# Patient Record
Sex: Female | Born: 1960
Health system: Southern US, Community
[De-identification: ages and names within clinical notes are randomized; demographics above are authoritative.]

## PROBLEM LIST (undated history)

## (undated) DIAGNOSIS — J069 Acute upper respiratory infection, unspecified: Secondary | ICD-10-CM

## (undated) DIAGNOSIS — F419 Anxiety disorder, unspecified: Secondary | ICD-10-CM

## (undated) DIAGNOSIS — R002 Palpitations: Secondary | ICD-10-CM

## (undated) DIAGNOSIS — Z87442 Personal history of urinary calculi: Secondary | ICD-10-CM

## (undated) DIAGNOSIS — Z9109 Other allergy status, other than to drugs and biological substances: Secondary | ICD-10-CM

## (undated) DIAGNOSIS — I809 Phlebitis and thrombophlebitis of unspecified site: Secondary | ICD-10-CM

## (undated) DIAGNOSIS — O2230 Deep phlebothrombosis in pregnancy, unspecified trimester: Secondary | ICD-10-CM

## (undated) DIAGNOSIS — G40909 Epilepsy, unspecified, not intractable, without status epilepticus: Secondary | ICD-10-CM

## (undated) DIAGNOSIS — R51 Headache: Secondary | ICD-10-CM

## (undated) DIAGNOSIS — K219 Gastro-esophageal reflux disease without esophagitis: Secondary | ICD-10-CM

## (undated) DIAGNOSIS — F32A Depression, unspecified: Secondary | ICD-10-CM

## (undated) DIAGNOSIS — Z973 Presence of spectacles and contact lenses: Secondary | ICD-10-CM

## (undated) DIAGNOSIS — F329 Major depressive disorder, single episode, unspecified: Secondary | ICD-10-CM

## (undated) DIAGNOSIS — M1990 Unspecified osteoarthritis, unspecified site: Secondary | ICD-10-CM

## (undated) DIAGNOSIS — F4310 Post-traumatic stress disorder, unspecified: Secondary | ICD-10-CM

## (undated) DIAGNOSIS — G5603 Carpal tunnel syndrome, bilateral upper limbs: Secondary | ICD-10-CM

## (undated) DIAGNOSIS — M549 Dorsalgia, unspecified: Secondary | ICD-10-CM

## (undated) DIAGNOSIS — G8929 Other chronic pain: Secondary | ICD-10-CM

## (undated) DIAGNOSIS — R5383 Other fatigue: Secondary | ICD-10-CM

## (undated) DIAGNOSIS — I341 Nonrheumatic mitral (valve) prolapse: Secondary | ICD-10-CM

## (undated) DIAGNOSIS — I319 Disease of pericardium, unspecified: Secondary | ICD-10-CM

## (undated) DIAGNOSIS — J189 Pneumonia, unspecified organism: Secondary | ICD-10-CM

## (undated) DIAGNOSIS — M4807 Spinal stenosis, lumbosacral region: Secondary | ICD-10-CM

## (undated) DIAGNOSIS — R519 Headache, unspecified: Secondary | ICD-10-CM

## (undated) DIAGNOSIS — L509 Urticaria, unspecified: Secondary | ICD-10-CM

## (undated) HISTORY — PX: TUBAL LIGATION: SHX77

## (undated) HISTORY — PX: OVARY SURGERY: SHX727

## (undated) HISTORY — DX: Post-traumatic stress disorder, unspecified: F43.10

## (undated) HISTORY — DX: Anxiety disorder, unspecified: F41.9

## (undated) HISTORY — DX: Depression, unspecified: F32.A

## (undated) HISTORY — DX: Disease of pericardium, unspecified: I31.9

## (undated) HISTORY — PX: NOSE SURGERY: SHX723

## (undated) HISTORY — DX: Urticaria, unspecified: L50.9

## (undated) HISTORY — PX: UTERINE FIBROID SURGERY: SHX826

## (undated) HISTORY — PX: APPENDECTOMY: SHX54

## (undated) HISTORY — PX: RHINOPLASTY: SUR1284

## (undated) HISTORY — DX: Acute upper respiratory infection, unspecified: J06.9

## (undated) HISTORY — PX: BREAST LUMPECTOMY: SHX2

## (undated) HISTORY — DX: Other fatigue: R53.83

## (undated) HISTORY — PX: ABDOMINAL HYSTERECTOMY: SHX81

## (undated) HISTORY — PX: BIOPSY BREAST: PRO8

## (undated) HISTORY — DX: Major depressive disorder, single episode, unspecified: F32.9

---

## 2006-12-12 ENCOUNTER — Encounter: Payer: Self-pay | Admitting: Family Medicine

## 2011-02-04 ENCOUNTER — Encounter: Payer: Self-pay | Admitting: *Deleted

## 2011-02-04 ENCOUNTER — Emergency Department (HOSPITAL_COMMUNITY)
Admission: EM | Admit: 2011-02-04 | Discharge: 2011-02-05 | Disposition: A | Discharge status: Home / Self Care | Payer: Self-pay | Attending: Emergency Medicine | Admitting: Emergency Medicine

## 2011-02-04 DIAGNOSIS — K5900 Constipation, unspecified: Secondary | ICD-10-CM | POA: Insufficient documentation

## 2011-02-04 DIAGNOSIS — R0602 Shortness of breath: Secondary | ICD-10-CM | POA: Insufficient documentation

## 2011-02-04 DIAGNOSIS — R062 Wheezing: Secondary | ICD-10-CM | POA: Insufficient documentation

## 2011-02-04 DIAGNOSIS — F17200 Nicotine dependence, unspecified, uncomplicated: Secondary | ICD-10-CM | POA: Insufficient documentation

## 2011-02-04 DIAGNOSIS — R112 Nausea with vomiting, unspecified: Secondary | ICD-10-CM | POA: Insufficient documentation

## 2011-02-04 DIAGNOSIS — R05 Cough: Secondary | ICD-10-CM | POA: Insufficient documentation

## 2011-02-04 DIAGNOSIS — R51 Headache: Secondary | ICD-10-CM | POA: Insufficient documentation

## 2011-02-04 DIAGNOSIS — R059 Cough, unspecified: Secondary | ICD-10-CM | POA: Insufficient documentation

## 2011-02-04 DIAGNOSIS — J40 Bronchitis, not specified as acute or chronic: Secondary | ICD-10-CM | POA: Insufficient documentation

## 2011-02-04 DIAGNOSIS — R509 Fever, unspecified: Secondary | ICD-10-CM | POA: Insufficient documentation

## 2011-02-04 NOTE — ED Notes (Signed)
Headache since this am with vomiting and an elevated temp.she had advil 1930

## 2011-02-05 ENCOUNTER — Emergency Department (HOSPITAL_COMMUNITY): Payer: Self-pay

## 2011-02-05 LAB — DIFFERENTIAL
Basophils Relative: 0 % (ref 0–1)
Monocytes Relative: 12 % (ref 3–12)
Neutro Abs: 7.4 10*3/uL (ref 1.7–7.7)
Neutrophils Relative %: 73 % (ref 43–77)

## 2011-02-05 LAB — CBC
Hemoglobin: 12.2 g/dL (ref 12.0–15.0)
MCHC: 34.5 g/dL (ref 30.0–36.0)
RBC: 3.71 MIL/uL — ABNORMAL LOW (ref 3.87–5.11)
WBC: 10.1 10*3/uL (ref 4.0–10.5)

## 2011-02-05 MED ORDER — IPRATROPIUM BROMIDE 0.02 % IN SOLN
0.5000 mg | Freq: Once | RESPIRATORY_TRACT | Status: AC
  Administered 2011-02-05: 0.5 mg via RESPIRATORY_TRACT
  Filled 2011-02-05: qty 2.5

## 2011-02-05 MED ORDER — ALBUTEROL SULFATE HFA 108 (90 BASE) MCG/ACT IN AERS
2.0000 | INHALATION_SPRAY | RESPIRATORY_TRACT | Status: DC | PRN
  Administered 2011-02-05: 2 via RESPIRATORY_TRACT
  Filled 2011-02-05: qty 6.7

## 2011-02-05 MED ORDER — SODIUM CHLORIDE 0.9 % IV BOLUS (SEPSIS)
1000.0000 mL | Freq: Once | INTRAVENOUS | Status: AC
  Administered 2011-02-05: 1000 mL via INTRAVENOUS

## 2011-02-05 MED ORDER — AZITHROMYCIN 250 MG PO TABS: ORAL_TABLET | ORAL | Status: AC

## 2011-02-05 MED ORDER — ALBUTEROL SULFATE (5 MG/ML) 0.5% IN NEBU
5.0000 mg | INHALATION_SOLUTION | Freq: Once | RESPIRATORY_TRACT | Status: AC
  Administered 2011-02-05: 5 mg via RESPIRATORY_TRACT
  Filled 2011-02-05: qty 1

## 2011-02-05 MED ORDER — ONDANSETRON HCL 4 MG/2ML IJ SOLN
4.0000 mg | Freq: Once | INTRAMUSCULAR | Status: AC
  Administered 2011-02-05: 4 mg via INTRAVENOUS
  Filled 2011-02-05: qty 2

## 2011-02-05 NOTE — ED Provider Notes (Signed)
History     CSN: [id number]  Arrival date & time 02/04/11  2233   First MD Initiated Contact with Patient 02/05/11 0134      Chief Complaint  Patient presents with  . Emesis     HPI  History provided by the patient. Patient is a 50 year old female with history of mitral valve prolapse who presents with complaints of gradually increasing cough, shortness of breath, headache that began earlier yesterday morning. Symptoms were associated with some episodes of nausea and vomiting. Fever of 101 it was improved with Advil. She states she has been unable to keep down any food or fluids. She has not taken any medications for her symptoms besides 1 Advil. She does not know of any sick contacts. She has no recent travel or long trips. She has no hemoptysis. She has no other significant past medical history.   Past Medical History  Diagnosis Date  . Mild mitral valve prolapse     History reviewed. No pertinent past surgical history.  History reviewed. No pertinent family history.  History  Substance Use Topics  . Smoking status: Current Everyday Smoker  . Smokeless tobacco: Not on file  . Alcohol Use: Yes    OB History    Grav Para Term Preterm Abortions TAB SAB Ect Mult Living                  Review of Systems  Constitutional: Positive for fever and chills.  HENT: Negative for congestion and rhinorrhea.   Respiratory: Positive for cough and shortness of breath.   Cardiovascular: Negative for chest pain.  Gastrointestinal: Positive for nausea, vomiting and constipation. Negative for abdominal pain and diarrhea.  Neurological: Positive for headaches.  All other systems reviewed and are negative.    Allergies  Aspirin; Dilantin; Food; Ivp dye; and Sulfa drugs cross reactors  Home Medications   Current Outpatient Rx  Name Route Sig Dispense Refill  . ALPRAZOLAM 1 MG PO TABS Oral Take 1 mg by mouth 3 (three) times daily. To slow down heart rate and calm nerves     .  HYDROCODONE-ACETAMINOPHEN 10-325 MG PO TABS Oral Take 1 tablet by mouth every 6 (six) hours as needed. For pain     . METOPROLOL SUCCINATE ER 50 MG PO TB24 Oral Take 50 mg by mouth daily. To treat heart failure condition and rapid heart beat       BP 82/51  Pulse 80  Temp(Src) 97.6 F (36.4 C) (Oral)  Resp 18  SpO2 98%  Physical Exam  Nursing note and vitals reviewed. Constitutional: She is oriented to person, place, and time. She appears well-developed and well-nourished. No distress.  HENT:  Head: Normocephalic and atraumatic.  Mouth/Throat: Oropharynx is clear and moist.  Eyes: Conjunctivae and EOM are normal. Pupils are equal, round, and reactive to light.  Neck: Normal range of motion. Neck supple.       No meningeal signs  Cardiovascular: Normal rate and regular rhythm.   No murmur heard. Pulmonary/Chest: Effort normal. No respiratory distress. She has wheezes. She has no rales. She exhibits no tenderness.  Abdominal: Soft. She exhibits no distension. There is no tenderness. There is no rebound and no guarding.  Musculoskeletal: She exhibits no edema and no tenderness.  Lymphadenopathy:    She has no cervical adenopathy.  Neurological: She is alert and oriented to person, place, and time.  Skin: Skin is warm and dry. No rash noted.  Psychiatric: She has a normal mood and  affect. Her behavior is normal.    ED Course  Procedures (including critical care time)   Labs Reviewed  CBC  DIFFERENTIAL  I-STAT, CHEM 8   Results for orders placed during the hospital encounter of 02/04/11  CBC      Component Value Range   WBC 10.1  4.0 - 10.5 (K/uL)   RBC 3.71 (*) 3.87 - 5.11 (MIL/uL)   Hemoglobin 12.2  12.0 - 15.0 (g/dL)   HCT 41.3 (*) 24.4 - 46.0 (%)   MCV 95.4  78.0 - 100.0 (fL)   MCH 32.9  26.0 - 34.0 (pg)   MCHC 34.5  30.0 - 36.0 (g/dL)   RDW 01.0  27.2 - 53.6 (%)   Platelets 157  150 - 400 (K/uL)  DIFFERENTIAL      Component Value Range   Neutrophils Relative 73   43 - 77 (%)   Neutro Abs 7.4  1.7 - 7.7 (K/uL)   Lymphocytes Relative 14  12 - 46 (%)   Lymphs Abs 1.5  0.7 - 4.0 (K/uL)   Monocytes Relative 12  3 - 12 (%)   Monocytes Absolute 1.2 (*) 0.1 - 1.0 (K/uL)   Eosinophils Relative 0  0 - 5 (%)   Eosinophils Absolute 0.0  0.0 - 0.7 (K/uL)   Basophils Relative 0  0 - 1 (%)   Basophils Absolute 0.0  0.0 - 0.1 (K/uL)    I-STAT results did not crossover in the computer. Results are as follows:  Sodium 139, potassium 3.6, chloride 106, calcium 1.10, CO2 25, glucose 113, BUN 13, creatinine 0.8, hematocrit 37%, hemoglobin 12.6    Dg Chest 2 View  02/05/2011  *RADIOLOGY REPORT*  Clinical Data: Cough.  Fever.  Rule out pneumonia  CHEST - 2 VIEW  Comparison: None.  Findings: Heart size is normal.  No pleural effusion or pulmonary edema.  No airspace consolidation identified.  Mildly coarsened interstitial markings noted.  Review of the visualized osseous structures is negative.  IMPRESSION:  1.  No acute cardiopulmonary abnormalities.  Original Report Authenticated By: Rosealee Albee, M.D.     1. Bronchitis       MDM  1:30 AM patient seen and evaluated. Patient in no acute distress.   2:30 AM patient reports feeling much better after breathing treatment. She still has slight wheezing but noticeable improvement.   Patient tolerating by mouth fluids without nausea or vomiting. Patient reports that she has a baseline low blood pressures with systolic pressure is usually around 98. She denies any lightheaded or having near syncope.   Phill Mutter Friend, [place] 02/05/11 [phone]

## 2011-02-05 NOTE — ED Provider Notes (Signed)
Medical screening examination/treatment/procedure(s) were performed by non-physician practitioner and as supervising physician I was immediately available for consultation/collaboration.   Lyanne Co, MD 02/05/11 [phone]

## 2011-02-05 NOTE — ED Notes (Addendum)
Pt stated that she has been having Chest tightness, n/v since this morning. No signs of respiratory or cardiac distress at this time. Complaining of nausea. Lung sounds clear. Heart sounds heard and WNL.

## 2011-02-05 NOTE — ED Notes (Signed)
MD made aware of SBP being in 80s. Bolus given per orders. Pt given  Water for fluid challenge given. No nausea/vomiting at this time. Will continue to monitor.

## 2011-02-05 NOTE — ED Notes (Signed)
Report given to [facility] in [place]. No questions or concerns by RN at this time.

## 2011-02-08 LAB — POCT I-STAT, CHEM 8
Chloride: 106 mEq/L (ref 96–112)
Glucose, Bld: 113 mg/dL — ABNORMAL HIGH (ref 70–99)
HCT: 37 % (ref 36.0–46.0)
Potassium: 3.6 mEq/L (ref 3.5–5.1)

## 2011-03-02 ENCOUNTER — Emergency Department (INDEPENDENT_AMBULATORY_CARE_PROVIDER_SITE_OTHER)
Admission: EM | Admit: 2011-03-02 | Discharge: 2011-03-02 | Disposition: A | Discharge status: Home / Self Care | Payer: Self-pay | Source: Home / Self Care | Attending: Family Medicine | Admitting: Family Medicine

## 2011-03-02 ENCOUNTER — Encounter (HOSPITAL_COMMUNITY): Payer: Self-pay | Admitting: Emergency Medicine

## 2011-03-02 DIAGNOSIS — M549 Dorsalgia, unspecified: Secondary | ICD-10-CM

## 2011-03-02 DIAGNOSIS — J45901 Unspecified asthma with (acute) exacerbation: Secondary | ICD-10-CM

## 2011-03-02 DIAGNOSIS — I341 Nonrheumatic mitral (valve) prolapse: Secondary | ICD-10-CM

## 2011-03-02 DIAGNOSIS — F17210 Nicotine dependence, cigarettes, uncomplicated: Secondary | ICD-10-CM

## 2011-03-02 DIAGNOSIS — I059 Rheumatic mitral valve disease, unspecified: Secondary | ICD-10-CM

## 2011-03-02 DIAGNOSIS — G8929 Other chronic pain: Secondary | ICD-10-CM

## 2011-03-02 DIAGNOSIS — F17200 Nicotine dependence, unspecified, uncomplicated: Secondary | ICD-10-CM

## 2011-03-02 HISTORY — DX: Phlebitis and thrombophlebitis of unspecified site: I80.9

## 2011-03-02 HISTORY — DX: Dorsalgia, unspecified: M54.9

## 2011-03-02 HISTORY — DX: Other chronic pain: G89.29

## 2011-03-02 MED ORDER — HYDROCODONE-ACETAMINOPHEN 10-325 MG PO TABS: 1.0000 | ORAL_TABLET | Freq: Two times a day (BID) | ORAL | Status: DC

## 2011-03-02 MED ORDER — ALPRAZOLAM 1 MG PO TABS: 1.0000 mg | ORAL_TABLET | Freq: Three times a day (TID) | ORAL | Status: DC | PRN

## 2011-03-02 MED ORDER — METOPROLOL SUCCINATE ER 25 MG PO TB24: 50.0000 mg | ORAL_TABLET | Freq: Every day | ORAL | Status: DC

## 2011-03-02 MED ORDER — ALBUTEROL SULFATE HFA 108 (90 BASE) MCG/ACT IN AERS: 1.0000 | INHALATION_SPRAY | Freq: Four times a day (QID) | RESPIRATORY_TRACT | Status: DC | PRN

## 2011-03-02 NOTE — ED Notes (Signed)
Arrived in November from [place].  Reports has contacted her physician in [place], but unable to call in medicines.  Patient has been to irc center .  Patient does not have pcp locally

## 2011-03-02 NOTE — ED Provider Notes (Signed)
History     CSN: [id number]  Arrival date & time 03/02/11  1104   First MD Initiated Contact with Patient 03/02/11 1138      Chief Complaint  Patient presents with  . Medication Refill    (Consider location/radiation/quality/duration/timing/severity/associated sxs/prior treatment) Patient is a 51 y.o. female presenting with back pain. The history is provided by the patient.  Back Pain  This is a chronic problem. The problem occurs constantly. The pain is associated with no known injury. The quality of the pain is described as shooting. The pain does not radiate. The pain is moderate.    Past Medical History  Diagnosis Date  . Mild mitral valve prolapse   . Asthma   . Back pain, chronic   . Thrombophlebitis     Past Surgical History  Procedure Date  . Back surgery   . Nose surgery     s/p trauma  . Appendectomy   . Cesarean section   . Ovary surgery   . Uterine fibroid surgery   . Breast lumpectomy     No family history on file.  History  Substance Use Topics  . Smoking status: Current Everyday Smoker  . Smokeless tobacco: Not on file  . Alcohol Use: No    OB History    Grav Para Term Preterm Abortions TAB SAB Ect Mult Living                  Review of Systems  Cardiovascular: Positive for palpitations.  Musculoskeletal: Positive for back pain.  Psychiatric/Behavioral: The patient is nervous/anxious.     Allergies  Aspirin; Dilantin; Food; Ivp dye; and Sulfa drugs cross reactors  Home Medications   Current Outpatient Rx  Name Route Sig Dispense Refill  . VENTOLIN HFA IN Inhalation Inhale into the lungs.    Marland Kitchen METOPROLOL SUCCINATE ER 50 MG PO TB24 Oral Take 50 mg by mouth daily. To treat heart failure condition and rapid heart beat     . ALBUTEROL SULFATE HFA 108 (90 BASE) MCG/ACT IN AERS Inhalation Inhale 1-2 puffs into the lungs every 6 (six) hours as needed for wheezing. 1 Inhaler 0  . ALPRAZOLAM 1 MG PO TABS Oral Take 1 mg by mouth 3 (three)  times daily. To slow down heart rate and calm nerves     . ALPRAZOLAM 1 MG PO TABS Oral Take 1 tablet (1 mg total) by mouth 3 (three) times daily as needed for sleep. 30 tablet 0  . HYDROCODONE-ACETAMINOPHEN 10-325 MG PO TABS Oral Take 1 tablet by mouth 2 times daily at 12 noon and 4 pm. For pain    . HYDROCODONE-ACETAMINOPHEN 10-325 MG PO TABS Oral Take 1 tablet by mouth 2 times daily at 12 noon and 4 pm. 20 tablet 0  . METOPROLOL SUCCINATE ER 25 MG PO TB24 Oral Take 2 tablets (50 mg total) by mouth daily. 60 tablet 0    BP 101/68  Pulse 78  Temp(Src) 97.8 F (36.6 C) (Oral)  Resp 18  SpO2 100%  Physical Exam  Nursing note and vitals reviewed. Constitutional: She is oriented to person, place, and time. She appears well-developed and well-nourished.  Cardiovascular: Normal rate, regular rhythm and intact distal pulses.   Neurological: She is alert and oriented to person, place, and time.  Skin: Skin is warm and dry.  Psychiatric: She has a normal mood and affect.    ED Course  Procedures (including critical care time)  Labs Reviewed - No data to display  No results found.   1. Chronic back pain   2. Mitral valve prolapse syndrome   3. Cigarette smoker   4. Asthma exacerbation, mild       MDM          Barkley Bruns, MD 03/02/11 1301

## 2011-03-11 ENCOUNTER — Emergency Department (INDEPENDENT_AMBULATORY_CARE_PROVIDER_SITE_OTHER)
Admission: EM | Admit: 2011-03-11 | Discharge: 2011-03-11 | Disposition: A | Discharge status: Home / Self Care | Payer: Self-pay | Source: Home / Self Care

## 2011-03-11 ENCOUNTER — Encounter (HOSPITAL_COMMUNITY): Payer: Self-pay | Admitting: Emergency Medicine

## 2011-03-11 DIAGNOSIS — I059 Rheumatic mitral valve disease, unspecified: Secondary | ICD-10-CM

## 2011-03-11 DIAGNOSIS — M545 Low back pain: Secondary | ICD-10-CM

## 2011-03-11 DIAGNOSIS — I341 Nonrheumatic mitral (valve) prolapse: Secondary | ICD-10-CM

## 2011-03-11 DIAGNOSIS — G8929 Other chronic pain: Secondary | ICD-10-CM

## 2011-03-11 MED ORDER — ALPRAZOLAM 1 MG PO TABS
1.0000 mg | ORAL_TABLET | Freq: Three times a day (TID) | ORAL | Status: DC | PRN
Start: 2011-03-11 — End: 2011-03-17

## 2011-03-11 MED ORDER — HYDROCODONE-ACETAMINOPHEN 10-325 MG PO TABS: 1.0000 | ORAL_TABLET | Freq: Two times a day (BID) | ORAL | Status: DC

## 2011-03-11 NOTE — ED Notes (Signed)
Patient needing refills of medication.  Patient will not get orange card until April 07, 2011.
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
## 2011-03-12 NOTE — ED Provider Notes (Signed)
Medical screening examination/treatment/procedure(s) were performed by non-physician practitioner and as supervising physician I was immediately available for consultation/collaboration.  Luiz Blare MD   Luiz Blare, MD 03/12/11 [phone]

## 2011-03-17 ENCOUNTER — Ambulatory Visit (INDEPENDENT_AMBULATORY_CARE_PROVIDER_SITE_OTHER): Payer: Self-pay | Admitting: Family Medicine

## 2011-03-17 ENCOUNTER — Ambulatory Visit: Payer: Self-pay

## 2011-03-17 ENCOUNTER — Emergency Department (HOSPITAL_COMMUNITY): Admission: EM | Admit: 2011-03-17 | Discharge: 2011-03-17 | Discharge status: Against Medical Advice | Payer: Self-pay

## 2011-03-17 VITALS — BP 102/70 | HR 74 | Temp 98.2°F | Resp 16 | Ht 63.0 in | Wt 141.0 lb

## 2011-03-17 DIAGNOSIS — N6049 Mammary duct ectasia of unspecified breast: Secondary | ICD-10-CM

## 2011-03-17 DIAGNOSIS — M545 Low back pain: Secondary | ICD-10-CM

## 2011-03-17 DIAGNOSIS — N23 Unspecified renal colic: Secondary | ICD-10-CM

## 2011-03-17 DIAGNOSIS — G8929 Other chronic pain: Secondary | ICD-10-CM

## 2011-03-17 DIAGNOSIS — R000 Tachycardia, unspecified: Secondary | ICD-10-CM

## 2011-03-17 MED ORDER — CEPHALEXIN 500 MG PO CAPS: 500.0000 mg | ORAL_CAPSULE | Freq: Three times a day (TID) | ORAL | Status: AC

## 2011-03-17 MED ORDER — HYDROCODONE-ACETAMINOPHEN 10-325 MG PO TABS: 1.0000 | ORAL_TABLET | Freq: Two times a day (BID) | ORAL | Status: AC

## 2011-03-17 MED ORDER — METOPROLOL SUCCINATE ER 50 MG PO TB24: 50.0000 mg | ORAL_TABLET | Freq: Every day | ORAL | Status: DC

## 2011-03-17 MED ORDER — ALPRAZOLAM 1 MG PO TABS: 1.0000 mg | ORAL_TABLET | Freq: Three times a day (TID) | ORAL | Status: DC | PRN
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
## 2011-03-17 NOTE — Progress Notes (Signed)
  Subjective:    Patient ID: Sylvia Hall, female    DOB: [birth year], 51 y.o.   MRN: [id number]  HPI    Review of Systems     Objective:   Physical Exam        Assessment & Plan:  refils - see rxs  URI - likely viral  Periductal mastitis - keflex 500 TID for 10 days.  If does not resolve, must return for mammo/ultrasound.

## 2011-04-14 ENCOUNTER — Ambulatory Visit (INDEPENDENT_AMBULATORY_CARE_PROVIDER_SITE_OTHER): Payer: Self-pay | Admitting: Internal Medicine

## 2011-04-14 ENCOUNTER — Ambulatory Visit: Payer: Self-pay

## 2011-04-14 VITALS — BP 100/66 | HR 85 | Temp 98.1°F | Resp 16 | Ht 63.0 in | Wt 144.8 lb

## 2011-04-14 DIAGNOSIS — J019 Acute sinusitis, unspecified: Secondary | ICD-10-CM

## 2011-04-14 DIAGNOSIS — R05 Cough: Secondary | ICD-10-CM

## 2011-04-14 DIAGNOSIS — F419 Anxiety disorder, unspecified: Secondary | ICD-10-CM

## 2011-04-14 DIAGNOSIS — J42 Unspecified chronic bronchitis: Secondary | ICD-10-CM

## 2011-04-14 DIAGNOSIS — M549 Dorsalgia, unspecified: Secondary | ICD-10-CM

## 2011-04-14 DIAGNOSIS — I341 Nonrheumatic mitral (valve) prolapse: Secondary | ICD-10-CM

## 2011-04-14 LAB — POCT CBC
Granulocyte percent: 57.3 %G (ref 37–80)
HCT, POC: 40.2 % (ref 37.7–47.9)
MID (cbc): 0.4 (ref 0–0.9)
MPV: 8.3 fL (ref 0–99.8)
POC LYMPH PERCENT: 35.6 %L (ref 10–50)
WBC: 5.7 10*3/uL (ref 4.6–10.2)

## 2011-04-14 MED ORDER — ALPRAZOLAM 1 MG PO TABS: 1.0000 mg | ORAL_TABLET | Freq: Three times a day (TID) | ORAL | Status: DC | PRN

## 2011-04-14 MED ORDER — METOPROLOL SUCCINATE ER 50 MG PO TB24: 50.0000 mg | ORAL_TABLET | Freq: Every day | ORAL | Status: DC

## 2011-04-14 MED ORDER — AZITHROMYCIN 500 MG PO TABS: 500.0000 mg | ORAL_TABLET | Freq: Every day | ORAL | Status: DC

## 2011-04-14 MED ORDER — HYDROCODONE-ACETAMINOPHEN 10-325 MG PO TABS: 1.0000 | ORAL_TABLET | Freq: Three times a day (TID) | ORAL | Status: DC | PRN

## 2011-04-14 MED ORDER — AZITHROMYCIN 500 MG PO TABS: 500.0000 mg | ORAL_TABLET | Freq: Every day | ORAL | Status: AC

## 2011-04-14 NOTE — Progress Notes (Signed)
  Subjective:    Patient ID: Sylvia Hall, female    DOB: Mar 17, 1960, 51 y.o.   MRN: [id number]  HPI cough sinus drainage chills for one week. [facility] for [facility]. Still coughing, yellow and green sputum. Smoker. Also need renewals of meds.cough moderate in severity.    Review of Systems  Constitutional: Positive for fatigue.  HENT: Positive for rhinorrhea, sneezing and postnasal drip.   Eyes: Negative.   Respiratory: Positive for cough.   Cardiovascular: Negative.   Gastrointestinal: Negative.   Genitourinary: Negative.   Skin: Negative.   Neurological: Negative.   Hematological: Negative.   Psychiatric/Behavioral: Negative.   All other systems reviewed and are negative.       Objective:   Physical Exam  Nursing note and vitals reviewed. Constitutional: She is oriented to person, place, and time. She appears well-developed and well-nourished.  HENT:  Head: Normocephalic and atraumatic.  Right Ear: External ear normal.  Left Ear: External ear normal.       Tender percussion of frontal and maxillary sinuses  Eyes: Conjunctivae and EOM are normal. Pupils are equal, round, and reactive to light.  Neck: Normal range of motion. Neck supple.  Pulmonary/Chest: Effort normal.       Cough bs no wheezes  Abdominal: Soft. Bowel sounds are normal.  Musculoskeletal: Normal range of motion.  Neurological: She is alert and oriented to person, place, and time.  Skin: Skin is warm and dry.  Psychiatric: She has a normal mood and affect. Her behavior is normal. Thought content normal.      Results for orders placed in visit on 04/14/11  POCT CBC      Component Value Range   WBC 5.7  4.6 - 10.2 (K/uL)   Lymph, poc 2.0  0.6 - 3.4    POC LYMPH PERCENT 35.6  10 - 50 (%L)   MID (cbc) 0.4  0 - 0.9    POC MID % 7.1  0 - 12 (%M)   POC Granulocyte 3.3  2 - 6.9    Granulocyte percent 57.3  37 - 80 (%G)   RBC 4.21  4.04 - 5.48 (M/uL)   Hemoglobin 13.1  12.2 -  16.2 (g/dL)   HCT, POC 91.4  78.2 - 47.9 (%)   MCV 95.6  80 - 97 (fL)   MCH, POC 31.1  27 - 31.2 (pg)   MCHC 32.6  31.8 - 35.4 (g/dL)   RDW, POC 95.6     Platelet Count, POC 215  142 - 424 (K/uL)   MPV 8.3  0 - 99.8 (fL)     [facility] reading (PRIMARY) by  Dr. Mindi Junker no infiltrate.  Assessment & Plan:  bronchitus sinusitus. Had pneumonia will recheck for resolution. Plan antibiotics.meds renewal refer to primary care at [facility]

## 2011-04-14 NOTE — Progress Notes (Signed)
Addended by: Glennie Isle on: 04/14/2011 08:00 PM   Modules accepted: Orders

## 2011-04-15 ENCOUNTER — Telehealth: Payer: Self-pay | Admitting: Radiology

## 2011-04-15 DIAGNOSIS — M549 Dorsalgia, unspecified: Secondary | ICD-10-CM

## 2011-04-15 NOTE — Telephone Encounter (Signed)
PT CALLED--SHE SAYS HER HYDROCODONE AMOUNT NEEDS TO BE 60 PILLS, NOT THIRTY. SHE TAKES 2 PER DAY. CAN WE CHANGE THIS?

## 2011-04-16 MED ORDER — HYDROCODONE-ACETAMINOPHEN 10-325 MG PO TABS: 1.0000 | ORAL_TABLET | Freq: Two times a day (BID) | ORAL | Status: DC

## 2011-04-16 NOTE — Telephone Encounter (Signed)
Spoke to [employer] because pt picked up RX we cannot change amount but pt can take bid - will need an ov before she runs out to determine if we can continue to refill her pain meds monthly or whether she should be referred to pain clinic.

## 2011-04-17 ENCOUNTER — Emergency Department (HOSPITAL_COMMUNITY): Payer: Self-pay

## 2011-04-17 ENCOUNTER — Encounter (HOSPITAL_COMMUNITY): Payer: Self-pay | Admitting: *Deleted

## 2011-04-17 ENCOUNTER — Emergency Department (HOSPITAL_COMMUNITY)
Admission: EM | Admit: 2011-04-17 | Discharge: 2011-04-17 | Disposition: A | Discharge status: Home / Self Care | Payer: Self-pay | Attending: Emergency Medicine | Admitting: Emergency Medicine

## 2011-04-17 DIAGNOSIS — J45909 Unspecified asthma, uncomplicated: Secondary | ICD-10-CM | POA: Insufficient documentation

## 2011-04-17 DIAGNOSIS — M25529 Pain in unspecified elbow: Secondary | ICD-10-CM | POA: Insufficient documentation

## 2011-04-17 DIAGNOSIS — S5000XA Contusion of unspecified elbow, initial encounter: Secondary | ICD-10-CM | POA: Insufficient documentation

## 2011-04-17 DIAGNOSIS — W08XXXA Fall from other furniture, initial encounter: Secondary | ICD-10-CM | POA: Insufficient documentation

## 2011-04-17 DIAGNOSIS — T148XXA Other injury of unspecified body region, initial encounter: Secondary | ICD-10-CM

## 2011-04-17 DIAGNOSIS — W19XXXA Unspecified fall, initial encounter: Secondary | ICD-10-CM

## 2011-04-17 DIAGNOSIS — Y93E9 Activity, other interior property and clothing maintenance: Secondary | ICD-10-CM | POA: Insufficient documentation

## 2011-04-17 DIAGNOSIS — I059 Rheumatic mitral valve disease, unspecified: Secondary | ICD-10-CM | POA: Insufficient documentation

## 2011-04-17 HISTORY — DX: Epilepsy, unspecified, not intractable, without status epilepticus: G40.909

## 2011-04-17 HISTORY — DX: Nonrheumatic mitral (valve) prolapse: I34.1

## 2011-04-17 MED ORDER — OXYCODONE-ACETAMINOPHEN 5-325 MG PO TABS
1.0000 | ORAL_TABLET | Freq: Once | ORAL | Status: AC
  Administered 2011-04-17: 1 via ORAL
  Filled 2011-04-17: qty 1

## 2011-04-17 MED ORDER — OXYCODONE-ACETAMINOPHEN 5-325 MG PO TABS: 1.0000 | ORAL_TABLET | ORAL | Status: AC | PRN

## 2011-04-17 MED ORDER — IBUPROFEN 800 MG PO TABS
800.0000 mg | ORAL_TABLET | Freq: Once | ORAL | Status: AC
  Administered 2011-04-17: 800 mg via ORAL
  Filled 2011-04-17: qty 1

## 2011-04-17 MED ORDER — ACETAMINOPHEN 325 MG PO TABS: 650.0000 mg | ORAL_TABLET | Freq: Once | ORAL | Status: DC

## 2011-04-17 NOTE — ED Notes (Signed)
Pt fell from standing on a small table, landed on R elbow. Presents w/ swelling, bruising, pain.

## 2011-04-17 NOTE — Telephone Encounter (Signed)
Called pt advised to RTC but pt has appt at [facility] on April 6 and will run out of her meds. Can we just refill this? Give her another 84?

## 2011-04-17 NOTE — ED Provider Notes (Signed)
History     CSN: [id number]  Arrival date & time 04/17/11  4098   First MD Initiated Contact with Patient 04/17/11 2251      Chief Complaint  Patient presents with  . Fall    R elbow pain, bruising, swelling    (Consider location/radiation/quality/duration/timing/severity/associated sxs/prior treatment) Patient is a 51 y.o. female presenting with fall. The history is provided by the patient. No language interpreter was used.  Fall The accident occurred 6 to 12 hours ago. Incident: fell off dresser while cleaning. She fell from a height of 3 to 5 ft. She landed on a hard floor. Point of impact: R elbow. The pain is present in the right elbow. The pain is at a severity of 8/10. The pain is moderate. She was ambulatory at the scene. There was no entrapment after the fall. There was no drug use involved in the accident. There was no alcohol use involved in the accident. Pertinent negatives include no fever, no numbness, no abdominal pain, no bowel incontinence, no nausea, no vomiting, no hematuria, no headaches, no hearing loss, no loss of consciousness and no tingling. The symptoms are aggravated by activity and flexion.  Reports that she was cleaning standing on her dresser and fell from the dresser.  R elbow pain and bruising. pmh of asthma, back pain, mvp and epilepsy.  Past Medical History  Diagnosis Date  . Mild mitral valve prolapse   . Asthma   . Back pain, chronic   . Thrombophlebitis   . Mitral valve prolapse   . Epilepsy     Past Surgical History  Procedure Date  . Back surgery   . Nose surgery     s/p trauma  . Appendectomy   . Cesarean section   . Ovary surgery   . Uterine fibroid surgery   . Breast lumpectomy     History reviewed. No pertinent family history.  History  Substance Use Topics  . Smoking status: Current Everyday Smoker -- 1.0 packs/day for 12 years    Types: Cigarettes  . Smokeless tobacco: Not on file  . Alcohol Use: No    OB History    Grav Para Term Preterm Abortions TAB SAB Ect Mult Living                  Review of Systems  Constitutional: Negative for fever.  Gastrointestinal: Negative for nausea, vomiting, abdominal pain and bowel incontinence.  Genitourinary: Negative for hematuria.  Musculoskeletal:       R elbow pain  Neurological: Negative for dizziness, tingling, loss of consciousness, weakness, light-headedness, numbness and headaches.    Allergies  Dilantin; Aspirin; Caffeine; Food; Ivp dye; Levaquin; and Sulfa drugs cross reactors  Home Medications   Current Outpatient Rx  Name Route Sig Dispense Refill  . ALBUTEROL SULFATE HFA 108 (90 BASE) MCG/ACT IN AERS Inhalation Inhale 2 puffs into the lungs every 6 (six) hours as needed. SHORTNESS OF BREATH    . ALPRAZOLAM 1 MG PO TABS Oral Take 1 mg by mouth 3 (three) times daily.    . AZITHROMYCIN 500 MG PO TABS Oral Take 1 tablet (500 mg total) by mouth daily. 5 tablet 0  . HYDROCODONE-ACETAMINOPHEN 10-325 MG PO TABS Oral Take 1 tablet by mouth 2 (two) times daily. 60 tablet 0  . METOPROLOL SUCCINATE ER 50 MG PO TB24 Oral Take 1 tablet (50 mg total) by mouth daily. 30 tablet 0  . RANITIDINE HCL 75 MG PO TABS Oral Take 75 mg  by mouth daily as needed. ACID REFLUX      BP 98/64  Pulse 84  Temp(Src) 98 F (36.7 C) (Oral)  Resp 16  SpO2 98%  Physical Exam  Nursing note and vitals reviewed. Constitutional: She is oriented to person, place, and time. She appears well-developed and well-nourished.  HENT:  Head: Normocephalic and atraumatic.  Eyes: Conjunctivae and EOM are normal. Pupils are equal, round, and reactive to light.  Neck: Normal range of motion. Neck supple.  Cardiovascular: Normal rate.   Pulmonary/Chest: Effort normal and breath sounds normal. No respiratory distress.  Abdominal: Soft.  Musculoskeletal: Normal range of motion. She exhibits tenderness. She exhibits no edema.       R elbow bruising and erythema  Neurological: She is alert  and oriented to person, place, and time. She has normal reflexes.  Skin: Skin is warm and dry.  Psychiatric: She has a normal mood and affect.    ED Course  Procedures (including critical care time)  Labs Reviewed - No data to display Dg Elbow Complete Right  04/17/2011  *RADIOLOGY REPORT*  Clinical Data: Right elbow pain status post fall.  RIGHT ELBOW - COMPLETE 3+ VIEW  Comparison: None.  Findings: No acute fracture or dislocation identified.  No joint effusion.  No aggressive osseous lesion.  IMPRESSION: No acute osseous abnormality identified. If clinical concern for a fracture persists, recommend a repeat radiograph in 5-10 days to evaluate for interval change or callus formation.  Original Report Authenticated By: Waneta Martins, M.D.     No diagnosis found.    MDM  R elbow pain after fall off of her dresser while cleaning.  X-ray shows no fracture but recommends a repeat in 5-10 days.  Sling provided, bruising noted.  Will follow up with ortho ice percocet and ibuprofen for pain.  No SOB, dizziness or presyncope.        Jethro Bastos, NP 04/20/11 1728  Medical screening examination/treatment/procedure(s) were performed by non-physician practitioner and as supervising physician I was immediately available for consultation/collaboration.  Sunnie Nielsen, MD 04/21/11 [phone]

## 2011-04-17 NOTE — Discharge Instructions (Signed)
Follow up with Dr. Madelon Lips as needed.  We recommend a follow up x-ray in 7-10 days if pain persists.  Do not drive with the percocet.  Take ibuprofen as well for pain. Wear the sling for comfort.    Contusion A contusion is a deep bruise. Contusions happen when an injury causes bleeding under the skin. Signs of bruising include pain, puffiness (swelling), and discolored skin. The contusion may turn blue, purple, or yellow. HOME CARE   Put ice on the injured area.   Put ice in a plastic bag.   Place a towel between your skin and the bag.   Leave the ice on for 15 to 20 minutes, 3 to 4 times a day.   Only take medicine as told by your doctor.   Rest the injured area.   If possible, raise (elevate) the injured area to lessen puffiness.  GET HELP RIGHT AWAY IF:   You have more bruising or puffiness.   You have pain that is getting worse.   Your puffiness or pain is not helped by medicine.  MAKE SURE YOU:   Understand these instructions.   Will watch your condition.   Will get help right away if you are not doing well or get worse.  Document Released: 07/14/2007 Document Revised: 01/14/2011 Document Reviewed: 11/30/2010 [facility] Patient Information 2012 [place], [place].Cryotherapy Cryotherapy means treatment with cold. Ice or gel packs can be used to reduce both pain and swelling. Ice is the most helpful within the first 24 to 48 hours after an injury or flareup from overusing a muscle or joint. Sprains, strains, spasms, burning pain, shooting pain, and aches can all be eased with ice. Ice can also be used when recovering from surgery. Ice is effective, has very few side effects, and is safe for most people to use. PRECAUTIONS  Ice is not a safe treatment option for people with:  Raynaud's phenomenon. This is a condition affecting small blood vessels in the extremities. Exposure to cold may cause your problems to return.   Cold hypersensitivity. There are many forms of cold  hypersensitivity, including:   Cold urticaria. Red, itchy hives appear on the skin when the tissues begin to warm after being iced.   Cold erythema. This is a red, itchy rash caused by exposure to cold.   Cold hemoglobinuria. Red blood cells break down when the tissues begin to warm after being iced. The hemoglobin that carry oxygen are passed into the urine because they cannot combine with blood proteins fast enough.   Numbness or altered sensitivity in the area being iced.  If you have any of the following conditions, do not use ice until you have discussed cryotherapy with your caregiver:  Heart conditions, such as arrhythmia, angina, or chronic heart disease.   High blood pressure.   Healing wounds or open skin in the area being iced.   Current infections.   Rheumatoid arthritis.   Poor circulation.   Diabetes.  Ice slows the blood flow in the region it is applied. This is beneficial when trying to stop inflamed tissues from spreading irritating chemicals to surrounding tissues. However, if you expose your skin to cold temperatures for too long or without the proper protection, you can damage your skin or nerves. Watch for signs of skin damage due to cold. HOME CARE INSTRUCTIONS Follow these tips to use ice and cold packs safely.  Place a dry or damp towel between the ice and skin. A damp towel will cool the  skin more quickly, so you may need to shorten the time that the ice is used.   For a more rapid response, add gentle compression to the ice.   Ice for no more than 10 to 20 minutes at a time. The bonier the area you are icing, the less time it will take to get the benefits of ice.   Check your skin after 5 minutes to make sure there are no signs of a poor response to cold or skin damage.   Rest 20 minutes or more in between uses.   Once your skin is numb, you can end your treatment. You can test numbness by very lightly touching your skin. The touch should be so light  that you do not see the skin dimple from the pressure of your fingertip. When using ice, most people will feel these normal sensations in this order: cold, burning, aching, and numbness.   Do not use ice on someone who cannot communicate their responses to pain, such as small children or people with dementia.  HOW TO MAKE AN ICE PACK Ice packs are the most common way to use ice therapy. Other methods include ice massage, ice baths, and cryo-sprays. Muscle creams that cause a cold, tingly feeling do not offer the same benefits that ice offers and should not be used as a substitute unless recommended by your caregiver. To make an ice pack, do one of the following:  Place crushed ice or a bag of frozen vegetables in a sealable plastic bag. Squeeze out the excess air. Place this bag inside another plastic bag. Slide the bag into a pillowcase or place a damp towel between your skin and the bag.   Mix 3 parts water with 1 part rubbing alcohol. Freeze the mixture in a sealable plastic bag. When you remove the mixture from the freezer, it will be slushy. Squeeze out the excess air. Place this bag inside another plastic bag. Slide the bag into a pillowcase or place a damp towel between your skin and the bag.  SEEK MEDICAL CARE IF:  You develop white spots on your skin. This may give the skin a blotchy (mottled) appearance.   Your skin turns blue or pale.   Your skin becomes waxy or hard.   Your swelling gets worse.  MAKE SURE YOU:   Understand these instructions.   Will watch your condition.   Will get help right away if you are not doing well or get worse.  Document Released: 09/21/2010 Document Revised: 01/14/2011 Document Reviewed: 09/21/2010 [facility] Patient Information 2012 [place], [place].Bone Bruise  A bone bruise is a small hidden fracture of the bone. It typically occurs with bones located close to the surface of the skin.  SYMPTOMS  The pain lasts longer than a normal bruise.   The  bruised area is difficult to use.   There may be discoloration or swelling of the bruised area.   When a bone bruise is found with injury to the anterior cruciate ligament (in the knee) there is often an increased:   Amount of fluid in the knee   Time the fluid in the knee lasts.   Number of days until you are walking normally and regaining the motion you had before the injury.   Number of days with pain from the injury.  DIAGNOSIS  It can only be seen on X-rays known as MRIs. This stands for magnetic resonance imaging. A regular X-ray taken of a bone bruise would appear to  be normal. A bone bruise is a common injury in the knee and the heel bone (calcaneus). The problems are similar to those produced by stress fractures, which are bone injuries caused by overuse. A bone bruise may also be a sign of other injuries. For example, bone bruises are commonly found where an anterior cruciate ligament (ACL) in the knee has been pulled away from the bone (ruptured). A ligament is a tough fibrous material that connects bones together to make our joints stable. Bruises of the bone last a lot longer than bruises of the muscle or tissues beneath the skin. Bone bruises can last from days to months and are often more severe and painful than other bruises. TREATMENT Because bone bruises are sudden injuries you cannot often prevent them, other than by being extremely careful. Some things you can do to improve the condition are:  Apply ice to the sore area for 15 to 20 minutes, 3 to 4 times per day while awake for the first 2 days. Put the ice in a plastic bag, and place a towel between the bag of ice and your skin.   Keep your bruised area raised (elevated) when possible to lessen swelling.   For activity:   Use crutches when necessary; do not put weight on the injured leg until you are no longer tender.   You may walk on your affected part as the pain allows, or as instructed.   Start weight bearing  gradually on the bruised part.   Continue to use crutches or a cane until you can stand without causing pain, or as instructed.   If a plaster splint was applied, wear the splint until you are seen for a follow-up examination. Rest it on nothing harder than a pillow the first 24 hours. Do not put weight on it. Do not get it wet. You may take it off to take a shower or bath.   If an air splint was applied, more air may be blown into or out of the splint as needed for comfort. You may take it off at night and to take a shower or bath.   Wiggle your toes in the splint several times per day if you are able.   You may have been given an elastic bandage to use with the plaster splint or alone. The splint is too tight if you have numbness, tingling or if your foot becomes cold and blue. Adjust the bandage to make it comfortable.   Only take over-the-counter or prescription medicines for pain, discomfort, or fever as directed by your caregiver.   Follow all instructions for follow up with your caregiver. This includes any orthopedic referrals, physical therapy, and rehabilitation. Any delay in obtaining necessary care could result in a delay or failure of the bones to heal.  SEEK MEDICAL CARE IF:   You have an increase in bruising, swelling, or pain.   You notice coldness of your toes.   You do not get pain relief with medications.  SEEK IMMEDIATE MEDICAL CARE IF:   Your toes are numb or blue.   You have severe pain not controlled with medications.   If any of the problems that caused you to seek care are becoming worse.  Document Released: 04/17/2003 Document Revised: 01/14/2011 Document Reviewed: 08/30/2007 [facility] Patient Information 2012 [place], [place].

## 2011-04-17 NOTE — Telephone Encounter (Signed)
Tell pt to call back after two weeks if she needs a refill  before her appt, cannot refill it now since she just got #30.

## 2011-04-18 NOTE — Telephone Encounter (Signed)
Spoke with pt advised msg from Sylvia Hall. Pt understood

## 2011-04-26 ENCOUNTER — Telehealth: Payer: Self-pay

## 2011-04-26 DIAGNOSIS — M549 Dorsalgia, unspecified: Secondary | ICD-10-CM

## 2011-04-26 NOTE — Telephone Encounter (Addendum)
Patient request refill on Norco- states she got 30 from message on 3/9 and will need refill before appt. Please call [phone] when ready. Patient states that we may only speak with her or [name].

## 2011-04-27 MED ORDER — HYDROCODONE-ACETAMINOPHEN 10-325 MG PO TABS: 1.0000 | ORAL_TABLET | Freq: Two times a day (BID) | ORAL | Status: DC

## 2011-04-27 NOTE — Telephone Encounter (Signed)
Rx ready for pick up.  This Rx should last 30 days.

## 2011-04-27 NOTE — Telephone Encounter (Signed)
Addended by: Fernande Bras on: 04/27/2011 06:17 PM   Modules accepted: Orders

## 2011-04-27 NOTE — Telephone Encounter (Signed)
Patient's representative [name]) picked up Rx

## 2011-05-12 ENCOUNTER — Ambulatory Visit: Payer: Self-pay | Admitting: Family Medicine

## 2011-05-12 ENCOUNTER — Ambulatory Visit (INDEPENDENT_AMBULATORY_CARE_PROVIDER_SITE_OTHER): Payer: Self-pay | Admitting: Family Medicine

## 2011-05-12 ENCOUNTER — Ambulatory Visit: Payer: Self-pay

## 2011-05-12 VITALS — BP 101/68 | HR 78 | Temp 98.0°F | Resp 16 | Ht 62.5 in | Wt 147.0 lb

## 2011-05-12 DIAGNOSIS — M542 Cervicalgia: Secondary | ICD-10-CM

## 2011-05-12 DIAGNOSIS — R000 Tachycardia, unspecified: Secondary | ICD-10-CM

## 2011-05-12 DIAGNOSIS — N61 Mastitis without abscess: Secondary | ICD-10-CM

## 2011-05-12 DIAGNOSIS — I341 Nonrheumatic mitral (valve) prolapse: Secondary | ICD-10-CM

## 2011-05-12 MED ORDER — ALPRAZOLAM 1 MG PO TABS: 1.0000 mg | ORAL_TABLET | Freq: Three times a day (TID) | ORAL | Status: DC

## 2011-05-12 MED ORDER — HYDROCODONE-ACETAMINOPHEN 5-325 MG PO TABS: 1.0000 | ORAL_TABLET | Freq: Four times a day (QID) | ORAL | Status: AC | PRN

## 2011-05-12 MED ORDER — CEPHALEXIN 500 MG PO CAPS: 1000.0000 mg | ORAL_CAPSULE | Freq: Two times a day (BID) | ORAL | Status: AC

## 2011-05-12 MED ORDER — PREDNISONE 20 MG PO TABS: ORAL_TABLET | ORAL | Status: AC

## 2011-05-12 MED ORDER — METOPROLOL SUCCINATE ER 50 MG PO TB24: 50.0000 mg | ORAL_TABLET | Freq: Every day | ORAL | Status: DC

## 2011-05-12 NOTE — Progress Notes (Signed)
Patient Name: Sylvia Hall Date of Birth: 1960-05-13 Medical Record Number: [id number] Gender: female Date of Encounter: 05/12/2011  History of Present Illness:  Sylvia Hall is a 51 y.o. very pleasant female patient who presents with the following:  About a month ago she had an infection in her breast- treated with antibiotics.  Keflex for 10 days.  No culture was taken due to lack of drainage at that time.  Her breast got better, but then symptoms returned a few days ago. "Drainage" is actually discharge from the nipple.  The breast feels sore.  Per her report her last mammogram was a couple of years ago.  She has had a couple of lumpectomies for the breast, but they were benign.   Also felt a "pop" in her left neck about 2 weeks ago.  This seemed to exacerbate her history of neck problems.  She then noted a "shock" down her left arm and feels a numbness in the 3rd and 4th fingers of her left hand.  This also has gone on for about 2 weeks.  She notes history of "problems with my back for a long time."  She has never had this numbness before.   She was advised to have surgery to remove cervical "bone spurs" in [place] but she was never able to do this.  She was seeing an orthopedist and PT while in [place].  She states she has had a "couple of epidural" injections but these did not help.    States it has been seveal years since her last neck xrays and MRI.  She takes hydrocodone regularly for her back and neck pain, and uses xanax TID for "rapid heart rate."   There is no problem list on file for this patient.  Past Medical History  Diagnosis Date  . Mild mitral valve prolapse   . Asthma   . Back pain, chronic   . Thrombophlebitis   . Mitral valve prolapse   . Epilepsy    Past Surgical History  Procedure Date  . Back surgery   . Nose surgery     s/p trauma  . Appendectomy   . Cesarean section   . Ovary surgery   . Uterine fibroid surgery   . Breast lumpectomy    History    Substance Use Topics  . Smoking status: Current Everyday Smoker -- 1.0 packs/day for 12 years    Types: Cigarettes  . Smokeless tobacco: Not on file  . Alcohol Use: No   No family history on file. Allergies  Allergen Reactions  . Dilantin Other (See Comments)    seizures  . Aspirin Hives  . Caffeine Other (See Comments)    Heart races  . Food     Sodium nitrates  . Ivp Dye (Iodinated Diagnostic Agents) Hives  . Levaquin Hives  . Sulfa Drugs Cross Reactors Hives   Medication list has been reviewed and updated.  Review of Systems: As per HPI- otherwise negative.  Physical Examination: Filed Vitals:   05/12/11 1024  BP: 101/68  Pulse: 78  Temp: 98 F (36.7 C)  TempSrc: Oral  Resp: 16  Height: 5' 2.5" (1.588 m)  Weight: 147 lb (66.679 kg)    Body mass index is 26.46 kg/(m^2).  GEN: WDWN, NAD, Non-toxic, A & O x 3 HEENT: Atraumatic, Normocephalic. Neck supple. No masses, No LAD.  Tm, oropharynx wnl Ears and Nose: No external deformity. CV: RRR, No M/G/R. No JVD. No thrill. No extra heart sounds. PULM: CTA  B, no wheezes, crackles, rhonchi. No retractions. No resp. distress. No accessory muscle use. Breast: right breast with evidence of clear discharge from the nipple- there is some crusting but no express-able fluid.  The breast is tender at 6:00 position, but no fluctuance or heat/ redness noted EXTR: No c/c/e NEURO Normal gait.  Normal UE and LE strength except slight weakness left hand grip.  Normal DTR PSYCH: Normally interactive. Conversant. Not depressed or anxious appearing.  Calm demeanor.  Cervical spine: good ROM except a little bit reduced to the right.    [facility] reading (PRIMARY) by  Dr. Patsy Lager.  C spine: moderate degenerative changes  CERVICAL SPINE - COMPLETE 4+ VIEW  Comparison: None.  Findings: Mild degenerative facet disease bilaterally. This causes mild right neural foraminal narrowing at C4-5. No significant left neural foraminal narrowing.  Normal alignment. Prevertebral soft tissues are normal. Disc spaces are maintained.  IMPRESSION: Mild spondylosis as above. No acute findings.  and Plan: 1. Breast infection  cephALEXin (KEFLEX) 500 MG capsule  2. Neck pain  DG Cervical Spine Complete, predniSONE (DELTASONE) 20 MG tablet, HYDROcodone-acetaminophen (NORCO) 5-325 MG per tablet  treat possible mastitis with keflex, but also encouraged her to schedule a mammogram asap.  She plans to do this.  Suspect nerve impingement causing pain/ numbness down arm.  Treat with a short course of prednisone as above.,  Refilled hydrocondone- changed to 5mg  from 10mg  as this is cheaper.   See patient instruction     Although Epic raises concerns for prednisone, keflex and vicodin due to her dilantin allergy, artice states she has taken all of these medications in the past without any problems

## 2011-05-12 NOTE — Patient Instructions (Signed)
Please schedule a mammogram Please schedule a mammogram.  Some local resources are 1. [facility] [address] [phone] 2. [facility]  [address] [phone] 3. [facility] [place]  [phone] [address]   We will contact you with an orthopedist appointment.

## 2011-05-14 ENCOUNTER — Ambulatory Visit: Payer: Self-pay | Admitting: Family Medicine

## 2011-05-26 ENCOUNTER — Ambulatory Visit (INDEPENDENT_AMBULATORY_CARE_PROVIDER_SITE_OTHER): Payer: Self-pay | Admitting: Family Medicine

## 2011-05-26 VITALS — BP 104/58 | HR 71 | Temp 98.0°F | Resp 16 | Ht 62.5 in | Wt 148.2 lb

## 2011-05-26 DIAGNOSIS — G8929 Other chronic pain: Secondary | ICD-10-CM

## 2011-05-26 DIAGNOSIS — M549 Dorsalgia, unspecified: Secondary | ICD-10-CM

## 2011-05-26 DIAGNOSIS — N6019 Diffuse cystic mastopathy of unspecified breast: Secondary | ICD-10-CM

## 2011-05-26 MED ORDER — HYDROCODONE-ACETAMINOPHEN 5-325 MG PO TABS: 1.0000 | ORAL_TABLET | Freq: Three times a day (TID) | ORAL | Status: AC | PRN

## 2011-05-26 NOTE — Patient Instructions (Addendum)
Chronic Pain Chronic pain can be defined as pain that is lasting, off and on, and lasts for 3 to 6 months or longer. Many things cause chronic pain, which can make it difficult to make a discrete diagnosis. There are many treatment options available for chronic pain. However, finding a treatment that works well for you may require trying various approaches until a suitable one is found. CAUSES  In some types of chronic medical conditions, the pain is caused by a normal pain response within the body. A normal pain response helps the body identify illness or injury and prevent further damage from being done. In these cases, the cause of the pain may be identified and treated, even if it may not be cured completely. Examples of chronic conditions which can cause chronic pain include:  Inflammation of the joints (arthritis).   Back pain or neck pain (including bulging or herniated disks).   Migraine headaches.   Cancer.  In some other types of chronic pain syndromes, the pain is caused by an abnormal pain response within the body. An abnormal pain response is present when there is no ongoing cause (or stimulus) for the pain, or when the cause of the pain is arising from the nerves or nervous system itself. Examples of conditions which can cause chronic pain due to an abnormal pain response include:  Fibromyalgia.   Reflex sympathetic dystrophy (RSD).   Neuropathy (when the nerves themselves are damaged, and may cause pain).  DIAGNOSIS  Your caregiver will help diagnose your condition over time. In many cases, the initial focus will be on excluding conditions that could be causing the pain. Depending on your symptoms, your caregiver may order some tests to diagnose your condition. Some of these tests include:  Blood tests.   Computerized X-ray scans (CT scan).   Computerized magnetic scans (MRI).   X-rays.   Ultrasounds.   Nerve conduction studies.   Consultation with other  physicians or specialists.  TREATMENT  There are many treatment options for people suffering from chronic pain. Finding a treatment that works well may take time.   You may be referred to a pain management specialist.   You may be put on medication to help with the pain. Unfortunately, some medications (such as opiate medications) may not be very effective in cases where chronic pain is due to abnormal pain responses. Finding the right medications can take some time.   Adjunctive therapies may be used to provide additional relief and improve a patient's quality of life. These therapies include:   Mindfulness meditation.   Acupuncture.   Biofeedback.   Cognitive-behavioral therapy.   In certain cases, surgical interventions may be attempted.  HOME CARE INSTRUCTIONS   Make sure you understand these instructions prior to discharge.   Ask any questions and share any further concerns you have with your caregiver prior to discharge.   Take all medications as directed by your caregiver.   Keep all follow-up appointments.  SEEK MEDICAL CARE IF:   Your pain gets worse.   You develop a new pain that was not present before.   You cannot tolerate any medications prescribed by your caregiver.   You develop new symptoms since your last visit with your caregiver.  SEEK IMMEDIATE MEDICAL CARE IF:   You develop muscular weakness.   You have decreased sensation or numbness.   You lose control of bowel or bladder function.   Your pain suddenly gets much worse.   You have  an oral temperature above 102 F (38.9 C), not controlled by medication.   You develop shaking chills, confusion, chest pain, or shortness of breath.  Document Released: 10/17/2001 Document Revised: 01/14/2011 Document Reviewed: 01/24/2008 [facility] Patient Information 2012 [place], [place].   Get OTC FLAX SEED OIL CAPSULES 1000-12000 mg  And take 1 daily to help reduce inflammation and pain.   Get OTC ALLEGRA or  CETIRIZINE for allergies and take it daily.

## 2011-05-29 ENCOUNTER — Encounter: Payer: Self-pay | Admitting: Family Medicine

## 2011-05-29 DIAGNOSIS — N6019 Diffuse cystic mastopathy of unspecified breast: Secondary | ICD-10-CM | POA: Insufficient documentation

## 2011-05-29 DIAGNOSIS — Z720 Tobacco use: Secondary | ICD-10-CM | POA: Insufficient documentation

## 2011-05-29 DIAGNOSIS — I341 Nonrheumatic mitral (valve) prolapse: Secondary | ICD-10-CM | POA: Insufficient documentation

## 2011-05-29 DIAGNOSIS — J309 Allergic rhinitis, unspecified: Secondary | ICD-10-CM | POA: Insufficient documentation

## 2011-05-29 DIAGNOSIS — M549 Dorsalgia, unspecified: Secondary | ICD-10-CM | POA: Insufficient documentation

## 2011-05-29 DIAGNOSIS — G8929 Other chronic pain: Secondary | ICD-10-CM | POA: Insufficient documentation

## 2011-05-29 DIAGNOSIS — J45909 Unspecified asthma, uncomplicated: Secondary | ICD-10-CM | POA: Insufficient documentation
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
## 2011-06-12 ENCOUNTER — Encounter (HOSPITAL_COMMUNITY): Payer: Self-pay | Admitting: Family Medicine

## 2011-06-12 ENCOUNTER — Emergency Department (HOSPITAL_COMMUNITY)
Admission: EM | Admit: 2011-06-12 | Discharge: 2011-06-13 | Disposition: A | Discharge status: Home / Self Care | Payer: Self-pay | Attending: Emergency Medicine | Admitting: Emergency Medicine

## 2011-06-12 DIAGNOSIS — R209 Unspecified disturbances of skin sensation: Secondary | ICD-10-CM | POA: Insufficient documentation

## 2011-06-12 DIAGNOSIS — F17200 Nicotine dependence, unspecified, uncomplicated: Secondary | ICD-10-CM | POA: Insufficient documentation

## 2011-06-12 DIAGNOSIS — I059 Rheumatic mitral valve disease, unspecified: Secondary | ICD-10-CM | POA: Insufficient documentation

## 2011-06-12 DIAGNOSIS — M549 Dorsalgia, unspecified: Secondary | ICD-10-CM | POA: Insufficient documentation

## 2011-06-12 DIAGNOSIS — M542 Cervicalgia: Secondary | ICD-10-CM

## 2011-06-12 DIAGNOSIS — G8929 Other chronic pain: Secondary | ICD-10-CM

## 2011-06-12 DIAGNOSIS — F1120 Opioid dependence, uncomplicated: Secondary | ICD-10-CM

## 2011-06-12 NOTE — ED Notes (Signed)
Pt presented to the ER with multiple;e complaints, states that pain is chronic, however, not able to control at this time, pt reports pain to neck and back and numbness to leg and arm, left side.

## 2011-06-12 NOTE — ED Notes (Signed)
Patient states she has had neck and back pain with numbness to left hand, left leg and left cheek. States numbness started weeks ago but it is spreading. Was seen at [place] 2 weeks ago, was referred to a sports doctor but will not see patient because she is uninsured.

## 2011-06-13 MED ORDER — HYDROCODONE-ACETAMINOPHEN 5-325 MG PO TABS: 1.0000 | ORAL_TABLET | Freq: Three times a day (TID) | ORAL | Status: AC | PRN

## 2011-06-13 MED ORDER — PREDNISONE (PAK) 10 MG PO TABS: ORAL_TABLET | ORAL | Status: AC

## 2011-06-13 MED ORDER — METHOCARBAMOL 500 MG PO TABS: 500.0000 mg | ORAL_TABLET | Freq: Two times a day (BID) | ORAL | Status: AC

## 2011-06-13 NOTE — ED Notes (Signed)
Ambulates without distress.  

## 2011-06-13 NOTE — ED Provider Notes (Signed)
History     CSN: [id number]  Arrival date & time 06/12/11  2146   First MD Initiated Contact with Patient 06/13/11 0038      1:29 AM HPI Pt reports she has chronic neck and back pain. Reports pain has been worsening. States that she has been out of norco. Reports now has a numbness sensation on left hand and left foot. Denies saddle anesthesias, perineal numbness, difficulty with ambulation, abdominal pain, or urinary symptoms.   Patient is a 51 y.o. female presenting with neck pain and back pain. The history is provided by the patient.  Neck Pain  This is a chronic problem. The problem occurs constantly. The problem has been gradually worsening. There has been no fever. The quality of the pain is described as shooting. The pain radiates to the left arm. The pain is severe. The symptoms are aggravated by bending and twisting. Associated symptoms include numbness and tingling. Pertinent negatives include no photophobia, no visual change, no chest pain, no headaches, no bowel incontinence, no bladder incontinence, no leg pain, no paresis and no weakness. She has tried analgesics for the symptoms. The treatment provided no relief.  Back Pain  This is a chronic problem. The problem occurs constantly. The problem has been gradually worsening. The pain is present in the lumbar spine. The quality of the pain is described as shooting. The pain radiates to the left thigh. The pain is severe. The symptoms are aggravated by bending, twisting and certain positions. Associated symptoms include numbness and tingling. Pertinent negatives include no chest pain, no fever, no headaches, no abdominal pain, no bowel incontinence, no perianal numbness, no bladder incontinence, no dysuria, no pelvic pain, no leg pain, no paresthesias, no paresis and no weakness. She has tried analgesics for the symptoms. The treatment provided moderate relief.    Past Medical History  Diagnosis Date  . Mild mitral valve prolapse   .  Asthma   . Back pain, chronic   . Thrombophlebitis   . Mitral valve prolapse   . Epilepsy     Past Surgical History  Procedure Date  . Back surgery   . Nose surgery     s/p trauma  . Appendectomy   . Cesarean section   . Ovary surgery   . Uterine fibroid surgery   . Breast lumpectomy   . Abdominal hysterectomy   . Breast lumpectomy     Right breast  . Biopsy breast     Right breast x 10  . Rhinoplasty     No family history on file.  History  Substance Use Topics  . Smoking status: Current Everyday Smoker -- 1.0 packs/day for 12 years    Types: Cigarettes  . Smokeless tobacco: Not on file  . Alcohol Use: No    OB History    Grav Para Term Preterm Abortions TAB SAB Ect Mult Living                  Review of Systems  Constitutional: Negative for fever and chills.  HENT: Positive for neck pain. Negative for congestion, sore throat, rhinorrhea and neck stiffness.   Eyes: Negative for photophobia.  Respiratory: Negative for cough and stridor.   Cardiovascular: Negative for chest pain.  Gastrointestinal: Negative for abdominal pain and bowel incontinence.  Genitourinary: Negative for bladder incontinence, dysuria, urgency, frequency, hematuria, flank pain and pelvic pain.  Musculoskeletal: Positive for back pain. Negative for myalgias.       Denies saddle anesthesias, or perineal  numbness, urinary or bowel incontinence  Skin: Negative for rash.  Neurological: Positive for tingling and numbness. Negative for weakness, headaches and paresthesias.  All other systems reviewed and are negative.    Allergies  Phenytoin sodium extended; Aspirin; Caffeine; Food; Ivp dye; Levofloxacin; and Sulfa drugs cross reactors  Home Medications   Current Outpatient Rx  Name Route Sig Dispense Refill  . ALBUTEROL SULFATE HFA 108 (90 BASE) MCG/ACT IN AERS Inhalation Inhale 2 puffs into the lungs every 6 (six) hours as needed. For shortness of breath    . ALPRAZOLAM 1 MG PO TABS  Oral Take 1 mg by mouth 3 (three) times daily as needed.    Marland Kitchen DIPHENHYDRAMINE HCL (SLEEP) 25 MG PO TABS Oral Take 25 mg by mouth at bedtime as needed.    Marland Kitchen DOCUSATE SODIUM 100 MG PO CAPS Oral Take 100 mg by mouth 2 (two) times daily.    Marland Kitchen HYDROCODONE-ACETAMINOPHEN 5-325 MG PO TABS Oral Take 1 tablet by mouth every 6 (six) hours as needed.    . IBUPROFEN 200 MG PO TABS Oral Take 800 mg by mouth every 6 (six) hours as needed. For pain    . METOPROLOL SUCCINATE ER 50 MG PO TB24 Oral Take 1 tablet (50 mg total) by mouth daily. 90 tablet 3  . PSEUDOEPHEDRINE-APAP-DM 30-325-15 MG PO TABS Oral Take 2 tablets by mouth every 6 (six) hours as needed. For cold    . RANITIDINE HCL 75 MG PO TABS Oral Take 75 mg by mouth daily as needed. ACID REFLUX      BP 96/57  Pulse 68  Temp(Src) 98 F (36.7 C) (Oral)  Resp 16  SpO2 99%  Physical Exam  Vitals reviewed. Constitutional: She is oriented to person, place, and time. Vital signs are normal. She appears well-developed and well-nourished.  HENT:  Head: Normocephalic and atraumatic.  Eyes: Conjunctivae are normal. Pupils are equal, round, and reactive to light.  Neck: Normal range of motion. Neck supple.  Cardiovascular: Normal rate, regular rhythm and normal heart sounds.  Exam reveals no friction rub.   No murmur heard. Pulmonary/Chest: Effort normal and breath sounds normal. She has no wheezes. She has no rhonchi. She has no rales. She exhibits no tenderness.  Musculoskeletal: Normal range of motion.       Cervical back: She exhibits tenderness, pain and spasm. She exhibits normal range of motion and normal pulse.       Lumbar back: She exhibits tenderness.       Back:       Patient has full range of motion of neck. Tender to palpation at approximately C7. Normal distal pulses, strength, capillary refill. Patient reports decreased sensation in left hand compared to right. Patient has a negative straight leg raise. Normal distal lower extremity  pulses.  Neurological: She is alert and oriented to person, place, and time. Coordination normal.  Skin: Skin is warm and dry. No rash noted. No erythema. No pallor.    ED Course  Procedures   MDM  Pt has chronic pain. Looked up on drug database. And should still have norco at home. Will Rx 10 norco but advised that it is inappropriate to come to ED for narcotic refills and I have therefore provided her with our resource guide. No new injury or signs of spinal compression. Pt has had cspine xrays in April.        Thomasene Lot, PA-C 06/13/11 0147

## 2011-06-13 NOTE — ED Notes (Signed)
Pt states increase in numbness to left side related to "pinched nerve" per pt. Pt states already seen by MD for same. Today increase in symptoms. Pt MAE randomly.

## 2011-06-14 NOTE — ED Provider Notes (Signed)
Medical screening examination/treatment/procedure(s) were performed by non-physician practitioner and as supervising physician I was immediately available for consultation/collaboration.  Sunnie Nielsen, MD 06/14/11 [phone]

## 2011-06-15 ENCOUNTER — Ambulatory Visit (INDEPENDENT_AMBULATORY_CARE_PROVIDER_SITE_OTHER): Payer: Self-pay | Admitting: Family Medicine

## 2011-06-15 VITALS — BP 98/65 | HR 71 | Temp 97.8°F | Resp 16 | Ht 63.0 in | Wt 147.0 lb

## 2011-06-15 DIAGNOSIS — G8929 Other chronic pain: Secondary | ICD-10-CM

## 2011-06-15 DIAGNOSIS — M542 Cervicalgia: Secondary | ICD-10-CM

## 2011-06-15 MED ORDER — HYDROCODONE-ACETAMINOPHEN 5-325 MG PO TABS: 1.0000 | ORAL_TABLET | Freq: Three times a day (TID) | ORAL | Status: DC | PRN

## 2011-06-15 NOTE — Patient Instructions (Signed)
CERVICAL SPINE - COMPLETE 4+ VIEW  Comparison: None.  Findings: Mild degenerative facet disease bilaterally. This causes  mild right neural foraminal narrowing at C4-5. No significant left  neural foraminal narrowing. Normal alignment. Prevertebral soft  tissues are normal. Disc spaces are maintained.  IMPRESSION:  Mild spondylosis as above. No acute findings.  Original Report Authenticated By: Cyndie Chime, M.D.   History   CSN: [id number]  Arrival date & time 06/12/11 2146  First MD Initiated Contact with Patient 06/13/11 0038  1:29 AM  HPI  Pt reports she has chronic neck and back pain. Reports pain has been worsening. States that she has been out of norco. Reports now has a numbness sensation on left hand and left foot. Denies saddle anesthesias, perineal numbness, difficulty with ambulation, abdominal pain, or urinary symptoms.  Patient is a 51 y.o. female presenting with neck pain and back pain. The history is provided by the patient.  Neck Pain  This is a chronic problem. The problem occurs constantly. The problem has been gradually worsening. There has been no fever. The quality of the pain is described as shooting. The pain radiates to the left arm. The pain is severe. The symptoms are aggravated by bending and twisting. Associated symptoms include numbness and tingling. Pertinent negatives include no photophobia, no visual change, no chest pain, no headaches, no bowel incontinence, no bladder incontinence, no leg pain, no paresis and no weakness. She has tried analgesics for the symptoms. The treatment provided no relief.  Back Pain  This is a chronic problem. The problem occurs constantly. The problem has been gradually worsening. The pain is present in the lumbar spine. The quality of the pain is described as shooting. The pain radiates to the left thigh. The pain is severe. The symptoms are aggravated by bending, twisting and certain positions. Associated symptoms include numbness  and tingling. Pertinent negatives include no chest pain, no fever, no headaches, no abdominal pain, no bowel incontinence, no perianal numbness, no bladder incontinence, no dysuria, no pelvic pain, no leg pain, no paresthesias, no paresis and no weakness. She has tried analgesics for the symptoms. The treatment provided moderate relief.  Past Medical History   Diagnosis  Date   .  Mild mitral valve prolapse    .  Asthma    .  Back pain, chronic    .  Thrombophlebitis    .  Mitral valve prolapse    .  Epilepsy     Past Surgical History   Procedure  Date   .  Back surgery    .  Nose surgery      s/p trauma   .  Appendectomy    .  Cesarean section    .  Ovary surgery    .  Uterine fibroid surgery    .  Breast lumpectomy    .  Abdominal hysterectomy    .  Breast lumpectomy      Right breast   .  Biopsy breast      Right breast x 10   .  Rhinoplasty    No family history on file.  History   Substance Use Topics   .  Smoking status:  Current Everyday Smoker -- 1.0 packs/day for 12 years     Types:  Cigarettes   .  Smokeless tobacco:  Not on file   .  Alcohol Use:  No    OB History    Grav  Para  Term  Preterm  Abortions  TAB  SAB  Ect  Mult  Living                 Review of Systems  Constitutional: Negative for fever and chills.  HENT: Positive for neck pain. Negative for congestion, sore throat, rhinorrhea and neck stiffness.  Eyes: Negative for photophobia.  Respiratory: Negative for cough and stridor.  Cardiovascular: Negative for chest pain.  Gastrointestinal: Negative for abdominal pain and bowel incontinence.  Genitourinary: Negative for bladder incontinence, dysuria, urgency, frequency, hematuria, flank pain and pelvic pain.  Musculoskeletal: Positive for back pain. Negative for myalgias.  Denies saddle anesthesias, or perineal numbness, urinary or bowel incontinence  Skin: Negative for rash.  Neurological: Positive for tingling and numbness. Negative for weakness,  headaches and paresthesias.  All other systems reviewed and are negative.  Allergies   Phenytoin sodium extended; Aspirin; Caffeine; Food; Ivp dye; Levofloxacin; and Sulfa drugs cross reactors  Home Medications    Current Outpatient Rx   Name  Route  Sig  Dispense  Refill   .  ALBUTEROL SULFATE HFA 108 (90 BASE) MCG/ACT IN AERS  Inhalation  Inhale 2 puffs into the lungs every 6 (six) hours as needed. For shortness of breath     .  ALPRAZOLAM 1 MG PO TABS  Oral  Take 1 mg by mouth 3 (three) times daily as needed.     Marland Kitchen  DIPHENHYDRAMINE HCL (SLEEP) 25 MG PO TABS  Oral  Take 25 mg by mouth at bedtime as needed.     Marland Kitchen  DOCUSATE SODIUM 100 MG PO CAPS  Oral  Take 100 mg by mouth 2 (two) times daily.     Marland Kitchen  HYDROCODONE-ACETAMINOPHEN 5-325 MG PO TABS  Oral  Take 1 tablet by mouth every 6 (six) hours as needed.     .  IBUPROFEN 200 MG PO TABS  Oral  Take 800 mg by mouth every 6 (six) hours as needed. For pain     .  METOPROLOL SUCCINATE ER 50 MG PO TB24  Oral  Take 1 tablet (50 mg total) by mouth daily.  90 tablet  3   .  PSEUDOEPHEDRINE-APAP-DM 30-325-15 MG PO TABS  Oral  Take 2 tablets by mouth every 6 (six) hours as needed. For cold     .  RANITIDINE HCL 75 MG PO TABS  Oral  Take 75 mg by mouth daily as needed. ACID REFLUX     BP 96/57  Pulse 68  Temp(Src) 98 F (36.7 C) (Oral)  Resp 16  SpO2 99%  Physical Exam  Vitals reviewed.  Constitutional: She is oriented to person, place, and time. Vital signs are normal. She appears well-developed and well-nourished.  HENT:  Head: Normocephalic and atraumatic.  Eyes: Conjunctivae are normal. Pupils are equal, round, and reactive to light.  Neck: Normal range of motion. Neck supple.  Cardiovascular: Normal rate, regular rhythm and normal heart sounds. Exam reveals no friction rub.  No murmur heard.  Pulmonary/Chest: Effort normal and breath sounds normal. She has no wheezes. She has no rhonchi. She has no rales. She exhibits no tenderness.    Musculoskeletal: Normal range of motion.  Cervical back: She exhibits tenderness, pain and spasm. She exhibits normal range of motion and normal pulse.  Lumbar back: She exhibits tenderness.  Back:  Patient has full range of motion of neck. Tender to palpation at approximately C7. Normal distal pulses, strength, capillary refill. Patient reports decreased sensation in left hand compared to right.  Patient has a negative straight leg raise. Normal distal lower extremity pulses.  Neurological: She is alert and oriented to person, place, and time. Coordination normal.  Skin: Skin is warm and dry. No rash noted. No erythema. No pallor.  ED Course   Procedures  MDM   Pt has chronic pain. Looked up on drug database. And should still have norco at home. Will Rx 10 norco but advised that it is inappropriate to come to ED for narcotic refills and I have therefore provided her with our resource guide. No new injury or signs of spinal compression. Pt has had cspine xrays in April.  Thomasene Lot, PA-C  06/13/11 0147

## 2011-06-15 NOTE — Progress Notes (Signed)
51 yo woman with recent exacerbation of neck pain, numbness on left face, left finger tips, and left leg.  She was seen Saturday in ED and treated with Norco, prednisone and muscle relaxer.  She was referred here from the ED.  Feeling has returned to leg, toes and fingers.  She still has some parethesias on face or left shoulder.  Has seen Benny Lennert, and two other women doctors here.  They have been refilling meds.  Patient seen with husband.  They are now moved from [place].    Patient has been on alprazolam and metoprolol for 25 years.  Patient has had cortiosone injections to neck bilaterally, spinal block epidural, TENS units, physical therapy, whirlpool  O:  NAD.  Patient is here with female friend  CERVICAL SPINE - COMPLETE 4+ VIEW  Comparison: None.  Findings: Mild degenerative facet disease bilaterally. This causes  mild right neural foraminal narrowing at C4-5. No significant left  neural foraminal narrowing. Normal alignment. Prevertebral soft  tissues are normal. Disc spaces are maintained.  IMPRESSION:  Mild spondylosis as above. No acute findings.  Original Report Authenticated By: Cyndie Chime, M.D.   History   CSN: [id number]  Arrival date & time 06/12/11 2146  First MD Initiated Contact with Patient 06/13/11 0038  1:29 AM  HPI  Pt reports she has chronic neck and back pain. Reports pain has been worsening. States that she has been out of norco. Reports now has a numbness sensation on left hand and left foot. Denies saddle anesthesias, perineal numbness, difficulty with ambulation, abdominal pain, or urinary symptoms.  Patient is a 51 y.o. female presenting with neck pain and back pain. The history is provided by the patient.  Neck Pain  This is a chronic problem. The problem occurs constantly. The problem has been gradually worsening. There has been no fever. The quality of the pain is described as shooting. The pain radiates to the left arm. The pain is severe. The  symptoms are aggravated by bending and twisting. Associated symptoms include numbness and tingling. Pertinent negatives include no photophobia, no visual change, no chest pain, no headaches, no bowel incontinence, no bladder incontinence, no leg pain, no paresis and no weakness. She has tried analgesics for the symptoms. The treatment provided no relief.  Back Pain  This is a chronic problem. The problem occurs constantly. The problem has been gradually worsening. The pain is present in the lumbar spine. The quality of the pain is described as shooting. The pain radiates to the left thigh. The pain is severe. The symptoms are aggravated by bending, twisting and certain positions. Associated symptoms include numbness and tingling. Pertinent negatives include no chest pain, no fever, no headaches, no abdominal pain, no bowel incontinence, no perianal numbness, no bladder incontinence, no dysuria, no pelvic pain, no leg pain, no paresthesias, no paresis and no weakness. She has tried analgesics for the symptoms. The treatment provided moderate relief.  Past Medical History   Diagnosis  Date   .  Mild mitral valve prolapse    .  Asthma    .  Back pain, chronic    .  Thrombophlebitis    .  Mitral valve prolapse    .  Epilepsy     Past Surgical History   Procedure  Date   .  Back surgery    .  Nose surgery      s/p trauma   .  Appendectomy    .  Cesarean section    .  Ovary surgery    .  Uterine fibroid surgery    .  Breast lumpectomy    .  Abdominal hysterectomy    .  Breast lumpectomy      Right breast   .  Biopsy breast      Right breast x 10   .  Rhinoplasty    No family history on file.  History   Substance Use Topics   .  Smoking status:  Current Everyday Smoker -- 1.0 packs/day for 12 years     Types:  Cigarettes   .  Smokeless tobacco:  Not on file   .  Alcohol Use:  No    OB History    Grav  Para  Term  Preterm  Abortions  TAB  SAB  Ect  Mult  Living                 Review  of Systems  Constitutional: Negative for fever and chills.  HENT: Positive for neck pain. Negative for congestion, sore throat, rhinorrhea and neck stiffness.  Eyes: Negative for photophobia.  Respiratory: Negative for cough and stridor.  Cardiovascular: Negative for chest pain.  Gastrointestinal: Negative for abdominal pain and bowel incontinence.  Genitourinary: Negative for bladder incontinence, dysuria, urgency, frequency, hematuria, flank pain and pelvic pain.  Musculoskeletal: Positive for back pain. Negative for myalgias.  Denies saddle anesthesias, or perineal numbness, urinary or bowel incontinence  Skin: Negative for rash.  Neurological: Positive for tingling and numbness. Negative for weakness, headaches and paresthesias.  All other systems reviewed and are negative.  Allergies   Phenytoin sodium extended; Aspirin; Caffeine; Food; Ivp dye; Levofloxacin; and Sulfa drugs cross reactors  Home Medications    Current Outpatient Rx   Name  Route  Sig  Dispense  Refill   .  ALBUTEROL SULFATE HFA 108 (90 BASE) MCG/ACT IN AERS  Inhalation  Inhale 2 puffs into the lungs every 6 (six) hours as needed. For shortness of breath     .  ALPRAZOLAM 1 MG PO TABS  Oral  Take 1 mg by mouth 3 (three) times daily as needed.     Marland Kitchen  DIPHENHYDRAMINE HCL (SLEEP) 25 MG PO TABS  Oral  Take 25 mg by mouth at bedtime as needed.     Marland Kitchen  DOCUSATE SODIUM 100 MG PO CAPS  Oral  Take 100 mg by mouth 2 (two) times daily.     Marland Kitchen  HYDROCODONE-ACETAMINOPHEN 5-325 MG PO TABS  Oral  Take 1 tablet by mouth every 6 (six) hours as needed.     .  IBUPROFEN 200 MG PO TABS  Oral  Take 800 mg by mouth every 6 (six) hours as needed. For pain     .  METOPROLOL SUCCINATE ER 50 MG PO TB24  Oral  Take 1 tablet (50 mg total) by mouth daily.  90 tablet  3   .  PSEUDOEPHEDRINE-APAP-DM 30-325-15 MG PO TABS  Oral  Take 2 tablets by mouth every 6 (six) hours as needed. For cold     .  RANITIDINE HCL 75 MG PO TABS  Oral  Take 75 mg by mouth  daily as needed. ACID REFLUX     BP 96/57  Pulse 68  Temp(Src) 98 F (36.7 C) (Oral)  Resp 16  SpO2 99%  Physical Exam  Vitals reviewed.  Constitutional: She is oriented to person, place, and time. Vital signs are normal. She appears well-developed and well-nourished.  HENT:  Head: Normocephalic  and atraumatic.  Eyes: Conjunctivae are normal. Pupils are equal, round, and reactive to light.  Neck: Normal range of motion. Neck supple.  Cardiovascular: Normal rate, regular rhythm and normal heart sounds. Exam reveals no friction rub.  No murmur heard.  Pulmonary/Chest: Effort normal and breath sounds normal. She has no wheezes. She has no rhonchi. She has no rales. She exhibits no tenderness.  Musculoskeletal: Normal range of motion.  Cervical back: She exhibits tenderness, pain and spasm. She exhibits normal range of motion and normal pulse.  Lumbar back: She exhibits tenderness.  Back:  Patient has full range of motion of neck. Tender to palpation at approximately C7. Normal distal pulses, strength, capillary refill. Patient reports decreased sensation in left hand compared to right. Patient has a negative straight leg raise. Normal distal lower extremity pulses.  Neurological: She is alert and oriented to person, place, and time. Coordination normal.  Skin: Skin is warm and dry. No rash noted. No erythema. No pallor.  ED Course   Procedures  MDM   Pt has chronic pain. Looked up on drug database. And should still have norco at home. Will Rx 10 norco but advised that it is inappropriate to come to ED for narcotic refills and I have therefore provided her with our resource guide. No new injury or signs of spinal compression. Pt has had cspine xrays in April.  Thomasene Lot, PA-C  06/13/11 0147  NAD Chest clear Heart reg, no murmur or gallop Skin: recent tattoo left leg Ext:  Full ROM 4 extrem; no edema Reflexes:  Normal AJ, BJ, KJ, TJ Neck ROM almost complete (lacks 20 degrees on  left rotation)  A:  Chronic narcotics, recent paresthesias.  P:  Recheck 1 mth. Norco refill. Neurology referral Mammogram

## 2011-06-24 ENCOUNTER — Encounter: Payer: Self-pay | Admitting: Family Medicine

## 2011-06-24 ENCOUNTER — Ambulatory Visit (INDEPENDENT_AMBULATORY_CARE_PROVIDER_SITE_OTHER): Payer: Self-pay | Admitting: Family Medicine

## 2011-06-24 VITALS — BP 109/72 | HR 88 | Temp 98.3°F | Resp 16 | Ht 62.5 in | Wt 147.0 lb

## 2011-06-24 DIAGNOSIS — J019 Acute sinusitis, unspecified: Secondary | ICD-10-CM

## 2011-06-24 DIAGNOSIS — R51 Headache: Secondary | ICD-10-CM

## 2011-06-24 MED ORDER — KETOROLAC TROMETHAMINE 60 MG/2ML IM SOLN
60.0000 mg | Freq: Once | INTRAMUSCULAR | Status: AC
  Administered 2011-06-24: 60 mg via INTRAMUSCULAR

## 2011-06-24 MED ORDER — AMOXICILLIN 875 MG PO TABS: 875.0000 mg | ORAL_TABLET | Freq: Two times a day (BID) | ORAL | Status: AC

## 2011-06-24 MED ORDER — PREDNISONE 20 MG PO TABS: ORAL_TABLET | ORAL | Status: AC

## 2011-06-24 NOTE — Progress Notes (Signed)
  Subjective:    Patient ID: Sylvia Hall, female    DOB: 21-Oct-1960, 51 y.o.   MRN: [id number]  HPI 51 yo female with sore throat and ear pain. Right ear and throat pain with right sided sinus pain.  Going on for 2 days.  Tried OTC ear drops.  Slight chest congestion.  No fever.    Also, bug bite on left elbow 4 days ago.  Still stings.  Seems a little bigger.  Didn't see what bit her.  It was while she was mowing.     Review of Systems Negative except as per HPI     Objective:   Physical Exam  Constitutional: She appears well-developed. No distress.  HENT:  Right Ear: Tympanic membrane, external ear and ear canal normal. Tympanic membrane is not injected, not scarred, not perforated, not erythematous, not retracted and not bulging.  Left Ear: Tympanic membrane, external ear and ear canal normal. Tympanic membrane is not injected, not scarred, not perforated, not erythematous, not retracted and not bulging.  Nose: Mucosal edema present. No rhinorrhea. Right sinus exhibits maxillary sinus tenderness and frontal sinus tenderness. Left sinus exhibits no maxillary sinus tenderness and no frontal sinus tenderness.  Mouth/Throat: Uvula is midline, oropharynx is clear and moist and mucous membranes are normal. No oropharyngeal exudate or tonsillar abscesses.  Cardiovascular: Normal rate, regular rhythm, normal heart sounds and intact distal pulses.   No murmur heard. Pulmonary/Chest: Effort normal and breath sounds normal. No respiratory distress. She has no wheezes. She has no rales.  Lymphadenopathy:       Head (right side): Posterior auricular adenopathy present. No submandibular and no preauricular adenopathy present.       Head (left side): No submandibular and no preauricular adenopathy present.    She has cervical adenopathy.       Right cervical: Superficial cervical adenopathy present. No posterior cervical adenopathy present.      Left cervical: No superficial cervical and no  posterior cervical adenopathy present.       Right: No supraclavicular adenopathy present.       Left: No supraclavicular adenopathy present.  Skin: Skin is warm and dry.          Assessment & Plan:  Sinusitis, headache - amoxicillin, prednisone toradol here.

## 2011-07-07 ENCOUNTER — Ambulatory Visit (INDEPENDENT_AMBULATORY_CARE_PROVIDER_SITE_OTHER): Payer: Self-pay | Admitting: Family Medicine

## 2011-07-07 VITALS — BP 93/64 | HR 82 | Temp 98.1°F | Resp 16 | Ht 62.75 in | Wt 150.6 lb

## 2011-07-07 DIAGNOSIS — G8929 Other chronic pain: Secondary | ICD-10-CM

## 2011-07-07 DIAGNOSIS — G894 Chronic pain syndrome: Secondary | ICD-10-CM

## 2011-07-07 DIAGNOSIS — I959 Hypotension, unspecified: Secondary | ICD-10-CM

## 2011-07-07 DIAGNOSIS — M79606 Pain in leg, unspecified: Secondary | ICD-10-CM

## 2011-07-07 DIAGNOSIS — M542 Cervicalgia: Secondary | ICD-10-CM

## 2011-07-07 DIAGNOSIS — J320 Chronic maxillary sinusitis: Secondary | ICD-10-CM

## 2011-07-07 DIAGNOSIS — M79609 Pain in unspecified limb: Secondary | ICD-10-CM

## 2011-07-07 LAB — COMPREHENSIVE METABOLIC PANEL
ALT: 14 U/L (ref 0–35)
AST: 17 U/L (ref 0–37)
Albumin: 4.3 g/dL (ref 3.5–5.2)
CO2: 25 mEq/L (ref 19–32)
Calcium: 8.8 mg/dL (ref 8.4–10.5)
Chloride: 108 mEq/L (ref 96–112)
Creat: 0.85 mg/dL (ref 0.50–1.10)
Potassium: 4.4 mEq/L (ref 3.5–5.3)
Sodium: 139 mEq/L (ref 135–145)
Total Protein: 6.9 g/dL (ref 6.0–8.3)

## 2011-07-07 MED ORDER — HYDROCODONE-ACETAMINOPHEN 5-325 MG PO TABS: 1.0000 | ORAL_TABLET | Freq: Three times a day (TID) | ORAL | Status: DC | PRN

## 2011-07-07 MED ORDER — CEFUROXIME AXETIL 250 MG PO TABS: 250.0000 mg | ORAL_TABLET | Freq: Two times a day (BID) | ORAL | Status: AC

## 2011-07-07 NOTE — Patient Instructions (Signed)
I have agreed to prescribe your chronic pain medication (Norco 5-325 mg  1 tablet every 8 hours as needed for pain) with the understanding that you will not request this medication from any other health care provider (including Emergency Providers). Early refills will not be provided nor will refill for "lost" medication. I have discussed with you the plan for referral to Pain Management in the future once your situation is more stable.    Vitamin D Deficiency  Not having enough vitamin D is called a deficiency. Your body needs this vitamin to keep your bones strong and healthy. Having too little of it can make your bones soft or can cause other health problems.  HOME CARE  Take all vitamins, herbs, or nutrition drinks (supplements) as told by your doctor.   Have your blood tested 2 months after taking vitamins, herbs, or nutrition drinks.   Eat foods that have vitamin D. This includes:   Dairy products, cereals, or juices with added vitamin D. Check the label.   Fatty fish like salmon or trout.   Eggs.   Oysters.   Go outside for 10 to 15 minutes when the sun is shining. Do this 3 times a week. Do not do this if you have skin cancer.   Do not use tanning beds.   Stay at a healthy weight. Lose weight if needed.   Keep all doctor visits as told.  GET HELP IF:  You have questions.   You continue to have problems.   You feel sick to your stomach (nauseous) or throw up (vomit).   You cannot go poop (constipated).   You feel confused.   You have severe belly (abdominal) or back pain.  MAKE SURE YOU:  Understand these instructions.   Will watch your condition.   Will get help right away if you are not doing well or get worse.  Document Released: 01/14/2011 Document Reviewed: 01/12/2011 [facility] Patient Information 2012 [place], [place].

## 2011-07-08 ENCOUNTER — Other Ambulatory Visit: Payer: Self-pay | Admitting: Family Medicine

## 2011-07-08 LAB — VITAMIN D 25 HYDROXY (VIT D DEFICIENCY, FRACTURES): Vit D, 25-Hydroxy: 18 ng/mL — ABNORMAL LOW (ref 30–89)

## 2011-07-08 MED ORDER — ERGOCALCIFEROL 1.25 MG (50000 UT) PO CAPS: 50000.0000 [IU] | ORAL_CAPSULE | ORAL | Status: DC

## 2011-07-08 NOTE — Progress Notes (Signed)
Quick Note:  Please call pt and advise that the following labs are abnormal...   Vit D is low; prescription for a supplement - 50.000 IU take 1 capsule once a week- is at pt's pharmacy. There are RFs so pt should take this through the summer.  All other labs are normal.  Copy to pt. ______

## 2011-07-09 ENCOUNTER — Ambulatory Visit: Payer: Self-pay | Admitting: Family Medicine

## 2011-07-12 ENCOUNTER — Encounter: Payer: Self-pay | Admitting: Family Medicine

## 2011-07-12 NOTE — Progress Notes (Signed)
  Subjective:    Patient ID: Sylvia Hall, female    DOB: March 08, 1960, 51 y.o.   MRN: [id number]  HPI   This 51 y.o. Cauc female returns for recheck; she c/o "feeling awful" with chronic sinus congestion  and pressure headache, sore throat and nonproductive cough. Also having ongoing musculoskeletal pain  with moderate relief on current pain medications. She points out that she is taking a lower dose of  Hydrocodone/ APAP and this is adequate. Pain interferes with activities and with sleep.    Review of Systems  Constitutional: Positive for activity change, appetite change and fatigue. Negative for fever and chills.  HENT: Positive for ear pain, congestion, sore throat, rhinorrhea, postnasal drip and sinus pressure. Negative for nosebleeds and trouble swallowing.   Respiratory: Positive for cough. Negative for chest tightness, shortness of breath and wheezing.   Cardiovascular: Negative for chest pain.  Musculoskeletal: Positive for back pain and arthralgias.  Neurological: Positive for light-headedness and headaches. Negative for syncope, speech difficulty and numbness.  Psychiatric/Behavioral: Positive for sleep disturbance. The patient is nervous/anxious.        Objective:   Physical Exam  Vitals reviewed. Constitutional: She is oriented to person, place, and time. She appears well-developed and well-nourished. No distress.  HENT:  Head: Normocephalic and atraumatic.  Right Ear: External ear normal.  Left Ear: External ear normal.  Mouth/Throat: Oropharynx is clear and moist.       Sinuses tender with percussion in maxillary area  Eyes: Conjunctivae and EOM are normal. No scleral icterus.  Neck: No thyromegaly present.       Decreased ROM (rotation) and flexion/extension is limited and uncomfortable  Cardiovascular: Normal rate, regular rhythm and normal heart sounds.   No murmur heard. Pulmonary/Chest: Effort normal and breath sounds normal. No respiratory distress. She has no  wheezes.  Lymphadenopathy:    She has no cervical adenopathy.  Neurological: She is alert and oriented to person, place, and time. No cranial nerve deficit.  Skin: Skin is warm and dry. No rash noted.       Complexion is sallow  Psychiatric:       Flat affect with depressed mood          Assessment & Plan:   1. Chronic neck pain  HYDROcodone-acetaminophen (NORCO) 5-325 MG per tablet Advised moist heat and topical analgesic  2. Sinusitis, maxillary, chronic  RX: Cefuroxime 250 mg  1 tablet twice daily Advised pt to consider tobacco cessation as this is contributing to recurrent and chronic sinus problems  3. Musculoskeletal leg pain  Comprehensive metabolic panel, Vitamin D, 25-hydroxy  4. Chronic pain syndrome  Discussed possible referral to Pain Management Clinic once financial situation is clarified (she has applied for Disablity)  5. Hypotension, unspecified -contributes to "not feeling well" Suspect dehydration- encouraged adequate fluid intake; will monitor BP and heart rate and consider reducing Metoprolol dose by half

## 2011-08-06 ENCOUNTER — Ambulatory Visit (INDEPENDENT_AMBULATORY_CARE_PROVIDER_SITE_OTHER): Payer: Self-pay | Admitting: Family Medicine

## 2011-08-06 VITALS — BP 110/79 | HR 87 | Temp 97.0°F | Resp 18 | Ht 63.5 in | Wt 154.8 lb

## 2011-08-06 DIAGNOSIS — E559 Vitamin D deficiency, unspecified: Secondary | ICD-10-CM

## 2011-08-06 DIAGNOSIS — Z789 Other specified health status: Secondary | ICD-10-CM

## 2011-08-06 DIAGNOSIS — E663 Overweight: Secondary | ICD-10-CM

## 2011-08-06 DIAGNOSIS — M542 Cervicalgia: Secondary | ICD-10-CM

## 2011-08-06 DIAGNOSIS — Z888 Allergy status to other drugs, medicaments and biological substances status: Secondary | ICD-10-CM

## 2011-08-06 DIAGNOSIS — G8929 Other chronic pain: Secondary | ICD-10-CM

## 2011-08-06 MED ORDER — ALBUTEROL SULFATE HFA 108 (90 BASE) MCG/ACT IN AERS: 2.0000 | INHALATION_SPRAY | Freq: Four times a day (QID) | RESPIRATORY_TRACT | Status: DC | PRN

## 2011-08-06 MED ORDER — HYDROCODONE-ACETAMINOPHEN 5-325 MG PO TABS: 1.0000 | ORAL_TABLET | Freq: Three times a day (TID) | ORAL | Status: DC | PRN

## 2011-08-06 MED ORDER — ALPRAZOLAM 1 MG PO TABS: 1.0000 mg | ORAL_TABLET | Freq: Three times a day (TID) | ORAL | Status: DC | PRN

## 2011-08-06 NOTE — Patient Instructions (Addendum)
1200 Calorie Diabetic Diet The 1200 calorie diabetic diet limits calories to 1200 each day. Following this diet and making healthy meal choices can help improve overall health. It controls blood glucose (sugar) levels and can also help lower blood pressure and cholesterol.  SERVING SIZES Measuring foods and serving sizes helps to make sure you are getting the right amount of food. The list below tells how big or small some common serving sizes are.   1 oz.........4 stacked dice.   3 oz........Marland KitchenDeck of cards.   1 tsp.......Marland KitchenTip of little finger.   1 tbs......Marland KitchenMarland KitchenThumb.   2 tbs.......Marland KitchenGolf ball.    cup......Marland KitchenHalf of a fist.   1 cup.......Marland KitchenA fist.  GUIDELINES FOR CHOOSING FOODS The goal of this diet is to eat a variety of foods and limit calories to 1200 each day. This can be done by choosing foods that are low in calories and fat. The diet also suggests eating small amounts of food frequently. Doing this helps control your blood glucose levels, so they do not get too high or too low. Each meal or snack may include a protein food source to help you feel more satisfied. Try to eat about the same amount of food around the same time each day. This includes weekend days, travel days, and days off work. Space your meals about 4 to 5 hours apart, and add a snack between them, if you wish.  For example, a daily food plan could include breakfast, a morning snack, lunch, dinner, and an evening snack. Healthy meals and snacks have different types of foods, including whole grains, vegetables, fruits, lean meats, poultry, fish, and dairy products. As you plan your meals, select a variety of foods. Choose from the bread and starch, vegetable, fruit, dairy, and meat/protein groups. Examples of foods from each group are listed below, with their suggested serving sizes. Use measuring cups and spoons to become familiar with what a healthy portion looks like. Bread and Starch Each serving equals 15 grams of  carbohydrate.  1 slice bread.    bagel.    cup cold cereal (unsweetened).    cup hot cereal or mashed potatoes.   1 small potato (size of a computer mouse).   ? cup cooked pasta or rice.    English muffin.   1 cup broth-based soup.   3 cups of popcorn.   4 to 6 whole-wheat crackers.    cup cooked beans, peas, or corn.  Vegetables Each serving equals 5 grams of carbohydrate.   cup cooked vegetables.   1 cup raw vegetables.    cup tomato or vegetable juice.  Fruit Each serving equals 15 grams of carbohydrate.  1 small apple or orange.   1  cup watermelon or strawberries.    cup applesauce (no sugar added).   2 tbs raisins.    banana.    cup canned fruit, packed in water or in its own juice.    cup unsweetened fruit juice.  Dairy Each serving equals 12 to 15 grams of carbohydrate.  1 cup fat-free milk.   6 oz artificially sweetened yogurt or plain yogurt.   1 cup low-fat buttermilk.   1 cup soy milk.   1 cup almond milk.  Meat/Protein  1 large egg.   2 to 3 oz meat, poultry, or fish.    cup low-fat cottage cheese.   1 tbs peanut butter.   1 oz low-fat cheese.    cup tuna, packed in water.    cup tofu.  Fat  1 tsp oil.   1 tsp trans-fat-free margarine.   1 tsp butter.   1 tsp mayonnaise.   2 tbs avocado.   1 tbs salad dressing.   1 tbs cream cheese.   2 tbs sour cream.  SAMPLE 1200 CALORIE DIET PLAN Breakfast  1 cup fat-free milk (1 carb serving).   1 small orange (1 carb serving).   1 scrambled egg.   1 slice whole-wheat toast (1 carb serving).  Lunch  Sandwich   2 slices whole-wheat bread (2 carb servings).   2 oz lean meat.   2 tsp reduced fat mayonnaise.   1 lettuce leaf.   2 slices tomato.   1 cup carrot sticks.  Afternoon Snack  1 small apple (1 carb serving).   1 string cheese.  Dinner  2 oz meat.   1 small baked potato (1 carb serving).   1 tsp trans-fat-free margarine.     1 cup steamed broccoli.   1 cup fat-free milk (1 carb serving).  Evening Snack   small banana (1 carb serving).   6 vanilla wafers (1 carb serving).  MEAL PLAN You can use this worksheet to help you make a daily meal plan based on the 1200 calorie diabetic diet suggestions. If you are using this plan to help you control your blood glucose, you may interchange carbohydrate containing foods (dairy, starches, and fruits). Select a variety of fresh foods of varying colors and flavors. The total amount of carbohydrate in your meals or snacks is more important than making sure you include all of the food groups every time you eat. You can choose from approximately this many of the following foods to build your day's meals:  6 Starches.   3 Vegetables.   2 Fruits.   2 Dairy.   4 to 6 oz Meat/Protein.   Up to 3 Fats.  Your dietician can use this worksheet to help you decide how many servings and which types of foods are right for you. BREAKFAST Food Group and Servings / Food Choice Starch _________________________________________________________ Dairy __________________________________________________________ Fruit ___________________________________________________________ Meat/Protein____________________________________________________ Fat ____________________________________________________________ LUNCH Food Group and Servings / Food Choice  Starch _________________________________________________________ Meat/Protein ___________________________________________________ Vegetables _____________________________________________________ Fruit __________________________________________________________ Dairy __________________________________________________________ Fat ____________________________________________________________ Aura Fey Food Group and Servings / Food Choice Dairy __________________________________________________________ Fruit  ___________________________________________________________ Starch __________________________________________________________ Meat/Protein____________________________________________________ Laural Golden Food Group and Servings / Food Choice Starch _________________________________________________________ Meat/Protein ___________________________________________________ Dairy __________________________________________________________ Vegetables _____________________________________________________ Fruit __________________________________________________________ Fat ____________________________________________________________ Lollie Sails Food Group and Servings / Food Choice Fruit ___________________________________________________________ Meat/Protein ____________________________________________________ Dairy __________________________________________________________ Starch __________________________________________________________ DAILY TOTALS Starches _________________________ Vegetables _______________________ Fruits ____________________________ Dairy ____________________________ Meat/Protein______________________ Fats _____________________________ Document Released: 08/17/2004 Document Revised: 01/14/2011 Document Reviewed: 12/12/2008 [employer] Patient Information 2012 [employer].    VITAMIN D Deficiency: get OTC Vitamin D3 2000 IU and take 1 capsule twice a day every day to replenish this vitamin to normal levels.

## 2011-08-06 NOTE — Progress Notes (Signed)
  Subjective:    Patient ID: Sylvia Hall, female    DOB: 05-20-1960, 51 y.o.   MRN: [id number]  HPI   Pt is here for medication review. She cannot tolerate Vit D 50,000 IU- made joints hurt, had GI upset and   was very lethargic-like the "flu". She continues to have chronic pain involving neck and spine. She has concerns  about inability loss weight; her appetite has improved in last several months but she is not very active and  does not sleep well (less than 6 hours sleep/night).    Review of Systems  Constitutional: Positive for appetite change and fatigue. Negative for fever, activity change and unexpected weight change.  HENT: Positive for neck pain and neck stiffness.   Eyes: Negative for visual disturbance.  Respiratory: Negative for chest tightness and shortness of breath.   Cardiovascular: Negative for chest pain, palpitations and leg swelling.  Musculoskeletal: Positive for back pain and arthralgias. Negative for joint swelling.  Skin: Negative.   Neurological: Positive for weakness. Negative for dizziness, numbness and headaches.  Hematological: Negative.   Psychiatric/Behavioral: Positive for disturbed wake/sleep cycle. The patient is nervous/anxious.        Objective:   Physical Exam  Nursing note and vitals reviewed. Constitutional: She is oriented to person, place, and time. She appears well-developed and well-nourished. No distress.  HENT:  Head: Normocephalic and atraumatic.  Right Ear: External ear normal.  Left Ear: External ear normal.  Eyes: Conjunctivae and EOM are normal. No scleral icterus.  Neck: No thyromegaly present.  Cardiovascular: Normal rate.   Pulmonary/Chest: Effort normal. No respiratory distress.  Musculoskeletal: Normal range of motion. She exhibits tenderness. She exhibits no edema.       Cervical spine with stiffness and decreased ROM; paravertebral spine muscles tender with spasms  Lymphadenopathy:    She has no cervical adenopathy.    Neurological: She is alert and oriented to person, place, and time. No cranial nerve deficit. She exhibits normal muscle tone. Coordination normal.  Skin: Skin is warm and dry. No rash noted. No erythema.  Psychiatric: She has a normal mood and affect. Her behavior is normal. Thought content normal.          Assessment & Plan:   1. Unspecified vitamin D deficiency  Get OTC Vit D3 2000 IU and take daily Incorporate more Vit D containing foods into diet  2. Medication intolerance  Discontinue Vit D 50,000 IU  3. Chronic neck pain  HYDROcodone-acetaminophen (NORCO) 5-325 MG per tablet  4. Chronic pain  This may decrease once Vit D is replenished  5. Overweight (BMI 25.0-29.9)  Discussed importance of good sleep hygiene as well as proper caloric intake to "feed" metabolism

## 2011-08-08 ENCOUNTER — Encounter: Payer: Self-pay | Admitting: Family Medicine

## 2011-08-08 DIAGNOSIS — E559 Vitamin D deficiency, unspecified: Secondary | ICD-10-CM | POA: Insufficient documentation

## 2011-09-02 ENCOUNTER — Telehealth: Payer: Self-pay

## 2011-09-02 NOTE — Telephone Encounter (Signed)
Per Dr. Audria Nine, patient is to call here and request script for Narco 90 tablets call into [facility] on [facility].  Call back number is [phone]

## 2011-09-03 ENCOUNTER — Telehealth: Payer: Self-pay

## 2011-09-03 DIAGNOSIS — G8929 Other chronic pain: Secondary | ICD-10-CM

## 2011-09-03 NOTE — Telephone Encounter (Signed)
Pt called back stating that she really needs to be able to pick up her script for hydrocodone before 3:00 or she will not be able to obtain transportation until next week.  She can be reached on her cell phone.

## 2011-09-03 NOTE — Telephone Encounter (Signed)
PT CALLED REGARDING HER MEDICINE. STATES SHE HAD CALLED YESTERDAY AND WE DIDN'T CALL HER BACK NEITHER DID WE CALL THE PHARMACY WOULD LIKE A CALL BACK ASAP TO [phone]   [facility] [address] IT WAS HER HYDROCODONE

## 2011-09-04 MED ORDER — HYDROCODONE-ACETAMINOPHEN 5-325 MG PO TABS: 1.0000 | ORAL_TABLET | Freq: Three times a day (TID) | ORAL | Status: DC | PRN

## 2011-09-04 NOTE — Telephone Encounter (Signed)
Rx done and picked up by patient.

## 2011-09-05 NOTE — Telephone Encounter (Signed)
I reviewed medication list and pt has RF on Hydrocodone-APAP that can be refilled 09/05/11 (today).

## 2011-09-26 ENCOUNTER — Ambulatory Visit (INDEPENDENT_AMBULATORY_CARE_PROVIDER_SITE_OTHER): Payer: Self-pay | Admitting: Emergency Medicine

## 2011-09-26 VITALS — BP 102/70 | HR 73 | Temp 98.0°F | Resp 18 | Ht 62.5 in | Wt 159.6 lb

## 2011-09-26 DIAGNOSIS — J018 Other acute sinusitis: Secondary | ICD-10-CM

## 2011-09-26 DIAGNOSIS — J209 Acute bronchitis, unspecified: Secondary | ICD-10-CM

## 2011-09-26 MED ORDER — PSEUDOEPHEDRINE-GUAIFENESIN ER 60-600 MG PO TB12: 1.0000 | ORAL_TABLET | Freq: Two times a day (BID) | ORAL | Status: DC

## 2011-09-26 MED ORDER — AMOXICILLIN-POT CLAVULANATE 875-125 MG PO TABS: 1.0000 | ORAL_TABLET | Freq: Two times a day (BID) | ORAL | Status: AC

## 2011-09-26 MED ORDER — HYDROCOD POLST-CHLORPHEN POLST 10-8 MG/5ML PO LQCR: 5.0000 mL | Freq: Two times a day (BID) | ORAL | Status: DC | PRN

## 2011-09-26 NOTE — Progress Notes (Signed)
Date:  09/26/2011   Name:  Claudene Gatliff   DOB:  [birth year]   MRN:  [id number] Gender: female  Age: 51 y.o.  PCP:  Dow Adolph, MD    Chief Complaint: Fever, Otalgia, Cough, Wheezing and Headache   History of Present Illness:  Carolynn Tuley is a 51 y.o. pleasant patient who presents with the following:  Ill 3 days with fever, myalgias, arthralgias, green nasal drainage and post nasal drip, sore throat, intense right ear pain and cough productive purulent sputum.  Up to 103 temp this morning.  Post tussive vomiting.  No nausea.  One episode of diarrhea.  Poor appetite.  No relief with otc meds but fever relief.  Not reliably using her MDI despite wheezing and coughing  Patient Active Problem List  Diagnosis  . Chronic back pain  . MVP (mitral valve prolapse)  . Asthma  . Tobacco user  . Chronic mastitis  . Allergic rhinitis  . Vitamin d deficiency    Past Medical History  Diagnosis Date  . Mild mitral valve prolapse   . Asthma   . Back pain, chronic   . Thrombophlebitis   . Mitral valve prolapse   . Epilepsy     Past Surgical History  Procedure Date  . Back surgery   . Nose surgery     s/p trauma  . Appendectomy   . Cesarean section   . Ovary surgery   . Uterine fibroid surgery   . Breast lumpectomy   . Abdominal hysterectomy   . Breast lumpectomy     Right breast  . Biopsy breast     Right breast x 10  . Rhinoplasty     History  Substance Use Topics  . Smoking status: Current Everyday Smoker -- 1.0 packs/day for 12 years    Types: Cigarettes  . Smokeless tobacco: Not on file  . Alcohol Use: No    No family history on file.  Allergies  Allergen Reactions  . Phenytoin Sodium Extended Other (See Comments)    seizures  . Aspirin Hives  . Caffeine Other (See Comments)    Heart races  . Dilantin (Phenytoin Sodium Extended)   . Food     Sodium nitrates  . Ivp Dye (Iodinated Diagnostic Agents) Hives  . Levofloxacin Hives  . Sodium Nitrate     . Sulfa Drugs Cross Reactors Hives    Medication list has been reviewed and updated.  Current Outpatient Prescriptions on File Prior to Visit  Medication Sig Dispense Refill  . albuterol (PROVENTIL HFA;VENTOLIN HFA) 108 (90 BASE) MCG/ACT inhaler Inhale 2 puffs into the lungs every 6 (six) hours as needed. For shortness of breath  1 Inhaler  3  . ALPRAZolam (XANAX) 1 MG tablet Take 1 tablet (1 mg total) by mouth 3 (three) times daily as needed.  90 tablet  1  . diphenhydrAMINE (BENADRYL) 25 MG tablet Take 25 mg by mouth every 6 (six) hours as needed.      . docusate sodium (COLACE) 100 MG capsule Take 100 mg by mouth 2 (two) times daily.      Marland Kitchen HYDROcodone-acetaminophen (NORCO/VICODIN) 5-325 MG per tablet Take 1 tablet by mouth every 8 (eight) hours as needed. May fill 09/05/11  90 tablet  0  . ibuprofen (ADVIL,MOTRIN) 200 MG tablet Take 800 mg by mouth every 6 (six) hours as needed. For pain      . metoprolol succinate (TOPROL-XL) 50 MG 24 hr tablet Take 1 tablet (50 mg total) by  mouth daily.  90 tablet  3  . ranitidine (ZANTAC) 75 MG tablet Take 75 mg by mouth daily as needed. ACID REFLUX      . Pseudoephedrine-APAP-DM (TYLENOL COLD NO DROWSINESS) 30-325-15 MG TABS Take 2 tablets by mouth every 6 (six) hours as needed. For cold        Review of Systems:  As per HPI, otherwise negative.    Physical Examination: Filed Vitals:   09/26/11 1723  BP: 102/70  Pulse: 73  Temp: 98 F (36.7 C)  Resp: 18   Filed Vitals:   09/26/11 1723  Height: 5' 2.5" (1.588 m)  Weight: 159 lb 9.6 oz (72.394 kg)   Body mass index is 28.73 kg/(m^2). Ideal Body Weight: Weight in (lb) to have BMI = 25: 138.6    GEN: WDWN, NAD, Non-toxic, Alert & Oriented x 3 HEENT: Atraumatic, Normocephalic.  Ears and Nose: No external deformity.  TM's negative EXTR: No clubbing/cyanosis/edema NEURO: Normal gait.  PSYCH: Normally interactive. Conversant. Not depressed or anxious appearing.  Calm demeanor.  Chest:   Bilateral diffuse wheezing.   BS= COR  RRR  Assessment and Plan: Sinusitis and bronchitis with bronchospasm. Use inhaler Augmentin tussionex mucinex d Follow up as needed  Carmelina Dane, MD

## 2011-10-06 ENCOUNTER — Other Ambulatory Visit: Payer: Self-pay | Admitting: Family Medicine

## 2011-10-06 ENCOUNTER — Telehealth: Payer: Self-pay

## 2011-10-06 DIAGNOSIS — G8929 Other chronic pain: Secondary | ICD-10-CM

## 2011-10-06 MED ORDER — HYDROCODONE-ACETAMINOPHEN 5-325 MG PO TABS: 1.0000 | ORAL_TABLET | Freq: Three times a day (TID) | ORAL | Status: DC | PRN

## 2011-10-06 NOTE — Telephone Encounter (Signed)
The patient's husband called to help facilitate his wife getting her rx for Norco.  The patient's husband states she is in a great deal of pain and is laying in bed.  The patient's husband has to be at work in one hour and needs to be able to get to the pharmacy and get the medication to his wife and go to work.  The patient's husband is very upset that his wife has to go through this every month to get this "medically necessary" medication.  Please call the patient or patient's husband at [phone].

## 2011-10-06 NOTE — Telephone Encounter (Signed)
Called to advise Rx at front desk for pick up

## 2011-10-06 NOTE — Telephone Encounter (Signed)
Pt called stating that she tried to get this written rx refilled yesterday but she could not get thru on the phones and kept being switched from one place to another and did not get to speak with someone about this Dr. Audria Nine has told her to call once ready to be refilled for norco that she has to come in and pick up -she states husband had to go to work soon and no way of picking this up and she is out and needs this medicine today   Best number [phone]

## 2011-10-06 NOTE — Telephone Encounter (Signed)
Rx done and ready for pickup 

## 2011-11-03 ENCOUNTER — Ambulatory Visit (INDEPENDENT_AMBULATORY_CARE_PROVIDER_SITE_OTHER): Payer: Self-pay | Admitting: Physician Assistant

## 2011-11-03 VITALS — BP 108/72 | HR 62 | Temp 98.2°F | Resp 16 | Ht 63.25 in | Wt 164.0 lb

## 2011-11-03 DIAGNOSIS — F411 Generalized anxiety disorder: Secondary | ICD-10-CM

## 2011-11-03 DIAGNOSIS — M62838 Other muscle spasm: Secondary | ICD-10-CM

## 2011-11-03 DIAGNOSIS — M79609 Pain in unspecified limb: Secondary | ICD-10-CM

## 2011-11-03 DIAGNOSIS — F419 Anxiety disorder, unspecified: Secondary | ICD-10-CM | POA: Insufficient documentation

## 2011-11-03 DIAGNOSIS — G2581 Restless legs syndrome: Secondary | ICD-10-CM

## 2011-11-03 DIAGNOSIS — M542 Cervicalgia: Secondary | ICD-10-CM

## 2011-11-03 DIAGNOSIS — M79604 Pain in right leg: Secondary | ICD-10-CM

## 2011-11-03 DIAGNOSIS — G8929 Other chronic pain: Secondary | ICD-10-CM

## 2011-11-03 LAB — POCT CBC
HCT, POC: 45.3 % (ref 37.7–47.9)
Lymph, poc: 1.9 (ref 0.6–3.4)
MCHC: 31.3 g/dL — AB (ref 31.8–35.4)
MCV: 103.5 fL — AB (ref 80–97)
MID (cbc): 0.5 (ref 0–0.9)
POC Granulocyte: 3.5 (ref 2–6.9)
POC LYMPH PERCENT: 33 %L (ref 10–50)
RDW, POC: 12.6 %

## 2011-11-03 LAB — COMPREHENSIVE METABOLIC PANEL
ALT: 11 U/L (ref 0–35)
AST: 17 U/L (ref 0–37)
Alkaline Phosphatase: 75 U/L (ref 39–117)
BUN: 13 mg/dL (ref 6–23)
Creat: 0.76 mg/dL (ref 0.50–1.10)
Total Bilirubin: 0.5 mg/dL (ref 0.3–1.2)

## 2011-11-03 LAB — CK: Total CK: 55 U/L (ref 7–177)

## 2011-11-03 LAB — FERRITIN: Ferritin: 108 ng/mL (ref 10–291)

## 2011-11-03 LAB — VITAMIN B12: Vitamin B-12: 384 pg/mL (ref 211–911)

## 2011-11-03 MED ORDER — ALPRAZOLAM 1 MG PO TABS: 1.0000 mg | ORAL_TABLET | Freq: Three times a day (TID) | ORAL | Status: DC

## 2011-11-03 MED ORDER — ROPINIROLE HCL 0.25 MG PO TABS: 0.2500 mg | ORAL_TABLET | Freq: Every day | ORAL | Status: DC

## 2011-11-03 MED ORDER — HYDROCODONE-ACETAMINOPHEN 5-325 MG PO TABS: 1.0000 | ORAL_TABLET | Freq: Three times a day (TID) | ORAL | Status: DC

## 2011-11-03 MED ORDER — CYCLOBENZAPRINE HCL 10 MG PO TABS: 10.0000 mg | ORAL_TABLET | Freq: Every evening | ORAL | Status: DC | PRN

## 2011-11-03 NOTE — Progress Notes (Signed)
Subjective:    Patient ID: Sylvia Hall, female    DOB: 12-22-1960, 51 y.o.   MRN: [id number]  HPI  Pt presents to clinic for chronic medication refill.  She uses vicodin and xanax daily and has been for 25 years.  She also for the last 4 days has had leg pains mainly in her R calf that wakes her up in the middle of the night with cramping that leaves her leg sore the next day.  She has had 2 blood clots and this feels nothing like those, these feel like Charley horses.  Hurts so bad she cannot walk.  She has changed nothing in her diet but has been a little less active recently.  No injury to leg.  She also complains of over the last several months of sensations in both of her legs at night that feel like she constantly has to move them or they are uncomfortable.  These sensations are affecting her sleep.   Review of Systems  Musculoskeletal: Positive for myalgias and back pain. Negative for gait problem.       Objective:   Physical Exam  Vitals reviewed. Constitutional: She is oriented to person, place, and time. She appears well-developed and well-nourished.  HENT:  Head: Normocephalic and atraumatic.  Right Ear: External ear normal.  Left Ear: External ear normal.  Eyes: Conjunctivae normal are normal.  Cardiovascular: Normal rate, regular rhythm and normal heart sounds.   No murmur heard. Pulmonary/Chest: Effort normal and breath sounds normal.  Musculoskeletal: Normal range of motion.       Right lower leg: She exhibits tenderness. She exhibits no swelling and no edema.       Legs: Neurological: She is alert and oriented to person, place, and time.  Skin: Skin is warm and dry.  Psychiatric: She has a normal mood and affect. Her behavior is normal. Thought content normal.    Results for orders placed in visit on 11/03/11  POCT CBC      Component Value Range   WBC 5.9  4.6 - 10.2 K/uL   Lymph, poc 1.9  0.6 - 3.4   POC LYMPH PERCENT 33.0  10 - 50 %L   MID (cbc) 0.5  0 - 0.9    POC MID % 8.1  0 - 12 %M   POC Granulocyte 3.5  2 - 6.9   Granulocyte percent 58.9  37 - 80 %G   RBC 4.38  4.04 - 5.48 M/uL   Hemoglobin 14.2  12.2 - 16.2 g/dL   HCT, POC 78.2  95.6 - 47.9 %   MCV 103.5 (*) 80 - 97 fL   MCH, POC 32.4 (*) 27 - 31.2 pg   MCHC 31.3 (*) 31.8 - 35.4 g/dL   RDW, POC 21.3     Platelet Count, POC 220  142 - 424 K/uL   MPV 8.2  0 - 99.8 fL         Assessment & Plan:   1. Chronic neck pain  HYDROcodone-acetaminophen (NORCO/VICODIN) 5-325 MG per tablet  2. Anxiety  ALPRAZolam (XANAX) 1 MG tablet  3. Muscle spasm of right leg  Comprehensive metabolic panel, CK, cyclobenzaprine (FLEXERIL) 10 MG tablet, Vitamin B12  4. Leg pain, right  Comprehensive metabolic panel, CK, cyclobenzaprine (FLEXERIL) 10 MG tablet  5. Restless legs  POCT CBC, Iron and TIBC, Ferritin, rOPINIRole (REQUIP) 0.25 MG tablet, Vitamin B12   1- refilled pain meds - ok to refill 2 times  2- refilled meds -  ok to refill 2 times 3- Pt to try stretches, flexeril.  If any swelling, erythema or constant pain - rtc immediately 4- related to muscle spasm of gastrocnemius 5- start medication titration - pt to call in 1 month with her status for either med refill or continued titration  Pt to f/u with Dr. Audria Nine in 3 months.  D/w pt that it is best that she have her chronic pain care with only 1 provider, discussed ways that patient can do this.

## 2011-11-03 NOTE — Patient Instructions (Addendum)
Muscle Spasm - stretch legs, increase fluids, potassium in diet with food or gatoraid. If any leg swelling, erythema or change in pain pt to RTC immediately.   Pt to call for Rx refills in 1 month. Pt to call with Requip dose to help her with the titration for her probably restless leg.

## 2011-11-29 ENCOUNTER — Telehealth: Payer: Self-pay

## 2011-11-29 DIAGNOSIS — M79604 Pain in right leg: Secondary | ICD-10-CM

## 2011-11-29 DIAGNOSIS — M542 Cervicalgia: Secondary | ICD-10-CM

## 2011-11-29 DIAGNOSIS — F419 Anxiety disorder, unspecified: Secondary | ICD-10-CM

## 2011-11-29 DIAGNOSIS — M62838 Other muscle spasm: Secondary | ICD-10-CM

## 2011-11-29 MED ORDER — CYCLOBENZAPRINE HCL 10 MG PO TABS: 10.0000 mg | ORAL_TABLET | Freq: Every evening | ORAL | Status: DC | PRN

## 2011-11-29 MED ORDER — HYDROCODONE-ACETAMINOPHEN 5-325 MG PO TABS: 1.0000 | ORAL_TABLET | Freq: Three times a day (TID) | ORAL | Status: DC

## 2011-11-29 MED ORDER — ALPRAZOLAM 1 MG PO TABS: 1.0000 mg | ORAL_TABLET | Freq: Three times a day (TID) | ORAL | Status: DC

## 2011-11-29 NOTE — Telephone Encounter (Signed)
At tl desk 

## 2011-11-29 NOTE — Telephone Encounter (Signed)
Left message for patient to return call.

## 2011-11-29 NOTE — Telephone Encounter (Signed)
PATIENT CALLED REQUESTING RX REFILLS ON: cyclobenzaprine (FLEXERIL) 10 MG tablet, ALPRAZolam (XANAX) 1 MG tablet, HYDROcodone-acetaminophen (NORCO/VICODIN) 5-325 MG per tablet,   AND THAT SHE SAW WEBER LAST OFFICE VISIT.  SHE ALSO WANTED TO INFORM THAT THE REQUIP DID NOT "WORK OUT AND SHE STOPPED TAKING IT". PLEASE LET PATIENT KNOW IF SHE CAN HAVE THESE REFILLS AND THAT IF IT CAN BE AVAILABLE ON THESE DATES FOR HER TO PICK UP: THE 23RD- 24TH OF October. THANK YOU! PLEASE CALL PATIENT FOR FURTHER QUESTIONS.

## 2011-11-30 NOTE — Telephone Encounter (Signed)
LMOM that prescriptions are ready to be picked up.  Return call if she has any questions.

## 2011-12-29 ENCOUNTER — Telehealth: Payer: Self-pay

## 2011-12-29 DIAGNOSIS — M542 Cervicalgia: Secondary | ICD-10-CM

## 2011-12-29 DIAGNOSIS — F419 Anxiety disorder, unspecified: Secondary | ICD-10-CM

## 2011-12-29 NOTE — Telephone Encounter (Signed)
[name] TOLD HER TO CALL FOR HER REFILLS A FEW DAYS BEFORE THEY RAN OUT.  SHE NEEDS HER ALPRAZOLAM, XANAX AND HYDROCODONE REFILLED.  [phone]

## 2011-12-30 ENCOUNTER — Telehealth: Payer: Self-pay

## 2011-12-30 ENCOUNTER — Other Ambulatory Visit: Payer: Self-pay | Admitting: Family Medicine

## 2011-12-30 DIAGNOSIS — G8929 Other chronic pain: Secondary | ICD-10-CM

## 2011-12-30 DIAGNOSIS — F419 Anxiety disorder, unspecified: Secondary | ICD-10-CM

## 2011-12-30 MED ORDER — HYDROCODONE-ACETAMINOPHEN 5-325 MG PO TABS: 1.0000 | ORAL_TABLET | Freq: Three times a day (TID) | ORAL | Status: DC

## 2011-12-30 MED ORDER — ALPRAZOLAM 1 MG PO TABS: 1.0000 mg | ORAL_TABLET | Freq: Three times a day (TID) | ORAL | Status: DC

## 2011-12-30 NOTE — Telephone Encounter (Signed)
PT STATES SHE WILL BE RUNNING OUT OF HER MEDS ON THE 23RD, AND THOUGHT SARAH WOULD HAVE WRITTEN THEM FOR P/U PLEASE CALL [phone]

## 2011-12-30 NOTE — Telephone Encounter (Signed)
Routed to Dr Audria Nine already. Left message for patient, to advise these have been routed.

## 2011-12-31 ENCOUNTER — Other Ambulatory Visit: Payer: Self-pay | Admitting: Family Medicine

## 2011-12-31 DIAGNOSIS — M62838 Other muscle spasm: Secondary | ICD-10-CM

## 2011-12-31 DIAGNOSIS — M79604 Pain in right leg: Secondary | ICD-10-CM

## 2011-12-31 MED ORDER — CYCLOBENZAPRINE HCL 10 MG PO TABS: 10.0000 mg | ORAL_TABLET | Freq: Every evening | ORAL | Status: DC | PRN

## 2012-01-27 ENCOUNTER — Telehealth: Payer: Self-pay | Admitting: Radiology

## 2012-01-27 DIAGNOSIS — I341 Nonrheumatic mitral (valve) prolapse: Secondary | ICD-10-CM

## 2012-01-27 MED ORDER — METOPROLOL SUCCINATE ER 50 MG PO TB24: 50.0000 mg | ORAL_TABLET | Freq: Every day | ORAL | Status: DC

## 2012-01-27 NOTE — Telephone Encounter (Signed)
Patient states she needs Hydrocodone, Xanax, and Flexeril states she has made appt to see you on Jan 2nd.

## 2012-01-28 ENCOUNTER — Telehealth: Payer: Self-pay

## 2012-01-28 ENCOUNTER — Other Ambulatory Visit: Payer: Self-pay | Admitting: Family Medicine

## 2012-01-28 DIAGNOSIS — M542 Cervicalgia: Secondary | ICD-10-CM

## 2012-01-28 DIAGNOSIS — M79604 Pain in right leg: Secondary | ICD-10-CM

## 2012-01-28 DIAGNOSIS — F419 Anxiety disorder, unspecified: Secondary | ICD-10-CM

## 2012-01-28 DIAGNOSIS — M62838 Other muscle spasm: Secondary | ICD-10-CM

## 2012-01-28 MED ORDER — CYCLOBENZAPRINE HCL 10 MG PO TABS: 10.0000 mg | ORAL_TABLET | Freq: Every evening | ORAL | Status: DC | PRN

## 2012-01-28 MED ORDER — ALPRAZOLAM 1 MG PO TABS: 1.0000 mg | ORAL_TABLET | Freq: Three times a day (TID) | ORAL | Status: DC

## 2012-01-28 MED ORDER — HYDROCODONE-ACETAMINOPHEN 5-325 MG PO TABS: 1.0000 | ORAL_TABLET | Freq: Three times a day (TID) | ORAL | Status: DC

## 2012-01-28 MED ORDER — ALBUTEROL SULFATE HFA 108 (90 BASE) MCG/ACT IN AERS: 2.0000 | INHALATION_SPRAY | Freq: Four times a day (QID) | RESPIRATORY_TRACT | Status: DC | PRN

## 2012-01-28 NOTE — Telephone Encounter (Signed)
Patient has called x4 now in past 2 days for this medication.

## 2012-01-28 NOTE — Telephone Encounter (Signed)
Please note, patient called back and states gave the wrong call back number, please call pt at [phone] which is her home number

## 2012-01-28 NOTE — Telephone Encounter (Signed)
I called pt about medications/ refills requested. I phoned in 1 refill for Alprazolam 1 mg  #90 and HC-APAP # 90. Other meds were e-prescribed. Pt has appt in Jan 2014.

## 2012-01-28 NOTE — Telephone Encounter (Signed)
This patient states she called yesterday to get a refill on 4 medications, until her recheck with dr Audria Nine. Only one was called in . Pt states as of today, she will be out of flexeril, xanax and hydrocodone and needs today. Please call pt to advise

## 2012-02-04 ENCOUNTER — Ambulatory Visit: Payer: Self-pay | Admitting: Family Medicine

## 2012-02-09 DIAGNOSIS — Z0271 Encounter for disability determination: Secondary | ICD-10-CM

## 2012-02-10 ENCOUNTER — Ambulatory Visit: Payer: Self-pay

## 2012-02-10 ENCOUNTER — Other Ambulatory Visit (HOSPITAL_COMMUNITY): Payer: Self-pay | Admitting: Physician Assistant

## 2012-02-10 ENCOUNTER — Ambulatory Visit (HOSPITAL_COMMUNITY)
Admission: RE | Admit: 2012-02-10 | Discharge: 2012-02-10 | Disposition: A | Discharge status: Home / Self Care | Payer: Self-pay | Source: Ambulatory Visit | Attending: Family Medicine | Admitting: Family Medicine

## 2012-02-10 ENCOUNTER — Ambulatory Visit (INDEPENDENT_AMBULATORY_CARE_PROVIDER_SITE_OTHER): Payer: Self-pay | Admitting: Family Medicine

## 2012-02-10 ENCOUNTER — Encounter: Payer: Self-pay | Admitting: Physician Assistant

## 2012-02-10 ENCOUNTER — Telehealth: Payer: Self-pay | Admitting: Radiology

## 2012-02-10 VITALS — BP 132/70 | HR 101 | Temp 97.7°F | Resp 16 | Ht 63.0 in | Wt 181.0 lb

## 2012-02-10 DIAGNOSIS — R609 Edema, unspecified: Secondary | ICD-10-CM | POA: Insufficient documentation

## 2012-02-10 DIAGNOSIS — M79609 Pain in unspecified limb: Secondary | ICD-10-CM

## 2012-02-10 DIAGNOSIS — M79606 Pain in leg, unspecified: Secondary | ICD-10-CM

## 2012-02-10 DIAGNOSIS — M549 Dorsalgia, unspecified: Secondary | ICD-10-CM

## 2012-02-10 DIAGNOSIS — M7989 Other specified soft tissue disorders: Secondary | ICD-10-CM

## 2012-02-10 DIAGNOSIS — R600 Localized edema: Secondary | ICD-10-CM

## 2012-02-10 DIAGNOSIS — M79604 Pain in right leg: Secondary | ICD-10-CM

## 2012-02-10 DIAGNOSIS — M79605 Pain in left leg: Secondary | ICD-10-CM

## 2012-02-10 LAB — POCT CBC
Hemoglobin: 13.3 g/dL (ref 12.2–16.2)
Lymph, poc: 2 (ref 0.6–3.4)
MCHC: 31.7 g/dL — AB (ref 31.8–35.4)
MCV: 100 fL — AB (ref 80–97)
POC Granulocyte: 3.1 (ref 2–6.9)
POC MID %: 8.2 %M (ref 0–12)
Platelet Count, POC: 194 10*3/uL (ref 142–424)
RBC: 4.2 M/uL (ref 4.04–5.48)
RDW, POC: 12.5 %
WBC: 5.5 10*3/uL (ref 4.6–10.2)

## 2012-02-10 LAB — COMPREHENSIVE METABOLIC PANEL
AST: 27 U/L (ref 0–37)
Albumin: 3.9 g/dL (ref 3.5–5.2)
Alkaline Phosphatase: 80 U/L (ref 39–117)
BUN: 13 mg/dL (ref 6–23)
Creat: 0.83 mg/dL (ref 0.50–1.10)
Potassium: 4 mEq/L (ref 3.5–5.3)
Total Bilirubin: 0.4 mg/dL (ref 0.3–1.2)

## 2012-02-10 MED ORDER — CARISOPRODOL 350 MG PO TABS: 350.0000 mg | ORAL_TABLET | Freq: Three times a day (TID) | ORAL | Status: DC | PRN

## 2012-02-10 NOTE — Telephone Encounter (Signed)
Patient advised, states she is expecting call from Sylvia Hall for plan.

## 2012-02-10 NOTE — Progress Notes (Signed)
VASCULAR LAB PRELIMINARY  PRELIMINARY  PRELIMINARY  PRELIMINARY  Bilateral lower extremity venous duplex completed.    Preliminary report: Negative for deep and superficial vein thrombosis bilaterally.  Report called to [facility] at Urgent Care.  Hortencia Martire, RVT 02/10/2012, 5:33 PM

## 2012-02-10 NOTE — Telephone Encounter (Signed)
Message copied by Caffie Damme on Thu Feb 10, 2012  5:41 PM ------      Message from: Neva Seat, JEFFREY R      Created: Thu Feb 10, 2012  5:32 PM       Please advise patient that doppler did not show any DVT.  Also the heart failure test and electrolytes were normal -- follow up as planned with Maralyn Sago.

## 2012-02-10 NOTE — Patient Instructions (Signed)
Doppler for both legs today at [facility] - in admitting (1st floor) We checked for heart related causes of swelling with labs.  We will decided treatment based on results of labs/xray report and ultrasound of legs.

## 2012-02-10 NOTE — Progress Notes (Signed)
[address] [place] [address]   Phone [phone]  Subjective:    Patient ID: Sylvia Hall, female    DOB: Dec 29, 1960, 52 y.o.   MRN: [id number]  HPI Pt presents to clinic with 2 wk h/o progressive bilateral leg swelling.  She had nothing change when the swelling started but it is getting painful, burning in the skin due to tightness.  Yesterday was the worse - she elevated her legs some last night and her feet swelling did improve some but her legs did not.  She has been sitting more than normal at the computer but she has had no travel or injury to her legs.  She has h/o unilateral DVT during pregnancy but this feels different, she dd not have testing for blood disorders at that time.  Her skin on her legs currently have burning type pain and are slightly red. She has h/o MVP.  She has no SOB or CP.  She has never had problems with swelling before.  She states that she needs to change meds for her chronic neck and back pain from disc disease and stenosis.  She has been on Norco 5/325 but before she moved here she was on 10/325 and would like to go back to that dose.  See also does not feel like the Flexeril is working and would like to go back on [employer].  Review of Systems  Constitutional: Negative for fever and chills.  Respiratory: Negative for cough and shortness of breath.   Cardiovascular: Positive for leg swelling. Negative for chest pain.  Musculoskeletal: Negative for joint swelling and gait problem.       Objective:   Physical Exam  Vitals reviewed. Constitutional: She appears well-developed and well-nourished.  HENT:  Head: Normocephalic and atraumatic.  Right Ear: External ear normal.  Left Ear: External ear normal.  Eyes: Conjunctivae normal are normal.  Cardiovascular: Normal rate, regular rhythm, normal heart sounds, intact distal pulses and normal pulses.   Pulmonary/Chest: Effort normal. She has wheezes.  Musculoskeletal:       Right lower leg: She exhibits  tenderness, swelling and edema. She exhibits no deformity.       Left lower leg: She exhibits tenderness, swelling and edema (1+ pitting edema up to proxmial 1/3 shin with some darker discoloration distal 1/2 of lower leg,  no lesions). She exhibits no bony tenderness, no deformity and no laceration.       1+ pitting edema up distal 2/3 lower leg bilateral with some darker discoloration distal 1/2 of lower leg,  no lesions seen.  Good pedal pulses.  TTP over calves and into popliteal space.  No obvious cord palpable.  Some TTP with testing for pitting edema but most of pain in calf area.  Skin: Skin is warm and dry. Rash noted. There is erythema.  Psychiatric: She has a normal mood and affect. Her behavior is normal. Judgment and thought content normal.   [facility] reading (PRIMARY) by  Dr. Neva Seat. Slight increased interstitial markings but no change compared with 3/13.  EKG - no acute findings - see scanned media      Assessment & Plan:   1. Bilateral leg edema  EKG 12-Lead, DG Chest 2 View, POCT CBC, Comprehensive metabolic panel, Brain natriuretic peptide, TSH, Lower Extremity Venous Duplex Bilateral  2. Bilateral leg pain  Lower Extremity Venous Duplex Bilateral  3. Back pain  carisoprodol (SOMA) 350 MG tablet    1- Pt has had a 17# weight gain since her  last visit 9/13, I am unsure if this is related to swelling or holiday weight gain though I do not expect all of it to be explained my mild swelling that patient has on exam. Will r/u DVT due to calf pain and tachycardia but think this is a fluid problem possibly from venous insufficiency especially with the discoloration and patients lack of physical activity resently.  Will wait for CXR over read and labs due to mild concern for CHF. Will hold on treatment until we get results - but if as we expect and it is venous insufficiency will treat with mild diuretic and compression hose.  She may need a vein specialist in the future.  Pt will go to  doppler appt and then elevate her legs and try gentle massage distal to proximal (if neg doppler) until tomorrow when we can decided on her treatment based on her labs and xray results.  3- Is regards to her back pain - I told patient that we should only make 1 medication change at a time and we will change to [facility] and stop the flexeril at this visit. Ultimately she will either need a back specialist or maybe even pain management if there is no surgical option for her back problems, though her last specialist recommended surgery and the patient declined at that time.

## 2012-02-10 NOTE — Progress Notes (Signed)
Xray read and patient discussed with Ms. Weber. Agree with assessment and plan of care per her note. CXR report: IMPRESSION:  No acute cardiopulmonary disease. Results for orders placed in visit on 02/10/12  POCT CBC      Component Value Range   WBC 5.5  4.6 - 10.2 K/uL   Lymph, poc 2.0  0.6 - 3.4   POC LYMPH PERCENT 35.8  10 - 50 %L   MID (cbc) 0.5  0 - 0.9   POC MID % 8.2  0 - 12 %M   POC Granulocyte 3.1  2 - 6.9   Granulocyte percent 56.0  37 - 80 %G   RBC 4.20  4.04 - 5.48 M/uL   Hemoglobin 13.3  12.2 - 16.2 g/dL   HCT, POC 40.9  81.1 - 47.9 %   MCV 100.0 (*) 80 - 97 fL   MCH, POC 31.7 (*) 27 - 31.2 pg   MCHC 31.7 (*) 31.8 - 35.4 g/dL   RDW, POC 91.4     Platelet Count, POC 194  142 - 424 K/uL   MPV 8.0  0 - 99.8 fL  COMPREHENSIVE METABOLIC PANEL      Component Value Range   Sodium 141  135 - 145 mEq/L   Potassium 4.0  3.5 - 5.3 mEq/L   Chloride 106  96 - 112 mEq/L   CO2 25  19 - 32 mEq/L   Glucose, Bld 83  70 - 99 mg/dL   BUN 13  6 - 23 mg/dL   Creat 7.82  9.56 - 2.13 mg/dL   Total Bilirubin 0.4  0.3 - 1.2 mg/dL   Alkaline Phosphatase 80  39 - 117 U/L   AST 27  0 - 37 U/L   ALT 23  0 - 35 U/L   Total Protein 6.4  6.0 - 8.3 g/dL   Albumin 3.9  3.5 - 5.2 g/dL   Calcium 8.9  8.4 - 08.6 mg/dL  BRAIN NATRIURETIC PEPTIDE      Component Value Range   Brain Natriuretic Peptide 24.7  0.0 - 100.0 pg/mL

## 2012-02-11 MED ORDER — FUROSEMIDE 20 MG PO TABS: 10.0000 mg | ORAL_TABLET | Freq: Every day | ORAL | Status: DC

## 2012-02-11 NOTE — Progress Notes (Signed)
Pt's labs and doppler were normal.  Expect the swelling to be do due to increased inactivity and some mild venous insufficiency.  I left a message on the patient cell number that we will use Lasix 10mg  qam for the next 3 days and then will recheck.  If the patients swelling has resolved no treatment is needed at this time.  Pt to call with any questions.

## 2012-02-11 NOTE — Addendum Note (Signed)
Addended by: Morrell Riddle on: 02/11/2012 05:03 PM   Modules accepted: Orders

## 2012-02-14 ENCOUNTER — Telehealth: Payer: Self-pay

## 2012-02-14 NOTE — Telephone Encounter (Signed)
Patient advised since her legs are still swelling to try the Lasix and call me and let me know if this is helpful.

## 2012-02-14 NOTE — Telephone Encounter (Signed)
FYI

## 2012-02-14 NOTE — Telephone Encounter (Signed)
PT STATES SARAH WEBER HAD GIVEN HER SOME MEDICINE TO TRY AND GET BACK WITH HER TO SEE HOW IT WORKS, SHE HAVEN'T STARTING TAKING IT YET BUT WOULD LIKE A CALL BACK FIRST. PLEASE CALL [phone]

## 2012-02-26 ENCOUNTER — Telehealth: Payer: Self-pay

## 2012-02-26 DIAGNOSIS — I341 Nonrheumatic mitral (valve) prolapse: Secondary | ICD-10-CM

## 2012-02-26 DIAGNOSIS — M542 Cervicalgia: Secondary | ICD-10-CM

## 2012-02-26 DIAGNOSIS — F419 Anxiety disorder, unspecified: Secondary | ICD-10-CM

## 2012-02-26 NOTE — Telephone Encounter (Signed)
Needs refill on alprazolam,metoprolol,flexeril,hydrocodeine. Says that she called pharmacy.  [facility] [address]

## 2012-02-27 ENCOUNTER — Telehealth: Payer: Self-pay

## 2012-02-27 DIAGNOSIS — I341 Nonrheumatic mitral (valve) prolapse: Secondary | ICD-10-CM

## 2012-02-27 MED ORDER — ALPRAZOLAM 1 MG PO TABS: 1.0000 mg | ORAL_TABLET | Freq: Three times a day (TID) | ORAL | Status: DC

## 2012-02-27 MED ORDER — METOPROLOL SUCCINATE ER 50 MG PO TB24: 50.0000 mg | ORAL_TABLET | Freq: Every day | ORAL | Status: DC

## 2012-02-27 MED ORDER — HYDROCODONE-ACETAMINOPHEN 5-325 MG PO TABS: 1.0000 | ORAL_TABLET | Freq: Three times a day (TID) | ORAL | Status: DC

## 2012-02-27 NOTE — Telephone Encounter (Signed)
Pt called stated she called yesterday in regards to med refills on all prescriptions. Please call pt back to let know when done. [facility] [address] Pt phone [phone]

## 2012-02-27 NOTE — Telephone Encounter (Signed)
Patients husband called back, very angry. His wife called in yesterday for renewals on Hydrocodone, Xanax , and Flexeril. I have advised him these were sent to Dr Audria Nine to advise, when patient requested these last month she was advised of our policy for refills. He cursed and raised his voice at me for over 10 minutes. He states he is afraid his wife will have seizures if she runs out of these medications, she has been taking these for 20 years. . I advised him 3x we are available until 6 pm, if he feels as if his wife is having an emergency. He is also advised again, the requests have gone to Dr Audria Nine, he also wants me to sed this to Benny Lennert, however Dr Audria Nine is the primary provider, and refills should come from her, per protocol. I will talk to Benny Lennert about this also.   Dr Audria Nine, patients husband was very disrespectful on the phone, and I asked him twice to not curse at me and he continued. He was very difficult to deal with, and I did disconnect the call, because he kept cursing and raising his voice. I did advise him I had 2 other calls on hold, and he could not continue to tie up the phone with his tirade.

## 2012-02-27 NOTE — Telephone Encounter (Signed)
I called medications to [facility] as requested. I did not refill Flexeril; pt just picked up Soma on 02/14/12. Her last appt w/ me was in June 2013. She must sch an appt w/ me; otherwise I will not authorize any more refills.

## 2012-02-27 NOTE — Telephone Encounter (Signed)
Husband called in for pt states wife needs meds today. He does not want her going to hospital. Husband  Stated pharmacy closes at 6:00. Call back asap [phone]

## 2012-02-27 NOTE — Telephone Encounter (Signed)
I called in pt's medication request for Alprazolam and HC-APAP as well as Metoprolol. Pt must sch appt w/ me; she was last seen by me in June 2013. I will address husband's behavior on phone when (and if) she comes in. I will not authorized any more controlled substances until she sees me in at [facility]. I think this type of behavior is cause for consideration of dismissal. I also need to discuss referral to Pain Management w/ pt.

## 2012-02-27 NOTE — Telephone Encounter (Signed)
Forwarding to primary 

## 2012-02-28 NOTE — Telephone Encounter (Signed)
mssg has been sent to Dr Audria Nine and also to PA pool.

## 2012-02-28 NOTE — Telephone Encounter (Signed)
I asked about how to handle unacceptable behavior from pts and/or persons calling on their behalf at Provider meeting on 02/27/2012; this issue needs to be discussed with Dr. Katrinka Blazing and Jill Side. As per my note, I feel like this situation is grounds for dismissal from the practice. I will be setting up a meeting time with Dr. Katrinka Blazing and I will need involved person(s) to be present to witness what occurred during this incident.

## 2012-02-28 NOTE — Telephone Encounter (Signed)
I can be present at meeting if necessary, Dr Cleta Alberts heard my part of the conversation as well. Sylvia Hall

## 2012-02-28 NOTE — Telephone Encounter (Signed)
Thank you, I have called patient to advise. She was transferred to appt scheduling. She was thankful, and agreeable to appt.

## 2012-02-29 ENCOUNTER — Telehealth: Payer: Self-pay

## 2012-02-29 NOTE — Telephone Encounter (Signed)
Patient had two missed calls   [phone]

## 2012-03-02 ENCOUNTER — Ambulatory Visit: Payer: Self-pay | Admitting: Family Medicine

## 2012-03-08 ENCOUNTER — Ambulatory Visit: Payer: Self-pay | Admitting: Family Medicine

## 2012-03-10 ENCOUNTER — Other Ambulatory Visit: Payer: Self-pay | Admitting: *Deleted

## 2012-03-10 ENCOUNTER — Ambulatory Visit (INDEPENDENT_AMBULATORY_CARE_PROVIDER_SITE_OTHER): Payer: Self-pay | Admitting: Family Medicine

## 2012-03-10 ENCOUNTER — Encounter: Payer: Self-pay | Admitting: Family Medicine

## 2012-03-10 VITALS — BP 116/86 | HR 88 | Temp 97.9°F | Resp 16 | Ht 63.0 in | Wt 177.0 lb

## 2012-03-10 DIAGNOSIS — F419 Anxiety disorder, unspecified: Secondary | ICD-10-CM

## 2012-03-10 DIAGNOSIS — I341 Nonrheumatic mitral (valve) prolapse: Secondary | ICD-10-CM

## 2012-03-10 DIAGNOSIS — E559 Vitamin D deficiency, unspecified: Secondary | ICD-10-CM

## 2012-03-10 DIAGNOSIS — R600 Localized edema: Secondary | ICD-10-CM

## 2012-03-10 DIAGNOSIS — M542 Cervicalgia: Secondary | ICD-10-CM

## 2012-03-10 DIAGNOSIS — G8929 Other chronic pain: Secondary | ICD-10-CM

## 2012-03-10 DIAGNOSIS — I059 Rheumatic mitral valve disease, unspecified: Secondary | ICD-10-CM

## 2012-03-10 DIAGNOSIS — R202 Paresthesia of skin: Secondary | ICD-10-CM

## 2012-03-10 DIAGNOSIS — F411 Generalized anxiety disorder: Secondary | ICD-10-CM

## 2012-03-10 DIAGNOSIS — R209 Unspecified disturbances of skin sensation: Secondary | ICD-10-CM

## 2012-03-10 DIAGNOSIS — R609 Edema, unspecified: Secondary | ICD-10-CM

## 2012-03-10 DIAGNOSIS — M549 Dorsalgia, unspecified: Secondary | ICD-10-CM

## 2012-03-10 MED ORDER — ALPRAZOLAM 1 MG PO TABS: 1.0000 mg | ORAL_TABLET | Freq: Three times a day (TID) | ORAL | Status: DC

## 2012-03-10 MED ORDER — HYDROCODONE-ACETAMINOPHEN 5-325 MG PO TABS: 1.0000 | ORAL_TABLET | Freq: Three times a day (TID) | ORAL | Status: DC

## 2012-03-10 MED ORDER — CARISOPRODOL 350 MG PO TABS: 350.0000 mg | ORAL_TABLET | Freq: Three times a day (TID) | ORAL | Status: DC | PRN

## 2012-03-10 MED ORDER — FUROSEMIDE 20 MG PO TABS: ORAL_TABLET | ORAL | Status: DC

## 2012-03-10 NOTE — Progress Notes (Signed)
Subjective:    Patient ID: Sylvia Hall, female    DOB: 08/19/1960, 52 y.o.   MRN: [id number]  HPI  This 52 y.o. Cauc female has chronic erythematous discoloration with numbness and tingling  sensation in lower legs; swelling has subsided Furosemide (prescribed for 3 days) -see note  from 02/10/12 visit at [facility]. She is sedentary, sitting at computer for several hours daily.  Weight is down since last visit; pt's appetite is decreased also.    Pt c/o increasing palpitations attributed to MVP; last evaluation of this problem > 4 years ago in  [place]. Currently taking B-blocker (Metoprolol) but pt wants to resume Inderal. Increased  symptoms may be related to increased anxiety- "There's a lot of things going on".  She has applied for disability, according to the female friend who is with her today.   Pt has a documented Vit D def but could not tolerate 50,000- unit supplement prescribed in  June 2013; by her estimate, her current Vit D intake = <1800 units daily.   Pt continues to smoke < 1ppd.   Review of Systems  Constitutional: Positive for activity change, appetite change and fatigue. Negative for fever.  HENT: Negative for trouble swallowing.   Respiratory: Positive for chest tightness. Negative for cough and shortness of breath.   Cardiovascular: Positive for palpitations and leg swelling. Negative for chest pain.  Gastrointestinal: Negative.        Objective:   Physical Exam  Nursing note and vitals reviewed. Constitutional: She is oriented to person, place, and time. She appears well-developed and well-nourished. She does not have a sickly appearance. No distress.  HENT:  Head: Normocephalic and atraumatic.  Right Ear: External ear normal.  Left Ear: External ear normal.  Nose: Nose normal.  Mouth/Throat: Oropharynx is clear and moist.  Eyes: Conjunctivae normal and EOM are normal. Pupils are equal, round, and reactive to light. No scleral icterus.  Neck: Normal range  of motion. Neck supple.  Cardiovascular: Normal rate, regular rhythm and normal heart sounds.  Exam reveals no gallop and no friction rub.   No murmur heard. Pulmonary/Chest: Effort normal and breath sounds normal. No respiratory distress. She has no wheezes.  Musculoskeletal: She exhibits edema and tenderness.       Lower ext- distal 1/2 of both legs erythematous and edematous.  Neurological: She is alert and oriented to person, place, and time. No cranial nerve deficit. Coordination normal.  Skin: Skin is warm and dry. There is erythema. There is pallor.  Psychiatric: Her speech is normal. Judgment and thought content normal. Her mood appears not anxious. Her affect is not angry and not labile. She is slowed. She is not agitated. Cognition and memory are normal. She exhibits a depressed mood. She is attentive.          Assessment & Plan:   1. Vitamin D deficiency disease  Vitamin D, 25-hydroxy  2. Bilateral leg edema - given pt's smoking hx, I suspect she has some degree of PAD Lower Extremity Arterial Duplex Bilateral  furosemide (LASIX) 20 MG tablet  Refill- 1/2 tab every AM as needed for swelling.  3. MVP (mitral valve prolapse) - recent exacerbation related to increase anxiety Continue current B-blocker  4. Paresthesia of bilateral legs  Vitamin B12, Lower Extremity Arterial Duplex Bilateral  5. Chronic neck pain  HYDROcodone-acetaminophen (NORCO/VICODIN) 5-325 MG per tablet  6. Back pain  carisoprodol (SOMA) 350 MG tablet  7. Anxiety  ALPRAZolam (XANAX) 1 MG tablet  Due to inappropriate and disrespectful behavior towards a staff member during a telephone call by the pt's female friend (who accompanied her today), the pt is advised that she is discharged from the practice.  I had a lengthy discussion with the pt and her friend aboutthis decision; the incident has been discussed w/ the Well[employer] geologist.  The pt is directed to seek care at the new [facility] for uninsured  individuals; this facility should be open to patientts within the next month.  The pt is provided with adequate medication refills until she is established with another medical facility. She and her friend voiced understanding; she has requested a copy of her medical records for disability purposes.

## 2012-03-10 NOTE — Patient Instructions (Addendum)
Peripheral Vascular Disease  Peripheral vascular disease (PVD) is caused by cholesterol buildup in the arteries. The arteries become narrow or clogged. This makes it hard for blood to flow. It happens most in the legs, but it can occur in other areas of your body. HOME CARE   Quit smoking, if you smoke.  Exercise as told by your doctor.  Follow a low-fat, low-cholesterol diet as told by your doctor.  Control your diabetes, if you have diabetes.  Care for your feet to prevent infection.  Only take medicine as told by your doctor. GET HELP RIGHT AWAY IF:   You have pain or lose feeling (numbness) in your arms or legs.  Your arms or legs turn cold or blue.  You have redness, warmth, and puffiness (swelling) in your arms or legs. MAKE SURE YOU:   Understand these instructions.  Will watch your condition.  Will get help right away if you are not doing well or get worse. Document Released: 04/21/2009 Document Revised: 04/19/2011 Document Reviewed: 04/21/2009 [employer] Patient Information 2013 [place], [place].   Continue your medications for now. Try to stay well hydrated. Use the Lasix (furosemide) as needed for swelling in your legs. It is important that you quit smoking.  As per our discussion today, this is your last visit at our clinic. We will mail you a copy of your records.  I suggest you seek medical care at the new [facility] that is opening for uninsured patients. Call the hospital to get more information about this clinic.

## 2012-03-12 NOTE — Progress Notes (Signed)
Quick Note:  Please call pt and advise that the following labs are abnormal... Vitamin D level is still low; pt was advised to continue using OTC Vitamin D3 since she could not tolerate the Vit D 50000 supplement. She needs take OTC Vitamin D3 2000 IU 1 capsule twice a day; it will take months to get her level up to normal. Vitamin B12 is in the normal range.   Copy to pt. ______

## 2012-03-17 ENCOUNTER — Telehealth: Payer: Self-pay

## 2012-03-17 NOTE — Telephone Encounter (Signed)
Message copied by Celine Mans on Fri Mar 17, 2012  3:25 PM ------      Message from: Coletta Memos      Created: Wed Mar 15, 2012  8:35 AM      Regarding: patient records        Patient was dismissed from practice 03/10/2012.  She requests complete notes per her request for disability.  Please call patient when prepared and notify patient of any fees for medical records.  Thank you.

## 2012-03-17 NOTE — Telephone Encounter (Signed)
Records printed for pickup. Spoke with Newton Pigg (who is on [place]) and notified him that they were ready for pick up since he stated patient was sleeping. Asked if records can be mailed instead and I verified home address. Records mailed.

## 2012-04-12 ENCOUNTER — Encounter (HOSPITAL_COMMUNITY): Payer: Self-pay

## 2012-04-12 ENCOUNTER — Emergency Department (INDEPENDENT_AMBULATORY_CARE_PROVIDER_SITE_OTHER)
Admission: EM | Admit: 2012-04-12 | Discharge: 2012-04-12 | Disposition: A | Discharge status: Home / Self Care | Payer: Self-pay | Source: Home / Self Care

## 2012-04-12 DIAGNOSIS — M792 Neuralgia and neuritis, unspecified: Secondary | ICD-10-CM | POA: Diagnosis present

## 2012-04-12 DIAGNOSIS — J3089 Other allergic rhinitis: Secondary | ICD-10-CM | POA: Diagnosis present

## 2012-04-12 DIAGNOSIS — E559 Vitamin D deficiency, unspecified: Secondary | ICD-10-CM | POA: Diagnosis present

## 2012-04-12 DIAGNOSIS — G8929 Other chronic pain: Secondary | ICD-10-CM

## 2012-04-12 DIAGNOSIS — F411 Generalized anxiety disorder: Secondary | ICD-10-CM

## 2012-04-12 DIAGNOSIS — J309 Allergic rhinitis, unspecified: Secondary | ICD-10-CM | POA: Diagnosis present

## 2012-04-12 DIAGNOSIS — Z720 Tobacco use: Secondary | ICD-10-CM | POA: Diagnosis present

## 2012-04-12 DIAGNOSIS — J45909 Unspecified asthma, uncomplicated: Secondary | ICD-10-CM | POA: Diagnosis present

## 2012-04-12 DIAGNOSIS — I341 Nonrheumatic mitral (valve) prolapse: Secondary | ICD-10-CM

## 2012-04-12 DIAGNOSIS — M62838 Other muscle spasm: Secondary | ICD-10-CM

## 2012-04-12 DIAGNOSIS — J4489 Other specified chronic obstructive pulmonary disease: Secondary | ICD-10-CM | POA: Diagnosis present

## 2012-04-12 DIAGNOSIS — M549 Dorsalgia, unspecified: Secondary | ICD-10-CM

## 2012-04-12 DIAGNOSIS — R600 Localized edema: Secondary | ICD-10-CM

## 2012-04-12 DIAGNOSIS — M79604 Pain in right leg: Secondary | ICD-10-CM

## 2012-04-12 DIAGNOSIS — F419 Anxiety disorder, unspecified: Secondary | ICD-10-CM

## 2012-04-12 LAB — BASIC METABOLIC PANEL
BUN: 15 mg/dL (ref 6–23)
GFR calc non Af Amer: >90 mL/min (ref >90)
Glucose, Bld: 93 mg/dL (ref 70–99)
Potassium: 3.5 mEq/L (ref 3.5–5.1)

## 2012-04-12 MED ORDER — ALPRAZOLAM 1 MG PO TABS: 1.0000 mg | ORAL_TABLET | Freq: Three times a day (TID) | ORAL | Status: DC

## 2012-04-12 MED ORDER — CARISOPRODOL 350 MG PO TABS: 350.0000 mg | ORAL_TABLET | Freq: Three times a day (TID) | ORAL | Status: DC | PRN

## 2012-04-12 MED ORDER — ALBUTEROL SULFATE HFA 108 (90 BASE) MCG/ACT IN AERS: 2.0000 | INHALATION_SPRAY | Freq: Four times a day (QID) | RESPIRATORY_TRACT | Status: DC | PRN

## 2012-04-12 MED ORDER — RANITIDINE HCL 75 MG PO TABS: 75.0000 mg | ORAL_TABLET | Freq: Every day | ORAL | Status: DC | PRN

## 2012-04-12 MED ORDER — FUROSEMIDE 20 MG PO TABS: ORAL_TABLET | ORAL | Status: DC

## 2012-04-12 MED ORDER — DOCUSATE SODIUM 100 MG PO CAPS: 100.0000 mg | ORAL_CAPSULE | Freq: Two times a day (BID) | ORAL | Status: DC

## 2012-04-12 MED ORDER — CYCLOBENZAPRINE HCL 10 MG PO TABS: 10.0000 mg | ORAL_TABLET | Freq: Every evening | ORAL | Status: DC | PRN

## 2012-04-12 MED ORDER — METOPROLOL SUCCINATE ER 50 MG PO TB24: 50.0000 mg | ORAL_TABLET | Freq: Every day | ORAL | Status: DC

## 2012-04-12 MED ORDER — HYDROCODONE-ACETAMINOPHEN 5-325 MG PO TABS: 1.0000 | ORAL_TABLET | Freq: Three times a day (TID) | ORAL | Status: DC

## 2012-04-12 NOTE — ED Notes (Signed)
Patient has a history of asthma Edema in legs Chronic neck and back problems

## 2012-04-12 NOTE — ED Provider Notes (Signed)
Patient Demographics  Sylvia Hall, is a 52 y.o. female  ZOX:[id number]  WJX:[id number]  DOB - December 14, 1960  Chief Complaint  Patient presents with  . Asthma  . Leg Swelling  . PAD        Subjective:   [name] today has, No headache, No chest pain, No abdominal pain - No Nausea, No new weakness tingling or numbness, No Cough - SOB. Chronic back pain, burning bilateral lower extremity pain.  Objective:    Filed Vitals:   04/12/12 1624  BP: 126/77  Pulse: 93  Temp: 97.9 F (36.6 C)  TempSrc: Oral  Resp: 18  SpO2: 100%     Exam  Awake Alert, Oriented X 3, No new F.N deficits, Normal affect [place].AT,PERRAL Supple Neck,No JVD, No cervical lymphadenopathy appriciated.  Symmetrical Chest wall movement, Good air movement bilaterally, CTAB RRR,No Gallops,Rubs or new Murmurs, No Parasternal Heave +ve B.Sounds, Abd Soft, Non tender, No organomegaly appriciated, No rebound - guarding or rigidity. No Cyanosis, Clubbing or edema, No new Rash or bruise , good pedal pulses bilaterally    Data Review   CBC No results found for this basename: WBC, HGB, HCT, PLT, MCV, MCH, MCHC, RDW, NEUTRABS, LYMPHSABS, MONOABS, EOSABS, BASOSABS, BANDABS, BANDSABD,  in the last 168 hours  Chemistries   No results found for this basename: NA, K, CL, CO2, GLUCOSE, BUN, CREATININE, GFRCGP, CALCIUM, MG, AST, ALT, ALKPHOS, BILITOT,  in the last 168 hours ------------------------------------------------------------------------------------------------------------------ No results found for this basename: HGBA1C,  in the last 72 hours ------------------------------------------------------------------------------------------------------------------ No results found for this basename: CHOL, HDL, LDLCALC, TRIG, CHOLHDL, LDLDIRECT,  in the last 72 hours ------------------------------------------------------------------------------------------------------------------ No results found for this basename:  TSH, T4TOTAL, FREET3, T3FREE, THYROIDAB,  in the last 72 hours ------------------------------------------------------------------------------------------------------------------ No results found for this basename: VITAMINB12, FOLATE, FERRITIN, TIBC, IRON, RETICCTPCT,  in the last 72 hours  Coagulation profile  No results found for this basename: INR, PROTIME,  in the last 168 hours     Prior to Admission medications   Medication Sig Start Date End Date Taking? Authorizing Provider  albuterol (PROVENTIL HFA;VENTOLIN HFA) 108 (90 BASE) MCG/ACT inhaler Inhale 2 puffs into the lungs every 6 (six) hours as needed. For shortness of breath 04/12/12 04/12/13  Leroy Sea, MD  ALPRAZolam [name] Feeler) 1 MG tablet Take 1 tablet (1 mg total) by mouth 3 (three) times daily. 04/12/12   Leroy Sea, MD  carisoprodol (SOMA) 350 MG tablet Take 1 tablet (350 mg total) by mouth 3 (three) times daily as needed for muscle spasms. 04/12/12   Leroy Sea, MD  cyclobenzaprine (FLEXERIL) 10 MG tablet Take 1 tablet (10 mg total) by mouth at bedtime as needed for muscle spasms. 04/12/12   Leroy Sea, MD  docusate sodium (COLACE) 100 MG capsule Take 1 capsule (100 mg total) by mouth 2 (two) times daily. 04/12/12   Leroy Sea, MD  furosemide (LASIX) 20 MG tablet Take 1/2 tablet daily prn swelling in legs. 04/12/12   Leroy Sea, MD  HYDROcodone-acetaminophen (NORCO/VICODIN) 5-325 MG per tablet Take 1 tablet by mouth 3 (three) times daily. 04/12/12   Leroy Sea, MD  metoprolol succinate (TOPROL-XL) 50 MG 24 hr tablet Take 1 tablet (50 mg total) by mouth daily. 04/12/12 04/12/13  Leroy Sea, MD  ranitidine (ZANTAC) 75 MG tablet Take 1 tablet (75 mg total) by mouth daily as needed. ACID REFLUX 04/12/12   Leroy Sea, MD     Assessment & Plan  Patient with history of restless leg syndrome, neuropathic lower activity pain, mitral valve prolapse, chronic back pain, anxiety, recurrent mastitis in the  past. Who is here on her first visit to get prescription refills as she has been dismissed from her previous physician's office for unruly behavior.   Patient besides her chronic back pain and neuropathic bilateral lower external the pain has no subjective complaints at this time. She has moved from [place] and requests that she be referred to a OB/GYN for recurrent mastitis, cardiologist for mitral valve prolapse, along with GI for followup colonoscopy enhancement maintenance.   Patient was given one-month refill of her chronic medications, she has been told that per clinic policy chronic pain  medications would not be refilled. Rectus manager developed to talk to the patient about this. She will be requested to check herself in a local pain clinic for chronic pain medications.  For her lowest with the neuropathic pain she refused taking Neurontin saying that she is allergic to it, she was recently checked with venous lower extremity duplex asked her previous PCP office and it was negative, she has good dorsal pedis pulses. I will check a baseline BMP along with A1c, ABIs, patient to come back in 2 weeks for a 1 hour and 1 H&P to look into her chronic issues and followup on these test results.   Follow-up Information   Follow up with Primary Care Provider. Schedule an appointment as soon as possible for a visit in 2 weeks. (come for 1 hr for annual H&P)       Follow up with Willa Rough, MD. Schedule an appointment as soon as possible for a visit in 1 week.   Contact information:   [address] [place] [address] [phone]       Follow up with HORVATH,MICHELLE A, MD. Schedule an appointment as soon as possible for a visit in 1 week.   Contact information:   [address] [place] [address] [phone]       Follow up with Stan Head, MD. Schedule an appointment as soon as possible for a visit in 1 week.   Contact information:   [address] [place] [address] [phone]        Leroy Sea M.D on 04/12/2012 at 4:52 PM  Leroy Sea, MD 04/12/12 [phone]

## 2012-05-25 ENCOUNTER — Encounter (HOSPITAL_COMMUNITY): Payer: Self-pay | Admitting: *Deleted

## 2012-05-25 ENCOUNTER — Emergency Department (INDEPENDENT_AMBULATORY_CARE_PROVIDER_SITE_OTHER)
Admission: EM | Admit: 2012-05-25 | Discharge: 2012-05-25 | Disposition: A | Discharge status: Home / Self Care | Payer: PRIVATE HEALTH INSURANCE | Source: Home / Self Care | Attending: Family Medicine | Admitting: Family Medicine

## 2012-05-25 ENCOUNTER — Emergency Department (INDEPENDENT_AMBULATORY_CARE_PROVIDER_SITE_OTHER): Payer: PRIVATE HEALTH INSURANCE

## 2012-05-25 DIAGNOSIS — J302 Other seasonal allergic rhinitis: Secondary | ICD-10-CM

## 2012-05-25 DIAGNOSIS — J45909 Unspecified asthma, uncomplicated: Secondary | ICD-10-CM

## 2012-05-25 DIAGNOSIS — J309 Allergic rhinitis, unspecified: Secondary | ICD-10-CM

## 2012-05-25 HISTORY — DX: Spinal stenosis, lumbosacral region: M48.07

## 2012-05-25 MED ORDER — METHYLPREDNISOLONE ACETATE 40 MG/ML IJ SUSP
80.0000 mg | Freq: Once | INTRAMUSCULAR | Status: AC
  Administered 2012-05-25: 80 mg via INTRAMUSCULAR

## 2012-05-25 MED ORDER — FLUTICASONE PROPIONATE 50 MCG/ACT NA SUSP: 1.0000 | Freq: Two times a day (BID) | NASAL | Status: DC

## 2012-05-25 MED ORDER — FEXOFENADINE HCL 180 MG PO TABS: 180.0000 mg | ORAL_TABLET | Freq: Every day | ORAL | Status: DC

## 2012-05-25 MED ORDER — CEFDINIR 300 MG PO CAPS: 300.0000 mg | ORAL_CAPSULE | Freq: Two times a day (BID) | ORAL | Status: DC

## 2012-05-25 MED ORDER — METHYLPREDNISOLONE ACETATE 40 MG/ML IJ SUSP
INTRAMUSCULAR | Status: AC
  Filled 2012-05-25: qty 10

## 2012-05-25 NOTE — ED Notes (Signed)
C/o sore throat, earache, cough,  facial pain,  and chest pain onset 8 days ago. C/o chills and sweating.  Had fever 3 days ago.  Taking Ibuprofen.  C/o mid chest pain and pain between her shoulder blades.

## 2012-05-25 NOTE — ED Provider Notes (Signed)
History     CSN: [id number]  Arrival date & time 05/25/12  1437   First MD Initiated Contact with Patient 05/25/12 1618      Chief Complaint  Patient presents with  . Sore Throat  . Facial Pain  . Chest Pain    (Consider location/radiation/quality/duration/timing/severity/associated sxs/prior treatment) Patient is a 52 y.o. female presenting with pharyngitis and chest pain. The history is provided by the patient.  Sore Throat This is a new problem. The current episode started more than 1 week ago (8 days). The problem has been gradually worsening. Associated symptoms include chest pain. Pertinent negatives include no abdominal pain, no headaches and no shortness of breath. Associated symptoms comments: Smoker,.  Chest Pain Associated symptoms: no abdominal pain, no headache and no shortness of breath     Past Medical History  Diagnosis Date  . Mild mitral valve prolapse   . Asthma   . Back pain, chronic   . Thrombophlebitis   . Mitral valve prolapse   . Epilepsy   . Stenosis of lumbosacral spine     numbness L hand and L leg    Past Surgical History  Procedure Laterality Date  . Back surgery    . Nose surgery      s/p trauma  . Appendectomy    . Cesarean section    . Ovary surgery    . Uterine fibroid surgery    . Breast lumpectomy    . Abdominal hysterectomy    . Breast lumpectomy      Right breast  . Biopsy breast      Right breast x 10  . Rhinoplasty      Family History  Problem Relation Age of Onset  . Diabetes Mother   . Mitral valve prolapse Mother   . Asthma Mother   . Mitral valve prolapse Sister   . Asthma Sister   . Mitral valve prolapse Son   . Diabetes Maternal Uncle   . Diabetes Maternal Grandmother   . Mitral valve prolapse Sister     History  Substance Use Topics  . Smoking status: Current Every Day Smoker -- 1.00 packs/day for 12 years    Types: Cigarettes  . Smokeless tobacco: Not on file  . Alcohol Use: No    OB History   Grav Para Term Preterm Abortions TAB SAB Ect Mult Living                  Review of Systems  Constitutional: Negative.   HENT: Positive for congestion, rhinorrhea and postnasal drip.   Respiratory: Negative for shortness of breath.   Cardiovascular: Positive for chest pain.  Gastrointestinal: Negative.  Negative for abdominal pain.  Neurological: Negative for headaches.    Allergies  Phenytoin sodium extended; Aspirin; Caffeine; Dilantin; Food; Ivp dye; Levofloxacin; Sodium nitrate; and Sulfa drugs cross reactors  Home Medications   Current Outpatient Rx  Name  Route  Sig  Dispense  Refill  . albuterol (PROVENTIL HFA;VENTOLIN HFA) 108 (90 BASE) MCG/ACT inhaler   Inhalation   Inhale 2 puffs into the lungs every 6 (six) hours as needed. For shortness of breath   1 Inhaler   3   . ALPRAZolam (XANAX) 1 MG tablet   Oral   Take 1 tablet (1 mg total) by mouth 3 (three) times daily.   90 tablet   0     Do not refill before 03/28/12.   . carisoprodol (SOMA) 350 MG tablet   Oral  Take 1 tablet (350 mg total) by mouth 3 (three) times daily as needed for muscle spasms.   90 tablet   0   . cyclobenzaprine (FLEXERIL) 10 MG tablet   Oral   Take 1 tablet (10 mg total) by mouth at bedtime as needed for muscle spasms.   30 tablet   3   . docusate sodium (COLACE) 100 MG capsule   Oral   Take 1 capsule (100 mg total) by mouth 2 (two) times daily.   10 capsule   0   . furosemide (LASIX) 20 MG tablet      Take 1/2 tablet daily prn swelling in legs.   10 tablet   0   . HYDROcodone-acetaminophen (NORCO/VICODIN) 5-325 MG per tablet   Oral   Take 1 tablet by mouth 3 (three) times daily.   90 tablet   0     Do not fill before 03/28/12.   . metoprolol succinate (TOPROL-XL) 50 MG 24 hr tablet   Oral   Take 1 tablet (50 mg total) by mouth daily.   30 tablet   2   . ranitidine (ZANTAC) 75 MG tablet   Oral   Take 1 tablet (75 mg total) by mouth daily as needed. ACID REFLUX    60 tablet   1   . cefdinir (OMNICEF) 300 MG capsule   Oral   Take 1 capsule (300 mg total) by mouth 2 (two) times daily.   20 capsule   0   . fexofenadine (ALLEGRA) 180 MG tablet   Oral   Take 1 tablet (180 mg total) by mouth daily.   30 tablet   1   . fluticasone (FLONASE) 50 MCG/ACT nasal spray   Nasal   Place 1 spray into the nose 2 (two) times daily.   1 g   2     BP 102/77  Pulse 78  Temp(Src) 97.7 F (36.5 C) (Oral)  Resp 20  SpO2 99%  Physical Exam  Nursing note and vitals reviewed. Constitutional: She is oriented to person, place, and time. She appears well-developed and well-nourished.  HENT:  Head: Normocephalic.  Right Ear: External ear normal.  Left Ear: External ear normal.  Mouth/Throat: Oropharynx is clear and moist.  Eyes: Pupils are equal, round, and reactive to light.  Neck: Normal range of motion. Neck supple.  Cardiovascular: Normal rate and intact distal pulses.   Pulmonary/Chest: Effort normal. She has no wheezes. She has rhonchi. She has no rales.  Neurological: She is alert and oriented to person, place, and time.  Skin: Skin is warm and dry.    ED Course  Procedures (including critical care time)  Labs Reviewed - No data to display Dg Chest 2 View  05/25/2012  *RADIOLOGY REPORT*  Clinical Data: Cough and shortness of breath.  CHEST - 2 VIEW  Comparison: PA and lateral chest 02/10/2012 and 04/14/2011.  Findings: Lungs are clear.  Heart size is normal.  No pneumothorax or pleural fluid.  No focal bony abnormality.  IMPRESSION: Negative chest.   Original Report Authenticated By: Holley Dexter, M.D.      1. Seasonal allergic reaction   2. Bronchitis, allergic       MDM  X-rays reviewed and report per radiologist.         Linna Hoff, MD 05/25/12 [phone]

## 2012-05-26 ENCOUNTER — Telehealth (HOSPITAL_COMMUNITY): Payer: Self-pay | Admitting: *Deleted

## 2012-05-26 ENCOUNTER — Emergency Department (INDEPENDENT_AMBULATORY_CARE_PROVIDER_SITE_OTHER)
Admission: EM | Admit: 2012-05-26 | Discharge: 2012-05-26 | Disposition: A | Discharge status: Home Health | Payer: PRIVATE HEALTH INSURANCE | Source: Home / Self Care

## 2012-05-26 ENCOUNTER — Encounter (HOSPITAL_COMMUNITY): Payer: Self-pay

## 2012-05-26 DIAGNOSIS — J45909 Unspecified asthma, uncomplicated: Secondary | ICD-10-CM

## 2012-05-26 DIAGNOSIS — I341 Nonrheumatic mitral (valve) prolapse: Secondary | ICD-10-CM

## 2012-05-26 DIAGNOSIS — M542 Cervicalgia: Secondary | ICD-10-CM

## 2012-05-26 DIAGNOSIS — R600 Localized edema: Secondary | ICD-10-CM

## 2012-05-26 DIAGNOSIS — I059 Rheumatic mitral valve disease, unspecified: Secondary | ICD-10-CM

## 2012-05-26 DIAGNOSIS — G8929 Other chronic pain: Secondary | ICD-10-CM

## 2012-05-26 DIAGNOSIS — M549 Dorsalgia, unspecified: Secondary | ICD-10-CM

## 2012-05-26 DIAGNOSIS — F419 Anxiety disorder, unspecified: Secondary | ICD-10-CM

## 2012-05-26 DIAGNOSIS — F411 Generalized anxiety disorder: Secondary | ICD-10-CM

## 2012-05-26 MED ORDER — METOPROLOL SUCCINATE ER 50 MG PO TB24: 50.0000 mg | ORAL_TABLET | Freq: Every day | ORAL | Status: DC

## 2012-05-26 MED ORDER — RANITIDINE HCL 75 MG PO TABS: 75.0000 mg | ORAL_TABLET | Freq: Every day | ORAL | Status: DC | PRN

## 2012-05-26 MED ORDER — FUROSEMIDE 20 MG PO TABS: ORAL_TABLET | ORAL | Status: DC

## 2012-05-26 MED ORDER — HYDROCODONE-ACETAMINOPHEN 5-325 MG PO TABS: 1.0000 | ORAL_TABLET | Freq: Three times a day (TID) | ORAL | Status: DC

## 2012-05-26 MED ORDER — ALPRAZOLAM 1 MG PO TABS: 1.0000 mg | ORAL_TABLET | Freq: Three times a day (TID) | ORAL | Status: DC

## 2012-05-26 NOTE — Progress Notes (Signed)
Patient Demographics  Sylvia Hall, is a 52 y.o. female  ZOX:[id number]  [id number]:[id number]  DOB - 03/26/1960  Chief Complaint  Patient presents with  . Medication Refill        Subjective:   Sylvia Hall today is here for a follow up vist. Patient has No headache, No chest pain, No abdominal pain - No Nausea, No new weakness tingling or numbness, No Cough - SOB.  Patient has chronic back pain from spinal stenosis. She was seen yesterday in the urgent care clinic for acute bronchitis/acute allergic reaction. Today she is doing much better, lungs are clear. She was seen by Dr. Thedore Hall during her last visit when she was explained that she would not get further refills of her pain medications here and he had apparently referred her to a pain clinic. Patient claims today that she has had no further appointments scheduled and is wanting refills on pain medications. She also wants to see a cardiologist for a mitral valve prolapse.  Objective:    Filed Vitals:   05/26/12 1506  BP: 111/73  Pulse: 76  Temp: 98.1 F (36.7 C)  TempSrc: Oral  Resp: 18  SpO2: 98%     ALLERGIES:   Allergies  Allergen Reactions  . Phenytoin Sodium Extended Other (See Comments)    seizures  . Aspirin Hives  . Caffeine Other (See Comments)    Heart races  . Dilantin (Phenytoin Sodium Extended)   . Food     Sodium nitrates  . Ivp Dye (Iodinated Diagnostic Agents) Hives  . Levofloxacin Hives  . Sodium Nitrate   . Sulfa Drugs Cross Reactors Hives    PAST MEDICAL HISTORY: Past Medical History  Diagnosis Date  . Mild mitral valve prolapse   . Asthma   . Back pain, chronic   . Thrombophlebitis   . Mitral valve prolapse   . Epilepsy   . Stenosis of lumbosacral spine     numbness L hand and L leg    MEDICATIONS AT HOME: Prior to Admission medications   Medication Sig Start Date End Date Taking? Authorizing Provider  albuterol (PROVENTIL HFA;VENTOLIN HFA) 108 (90 BASE) MCG/ACT inhaler Inhale 2  puffs into the lungs every 6 (six) hours as needed. For shortness of breath 04/12/12 04/12/13  Sylvia Sea, MD  ALPRAZolam [name] Feeler) 1 MG tablet Take 1 tablet (1 mg total) by mouth 3 (three) times daily. 05/26/12   Sylvia Hall Sylvia Dredge, MD  carisoprodol (SOMA) 350 MG tablet Take 1 tablet (350 mg total) by mouth 3 (three) times daily as needed for muscle spasms. 04/12/12   Sylvia Sea, MD  cefdinir (OMNICEF) 300 MG capsule Take 1 capsule (300 mg total) by mouth 2 (two) times daily. 05/25/12   Sylvia Hoff, MD  docusate sodium (COLACE) 100 MG capsule Take 1 capsule (100 mg total) by mouth 2 (two) times daily. 04/12/12   Sylvia Sea, MD  fexofenadine (ALLEGRA) 180 MG tablet Take 1 tablet (180 mg total) by mouth daily. 05/25/12   Sylvia Hoff, MD  fluticasone (FLONASE) 50 MCG/ACT nasal spray Place 1 spray into the nose 2 (two) times daily. 05/25/12   Sylvia Hoff, MD  furosemide (LASIX) 20 MG tablet Take 1/2 tablet daily prn swelling in legs. 05/26/12   Sylvia Hall Sylvia Dredge, MD  HYDROcodone-acetaminophen (NORCO/VICODIN) 5-325 MG per tablet Take 1 tablet by mouth 3 (three) times daily. 05/26/12   Sylvia Hall Sylvia Dredge, MD  metoprolol succinate (TOPROL-XL) 50 MG 24 hr tablet Take  1 tablet (50 mg total) by mouth daily. 05/26/12 05/26/13  Sylvia Hall Sylvia Dredge, MD  ranitidine (ZANTAC) 75 MG tablet Take 1 tablet (75 mg total) by mouth daily as needed. ACID REFLUX 05/26/12   Sylvia Bees, MD     Exam  General appearance :Awake, alert, not in any distress. Speech Clear. Not toxic Looking HEENT: Atraumatic and Normocephalic, pupils equally reactive to light and accomodation Neck: supple, no JVD. No cervical lymphadenopathy.  Chest:Good air entry bilaterally, no added sounds  CVS: S1 S2 regular, no murmurs.  Abdomen: Bowel sounds present, Non tender and not distended with no gaurding, rigidity or rebound. Extremities: B/L Lower Ext shows no edema, both legs are warm to touch Neurology: Awake alert, and oriented X 3,  CN II-XII intact, Non focal Skin:No Rash Wounds:N/A    Data Review   CBC No results found for this basename: WBC, HGB, HCT, PLT, MCV, MCH, MCHC, RDW, NEUTRABS, LYMPHSABS, MONOABS, EOSABS, BASOSABS, BANDABS, BANDSABD,  in the last 168 hours  Chemistries   No results found for this basename: NA, K, CL, CO2, GLUCOSE, BUN, CREATININE, GFRCGP, CALCIUM, MG, AST, ALT, ALKPHOS, BILITOT,  in the last 168 hours ------------------------------------------------------------------------------------------------------------------ No results found for this basename: HGBA1C,  in the last 72 hours ------------------------------------------------------------------------------------------------------------------ No results found for this basename: CHOL, HDL, LDLCALC, TRIG, CHOLHDL, LDLDIRECT,  in the last 72 hours ------------------------------------------------------------------------------------------------------------------ No results found for this basename: TSH, T4TOTAL, FREET3, T3FREE, THYROIDAB,  in the last 72 hours ------------------------------------------------------------------------------------------------------------------ No results found for this basename: VITAMINB12, FOLATE, FERRITIN, TIBC, IRON, RETICCTPCT,  in the last 72 hours  Coagulation profile  No results found for this basename: INR, PROTIME,  in the last 168 hours    Assessment & Plan   Chronic pain syndrome from chronic back pain - Refill Percocet- 1 month supply. - I have reemphasized that she needs to go and get a self establish with a pain management clinic. - I've asked the Sylvia Hall to see if we can refer her. - Since she has not had a referral yet-I will fill her Percocet-one month only. I have further re- emphasized that we would not be further filling her chronic narcotics in this clinic.  Anxiety - Stable -c/w Xanaz-one month supply re-filled  Seasonal allergies - Continue current therapy-seen in the urgent care  clinic yesterday-better today  Mitral valve prolapse - Will refer to cardiology-Sylvia Hall will do the appropriate paperwork  Hypertension - Controlled   Follow-up Information   Follow up with Sylvia Hall. Schedule an appointment as soon as possible for a visit in 1 month.

## 2012-05-26 NOTE — ED Notes (Signed)
Referral faxed to [facility]- for colonoscopy Waiting for an appt

## 2012-05-26 NOTE — ED Notes (Signed)
Patient here for medication refill.

## 2012-05-26 NOTE — ED Notes (Signed)
[facility]ed to [facility] for chronic [facility] pain

## 2012-05-26 NOTE — ED Notes (Signed)
[facility] [address] pharmacist Christy called on VM and said her Rx.'s say not to refill before 06/25/12 and was not sure if they were to fill them now. She said to call back [phone].  I called back but pharmacy had closed. I left a message that pt. was seen at [facility] on 4/17 and our doctor would not refill her Rx.  I saw her come back today to the Adult Care clinic at our facility, as instructed to do. She came to get the refill on her medications. So I'm sure they are to be filled now but not to be refilled before 06/25/12.  If any further questions call back to the Adult Care clinic on Monday @ [phone]. Vassie Moselle 05/26/2012

## 2012-05-26 NOTE — ED Notes (Signed)
Patient has an appt 06/26/12 with dr Fayrene Fearing [facility] Pt is aware of her appt.

## 2012-06-07 NOTE — ED Notes (Signed)
Patient unable to be seen at [facility] at this time

## 2012-06-26 ENCOUNTER — Ambulatory Visit (INDEPENDENT_AMBULATORY_CARE_PROVIDER_SITE_OTHER): Payer: PRIVATE HEALTH INSURANCE | Admitting: Cardiology

## 2012-06-26 ENCOUNTER — Encounter (HOSPITAL_COMMUNITY): Payer: Self-pay | Admitting: Emergency Medicine

## 2012-06-26 ENCOUNTER — Emergency Department (INDEPENDENT_AMBULATORY_CARE_PROVIDER_SITE_OTHER)
Admission: EM | Admit: 2012-06-26 | Discharge: 2012-06-26 | Disposition: A | Discharge status: Home / Self Care | Payer: PRIVATE HEALTH INSURANCE | Source: Home / Self Care | Attending: Emergency Medicine | Admitting: Emergency Medicine

## 2012-06-26 ENCOUNTER — Encounter: Payer: Self-pay | Admitting: Cardiology

## 2012-06-26 VITALS — BP 120/83 | HR 83 | Ht 63.0 in | Wt 178.1 lb

## 2012-06-26 DIAGNOSIS — F419 Anxiety disorder, unspecified: Secondary | ICD-10-CM

## 2012-06-26 DIAGNOSIS — G8929 Other chronic pain: Secondary | ICD-10-CM

## 2012-06-26 DIAGNOSIS — I341 Nonrheumatic mitral (valve) prolapse: Secondary | ICD-10-CM

## 2012-06-26 DIAGNOSIS — M549 Dorsalgia, unspecified: Secondary | ICD-10-CM

## 2012-06-26 DIAGNOSIS — Z7689 Persons encountering health services in other specified circumstances: Secondary | ICD-10-CM

## 2012-06-26 DIAGNOSIS — F1320 Sedative, hypnotic or anxiolytic dependence, uncomplicated: Secondary | ICD-10-CM

## 2012-06-26 DIAGNOSIS — I059 Rheumatic mitral valve disease, unspecified: Secondary | ICD-10-CM

## 2012-06-26 DIAGNOSIS — Z7189 Other specified counseling: Secondary | ICD-10-CM

## 2012-06-26 DIAGNOSIS — F411 Generalized anxiety disorder: Secondary | ICD-10-CM

## 2012-06-26 MED ORDER — ALPRAZOLAM 1 MG PO TABS: 1.0000 mg | ORAL_TABLET | Freq: Every day | ORAL | Status: DC

## 2012-06-26 MED ORDER — METOPROLOL SUCCINATE ER 50 MG PO TB24: 50.0000 mg | ORAL_TABLET | Freq: Every day | ORAL | Status: DC

## 2012-06-26 MED ORDER — ALPRAZOLAM 1 MG PO TABS: 1.0000 mg | ORAL_TABLET | Freq: Every evening | ORAL | Status: DC | PRN

## 2012-06-26 NOTE — ED Notes (Signed)
Pt c/o needing refill on medications. Will f/u with Adult [facility] Clinic but is out of Anxiety meds. Feels like she is going through withdrawal. Mouth is dry ad she is very shaky. Patient is alert and oriented.

## 2012-06-26 NOTE — ED Provider Notes (Signed)
AND 0I and is History     CSN: [id number]  Arrival date & time 06/26/12  1732   First MD Initiated Contact with Patient 06/26/12 1802      Chief Complaint  Patient presents with  . Medication Refill    (Consider location/radiation/quality/duration/timing/severity/associated sxs/prior treatment) HPI Comments: Patient presents this evening to urgent care describing that she was sent here from her cardiologist office to get refills on her pain medicines and anxiety medicines. Patient has been seen at the adult Center, where she had had refills on benzodiazepines and narcotics. She has ran out of her a ProSol M. as described and she's feeling she is starting to withdraw and she's been taking this medicines for many years when she resided in [place]. Patient had been seen in the past at [facility] [place] urgent care, multiple incidences in the emergency department with chronic back pain and anxiety and requesting narcotic medicines.     The history is provided by the patient.    Past Medical History  Diagnosis Date  . Asthma   . Back pain, chronic   . Thrombophlebitis   . Mitral valve prolapse   . Epilepsy   . Stenosis of lumbosacral spine     numbness L hand and L leg  . Pericarditis     Past Surgical History  Procedure Laterality Date  . Nose surgery      s/p trauma  . Appendectomy    . Cesarean section      x 2  . Ovary surgery    . Uterine fibroid surgery    . Breast lumpectomy    . Abdominal hysterectomy    . Breast lumpectomy      Right breast  . Biopsy breast      Right breast x 10  . Rhinoplasty      Family History  Problem Relation Age of Onset  . Diabetes Mother   . Mitral valve prolapse Mother   . Asthma Mother   . Mitral valve prolapse Sister   . Asthma Sister   . Mitral valve prolapse Son   . Diabetes Maternal Uncle   . Diabetes Maternal Grandmother   . Mitral valve prolapse Sister   . CAD Mother 51  . CAD Son 23    No details available     History  Substance Use Topics  . Smoking status: Current Every Day Smoker -- 1.00 packs/day for 12 years    Types: Cigarettes  . Smokeless tobacco: Not on file  . Alcohol Use: No    OB History   Grav Para Term Preterm Abortions TAB SAB Ect Mult Living                  Review of Systems  Musculoskeletal: Positive for back pain.    Allergies  Phenytoin sodium extended; Aspirin; Caffeine; Dilantin; Food; Ivp dye; Levofloxacin; Sodium nitrate; and Sulfa drugs cross reactors  Home Medications   Current Outpatient Rx  Name  Route  Sig  Dispense  Refill  . HYDROcodone-acetaminophen (NORCO/VICODIN) 5-325 MG per tablet   Oral   Take 1 tablet by mouth 3 (three) times daily.   90 tablet   0     Do not fill before 06/25/12.   Marland Kitchen albuterol (PROVENTIL HFA;VENTOLIN HFA) 108 (90 BASE) MCG/ACT inhaler   Inhalation   Inhale 2 puffs into the lungs every 6 (six) hours as needed. For shortness of breath   1 Inhaler   3   .  ALPRAZolam (XANAX) 1 MG tablet   Oral   Take 1 tablet (1 mg total) by mouth daily.   5 tablet   0     Do not refill before 06/25/12.   Marland Kitchen ALPRAZolam (XANAX) 1 MG tablet   Oral   Take 1 tablet (1 mg total) by mouth at bedtime as needed for sleep.   5 tablet   0   . carisoprodol (SOMA) 350 MG tablet   Oral   Take 1 tablet (350 mg total) by mouth 3 (three) times daily as needed for muscle spasms.   90 tablet   0   . cefdinir (OMNICEF) 300 MG capsule   Oral   Take 1 capsule (300 mg total) by mouth 2 (two) times daily.   20 capsule   0   . docusate sodium (COLACE) 100 MG capsule   Oral   Take 1 capsule (100 mg total) by mouth 2 (two) times daily.   10 capsule   0   . fexofenadine (ALLEGRA) 180 MG tablet   Oral   Take 1 tablet (180 mg total) by mouth daily.   30 tablet   1   . fluticasone (FLONASE) 50 MCG/ACT nasal spray   Nasal   Place 1 spray into the nose 2 (two) times daily.   1 g   2   . furosemide (LASIX) 20 MG tablet      Take 1/2  tablet daily prn swelling in legs.   30 tablet   0   . metoprolol succinate (TOPROL-XL) 50 MG 24 hr tablet   Oral   Take 1 tablet (50 mg total) by mouth daily.   30 tablet   11   . ranitidine (ZANTAC) 75 MG tablet   Oral   Take 1 tablet (75 mg total) by mouth daily as needed. ACID REFLUX   60 tablet   1     BP 117/80  Pulse 93  Temp(Src) 97.2 F (36.2 C) (Oral)  Resp 16  SpO2 97%  Physical Exam  Nursing note and vitals reviewed. Constitutional: She is oriented to person, place, and time. No distress.  Neurological: She is alert and oriented to person, place, and time.  Psychiatric: Her mood appears anxious.    ED Course  Procedures (including critical care time)  Labs Reviewed - No data to display No results found.   1. Chronic back pain   2. Benzodiazepine dependence   3. Anxiety       MDM  Patient presents to urgent care resting pain medicine and anxiolytics. Patient had been seen at the adult Center in processes the orange card has been instructed to followup with [facility] tomorrow.  Rx 5 tablets of Xanax 1MG        Jimmie Molly, MD 06/26/12 1910

## 2012-06-26 NOTE — Progress Notes (Signed)
HPI The patient presents for evaluation of palpitations and mitral valve prolapse.  She has had this history for years though she reports not having had an echocardiogram in many years. She used to live in [place] but has been down here for about 2 years. She wanted to followup with a cardiologist because of this. Also she says that she was told she could get her Xanax for this clinic. She says she needs this because she feels her heart racing at night she takes a [facility] at [facility]. She takes a beta blocker in the morning. She says it happens frequently that she has her palpitations sometimes a couple of times per day. He is a very chronic problem. She's not describing frank syncope. She's not having any new shortness of breath, PND or orthopnea. She has diffuse body aches and is limited by back pain. However, she's not describing chronic substernal chest pain consistent with angina.   Allergies  Allergen Reactions  . Phenytoin Sodium Extended Other (See Comments)    seizures  . Aspirin Hives  . Caffeine Other (See Comments)    Heart races  . Dilantin (Phenytoin Sodium Extended)   . Food     Sodium nitrates  . Ivp Dye (Iodinated Diagnostic Agents) Hives  . Levofloxacin Hives  . Sodium Nitrate   . Sulfa Drugs Cross Reactors Hives    Current Outpatient Prescriptions  Medication Sig Dispense Refill  . albuterol (PROVENTIL HFA;VENTOLIN HFA) 108 (90 BASE) MCG/ACT inhaler Inhale 2 puffs into the lungs every 6 (six) hours as needed. For shortness of breath  1 Inhaler  3  . ALPRAZolam (XANAX) 1 MG tablet Take 1 tablet (1 mg total) by mouth 3 (three) times daily.  90 tablet  0  . carisoprodol (SOMA) 350 MG tablet Take 1 tablet (350 mg total) by mouth 3 (three) times daily as needed for muscle spasms.  90 tablet  0  . cefdinir (OMNICEF) 300 MG capsule Take 1 capsule (300 mg total) by mouth 2 (two) times daily.  20 capsule  0  . docusate sodium (COLACE) 100 MG capsule Take 1 capsule (100 mg  total) by mouth 2 (two) times daily.  10 capsule  0  . fexofenadine (ALLEGRA) 180 MG tablet Take 1 tablet (180 mg total) by mouth daily.  30 tablet  1  . fluticasone (FLONASE) 50 MCG/ACT nasal spray Place 1 spray into the nose 2 (two) times daily.  1 g  2  . furosemide (LASIX) 20 MG tablet Take 1/2 tablet daily prn swelling in legs.  30 tablet  0  . HYDROcodone-acetaminophen (NORCO/VICODIN) 5-325 MG per tablet Take 1 tablet by mouth 3 (three) times daily.  90 tablet  0  . metoprolol succinate (TOPROL-XL) 50 MG 24 hr tablet Take 1 tablet (50 mg total) by mouth daily.  30 tablet  11  . ranitidine (ZANTAC) 75 MG tablet Take 1 tablet (75 mg total) by mouth daily as needed. ACID REFLUX  60 tablet  1   No current facility-administered medications for this visit.    Past Medical History  Diagnosis Date  . Asthma   . Back pain, chronic   . Thrombophlebitis   . Mitral valve prolapse   . Epilepsy   . Stenosis of lumbosacral spine     numbness L hand and L leg  . Pericarditis     Past Surgical History  Procedure Laterality Date  . Nose surgery      s/p trauma  .  Appendectomy    . Cesarean section      x 2  . Ovary surgery    . Uterine fibroid surgery    . Breast lumpectomy    . Abdominal hysterectomy    . Breast lumpectomy      Right breast  . Biopsy breast      Right breast x 10  . Rhinoplasty      Family History  Problem Relation Age of Onset  . Diabetes Mother   . Mitral valve prolapse Mother   . Asthma Mother   . Mitral valve prolapse Sister   . Asthma Sister   . Mitral valve prolapse Son   . Diabetes Maternal Uncle   . Diabetes Maternal Grandmother   . Mitral valve prolapse Sister   . CAD Mother 51  . CAD Son 79    No details available    History   Social History  . Marital Status: Divorced    Spouse Name: N/A    Number of Children: 2  . Years of Education: N/A   Occupational History  . Not on file.   Social History Main Topics  . Smoking status: Current  Every Day Smoker -- 1.00 packs/day for 12 years    Types: Cigarettes  . Smokeless tobacco: Not on file  . Alcohol Use: No  . Drug Use: No  . Sexually Active: Yes    Birth Control/ Protection: None   Other Topics Concern  . Not on file   Social History Narrative   Lives alone.      ROS:  Positive for dizziness, constipation, reflux, left-sided body numbness, back and leg pain, leg swelling, asthma. Otherwise as stated in the HPI and negative for all other systems.  PHYSICAL EXAM BP 120/83  Pulse 83  Ht 5\' 3"  (1.6 m)  Wt 178 lb 1.9 oz (80.795 kg)  BMI 31.56 kg/m2 GENERAL:  Well appearing HEENT:  Pupils equal round and reactive, fundi not visualized, oral mucosa unremarkable NECK:  No jugular venous distention, waveform within normal limits, carotid upstroke brisk and symmetric, no bruits, no thyromegaly LYMPHATICS:  No cervical, inguinal adenopathy LUNGS:  Clear to auscultation bilaterally BACK:  No CVA tenderness CHEST:  Unremarkable HEART:  PMI not displaced or sustained,S1 and S2 within normal limits, no S3, no S4, no clicks, no rubs, no murmurs ABD:  Flat, positive bowel sounds normal in frequency in pitch, no bruits, no rebound, no guarding, no midline pulsatile mass, no hepatomegaly, no splenomegaly EXT:  2 plus pulses throughout, no edema, no cyanosis no clubbing SKIN:  No rashes no nodules NEURO:  Cranial nerves II through XII grossly intact, motor grossly intact throughout PSYCH:  Cognitively intact, oriented to person place and time   EKG:  Sinus rhythm, rate 83, axis within normal limits, intervals within normal limits, no acute ST-T wave changes.  06/26/2012   ASSESSMENT AND PLAN  MVP: I do not appreciate a click or murmur. I will check a echocardiogram to further evaluate the possibility of this. I will renew her beta blocker until she can get a primary care provider. However, I told her this clinic could not prescribe her Xanax. We did give her telephone number  and I confirmed that previous physicians had given her the information for the [facility]. I did review multiple urgent care notes and notes from her previous primary providers.

## 2012-06-26 NOTE — Patient Instructions (Addendum)
The current medical regimen is effective;  continue present plan and medications.  Your physician has requested that you have an echocardiogram. Echocardiography is a painless test that uses sound waves to create images of your heart. It provides your doctor with information about the size and shape of your heart and how well your heart's chambers and valves are working. This procedure takes approximately one hour. There are no restrictions for this procedure.  You have been referred to the [facility] to establish primary care.  Follow up with Dr Antoine Poche as needed.

## 2012-06-27 ENCOUNTER — Ambulatory Visit: Payer: PRIVATE HEALTH INSURANCE | Attending: Internal Medicine | Admitting: Family Medicine

## 2012-06-27 ENCOUNTER — Encounter: Payer: Self-pay | Admitting: Family Medicine

## 2012-06-27 VITALS — BP 105/72 | HR 83 | Temp 98.7°F | Resp 20 | Ht 63.0 in | Wt 176.5 lb

## 2012-06-27 DIAGNOSIS — R002 Palpitations: Secondary | ICD-10-CM | POA: Insufficient documentation

## 2012-06-27 DIAGNOSIS — N63 Unspecified lump in unspecified breast: Secondary | ICD-10-CM

## 2012-06-27 DIAGNOSIS — R252 Cramp and spasm: Secondary | ICD-10-CM

## 2012-06-27 DIAGNOSIS — M549 Dorsalgia, unspecified: Secondary | ICD-10-CM

## 2012-06-27 DIAGNOSIS — F419 Anxiety disorder, unspecified: Secondary | ICD-10-CM

## 2012-06-27 DIAGNOSIS — G8929 Other chronic pain: Secondary | ICD-10-CM

## 2012-06-27 DIAGNOSIS — F411 Generalized anxiety disorder: Secondary | ICD-10-CM

## 2012-06-27 DIAGNOSIS — I341 Nonrheumatic mitral (valve) prolapse: Secondary | ICD-10-CM

## 2012-06-27 DIAGNOSIS — I059 Rheumatic mitral valve disease, unspecified: Secondary | ICD-10-CM

## 2012-06-27 MED ORDER — ALPRAZOLAM 1 MG PO TABS: 1.0000 mg | ORAL_TABLET | Freq: Two times a day (BID) | ORAL | Status: DC | PRN

## 2012-06-27 NOTE — Progress Notes (Signed)
Routine check up and medication refills

## 2012-06-27 NOTE — Progress Notes (Signed)
  Subjective:    Patient ID: Sylvia Hall, female    DOB: May 26, 1960, 52 y.o.   MRN: [id number]  HPI Chronic pain - pt has been on narcotics for a long time. She has been told by [place] that we are not able to provide chronic pain management and that we will not refill her narcotics. We have referred her to a pain management clinic, however she has been denied being seen there. Unfortunately, as she does not have insurance, we do not have other PM options for her unless she would like to be cash pay.   Palpitations/mvp - Pt saw cardiologist yesterday and is scheduled for testing next month. She says initially when she was 52 years old she was started on xanax for her palpitations as well as anxiety. She believes they help both problems.   Anxiety - Pt takes 2mg  xanax daily. Has never tried anything else for treatment of her anxiety. Is out of her medicine and was seen at [facility] yesterday. There she was given 5 tabs and told to f/u here today.   Leg cramps - Has in the past and took requip but does not want to take that anymore. Had a leg cramp last night and is concerned they may be returning.       Review of Systems  Respiratory: Negative for shortness of breath.   Cardiovascular: Negative for chest pain and palpitations.       Objective:   Physical Exam  Constitutional: She appears well-developed and well-nourished.  Cardiovascular: Normal rate, regular rhythm and normal heart sounds.   Pulmonary/Chest: Effort normal and breath sounds normal.          Assessment & Plan:   Chronic  Pain - NO NARCOTICS REFILL HERE, have given her information on the [facility] for detox  Palpitations/mvp - cont to f/u as directed with cardiology. I have reviewed cardiologists note and do not see where they feel benzos are indicated for this patient regarding her history of palpitations, however I will confirm with them   Anxiety - discussed with pt that there are many other options now for treating  anxiety. Will have her see psychiatry to evaluate her level of anxiety, true need for benzos, and see if they agree that we may be able to address her anxiety with another class of agents. Will not refill benzos until she has seen psych.   Leg cramps - hydrate, checking bmp  Pt says in the past she has had breast lumps and is due for mammo as well as pap. Since we are not yet set up to do womens health, I have referred her to gyn.   F/u 1 for physical exam and anuual wellness check

## 2012-06-27 NOTE — Patient Instructions (Addendum)
Leg Cramps Leg cramps that occur during exercise can be caused by poor circulation or dehydration. However, muscle cramps that occur at rest or during the night are usually not due to any serious medical problem. Heat cramps may cause muscle spasms during hot weather.  CAUSES There is no clear cause for muscle cramps. However, dehydration may be a factor for those who do not drink enough fluids and those who exercise in the heat. Imbalances in the level of sodium, potassium, calcium or magnesium in the muscle tissue may also be a factor. Some medications, such as water pills (diuretics), may cause loss of chemicals that the body needs (like sodium and potassium) and cause muscle cramps. TREATMENT   Make sure your diet has enough fluids and essential minerals for the muscle to work normally.  Avoid strenuous exercise for several days if you have been having frequent leg cramps.  Stretch and massage the cramped muscle for several minutes.  Some medicines may be helpful in some patients with night cramps. Only take over-the-counter or prescription medicines as directed by your caregiver. SEEK IMMEDIATE MEDICAL CARE IF:   Your leg cramps become worse.  Your foot becomes cold, numb, or blue. Document Released: 03/04/2004 Document Revised: 04/19/2011 Document Reviewed: 02/20/2008 Exit[employer] Patient Information 2013 [employer].  

## 2012-06-28 ENCOUNTER — Encounter: Payer: Self-pay | Admitting: Family Medicine

## 2012-06-28 LAB — BASIC METABOLIC PANEL
CO2: 20 mEq/L (ref 19–32)
Calcium: 9.2 mg/dL (ref 8.4–10.5)
Glucose, Bld: 88 mg/dL (ref 70–99)
Potassium: 4.3 mEq/L (ref 3.5–5.3)
Sodium: 142 mEq/L (ref 135–145)

## 2012-07-06 ENCOUNTER — Telehealth (HOSPITAL_COMMUNITY): Payer: Self-pay

## 2012-07-06 NOTE — Telephone Encounter (Signed)
07/04/12 1:20pm Recd NP referral from [facility] - left msg for pt to call and set-up appt/sh

## 2012-07-10 ENCOUNTER — Ambulatory Visit (HOSPITAL_COMMUNITY): Payer: PRIVATE HEALTH INSURANCE | Attending: Cardiology | Admitting: Radiology

## 2012-07-10 DIAGNOSIS — I059 Rheumatic mitral valve disease, unspecified: Secondary | ICD-10-CM

## 2012-07-10 DIAGNOSIS — I341 Nonrheumatic mitral (valve) prolapse: Secondary | ICD-10-CM

## 2012-07-10 NOTE — Progress Notes (Signed)
Echocardiogram performed.  

## 2012-07-23 ENCOUNTER — Other Ambulatory Visit: Payer: Self-pay | Admitting: Internal Medicine

## 2012-07-24 ENCOUNTER — Ambulatory Visit: Payer: PRIVATE HEALTH INSURANCE | Attending: Family Medicine | Admitting: Internal Medicine

## 2012-07-24 VITALS — BP 119/87 | HR 111 | Temp 99.3°F | Resp 18 | Ht 62.75 in | Wt 179.0 lb

## 2012-07-24 DIAGNOSIS — I341 Nonrheumatic mitral (valve) prolapse: Secondary | ICD-10-CM

## 2012-07-24 DIAGNOSIS — M549 Dorsalgia, unspecified: Secondary | ICD-10-CM

## 2012-07-24 DIAGNOSIS — F1320 Sedative, hypnotic or anxiolytic dependence, uncomplicated: Secondary | ICD-10-CM | POA: Insufficient documentation

## 2012-07-24 DIAGNOSIS — F411 Generalized anxiety disorder: Secondary | ICD-10-CM

## 2012-07-24 DIAGNOSIS — I059 Rheumatic mitral valve disease, unspecified: Secondary | ICD-10-CM

## 2012-07-24 DIAGNOSIS — F419 Anxiety disorder, unspecified: Secondary | ICD-10-CM

## 2012-07-24 DIAGNOSIS — R000 Tachycardia, unspecified: Secondary | ICD-10-CM | POA: Insufficient documentation

## 2012-07-24 MED ORDER — CARISOPRODOL 350 MG PO TABS: 350.0000 mg | ORAL_TABLET | Freq: Two times a day (BID) | ORAL | Status: DC | PRN

## 2012-07-24 MED ORDER — ALPRAZOLAM 1 MG PO TABS: 1.0000 mg | ORAL_TABLET | Freq: Two times a day (BID) | ORAL | Status: DC | PRN

## 2012-07-24 MED ORDER — PREGABALIN 100 MG PO CAPS: 100.0000 mg | ORAL_CAPSULE | Freq: Every day | ORAL | Status: DC

## 2012-07-24 NOTE — Progress Notes (Signed)
Patient ID: Arika Mainer, female   DOB: February 17, 1960, 52 y.o.   MRN: [id number] Patient Demographics  Elenora Hawbaker, is a 52 y.o. female  WUJ:[id number]  [id number]  DOB - 07-15-1960  Chief Complaint  Patient presents with  . Medication Refill        Subjective:   [name] with history of asthma, chronic joint pains and aches, chronic neuropathy, anxiety, history of chronic benzo and narcotic use, comes in for a followup visit, she had recently seen a cardiologist for supposedly mitral valve prolapse which was not seen on the echo gram, she is here as she has run out of her anxiety  medications, on her last visit she has been referred to psychiatry and OBG/GYN, she feels little anxious and has not taken her benzodiazepine and she has run out causing her heart to beat fast according to the patient, she also has not taken her Toprol today.  Objective:   Past Medical History  Diagnosis Date  . Asthma   . Back pain, chronic   . Thrombophlebitis   . Mitral valve prolapse   . Epilepsy   . Stenosis of lumbosacral spine     numbness L hand and L leg  . Pericarditis       Past Surgical History  Procedure Laterality Date  . Nose surgery      s/p trauma  . Appendectomy    . Cesarean section      x 2  . Ovary surgery    . Uterine fibroid surgery    . Breast lumpectomy    . Abdominal hysterectomy    . Breast lumpectomy      Right breast  . Biopsy breast      Right breast x 10  . Rhinoplasty       Filed Vitals:   07/24/12 1021  BP: 119/87  Pulse: 111  Temp: 99.3 F (37.4 C)  TempSrc: Oral  Resp: 18  Height: 5' 2.75" (1.594 m)  Weight: 179 lb (81.194 kg)  SpO2: 97%     Exam  Awake Alert, Oriented X 3, No new F.N deficits, Normal affect [place].AT,PERRAL Supple Neck,No JVD, No cervical lymphadenopathy appriciated.  Symmetrical Chest wall movement, Good air movement bilaterally, CTAB Rapid RRR,No Gallops,Rubs or new Murmurs, No Parasternal Heave +ve B.Sounds, Abd  Soft, Non tender, No organomegaly appriciated, No rebound - guarding or rigidity. No Cyanosis, Clubbing or edema, No new Rash or bruise      Data Review   CBC No results found for this basename: WBC, HGB, HCT, PLT, MCV, MCH, MCHC, RDW, NEUTRABS, LYMPHSABS, MONOABS, EOSABS, BASOSABS, BANDABS, BANDSABD,  in the last 168 hours  Chemistries   No results found for this basename: NA, K, CL, CO2, GLUCOSE, BUN, CREATININE, GFRCGP, CALCIUM, MG, AST, ALT, ALKPHOS, BILITOT,  in the last 168 hours ------------------------------------------------------------------------------------------------------------------ No results found for this basename: HGBA1C,  in the last 72 hours ------------------------------------------------------------------------------------------------------------------ No results found for this basename: CHOL, HDL, LDLCALC, TRIG, CHOLHDL, LDLDIRECT,  in the last 72 hours ------------------------------------------------------------------------------------------------------------------ No results found for this basename: TSH, T4TOTAL, FREET3, T3FREE, THYROIDAB,  in the last 72 hours ------------------------------------------------------------------------------------------------------------------ No results found for this basename: VITAMINB12, FOLATE, FERRITIN, TIBC, IRON, RETICCTPCT,  in the last 72 hours  Coagulation profile  No results found for this basename: INR, PROTIME,  in the last 168 hours     Prior to Admission medications   Medication Sig Start Date End Date Taking? Authorizing Provider  albuterol (PROVENTIL HFA;VENTOLIN HFA) 108 (90 BASE)  MCG/ACT inhaler Inhale 2 puffs into the lungs every 6 (six) hours as needed. For shortness of breath 04/12/12 04/12/13 Yes Leroy Sea, MD  carisoprodol (SOMA) 350 MG tablet Take 1 tablet (350 mg total) by mouth 2 (two) times daily as needed for muscle spasms. 07/24/12  Yes Leroy Sea, MD  fexofenadine (ALLEGRA) 180 MG tablet  Take 1 tablet (180 mg total) by mouth daily. 05/25/12  Yes Linna Hoff, MD  furosemide (LASIX) 20 MG tablet Take 1/2 tablet daily prn swelling in legs. 05/26/12  Yes Shanker Levora Dredge, MD  metoprolol succinate (TOPROL-XL) 50 MG 24 hr tablet Take 1 tablet (50 mg total) by mouth daily. 06/26/12 06/26/13 Yes Rollene Rotunda, MD  ALPRAZolam [name] Feeler) 1 MG tablet Take 1 tablet (1 mg total) by mouth 2 (two) times daily as needed for sleep. 07/24/12   Leroy Sea, MD  docusate sodium (COLACE) 100 MG capsule Take 1 capsule (100 mg total) by mouth 2 (two) times daily. 04/12/12   Leroy Sea, MD  fluticasone (FLONASE) 50 MCG/ACT nasal spray Place 1 spray into the nose 2 (two) times daily. 05/25/12   Linna Hoff, MD  ranitidine (ZANTAC) 75 MG tablet Take 1 tablet (75 mg total) by mouth daily as needed. ACID REFLUX 05/26/12   Maretta Bees, MD     Assessment & Plan   Chronic anxiety. Benzodiazepine dependence. She currently out of her benzodiazepine, mildly anxious with some tachycardia due to benzo withdrawal and not taking her baseline Toprol - her heart rate is regular, I have represcribed her taper dose benzodiazepine as before for one month, note she has a psych appointment on 08/24/2012, I have told her clearly that no further benzo refills will be provided in this clinic. After her psych followup anxiety management will be deferred to psychiatry.   Chronic narcotic use and seeking behavior.- Narcotics have been stopped and this clinic per her last visit, will not prescribe any further, lyrica as been prescribed for her neuropathic pain.   Chronic muscle pains and aches. He should requesting soma refilled, soma has been refitted at low dose goal will be to gradually taper her off her soma.   For her history of breast lumps. She has been referred to OB/GYN last visit. Requested her to continue following with them.    Leroy Sea M.D on 07/24/2012 at 10:33 AM

## 2012-07-24 NOTE — Progress Notes (Signed)
Patient states she is here for refills on her Xanax, Tresa Garter, and wants to know if she can get more hydrocodone. States that the [facility] told her that they will not help her because she does not take street drugs. States they do not detox off of Hydrocodone.

## 2012-07-27 ENCOUNTER — Telehealth: Payer: Self-pay | Admitting: Family Medicine

## 2012-07-31 ENCOUNTER — Other Ambulatory Visit: Payer: Self-pay | Admitting: Obstetrics & Gynecology

## 2012-07-31 ENCOUNTER — Ambulatory Visit (INDEPENDENT_AMBULATORY_CARE_PROVIDER_SITE_OTHER): Payer: PRIVATE HEALTH INSURANCE | Admitting: Obstetrics & Gynecology

## 2012-07-31 ENCOUNTER — Encounter: Payer: Self-pay | Admitting: Obstetrics & Gynecology

## 2012-07-31 VITALS — BP 116/78 | HR 87 | Ht 62.25 in | Wt 181.5 lb

## 2012-07-31 DIAGNOSIS — N63 Unspecified lump in unspecified breast: Secondary | ICD-10-CM

## 2012-07-31 DIAGNOSIS — N6459 Other signs and symptoms in breast: Secondary | ICD-10-CM

## 2012-07-31 DIAGNOSIS — N6452 Nipple discharge: Secondary | ICD-10-CM

## 2012-07-31 DIAGNOSIS — R635 Abnormal weight gain: Secondary | ICD-10-CM

## 2012-07-31 DIAGNOSIS — Z01419 Encounter for gynecological examination (general) (routine) without abnormal findings: Secondary | ICD-10-CM | POA: Insufficient documentation

## 2012-07-31 DIAGNOSIS — Z139 Encounter for screening, unspecified: Secondary | ICD-10-CM

## 2012-07-31 MED ORDER — PREGABALIN 25 MG PO CAPS: 25.0000 mg | ORAL_CAPSULE | Freq: Every day | ORAL | Status: DC

## 2012-07-31 MED ORDER — PREGABALIN 25 MG PO CAPS: 100.0000 mg | ORAL_CAPSULE | Freq: Every day | ORAL | Status: DC
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
## 2012-07-31 NOTE — Telephone Encounter (Signed)
Changed Lyrica to 25 mg QD

## 2012-07-31 NOTE — Patient Instructions (Addendum)
Mammography  Mammography is an X-ray of the breasts to look for changes that are not normal. The X-ray image is called a mammogram. This procedure can screen for breast cancer, can detect cancer early, and can diagnose cancer.   LET YOUR CAREGIVER KNOW ABOUT:  · Breast implants.  · Previous breast disease, biopsy, or surgery.  · If you are breastfeeding.  · Medicines taken, including vitamins, herbs, eyedrops, over-the-counter medicines, and creams.  · Use of steroids (by mouth or creams).  · Possibility of pregnancy, if this applies.  RISKS AND COMPLICATIONS  · Exposure to radiation, but at very low levels.  · The results may be misinterpreted.  · The results may not be accurate.  · Mammography may lead to further tests.  · Mammography may not catch certain cancers.  BEFORE THE PROCEDURE  · Schedule your test about 7 days after your menstrual period. This is when your breasts are the least tender and have signs of hormone changes.  · If you have had a mammography done at a different facility in the past, get the mammogram X-rays or have them sent to your current exam facility in order to compare them.  · Wash your breasts and under your arms the day of the test.  · Do not wear deodorants, perfumes, or powders anywhere on your body.  · Wear clothes that you can change in and out of easily.  PROCEDURE  Relax as much as possible during the test. Any discomfort during the test will be very brief. The test should take less than 30 minutes. The following will happen:  · You will undress from the waist up and put on a gown.  · You will stand in front of the X-ray machine.  · Each breast will be placed between 2 plastic or glass plates. The plates will compress your breast for a few seconds.  · X-rays will be taken from different angles of the breast.  AFTER THE PROCEDURE  · The mammogram will be examined.  · Depending on the quality of the images, you may need to repeat certain parts of the test.  · Ask when your test  results will be ready. Make sure you get your test results.  · You may resume normal activities.  Document Released: 01/23/2000 Document Revised: 04/19/2011 Document Reviewed: 11/15/2010  [employer]® Patient Information ©2014 [employer].

## 2012-08-01 ENCOUNTER — Telehealth: Payer: Self-pay | Admitting: *Deleted

## 2012-08-01 LAB — TSH: TSH: 1.543 u[IU]/mL (ref 0.350–4.500)

## 2012-08-01 NOTE — Telephone Encounter (Signed)
08/01/12  Patient stated that Lyrica is costly please sent to pharnacy .  [facility] on [address] [phone] P[facility] B[facility]

## 2012-08-02 ENCOUNTER — Telehealth (HOSPITAL_COMMUNITY): Payer: Self-pay | Admitting: *Deleted

## 2012-08-02 NOTE — Telephone Encounter (Signed)
Telephoned patient at home # and left message to return call to [facility] 

## 2012-08-03 NOTE — Telephone Encounter (Signed)
08/03/12  Spoke with Wynona Canes At [facility] who stated that  The patient already have a prescription for  Lyrica 25mg  . According to patient she is allergic to neurontin . P[facility]

## 2012-08-04 ENCOUNTER — Telehealth: Payer: Self-pay | Admitting: Family Medicine

## 2012-08-04 NOTE — Telephone Encounter (Signed)
Pts pharmacy [phone]) was wondering if script can be changed to something that is cheaper, or something that can pt obtain at the department of public health. Dr. Izola Price thought that something at the Department of health was possible.

## 2012-08-07 NOTE — Telephone Encounter (Signed)
Because pt is allergic to gabapentin (neurontin), there is no generic version of lyrica, she should speak with the MAP program at the health department to see if they can obtain medication from the pharmaceutical company.   Maryln Manuel

## 2012-08-16 ENCOUNTER — Ambulatory Visit: Payer: PRIVATE HEALTH INSURANCE | Attending: Family Medicine | Admitting: Family Medicine

## 2012-08-16 ENCOUNTER — Encounter: Payer: Self-pay | Admitting: Family Medicine

## 2012-08-16 VITALS — BP 107/76 | HR 93 | Temp 98.8°F | Resp 16 | Ht 63.0 in | Wt 185.2 lb

## 2012-08-16 DIAGNOSIS — G629 Polyneuropathy, unspecified: Secondary | ICD-10-CM

## 2012-08-16 DIAGNOSIS — F419 Anxiety disorder, unspecified: Secondary | ICD-10-CM

## 2012-08-16 DIAGNOSIS — G589 Mononeuropathy, unspecified: Secondary | ICD-10-CM

## 2012-08-16 DIAGNOSIS — R002 Palpitations: Secondary | ICD-10-CM

## 2012-08-16 DIAGNOSIS — G2581 Restless legs syndrome: Secondary | ICD-10-CM

## 2012-08-16 DIAGNOSIS — F411 Generalized anxiety disorder: Secondary | ICD-10-CM

## 2012-08-16 MED ORDER — ALPRAZOLAM 1 MG PO TABS: 1.0000 mg | ORAL_TABLET | Freq: Three times a day (TID) | ORAL | Status: DC | PRN

## 2012-08-16 MED ORDER — PREGABALIN 25 MG PO CAPS: 25.0000 mg | ORAL_CAPSULE | Freq: Every day | ORAL | Status: DC

## 2012-08-16 NOTE — Progress Notes (Signed)
[facility] met with patient in order to complete patient assistance program for Lyrica.  Patient appreciative of the support and contact.  [facility] will follow in 1 week.  Beverly Sessions MSW, LCSW [phone]

## 2012-08-16 NOTE — Addendum Note (Signed)
Addended by: Cleora Fleet on: 08/16/2012 12:29 PM   Modules accepted: Orders

## 2012-08-16 NOTE — Progress Notes (Signed)
Patient ID: Sylvia Hall, female   DOB: [birth year], 52 y.o.   MRN: [id number]  CC: follow up   HPI: This patient been here multiple times in the past several months and she seen a different provider each time.  She apparently has relocated from [place] to this area permanently.  When she arrived here she was on multiple medications for control substances including Xanax, soma, narcotic pain medications etc.  The patient wants for this clinic to continue prescribing these things to her but we have explained to her clearly that we were not comfortable doing that.  We've referred her outpatient management.  We will refer her to psychiatry and also to a cardiologist.  She reports that the cardiologists that she saw [place] had prescribed Xanax for her because she was having palpitations.  She's been on the Xanax for over 30 years.  We've been trying to wean her off the Xanax but she reports that she has not been able to do so.  She is scheduled to see a psychiatrist later this month. She also reports that she has not been able to get the prescription filled for the Lyrica.  The patient also reports that she was denied by the pain management clinic because there is nothing to offer her.  She's not willing to see a private pain management specialist because she's not willing to pay the money to see them.   Allergies  Allergen Reactions  . Phenytoin Sodium Extended Other (See Comments)    seizures  . Aspirin Hives  . Caffeine Other (See Comments)    Heart races  . Dilantin (Phenytoin Sodium Extended)   . Food     Sodium nitrates  . Ivp Dye (Iodinated Diagnostic Agents) Hives  . Levofloxacin Hives  . Sodium Nitrate   . Sulfa Drugs Cross Reactors Hives   Past Medical History  Diagnosis Date  . Asthma   . Back pain, chronic   . Thrombophlebitis   . Mitral valve prolapse   . Epilepsy   . Stenosis of lumbosacral spine     numbness L hand and L leg  . Pericarditis    Current Outpatient  Prescriptions on File Prior to Visit  Medication Sig Dispense Refill  . albuterol (PROVENTIL HFA;VENTOLIN HFA) 108 (90 BASE) MCG/ACT inhaler Inhale 2 puffs into the lungs every 6 (six) hours as needed. For shortness of breath  1 Inhaler  3  . carisoprodol (SOMA) 350 MG tablet Take 1 tablet (350 mg total) by mouth 2 (two) times daily as needed for muscle spasms.  60 tablet  0  . docusate sodium (COLACE) 100 MG capsule Take 1 capsule (100 mg total) by mouth 2 (two) times daily.  10 capsule  0  . fexofenadine (ALLEGRA) 180 MG tablet Take 1 tablet (180 mg total) by mouth daily.  30 tablet  1  . fluticasone (FLONASE) 50 MCG/ACT nasal spray Place 1 spray into the nose 2 (two) times daily.  1 g  2  . furosemide (LASIX) 20 MG tablet Take 1/2 tablet daily prn swelling in legs.  30 tablet  0  . metoprolol succinate (TOPROL-XL) 50 MG 24 hr tablet Take 1 tablet (50 mg total) by mouth daily.  30 tablet  11  . ranitidine (ZANTAC) 75 MG tablet Take 1 tablet (75 mg total) by mouth daily as needed. ACID REFLUX  60 tablet  1   No current facility-administered medications on file prior to visit.   Family History  Problem  Relation Age of Onset  . Diabetes Mother   . Mitral valve prolapse Mother   . Asthma Mother   . Mitral valve prolapse Sister   . Asthma Sister   . Mitral valve prolapse Son   . Diabetes Maternal Uncle   . Diabetes Maternal Grandmother   . Mitral valve prolapse Sister   . CAD Mother 89  . CAD Son 20    No details available   History   Social History  . Marital Status: Divorced    Spouse Name: N/A    Number of Children: 2  . Years of Education: N/A   Occupational History  . Not on file.   Social History Main Topics  . Smoking status: Current Every Day Smoker -- 1.00 packs/day for 12 years    Types: Cigarettes  . Smokeless tobacco: Not on file  . Alcohol Use: No  . Drug Use: No  . Sexually Active: Yes    Birth Control/ Protection: None   Other Topics Concern  . Not on file    Social History Narrative   Lives alone.      Review of Systems  Constitutional: Negative for fever, chills, diaphoresis, activity change, appetite change and fatigue.  HENT: Negative for ear pain, nosebleeds, congestion, facial swelling, rhinorrhea, neck pain, neck stiffness and ear discharge.   Eyes: Negative for pain, discharge, redness, itching and visual disturbance.  Respiratory: Negative for cough, choking, chest tightness, shortness of breath, wheezing and stridor.   Cardiovascular: Negative for chest pain, palpitations and leg swelling.  Gastrointestinal: Negative for abdominal distention.  Genitourinary: Negative for dysuria, urgency, frequency, hematuria, flank pain, decreased urine volume, difficulty urinating and dyspareunia.  Musculoskeletal: chronic back pain.  Neurological: Negative for dizziness, tremors, seizures, syncope, facial asymmetry, speech difficulty, weakness, light-headedness, numbness and headaches.  Hematological: Negative for adenopathy. Does not bruise/bleed easily.  Psychiatric/Behavioral: Negative for hallucinations, behavioral problems, confusion, dysphoric mood, decreased concentration and agitation.    Objective:   Filed Vitals:   08/16/12 1109  BP: 107/76  Pulse: 93  Temp: 98.8 F (37.1 C)  Resp: 16    Physical Exam  Constitutional: Appears well-developed and well-nourished. No distress.  HENT: Normocephalic. External right and left ear normal. Oropharynx is clear and moist.  Eyes: Conjunctivae and EOM are normal. PERRLA, no scleral icterus.  Neck: Normal ROM. Neck supple. No JVD. No tracheal deviation. No thyromegaly.  CVS: RRR, S1/S2 +, no murmurs, no gallops, no carotid bruit.  Pulmonary: Effort and breath sounds normal, no stridor, rhonchi, wheezes, rales.  Abdominal: Soft. BS +,  no distension, tenderness, rebound or guarding.  Musculoskeletal: pain and spasm of back muscles Lymphadenopathy: No lymphadenopathy noted, cervical,  inguinal. Neuro: Alert. Normal reflexes, muscle tone coordination. No cranial nerve deficit. Skin: Skin is warm and dry. No rash noted. Not diaphoretic. No erythema. No pallor.  Psychiatric: Normal mood and affect. Behavior, judgment, thought content normal.   Lab Results  Component Value Date   WBC 5.5 02/10/2012   HGB 13.3 02/10/2012   HCT 42.0 02/10/2012   MCV 100.0* 02/10/2012   PLT 157 02/05/2011   Lab Results  Component Value Date   CREATININE 0.79 06/27/2012   BUN 15 06/27/2012   NA 142 06/27/2012   K 4.3 06/27/2012   CL 109 06/27/2012   CO2 20 06/27/2012    Lab Results  Component Value Date   HGBA1C 5.2 04/12/2012   Lipid Panel  No results found for this basename: chol, trig, hdl, cholhdl, vldl,  ldlcalc     Assessment and plan:   Patient Active Problem List   Diagnosis Date Noted  . Palpitations 08/16/2012  . Neuropathy 08/16/2012  . Routine gynecological examination 07/31/2012  . Neurogenic pain 04/12/2012  . Anxiety 11/03/2011  . Vitamin D deficiency 08/08/2011  . Chronic back pain 05/29/2011  . MVP (mitral valve prolapse) 05/29/2011  . Asthma 05/29/2011  . Tobacco user 05/29/2011  . Chronic mastitis 05/29/2011  . Allergic rhinitis 05/29/2011   Another long conversation spent with patient and her friend.  They want for [place] to continue to prescribe all of the controlled substances that she has been taking in [place] for years.  We don't have medical records from [place].  Pt says that she has them at home.  I asked for her to please bring them in so that we can have them scanned in.  Also, I have asked that patient Be sure and followup with the psychiatrist next week as scheduled.  I gave her a refill of her alprazolam medication to continue 3 times daily until she can be evaluated by the psychiatrist and they can determine if she needs to continue this.  According to the local cardiologist that saw her didn't feel like she needed to be on alprazolam for palpitations.  The  patient is also requesting a prescription for soma.  I did not fill that prescription today.  I told her that I thought that it would be dangerous for her to take soma in addition to taking alprazolam 3 times daily.  I also asked that she provide me with the medical records so I can review the reason why a provider wouldn't put her on that and kept her on that long term.  She verbalized understanding.     Follow up in 4 months  The patient was given clear instructions to go to ER or return to medical center if symptoms don't improve, worsen or new problems develop.  The patient verbalized understanding.  The patient was told to call to get any lab results if not heard anything in the next week.    Rodney Langton, MD, CDE, FAAFP [facility] [place], [place]

## 2012-08-16 NOTE — Patient Instructions (Addendum)
Insomnia Insomnia is frequent trouble falling and/or staying asleep. Insomnia can be a long term problem or a short term problem. Both are common. Insomnia can be a short term problem when the wakefulness is related to a certain stress or worry. Long term insomnia is often related to ongoing stress during waking hours and/or poor sleeping habits. Overtime, sleep deprivation itself can make the problem worse. Every little thing feels more severe because you are overtired and your ability to cope is decreased. CAUSES   Stress, anxiety, and depression.  Poor sleeping habits.  Distractions such as TV in the bedroom.  Naps close to bedtime.  Engaging in emotionally charged conversations before bed.  Technical reading before sleep.  Alcohol and other sedatives. They may make the problem worse. They can hurt normal sleep patterns and normal dream activity.  Stimulants such as caffeine for several hours prior to bedtime.  Pain syndromes and shortness of breath can cause insomnia.  Exercise late at night.  Changing time zones may cause sleeping problems (jet lag). It is sometimes helpful to have someone observe your sleeping patterns. They should look for periods of not breathing during the night (sleep apnea). They should also look to see how long those periods last. If you live alone or observers are uncertain, you can also be observed at a sleep clinic where your sleep patterns will be professionally monitored. Sleep apnea requires a checkup and treatment. Give your caregivers your medical history. Give your caregivers observations your family has made about your sleep.  SYMPTOMS   Not feeling rested in the morning.  Anxiety and restlessness at bedtime.  Difficulty falling and staying asleep. TREATMENT   Your caregiver may prescribe treatment for an underlying medical disorders. Your caregiver can give advice or help if you are using alcohol or other drugs for self-medication. Treatment  of underlying problems will usually eliminate insomnia problems.  Medications can be prescribed for short time use. They are generally not recommended for lengthy use.  Over-the-counter sleep medicines are not recommended for lengthy use. They can be habit forming.  You can promote easier sleeping by making lifestyle changes such as:  Using relaxation techniques that help with breathing and reduce muscle tension.  Exercising earlier in the day.  Changing your diet and the time of your last meal. No night time snacks.  Establish a regular time to go to bed.  Counseling can help with stressful problems and worry.  Soothing music and white noise may be helpful if there are background noises you cannot remove.  Stop tedious detailed work at least one hour before bedtime. HOME CARE INSTRUCTIONS   Keep a diary. Inform your caregiver about your progress. This includes any medication side effects. See your caregiver regularly. Take note of:  Times when you are asleep.  Times when you are awake during the night.  The quality of your sleep.  How you feel the next day. This information will help your caregiver care for you.  Get out of bed if you are still awake after 15 minutes. Read or do some quiet activity. Keep the lights down. Wait until you feel sleepy and go back to bed.  Keep regular sleeping and waking hours. Avoid naps.  Exercise regularly.  Avoid distractions at bedtime. Distractions include watching television or engaging in any intense or detailed activity like attempting to balance the household checkbook.  Develop a bedtime ritual. Keep a familiar routine of bathing, brushing your teeth, climbing into bed at the same   time each night, listening to soothing music. Routines increase the success of falling to sleep faster.  Use relaxation techniques. This can be using breathing and muscle tension release routines. It can also include visualizing peaceful scenes. You can  also help control troubling or intruding thoughts by keeping your mind occupied with boring or repetitive thoughts like the old concept of counting sheep. You can make it more creative like imagining planting one beautiful flower after another in your backyard garden.  During your day, work to eliminate stress. When this is not possible use some of the previous suggestions to help reduce the anxiety that accompanies stressful situations. MAKE SURE YOU:   Understand these instructions.  Will watch your condition.  Will get help right away if you are not doing well or get worse. Document Released: 01/23/2000 Document Revised: 04/19/2011 Document Reviewed: 02/22/2007 [facility] Patient Information 2014 [place], [place]. Anxiety and Panic Attacks Your caregiver has informed you that you are having an anxiety or panic attack. There may be many forms of this. Most of the time these attacks come suddenly and without warning. They come at any time of day, including periods of sleep, and at any time of life. They may be strong and unexplained. Although panic attacks are very scary, they are physically harmless. Sometimes the cause of your anxiety is not known. Anxiety is a protective mechanism of the body in its fight or flight mechanism. Most of these perceived danger situations are actually nonphysical situations (such as anxiety over losing a job). CAUSES  The causes of an anxiety or panic attack are many. Panic attacks may occur in otherwise healthy people given a certain set of circumstances. There may be a genetic cause for panic attacks. Some medications may also have anxiety as a side effect. SYMPTOMS  Some of the most common feelings are:  Intense terror.  Dizziness, feeling faint.  Hot and cold flashes.  Fear of going crazy.  Feelings that nothing is real.  Sweating.  Shaking.  Chest pain or a fast heartbeat (palpitations).  Smothering, choking sensations.  Feelings of impending  doom and that death is near.  Tingling of extremities, this may be from over-breathing.  Altered reality (derealization).  Being detached from yourself (depersonalization). Several symptoms can be present to make up anxiety or panic attacks. DIAGNOSIS  The evaluation by your caregiver will depend on the type of symptoms you are experiencing. The diagnosis of anxiety or panic attack is made when no physical illness can be determined to be a cause of the symptoms. TREATMENT  Treatment to prevent anxiety and panic attacks may include:  Avoidance of circumstances that cause anxiety.  Reassurance and relaxation.  Regular exercise.  Relaxation therapies, such as yoga.  Psychotherapy with a psychiatrist or therapist.  Avoidance of caffeine, alcohol and illegal drugs.  Prescribed medication. SEEK IMMEDIATE MEDICAL CARE IF:   You experience panic attack symptoms that are different than your usual symptoms.  You have any worsening or concerning symptoms. Document Released: 01/25/2005 Document Revised: 04/19/2011 Document Reviewed: 05/29/2009 [facility] Patient Information 2014 [name], [place]. Palpitations  A palpitation is the feeling that your heartbeat is irregular or is faster than normal. It may feel like your heart is fluttering or skipping a beat. Palpitations are usually not a serious problem. However, in some cases, you may need further medical evaluation. CAUSES  Palpitations can be caused by:  Smoking.  Caffeine or other stimulants, such as diet pills or energy drinks.  Alcohol.  Stress and  anxiety.  Strenuous physical activity.  Fatigue.  Certain medicines.  Heart disease, especially if you have a history of arrhythmias. This includes atrial fibrillation, atrial flutter, or supraventricular tachycardia.  An improperly working pacemaker or defibrillator. DIAGNOSIS  To find the cause of your palpitations, your caregiver will take your history and perform a  physical exam. Tests may also be done, including:  Electrocardiography (ECG). This test records the heart's electrical activity.  Cardiac monitoring. This allows your caregiver to monitor your heart rate and rhythm in real time.  Holter monitor. This is a portable device that records your heartbeat and can help diagnose heart arrhythmias. It allows your caregiver to track your heart activity for several days, if needed.  Stress tests by exercise or by giving medicine that makes the heart beat faster. TREATMENT  Treatment of palpitations depends on the cause of your symptoms and can vary greatly. Most cases of palpitations do not require any treatment other than time, relaxation, and monitoring your symptoms. Other causes, such as atrial fibrillation, atrial flutter, or supraventricular tachycardia, usually require further treatment. HOME CARE INSTRUCTIONS   Avoid:  Caffeinated coffee, tea, soft drinks, diet pills, and energy drinks.  Chocolate.  Alcohol.  Stop smoking if you smoke.  Reduce your stress and anxiety. Things that can help you relax include:  A method that measures bodily functions so you can learn to control them (biofeedback).  Yoga.  Meditation.  Physical activity such as swimming, jogging, or walking.  Get plenty of rest and sleep. SEEK MEDICAL CARE IF:   You continue to have a fast or irregular heartbeat beyond 24 hours.  Your palpitations occur more often. SEEK IMMEDIATE MEDICAL CARE IF:  You develop chest pain or shortness of breath.  You have a severe headache.  You feel dizzy, or you faint. MAKE SURE YOU:  Understand these instructions.  Will watch your condition.  Will get help right away if you are not doing well or get worse. Document Released: 01/23/2000 Document Revised: 07/27/2011 Document Reviewed: 03/26/2011 [facility] Patient Information 2014 [place], [place].

## 2012-08-18 ENCOUNTER — Other Ambulatory Visit: Payer: Self-pay | Admitting: Internal Medicine

## 2012-08-18 NOTE — Telephone Encounter (Signed)
Refill request for flexeril

## 2012-08-21 ENCOUNTER — Other Ambulatory Visit: Payer: Self-pay

## 2012-08-24 ENCOUNTER — Ambulatory Visit (INDEPENDENT_AMBULATORY_CARE_PROVIDER_SITE_OTHER): Payer: PRIVATE HEALTH INSURANCE | Admitting: Psychiatry

## 2012-08-24 DIAGNOSIS — F4311 Post-traumatic stress disorder, acute: Secondary | ICD-10-CM | POA: Insufficient documentation

## 2012-08-24 DIAGNOSIS — F4310 Post-traumatic stress disorder, unspecified: Secondary | ICD-10-CM

## 2012-08-24 MED ORDER — CLONAZEPAM 1 MG PO TABS: ORAL_TABLET | ORAL | Status: DC

## 2012-08-24 MED ORDER — SERTRALINE HCL 100 MG PO TABS: 100.0000 mg | ORAL_TABLET | Freq: Every morning | ORAL | Status: DC
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
## 2012-08-31 ENCOUNTER — Encounter: Payer: Self-pay | Admitting: *Deleted

## 2012-08-31 ENCOUNTER — Other Ambulatory Visit: Payer: Self-pay

## 2012-09-01 ENCOUNTER — Encounter: Payer: Self-pay | Admitting: *Deleted

## 2012-09-04 ENCOUNTER — Telehealth: Payer: Self-pay | Admitting: *Deleted

## 2012-09-04 NOTE — Telephone Encounter (Signed)
Patient states that she has received her Lyrica.  However, this clinic has received a letter requesting additional information about patient's allergies.  CSW will share this survey with patient for them to respond.  CSW will continue to follow as needed. No further medication needs at this time.  Beverly Sessions MSW, LCSW [phone]

## 2012-10-19 ENCOUNTER — Ambulatory Visit (INDEPENDENT_AMBULATORY_CARE_PROVIDER_SITE_OTHER): Payer: PRIVATE HEALTH INSURANCE | Admitting: Psychiatry

## 2012-10-19 ENCOUNTER — Encounter (HOSPITAL_COMMUNITY): Payer: Self-pay

## 2012-10-19 DIAGNOSIS — F4322 Adjustment disorder with anxiety: Secondary | ICD-10-CM

## 2012-10-19 DIAGNOSIS — F603 Borderline personality disorder: Secondary | ICD-10-CM | POA: Insufficient documentation

## 2012-10-19 MED ORDER — CLONAZEPAM 1 MG PO TABS: ORAL_TABLET | ORAL | Status: DC

## 2012-10-19 MED ORDER — SERTRALINE HCL 100 MG PO TABS: 100.0000 mg | ORAL_TABLET | Freq: Every morning | ORAL | Status: DC
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
## 2012-12-06 ENCOUNTER — Other Ambulatory Visit: Payer: Self-pay | Admitting: Internal Medicine

## 2012-12-07 ENCOUNTER — Encounter (HOSPITAL_COMMUNITY): Payer: Self-pay | Admitting: Licensed Clinical Social Worker

## 2012-12-07 ENCOUNTER — Ambulatory Visit (INDEPENDENT_AMBULATORY_CARE_PROVIDER_SITE_OTHER): Payer: PRIVATE HEALTH INSURANCE | Admitting: Licensed Clinical Social Worker

## 2012-12-07 ENCOUNTER — Encounter (INDEPENDENT_AMBULATORY_CARE_PROVIDER_SITE_OTHER): Payer: Self-pay

## 2012-12-07 DIAGNOSIS — F4310 Post-traumatic stress disorder, unspecified: Secondary | ICD-10-CM

## 2012-12-07 DIAGNOSIS — F332 Major depressive disorder, recurrent severe without psychotic features: Secondary | ICD-10-CM

## 2012-12-07 DIAGNOSIS — F4311 Post-traumatic stress disorder, acute: Secondary | ICD-10-CM

## 2012-12-07 NOTE — Addendum Note (Signed)
Addended by: Remus Loffler on: 12/07/2012 02:01 PM   Modules accepted: Level of Service
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
## 2012-12-08 ENCOUNTER — Encounter: Payer: Self-pay | Admitting: Internal Medicine

## 2012-12-08 ENCOUNTER — Ambulatory Visit: Payer: PRIVATE HEALTH INSURANCE | Attending: Internal Medicine | Admitting: Internal Medicine

## 2012-12-08 VITALS — BP 108/76 | HR 77 | Temp 98.0°F | Resp 16 | Ht 63.0 in | Wt 198.0 lb

## 2012-12-08 DIAGNOSIS — J019 Acute sinusitis, unspecified: Secondary | ICD-10-CM

## 2012-12-08 DIAGNOSIS — M549 Dorsalgia, unspecified: Secondary | ICD-10-CM

## 2012-12-08 DIAGNOSIS — R600 Localized edema: Secondary | ICD-10-CM

## 2012-12-08 DIAGNOSIS — M62838 Other muscle spasm: Secondary | ICD-10-CM

## 2012-12-08 DIAGNOSIS — F411 Generalized anxiety disorder: Secondary | ICD-10-CM

## 2012-12-08 DIAGNOSIS — F419 Anxiety disorder, unspecified: Secondary | ICD-10-CM

## 2012-12-08 DIAGNOSIS — R002 Palpitations: Secondary | ICD-10-CM

## 2012-12-08 DIAGNOSIS — R609 Edema, unspecified: Secondary | ICD-10-CM

## 2012-12-08 DIAGNOSIS — G8929 Other chronic pain: Secondary | ICD-10-CM

## 2012-12-08 DIAGNOSIS — J069 Acute upper respiratory infection, unspecified: Secondary | ICD-10-CM

## 2012-12-08 MED ORDER — GUAIFENESIN-DM 100-10 MG/5ML PO SYRP: 5.0000 mL | ORAL_SOLUTION | Freq: Three times a day (TID) | ORAL | Status: DC | PRN

## 2012-12-08 MED ORDER — AZITHROMYCIN 250 MG PO TABS: ORAL_TABLET | ORAL | Status: DC

## 2012-12-08 MED ORDER — PREGABALIN 25 MG PO CAPS: 25.0000 mg | ORAL_CAPSULE | Freq: Every day | ORAL | Status: DC

## 2012-12-08 MED ORDER — CARISOPRODOL 350 MG PO TABS: 350.0000 mg | ORAL_TABLET | Freq: Two times a day (BID) | ORAL | Status: DC | PRN

## 2012-12-08 MED ORDER — FUROSEMIDE 20 MG PO TABS: ORAL_TABLET | ORAL | Status: DC

## 2012-12-08 NOTE — Progress Notes (Signed)
Patient ID: Sylvia Hall, female   DOB: 09-Mar-1960, 52 y.o.   MRN: [id number] Patient Demographics  Sylvia Hall, is a 52 y.o. female  XLK:[id number]  [id number]  DOB - 05-12-1960  Chief Complaint  Patient presents with  . Follow-up        Subjective:   Sylvia Hall is a 52 y.o. female here today for a follow up visit. Patient claims to be having stuffy nose, fever and cough the last 3-5 days, cough is not productive of greenish yellow sputum, changed color from white, she has chest pain and abdominal pain because of coughing. She denies respiratory distress. She continues to smoke cigarette. He denies dizziness. Her nose is stuffy she has to blow it so hard that sometimes it bleeds. Patient has No headache, No chest pain, No abdominal pain - No Nausea, No new weakness tingling or numbness, No Cough - SOB.  ALLERGIES: Allergies  Allergen Reactions  . Phenytoin Sodium Extended Other (See Comments)    seizures  . Aspirin Hives  . Caffeine Other (See Comments)    Heart races  . Dilantin [Phenytoin Sodium Extended]   . Food     Sodium nitrates  . Ivp Dye [Iodinated Diagnostic Agents] Hives  . Levofloxacin Hives  . Sodium Nitrate   . Sulfa Drugs Cross Reactors Hives    PAST MEDICAL HISTORY: Past Medical History  Diagnosis Date  . Asthma   . Back pain, chronic   . Thrombophlebitis   . Mitral valve prolapse   . Epilepsy   . Stenosis of lumbosacral spine     numbness L hand and L leg  . Pericarditis   . Depression   . PTSD (post-traumatic stress disorder)   . Anxiety   . Fatigue     MEDICATIONS AT HOME: Prior to Admission medications   Medication Sig Start Date End Date Taking? Authorizing Provider  albuterol (PROVENTIL HFA;VENTOLIN HFA) 108 (90 BASE) MCG/ACT inhaler Inhale 2 puffs into the lungs every 6 (six) hours as needed. For shortness of breath 04/12/12 04/12/13 Yes Leroy Sea, MD  carisoprodol (SOMA) 350 MG tablet Take 1 tablet (350 mg total) by mouth 2  (two) times daily as needed for muscle spasms. 12/08/12  Yes Jeanann Lewandowsky, MD  clonazePAM (KLONOPIN) 1 MG tablet 1 qam  2  qhs 10/19/12  Yes Archer Asa, MD  metoprolol succinate (TOPROL-XL) 50 MG 24 hr tablet Take 1 tablet (50 mg total) by mouth daily. 06/26/12 06/26/13 Yes Rollene Rotunda, MD  sertraline (ZOLOFT) 100 MG tablet Take 1 tablet (100 mg total) by mouth every morning. 10/19/12  Yes Archer Asa, MD  azithromycin (ZITHROMAX Z-PAK) 250 MG tablet Take 2 tablets today, then 250 mg daily for 5 days 12/08/12   Jeanann Lewandowsky, MD  cyclobenzaprine (FLEXERIL) 10 MG tablet TAKE 1 TABLET BY MOUTH AT BEDTIME AS NEEDED FOR MUSCLE SPASMS 12/06/12   Richarda Overlie, MD  docusate sodium (COLACE) 100 MG capsule Take 1 capsule (100 mg total) by mouth 2 (two) times daily. 04/12/12   Leroy Sea, MD  fexofenadine (ALLEGRA) 180 MG tablet Take 1 tablet (180 mg total) by mouth daily. 05/25/12   Linna Hoff, MD  fluticasone (FLONASE) 50 MCG/ACT nasal spray Place 1 spray into the nose 2 (two) times daily. 05/25/12   Linna Hoff, MD  furosemide (LASIX) 20 MG tablet Take 1/2 tablet daily prn swelling in legs. 12/08/12   Jeanann Lewandowsky, MD  guaiFENesin-dextromethorphan (ROBITUSSIN DM) 100-10 MG/5ML syrup Take 5 mLs by  mouth 3 (three) times daily as needed for cough. 12/08/12   Jeanann Lewandowsky, MD  pregabalin (LYRICA) 25 MG capsule Take 1 capsule (25 mg total) by mouth daily. 12/08/12   Jeanann Lewandowsky, MD  ranitidine (ZANTAC) 75 MG tablet Take 1 tablet (75 mg total) by mouth daily as needed. ACID REFLUX 05/26/12   Maretta Bees, MD     Objective:   Filed Vitals:   12/08/12 1255  BP: 108/76  Pulse: 77  Temp: 98 F (36.7 C)  TempSrc: Oral  Resp: 16  Height: 5\' 3"  (1.6 m)  Weight: 198 lb (89.812 kg)  SpO2: 97%    Exam General appearance : Awake, alert, not in any distress. Speech Clear. Not toxic looking HEENT: Atraumatic and Normocephalic, stuffy nose, pupils equally reactive to  light and accomodation Neck: supple, no JVD. No cervical lymphadenopathy.  Chest:Good air entry bilaterally, no added sounds  CVS: S1 S2 regular, no murmurs.  Abdomen: Bowel sounds present, Non tender and not distended with no gaurding, rigidity or rebound. Extremities: B/L Lower Ext shows no edema, both legs are warm to touch Neurology: Awake alert, and oriented X 3, CN II-XII intact, Non focal Skin:No Rash Wounds:N/A   Data Review   CBC No results found for this basename: WBC, HGB, HCT, PLT, MCV, MCH, MCHC, RDW, NEUTRABS, LYMPHSABS, MONOABS, EOSABS, BASOSABS, BANDABS, BANDSABD,  in the last 168 hours  Chemistries   No results found for this basename: NA, K, CL, CO2, GLUCOSE, BUN, CREATININE, GFRCGP, CALCIUM, MG, AST, ALT, ALKPHOS, BILITOT,  in the last 168 hours ------------------------------------------------------------------------------------------------------------------ No results found for this basename: HGBA1C,  in the last 72 hours ------------------------------------------------------------------------------------------------------------------ No results found for this basename: CHOL, HDL, LDLCALC, TRIG, CHOLHDL, LDLDIRECT,  in the last 72 hours ------------------------------------------------------------------------------------------------------------------ No results found for this basename: TSH, T4TOTAL, FREET3, T3FREE, THYROIDAB,  in the last 72 hours ------------------------------------------------------------------------------------------------------------------ No results found for this basename: VITAMINB12, FOLATE, FERRITIN, TIBC, IRON, RETICCTPCT,  in the last 72 hours  Coagulation profile  No results found for this basename: INR, PROTIME,  in the last 168 hours    Assessment & Plan   Patient Active Problem List   Diagnosis Date Noted  . Acute sinusitis, unspecified 12/08/2012  . Muscle spasm of right leg 12/08/2012  . Acute upper respiratory infections of  unspecified site 12/08/2012  . Major depressive disorder, recurrent episode, severe, without mention of psychotic behavior 12/07/2012  . Borderline personality disorder 10/19/2012  . Acute posttraumatic stress disorder 08/24/2012  . Palpitations 08/16/2012  . Neuropathy 08/16/2012  . Restless leg syndrome 08/16/2012  . Routine gynecological examination 07/31/2012  . Neurogenic pain 04/12/2012  . Anxiety 11/03/2011  . Vitamin D deficiency 08/08/2011  . Chronic back pain 05/29/2011  . MVP (mitral valve prolapse) 05/29/2011  . Asthma 05/29/2011  . Tobacco user 05/29/2011  . Chronic mastitis 05/29/2011  . Allergic rhinitis 05/29/2011     Plan: Refill medications  Upper respiratory tract infection Zithromax 250 mg capsule, take 2 today then 1 daily for 5 days Robitussin 5 meals by mouth 3 times a day She has been educated on symptomatic treatment including plenty of water intake  Follow up in 3 months or when necessary  The patient was given clear instructions to go to ER or return to medical center if symptoms don't improve, worsen or new problems develop. The patient verbalized understanding. The patient was told to call to get lab results if they haven't heard anything in the next week.  Jeanann Lewandowsky, MD, MHA, FACP, FAAP [facility] [place], [place] [phone]   12/08/2012, 4:37 PM

## 2012-12-08 NOTE — Progress Notes (Signed)
Pt is here today presenting a nasty cold that she has had for over 2 weeks. She has a runny nose, chest and rib pains from coughing. Pt said at one point her nose started bleeding.

## 2012-12-21 ENCOUNTER — Ambulatory Visit (HOSPITAL_COMMUNITY): Payer: Self-pay | Admitting: Psychiatry

## 2012-12-22 ENCOUNTER — Ambulatory Visit (HOSPITAL_COMMUNITY): Payer: Self-pay | Admitting: Psychiatry

## 2012-12-27 ENCOUNTER — Ambulatory Visit: Payer: Self-pay

## 2013-01-08 ENCOUNTER — Ambulatory Visit (HOSPITAL_COMMUNITY): Payer: Self-pay | Admitting: Licensed Clinical Social Worker

## 2013-01-15 ENCOUNTER — Encounter (HOSPITAL_COMMUNITY): Payer: Self-pay | Admitting: Licensed Clinical Social Worker

## 2013-01-15 ENCOUNTER — Ambulatory Visit (HOSPITAL_COMMUNITY): Payer: Self-pay | Admitting: Licensed Clinical Social Worker

## 2013-01-15 NOTE — Progress Notes (Signed)
Patient ID: Sylvia Hall, female   DOB: September 10, 1960, 52 y.o.   MRN: [id number] Patient was a no show no call.

## 2013-01-19 ENCOUNTER — Other Ambulatory Visit (HOSPITAL_COMMUNITY): Payer: Self-pay | Admitting: Psychiatry

## 2013-01-19 DIAGNOSIS — F603 Borderline personality disorder: Secondary | ICD-10-CM

## 2013-01-22 NOTE — Telephone Encounter (Signed)
Chart reviewed, refill appropriate. Notified patient that refill was called in to her pharmacy.

## 2013-02-19 ENCOUNTER — Other Ambulatory Visit (HOSPITAL_COMMUNITY): Payer: Self-pay | Admitting: *Deleted

## 2013-02-19 DIAGNOSIS — F603 Borderline personality disorder: Secondary | ICD-10-CM

## 2013-02-19 MED ORDER — CLONAZEPAM 1 MG PO TABS: ORAL_TABLET | ORAL | Status: DC

## 2013-02-28 ENCOUNTER — Encounter (INDEPENDENT_AMBULATORY_CARE_PROVIDER_SITE_OTHER): Payer: Self-pay

## 2013-02-28 ENCOUNTER — Ambulatory Visit (INDEPENDENT_AMBULATORY_CARE_PROVIDER_SITE_OTHER): Payer: PRIVATE HEALTH INSURANCE | Admitting: Psychiatry

## 2013-02-28 DIAGNOSIS — F603 Borderline personality disorder: Secondary | ICD-10-CM

## 2013-02-28 MED ORDER — TRAZODONE HCL 100 MG PO TABS: 100.0000 mg | ORAL_TABLET | Freq: Every day | ORAL | Status: DC

## 2013-02-28 MED ORDER — SERTRALINE HCL 100 MG PO TABS: 100.0000 mg | ORAL_TABLET | Freq: Every morning | ORAL | Status: DC

## 2013-02-28 MED ORDER — CLONAZEPAM 1 MG PO TABS: ORAL_TABLET | ORAL | Status: DC

## 2013-02-28 NOTE — Progress Notes (Signed)
[facility] MD Progress Note  02/28/2013 4:45 PM Sylvia Hall  MRN:  [id number] Subjective: Feel really nervous Today the patient is seen alone. She did bring her friend Sylvia Hall who she lives with. He'll be evaluated with the patient on her next visit. The patient says different than her last visit she feels more depressed and more anxious. She says that she's not sleeping at all. She's eating well and can still concentrate she gets on the computer all the time does computer games. The patient is off of all pain medications. She's been [place] on Lyrica. She has spinal stenosis. She tried Lyrica but actually has stopped it. She said it made her gain weight. The patient takes Klonopin one in the morning and 2 at night and says she doesn't feel it very much. The patient is off Xanax. She is taking 50 mg of Zoloft. The patient does describe some degree of irritability. She does not shows no recent history of any self destructive behavior. The patient denies feeling worthless. She denies being suicidal. The patient is in a sexual relationship with Sylvia Hall and seems to be very stable with this. The patient has chronic mild suspiciousness but shows no evidence of psychosis. Interview of hallucinations she describes which he thinks of his visual hallucinations which I believe are dismiss interpretation of dark shadows at night. I do not believe they're true visual hallucinations. She describes it seemed to be whispering auditory hallucinations since she was a child. They're not distressful to her I do not think they're typical of auditory hallucinations. I suggested to her that it may be competitive thoughts in her brain. At this time the patient is not suicidal or dangerous to herself or others. The patient did see Sylvia Hall brown one time but missed her next appointment. She plans to make another appointment. Diagnosis:   DSM5: Schizophrenia Disorders:   Obsessive-Compulsive Disorders:   Trauma-Stressor Disorders:    Substance/Addictive Disorders:   Depressive Disorders:    Axis II: Borderline Personality Dis.  ADL's:  Intact  Sleep: Good  Appetite:  Good  Suicidal Ideation:  no Homicidal Ideation:  non AEB (as evidenced by):  Psychiatric Specialty Exam: ROS  There were no vitals taken for this visit.There is no weight on file to calculate BMI.  General Appearance: Casual  Eye Contact::  Fair  Speech:  Clear and Coherent  Volume:  Normal  Mood:  Euthymic  Affect:  Appropriate  Thought Process:  Goal Directed  Orientation:  Full (Time, Place, and Person)  Thought Content:  WDL  Suicidal Thoughts:  No  Homicidal Thoughts:  No  Memory:  nl  Judgement:  Good  Insight:  Fair  Psychomotor Activity:  Normal  Concentration:  Good  Recall:  Good  Akathisia:  No  Handed:  Right  AIMS (if indicated):     Assets:  Desire for Improvement  Sleep:      Current Medications: Current Outpatient Prescriptions  Medication Sig Dispense Refill  . albuterol (PROVENTIL HFA;VENTOLIN HFA) 108 (90 BASE) MCG/ACT inhaler Inhale 2 puffs into the lungs every 6 (six) hours as needed. For shortness of breath  1 Inhaler  3  . azithromycin (ZITHROMAX Z-PAK) 250 MG tablet Take 2 tablets today, then 250 mg daily for 5 days  6 each  0  . carisoprodol (SOMA) 350 MG tablet Take 1 tablet (350 mg total) by mouth 2 (two) times daily as needed for muscle spasms.  60 tablet  0  . clonazePAM (KLONOPIN) 1 MG tablet  TAKE 1 TABLET BY MOUTH EVERY MORNING AND 2 TABLETS AT BEDTIME  90 tablet  2  . cyclobenzaprine (FLEXERIL) 10 MG tablet TAKE 1 TABLET BY MOUTH AT BEDTIME AS NEEDED FOR MUSCLE SPASMS  30 tablet  3  . docusate sodium (COLACE) 100 MG capsule Take 1 capsule (100 mg total) by mouth 2 (two) times daily.  10 capsule  0  . fexofenadine (ALLEGRA) 180 MG tablet Take 1 tablet (180 mg total) by mouth daily.  30 tablet  1  . fluticasone (FLONASE) 50 MCG/ACT nasal spray Place 1 spray into the nose 2 (two) times daily.  1 g   2  . furosemide (LASIX) 20 MG tablet Take 1/2 tablet daily prn swelling in legs.  30 tablet  0  . guaiFENesin-dextromethorphan (ROBITUSSIN DM) 100-10 MG/5ML syrup Take 5 mLs by mouth 3 (three) times daily as needed for cough.  118 mL  0  . metoprolol succinate (TOPROL-XL) 50 MG 24 hr tablet Take 1 tablet (50 mg total) by mouth daily.  30 tablet  11  . pregabalin (LYRICA) 25 MG capsule Take 1 capsule (25 mg total) by mouth daily.  90 capsule  3  . ranitidine (ZANTAC) 75 MG tablet Take 1 tablet (75 mg total) by mouth daily as needed. ACID REFLUX  60 tablet  1  . sertraline (ZOLOFT) 100 MG tablet Take 1 tablet (100 mg total) by mouth every morning.  30 tablet  5  . traZODone (DESYREL) 100 MG tablet Take 1 tablet (100 mg total) by mouth at bedtime. 1 QHs  May increase to 2 QHS  60 tablet  5   No current facility-administered medications for this visit.    Lab Results: No results found for this or any previous visit (from the past 48 hour(s)).  Physical Findings: AIMS:  , ,  ,  ,    CIWA:    COWS:     Treatment Plan Summary: At this time we will make a slight adjustment to this patient's medicines. She was taking Klonopin one in the morning and 2 at night and will change that to taking one in the morning and 2 in the afternoon. She'll take no Klonopin at night to continue the same total dose of 3 mg this time to begin on trazodone 100 mg at night for sleep. She may increase it to 2 a trazodone to. Today we'll go ahead and increase her Zoloft from 50 mg to 100 mg and we will remind her to make an appointment with Sylvia DikeJennifer brown. This patient to return to see me in approximately 2 months. Plan:  Medical Decision Making Problem Points:  Established problem, worsening (2) Data Points:  Review of new medications or change in dosage (2)  I certify that inpatient services furnished can reasonably be expected to improve the patient's condition.   Lucas MallowLOVSKY, Kinleigh Nault IRVING 02/28/2013, 4:45 PM

## 2013-04-08 ENCOUNTER — Emergency Department (HOSPITAL_COMMUNITY)
Admission: EM | Admit: 2013-04-08 | Discharge: 2013-04-08 | Disposition: A | Discharge status: Home / Self Care | Payer: PRIVATE HEALTH INSURANCE | Attending: Emergency Medicine | Admitting: Emergency Medicine

## 2013-04-08 ENCOUNTER — Emergency Department (HOSPITAL_COMMUNITY): Payer: PRIVATE HEALTH INSURANCE

## 2013-04-08 ENCOUNTER — Encounter (HOSPITAL_COMMUNITY): Payer: Self-pay | Admitting: Emergency Medicine

## 2013-04-08 DIAGNOSIS — Y9389 Activity, other specified: Secondary | ICD-10-CM | POA: Insufficient documentation

## 2013-04-08 DIAGNOSIS — Z8739 Personal history of other diseases of the musculoskeletal system and connective tissue: Secondary | ICD-10-CM | POA: Insufficient documentation

## 2013-04-08 DIAGNOSIS — J45909 Unspecified asthma, uncomplicated: Secondary | ICD-10-CM | POA: Insufficient documentation

## 2013-04-08 DIAGNOSIS — F329 Major depressive disorder, single episode, unspecified: Secondary | ICD-10-CM | POA: Insufficient documentation

## 2013-04-08 DIAGNOSIS — S43006A Unspecified dislocation of unspecified shoulder joint, initial encounter: Secondary | ICD-10-CM

## 2013-04-08 DIAGNOSIS — Z8672 Personal history of thrombophlebitis: Secondary | ICD-10-CM | POA: Insufficient documentation

## 2013-04-08 DIAGNOSIS — F17200 Nicotine dependence, unspecified, uncomplicated: Secondary | ICD-10-CM | POA: Insufficient documentation

## 2013-04-08 DIAGNOSIS — F411 Generalized anxiety disorder: Secondary | ICD-10-CM | POA: Insufficient documentation

## 2013-04-08 DIAGNOSIS — G8929 Other chronic pain: Secondary | ICD-10-CM | POA: Insufficient documentation

## 2013-04-08 DIAGNOSIS — G40909 Epilepsy, unspecified, not intractable, without status epilepticus: Secondary | ICD-10-CM | POA: Insufficient documentation

## 2013-04-08 DIAGNOSIS — I059 Rheumatic mitral valve disease, unspecified: Secondary | ICD-10-CM | POA: Insufficient documentation

## 2013-04-08 DIAGNOSIS — F4310 Post-traumatic stress disorder, unspecified: Secondary | ICD-10-CM | POA: Insufficient documentation

## 2013-04-08 DIAGNOSIS — Z79899 Other long term (current) drug therapy: Secondary | ICD-10-CM | POA: Insufficient documentation

## 2013-04-08 DIAGNOSIS — F3289 Other specified depressive episodes: Secondary | ICD-10-CM | POA: Insufficient documentation

## 2013-04-08 DIAGNOSIS — S43036A Inferior dislocation of unspecified humerus, initial encounter: Secondary | ICD-10-CM | POA: Insufficient documentation

## 2013-04-08 DIAGNOSIS — S43016A Anterior dislocation of unspecified humerus, initial encounter: Secondary | ICD-10-CM | POA: Insufficient documentation

## 2013-04-08 DIAGNOSIS — R296 Repeated falls: Secondary | ICD-10-CM | POA: Insufficient documentation

## 2013-04-08 DIAGNOSIS — Y929 Unspecified place or not applicable: Secondary | ICD-10-CM | POA: Insufficient documentation

## 2013-04-08 MED ORDER — SODIUM CHLORIDE 0.9 % IV SOLN
Freq: Once | INTRAVENOUS | Status: AC
  Administered 2013-04-08: 06:00:00 via INTRAVENOUS

## 2013-04-08 MED ORDER — HYDROCODONE-ACETAMINOPHEN 5-325 MG PO TABS: 1.0000 | ORAL_TABLET | Freq: Four times a day (QID) | ORAL | Status: DC | PRN

## 2013-04-08 MED ORDER — OXYCODONE-ACETAMINOPHEN 5-325 MG PO TABS
2.0000 | ORAL_TABLET | Freq: Once | ORAL | Status: AC
  Administered 2013-04-08: 2 via ORAL
  Filled 2013-04-08: qty 2

## 2013-04-08 MED ORDER — ETOMIDATE 2 MG/ML IV SOLN
10.0000 mg | Freq: Once | INTRAVENOUS | Status: AC
  Filled 2013-04-08: qty 10

## 2013-04-08 MED ORDER — ETOMIDATE 2 MG/ML IV SOLN
INTRAVENOUS | Status: AC | PRN
  Administered 2013-04-08: 10 mg via INTRAVENOUS

## 2013-04-08 NOTE — ED Notes (Signed)
Norlene Campbelltter, MD and Manus RuddSchulz, NP at bedside.

## 2013-04-08 NOTE — ED Provider Notes (Signed)
CSN: [id number]     Arrival date & time 04/08/13  0433 History   First MD Initiated Contact with Patient 04/08/13 0449     Chief Complaint  Patient presents with  . Shoulder Pain  . Fall     (Consider location/radiation/quality/duration/timing/severity/associated sxs/prior Treatment) HPI Comments: paient fell over dogs in the kitchen now with R should pain and decreased ROM   The history is provided by the patient.    Past Medical History  Diagnosis Date  . Asthma   . Back pain, chronic   . Thrombophlebitis   . Mitral valve prolapse   . Epilepsy   . Stenosis of lumbosacral spine     numbness L hand and L leg  . Pericarditis   . Depression   . PTSD (post-traumatic stress disorder)   . Anxiety   . Fatigue    Past Surgical History  Procedure Laterality Date  . Nose surgery      s/p trauma  . Appendectomy    . Cesarean section      x 2  . Ovary surgery    . Uterine fibroid surgery    . Breast lumpectomy    . Breast lumpectomy      Right breast  . Biopsy breast      Right breast x 10  . Rhinoplasty     Family History  Problem Relation Age of Onset  . Diabetes Mother   . Mitral valve prolapse Mother   . Asthma Mother   . CAD Mother 6073  . Anxiety disorder Mother   . Mitral valve prolapse Sister   . Asthma Sister   . Mitral valve prolapse Son   . Alcohol abuse Son   . Diabetes Maternal Uncle   . Diabetes Maternal Grandmother   . Mitral valve prolapse Sister   . CAD Son 6123    No details available  . Alcohol abuse Father   . Alcohol abuse Brother    History  Substance Use Topics  . Smoking status: Current Every Day Smoker -- 1.00 packs/day for 12 years    Types: Cigarettes  . Smokeless tobacco: Not on file  . Alcohol Use: No   OB History   Grav Para Term Preterm Abortions TAB SAB Ect Mult Living   2 2 0 2      2     Review of Systems  Respiratory: Negative for shortness of breath.   Musculoskeletal: Positive for joint swelling.  Neurological:  Negative for dizziness, numbness and headaches.      Allergies  Phenytoin sodium extended; Aspirin; Caffeine; Dilantin; Food; Ivp dye; Levofloxacin; Sulfa drugs cross reactors; and Sodium nitrate  Home Medications   Current Outpatient Rx  Name  Route  Sig  Dispense  Refill  . albuterol (PROVENTIL HFA;VENTOLIN HFA) 108 (90 BASE) MCG/ACT inhaler   Inhalation   Inhale 2 puffs into the lungs every 6 (six) hours as needed. For shortness of breath   1 Inhaler   3   . clonazePAM (KLONOPIN) 1 MG tablet   Oral   Take 1-2 mg by mouth See admin instructions. Take 1 tablet in the morning and 2 tablets at night         . furosemide (LASIX) 20 MG tablet   Oral   Take 10 mg by mouth daily as needed for edema.         . metoprolol succinate (TOPROL-XL) 50 MG 24 hr tablet   Oral   Take 1 tablet (50  mg total) by mouth daily.   30 tablet   11   . sertraline (ZOLOFT) 100 MG tablet   Oral   Take 1 tablet (100 mg total) by mouth every morning.   30 tablet   5   . traZODone (DESYREL) 100 MG tablet   Oral   Take 100-200 mg by mouth at bedtime.         Marland Kitchen HYDROcodone-acetaminophen (NORCO/VICODIN) 5-325 MG per tablet   Oral   Take 1 tablet by mouth every 6 (six) hours as needed.   14 tablet   0    BP 118/64  Pulse 84  Temp(Src) 98.2 F (36.8 C) (Oral)  Resp 13  SpO2 98% Physical Exam  Nursing note and vitals reviewed. Constitutional: She is oriented to person, place, and time. She appears well-developed and well-nourished.  HENT:  Head: Normocephalic.  Eyes: Pupils are equal, round, and reactive to light.  Cardiovascular: Normal rate and regular rhythm.   Pulmonary/Chest: Effort normal and breath sounds normal.  Abdominal: Soft.  Musculoskeletal: She exhibits tenderness. She exhibits no edema.       Right shoulder: She exhibits decreased range of motion, tenderness, deformity and pain. She exhibits no effusion and no spasm.       Arms: Full ROM at elbow, wrist with  strong distal pulses  Neurological: She is alert and oriented to person, place, and time.  Skin: Skin is warm. No erythema.    ED Course  Procedures (including critical care time) Labs Review Labs Reviewed - No data to display Imaging Review Dg Shoulder Right  04/08/2013   CLINICAL DATA:  Right shoulder pain status post fall.  EXAM: RIGHT SHOULDER - 2+ VIEW  COMPARISON:  None.  FINDINGS: There is anterior-inferior dislocation of the right humeral head, without a definite Hill-Sachs injury. No osseous Bankart lesion is seen. There is no definite evidence of fracture. No displaced rib fractures are identified. The right acromioclavicular joint is unremarkable in appearance.  The visualized portions of the right lung are clear. No significant soft tissue abnormalities are characterized on radiograph.  IMPRESSION: Anterior-inferior dislocation of the right humeral head. No definite evidence of fracture.   Electronically Signed   By: Roanna Raider M.D.   On: 04/08/2013 05:31   Dg Humerus Right  04/08/2013   CLINICAL DATA:  Right shoulder pain, status post fall.  EXAM: RIGHT HUMERUS - 2+ VIEW  COMPARISON:  None.  FINDINGS: There is anterior-inferior dislocation of the right humeral head, better characterized on concurrent right shoulder radiographs. There is no definite evidence of fracture. The distal right humerus appears intact. The elbow joint is incompletely assessed, but appears grossly unremarkable. No significant soft tissue abnormalities are identified on radiograph. The right acromioclavicular joint is grossly unremarkable in appearance. The visualized portions of the right lung are clear.  IMPRESSION: Anterior-inferior dislocation of the right humeral head. No definite evidence of fracture.   Electronically Signed   By: Roanna Raider M.D.   On: 04/08/2013 05:32     EKG Interpretation None     Dr. Norlene Campbell preformed conscience sedation to relocate shoulder  Now awaiting post reduction  xrays Arm placed in sling  Strong radial pulses, pain reduction  MDM   Final diagnoses:  Shoulder dislocation        Arman Filter, NP 04/08/13 1610  Arman Filter, NP 04/18/13 0210

## 2013-04-08 NOTE — ED Notes (Signed)
C/o R shoulder pain, possible shoulder deformity, guarding bracing R shoulder movement, limited ROM extreme pain, "got caught up with dogs/pets and fell". Alert, NAD, calm, steady gait, family with pt.

## 2013-04-08 NOTE — ED Provider Notes (Addendum)
Reduction of dislocation Date/Time: 04/08/2013 6:26 AM Performed by: Olivia MackieTTER, Toluwani Yadav M Authorized by: Olivia MackieTTER, Esta Carmon M Consent: Verbal consent obtained. written consent obtained. Risks and benefits: risks, benefits and alternatives were discussed Consent given by: patient Patient understanding: patient states understanding of the procedure being performed Patient consent: the patient's understanding of the procedure matches consent given Procedure consent: procedure consent matches procedure scheduled Relevant documents: relevant documents present and verified Test results: test results available and properly labeled Site marked: the operative site was marked Imaging studies: imaging studies available Required items: required blood products, implants, devices, and special equipment available Patient identity confirmed: verbally with patient Time out: Immediately prior to procedure a "time out" was called to verify the correct patient, procedure, equipment, support staff and site/side marked as required. Preparation: Patient was prepped and draped in the usual sterile fashion. Local anesthesia used: no Patient sedated: yes Sedation type: moderate (conscious) sedation Sedatives: etomidate Sedation start date/time: 04/08/2013 5:52 AM Sedation end date/time: 04/08/2013 6:03 AM Vitals: Vital signs were monitored during sedation. Patient tolerance: Patient tolerated the procedure well with no immediate complications. Comments: With external rotation and abduction of the right arm, reduction was completed.  Pt tolerated well.  Neurovascularly intact before and after    Results for orders placed in visit on 07/31/12  TSH      Result Value Ref Range   TSH 1.543  0.350 - 4.500 uIU/mL   Dg Shoulder Right  04/08/2013   CLINICAL DATA:  Right shoulder pain status post fall.  EXAM: RIGHT SHOULDER - 2+ VIEW  COMPARISON:  None.  FINDINGS: There is anterior-inferior dislocation of the right humeral head, without  a definite Hill-Sachs injury. No osseous Bankart lesion is seen. There is no definite evidence of fracture. No displaced rib fractures are identified. The right acromioclavicular joint is unremarkable in appearance.  The visualized portions of the right lung are clear. No significant soft tissue abnormalities are characterized on radiograph.  IMPRESSION: Anterior-inferior dislocation of the right humeral head. No definite evidence of fracture.   Electronically Signed   By: Roanna RaiderJeffery  Chang M.D.   On: 04/08/2013 05:31   Dg Shoulder Right Port  04/08/2013   CLINICAL DATA:  Status post reduction of right shoulder dislocation.  EXAM: PORTABLE RIGHT SHOULDER - 2+ VIEW  COMPARISON:  Right shoulder radiographs performed earlier today at 5:18 a.m.  FINDINGS: There has been successful reduction of the right humeral head dislocation. A small Hill-Sachs lesion is noted. No definite osseous Bankart lesion is identified, though the inferior glenoid is difficult to fully characterize.  The right acromioclavicular joint is unremarkable in appearance. The visualized portions of the right lung are clear. No significant soft tissue abnormalities are characterized on radiograph.  IMPRESSION: Successful reduction of right humeral head dislocation. Small Hill-Sachs lesion seen.   Electronically Signed   By: Roanna RaiderJeffery  Chang M.D.   On: 04/08/2013 06:33   Dg Humerus Right  04/08/2013   CLINICAL DATA:  Right shoulder pain, status post fall.  EXAM: RIGHT HUMERUS - 2+ VIEW  COMPARISON:  None.  FINDINGS: There is anterior-inferior dislocation of the right humeral head, better characterized on concurrent right shoulder radiographs. There is no definite evidence of fracture. The distal right humerus appears intact. The elbow joint is incompletely assessed, but appears grossly unremarkable. No significant soft tissue abnormalities are identified on radiograph. The right acromioclavicular joint is grossly unremarkable in appearance. The  visualized portions of the right lung are clear.  IMPRESSION: Anterior-inferior dislocation of the  right humeral head. No definite evidence of fracture.   Electronically Signed   By: Roanna Raider M.D.   On: 04/08/2013 05:32      Medical screening examination/treatment/procedure(s) were performed by non-physician practitioner and as supervising physician I was immediately available for consultation/collaboration.  Pt with fall at home, right shoulder dislocation.  Reduced with conscious sedation.  Plan for d/c home.  Olivia Mackie, MD 04/08/13 1610  Olivia Mackie, MD 04/08/13 [phone]

## 2013-04-08 NOTE — ED Notes (Signed)
Patient transported to X-ray 

## 2013-04-08 NOTE — ED Notes (Addendum)
DG  Shoulder post reduction completed

## 2013-04-08 NOTE — Discharge Instructions (Signed)
Shoulder Dislocation  Your shoulder is made up of three bones: the collar bone (clavicle); the shoulder blade (scapula), which includes the socket (glenoid cavity); and the upper arm bone (humerus). Your shoulder joint is the place where these bones meet. Strong, fibrous tissues hold these bones together (ligaments). Muscles and strong, fibrous tissues that connect the muscles to these bones (tendons) allow your arm to move through this joint. The range of motion of your shoulder joint is more extensive than most of your other joints, and the glenoid cavity is very shallow. That is the reason that your shoulder joint is one of the most unstable joints in your body. It is far more prone to dislocation than your other joints. Shoulder dislocation is when your humerus is forced out of your shoulder joint.  CAUSES  Shoulder dislocation is caused by a forceful impact on your shoulder. This impact usually is from an injury, such as a sports injury or a fall.  SYMPTOMS  Symptoms of shoulder dislocation include:   Deformity of your shoulder.   Intense pain.   Inability to move your shoulder joint.   Numbness, weakness, or tingling around your shoulder joint (your neck or down your arm).   Bruising or swelling around your shoulder.  DIAGNOSIS  In order to diagnose a dislocated shoulder, your caregiver will perform a physical exam. Your caregiver also may have an X-ray exam done to see if you have any broken bones. Magnetic resonance imaging (MRI) is a procedure that sometimes is done to help your caregiver see any damage to the soft tissues around your shoulder, particularly your rotator cuff tendons. Additionally, your caregiver also may have electromyography done to measure the electrical discharges produced in your muscles if you have signs or symptoms of nerve damage.  TREATMENT  A shoulder dislocation is treated by placing the humerus back in the joint (reduction). Your caregiver does this either manually (closed  reduction), by moving your humerus back into the joint through manipulation, or through surgery (open reduction). When your humerus is back in place, severe pain should improve almost immediately.  You also may need to have surgery if you have a weak shoulder joint or ligaments, and you have recurring shoulder dislocations, despite rehabilitation. In rare cases, surgery is necessary if your nerves or blood vessels are damaged during the dislocation.  After your reduction, your arm will be placed in a shoulder immobilizer or sling to keep it from moving. Your caregiver will have you wear your shoulder immoblizer or sling for 3 days to 3 weeks, depending on how serious your dislocation is. When your shoulder immobilizer or sling is removed, your caregiver may prescribe physical therapy to help improve the range of motion in your shoulder joint.  HOME CARE INSTRUCTIONS   The following measures can help to reduce pain and speed up the healing process:   Rest your injured joint. Do not move it. Avoid activities similar to the one that caused your injury.   Apply ice to your injured joint for the first day or two after your reduction or as directed by your caregiver. Applying ice helps to reduce inflammation and pain.   Put ice in a plastic bag.   Place a towel between your skin and the bag.   Leave the ice on for 15-20 minutes at a time, every 2 hours while you are awake.   Exercise your hand by squeezing a soft ball. This helps to eliminate stiffness and swelling in your hand and   shoulder immobilizer or sling becomes damaged.  Your pain becomes worse rather than better.  You lose feeling in your arm or hand, or they become white and cold. MAKE SURE YOU:   Understand these instructions.  Will watch your condition.  Will get help right away if you are not  doing well or get worse. Document Released: 10/20/2000 Document Revised: 04/19/2011 Document Reviewed: 11/15/2010 [facility]ExitCare Patient Information 2014 [place]. Wear the sling until seen by the orthopedic doctors  Call and make an appointment to be seen this week

## 2013-04-09 ENCOUNTER — Ambulatory Visit (HOSPITAL_COMMUNITY): Payer: Self-pay | Admitting: Psychiatry

## 2013-04-18 NOTE — ED Provider Notes (Signed)
Medical screening examination/treatment/procedure(s) were conducted as a shared visit with non-physician practitioner(s) and myself.  I personally evaluated the patient during the encounter.   EKG Interpretation None     Please see my note on this date for conscious sedation, reduction  Olivia Mackielga M Elam Ellis, MD 04/18/13 1622

## 2013-05-02 ENCOUNTER — Ambulatory Visit (HOSPITAL_COMMUNITY): Payer: Self-pay | Admitting: Psychiatry

## 2013-06-13 ENCOUNTER — Telehealth (HOSPITAL_COMMUNITY): Payer: Self-pay

## 2013-06-13 DIAGNOSIS — F603 Borderline personality disorder: Secondary | ICD-10-CM

## 2013-06-14 ENCOUNTER — Other Ambulatory Visit (HOSPITAL_COMMUNITY): Payer: Self-pay | Admitting: Psychiatry

## 2013-06-14 MED ORDER — CLONAZEPAM 1 MG PO TABS: ORAL_TABLET | ORAL | Status: DC

## 2013-06-14 NOTE — Telephone Encounter (Signed)
RX refilled  

## 2013-06-27 ENCOUNTER — Encounter (HOSPITAL_COMMUNITY): Payer: Self-pay | Admitting: Psychiatry

## 2013-06-27 ENCOUNTER — Encounter (INDEPENDENT_AMBULATORY_CARE_PROVIDER_SITE_OTHER): Payer: Self-pay

## 2013-06-27 ENCOUNTER — Ambulatory Visit (INDEPENDENT_AMBULATORY_CARE_PROVIDER_SITE_OTHER): Payer: PRIVATE HEALTH INSURANCE | Admitting: Psychiatry

## 2013-06-27 VITALS — BP 123/83 | HR 103 | Ht 63.0 in | Wt 216.8 lb

## 2013-06-27 DIAGNOSIS — F329 Major depressive disorder, single episode, unspecified: Secondary | ICD-10-CM

## 2013-06-27 DIAGNOSIS — F603 Borderline personality disorder: Secondary | ICD-10-CM

## 2013-06-27 DIAGNOSIS — F332 Major depressive disorder, recurrent severe without psychotic features: Secondary | ICD-10-CM

## 2013-06-27 MED ORDER — CLONAZEPAM 1 MG PO TABS: ORAL_TABLET | ORAL | Status: DC

## 2013-06-27 MED ORDER — CITALOPRAM HYDROBROMIDE 20 MG PO TABS: 20.0000 mg | ORAL_TABLET | Freq: Every day | ORAL | Status: DC

## 2013-06-27 MED ORDER — NORTRIPTYLINE HCL 25 MG PO CAPS: ORAL_CAPSULE | ORAL | Status: DC

## 2013-06-27 NOTE — Progress Notes (Signed)
[facility]  06/27/2013 3:22 PM Sylvia DesanctisBarbara Hall  MRN:  [id number] Subjective: Never well This patient was to return 3 months ago but did not. She shares with me that she has injured her right shoulder the rotator cuff. She injured it the day she was supposed to see me 3 months ago and they recommended surgery but she has not had. The patient also never got to see the therapist Sylvia Hall. At this time the patient says she's very depressed but mainly is because she's gained 25 pounds since I've seen her. The patient describes chronic problems with sleep for years. This has not changed. She seems to have a fairly good relationship with Sylvia Hall is very dedicated to her. This is their her live-in boyfriend. Today the [facility] does seem very dedicated to her. The major issue discussed today was that this patient has a chronic pain syndrome related to scoliosis and other orthopedic problems. Before she moved to this community she was on chronic opiate treatment. She's unable to get this treatment with Tegretol care that she has in this community. Hence her #1 complaint is that of chronic pain. This pain further affect her mobility. Interestingly the adjustment of her Klonopin has never been helpful and it is reasonable to begin to taper her off of her Klonopin. The trazodone that I get her has not really helped her to sleep and back she daily as of producing gastritis. We asked her increase her Zoloft from 50-100 mg but she did not because she claimed it was too expensive. The patient has purged one time yesterday. Other than that she's not been anything self-destructive. She's not cover herself. She's not suicidal. She's using no alcohol or drugs. The patient describes moderate anxiety. Diagnosis:   DSM5: Schizophrenia Disorders:   Obsessive-Compulsive Disorders:   Trauma-Stressor Disorders:   Substance/Addictive Disorders:   Depressive Disorders:   Total  Time spent with patient: 45 minutes  Axis I: Major Depression, Recurrent severe  ADL's:  Intact  Sleep  Poor  chrocally  Appetite: good  Suicidal Ideation:  no Homicidal Ideation:  none AEB (as evidenced by):  Psychiatric Specialty Exam: Physical Exam  ROS  Blood pressure 123/83, pulse 103, height 5\' 3"  (1.6 m), weight 216 lb 12.8 oz (98.34 kg).Body mass index is 38.41 kg/(m^2).  General Appearance: Casual  Eye Contact::  Good  Speech:  Clear and Coherent  Volume:  Normal  Mood:  Euthymic  Affect:  NA  Thought Process:  Coherent  Orientation:  Full (Time, Place, and Person)  Thought Content:  WDL  Suicidal Thoughts:  No  Homicidal Thoughts:  No  Memory:  NA  Judgement:  Fair  Insight:  Fair  Psychomotor Activity:  Normal  Concentration:  Good  Recall:  Good  Fund of Knowledge:Good  Language: Good  Akathisia:  No  Handed:  Right  AIMS (if indicated):     Assets:  Desire for Improvement  Sleep:      Musculoskeletal: Strength & Muscle Tone:  Gait & Station:  Patient leans:   Current Medications: Current Outpatient Prescriptions  Medication Sig Dispense Refill  . albuterol (PROVENTIL HFA;VENTOLIN HFA) 108 (90 BASE) MCG/ACT inhaler Inhale 2 puffs into the lungs every 6 (six) hours as needed. For shortness of breath  1 Inhaler  3  . citalopram (CELEXA) 20 MG tablet Take 1 tablet (20 mg total) by mouth daily.  30 tablet  3  . clonazePAM (  KLONOPIN) 1 MG tablet 1 bid  60 tablet  2  . furosemide (LASIX) 20 MG tablet Take 10 mg by mouth daily as needed for edema.      Marland Kitchen. HYDROcodone-acetaminophen (NORCO/VICODIN) 5-325 MG per tablet Take 1 tablet by mouth every 6 (six) hours as needed.  14 tablet  0  . metoprolol succinate (TOPROL-XL) 50 MG 24 hr tablet Take 1 tablet (50 mg total) by mouth daily.  30 tablet  11  . nortriptyline (PAMELOR) 25 MG capsule 1 qhs for 1 week then 2 qhs afterwards  60 capsule  3  . traZODone (DESYREL) 100 MG tablet Take 100-200 mg by mouth at  bedtime.       No current facility-administered medications for this visit.    Lab Results: No results found for this or any previous visit (from the past 48 hour(s)).  Physical Findings: AIMS:  , ,  ,  ,    CIWA:    COWS:     Treatment Plan Summary: At this time we will make a number of medication changes. I will ask her to discontinue the Zoloft completely. She's only taking 50 mg. The patient will begin on Celexa taking 20 mg each morning. The patient will also begin on nortriptyline taking 25 mg at night for one week and then increase it to 25 mg 2 at night for a total of 50 mg. The patient go ahead and reduce her Klonopin from 3 a day down to one twice a day. She'll discontinue her trazodone. The patient will again attempt to get another appointment with Sylvia Hall. When the patient returns if she's not better I will actually increase her nortriptyline 200 mg and get a blood level. I'll attempt to continue to get rid of her Klonopin. It should be noted that she could not tolerate Neurontin she had an allergic reaction to it. The possibility of using Vistaril is a consideration. I do not believe this patient is dangerous to herself or anyone else.  Plan:  Medical Decision Making Problem Points:  Established problem, worsening (2) Data Points:  Review of medication regiment & side effects (2)  I certify that inpatient services furnished can reasonably be expected to improve the patient's condition.   Sylvia Hall 06/27/2013, 3:22 PM

## 2013-07-27 ENCOUNTER — Other Ambulatory Visit: Payer: Self-pay

## 2013-07-27 DIAGNOSIS — I341 Nonrheumatic mitral (valve) prolapse: Secondary | ICD-10-CM

## 2013-07-27 MED ORDER — METOPROLOL SUCCINATE ER 50 MG PO TB24: 50.0000 mg | ORAL_TABLET | Freq: Every day | ORAL | Status: DC

## 2013-08-05 ENCOUNTER — Emergency Department (HOSPITAL_COMMUNITY)
Admission: EM | Admit: 2013-08-05 | Discharge: 2013-08-05 | Disposition: A | Discharge status: Home / Self Care | Payer: PRIVATE HEALTH INSURANCE | Attending: Emergency Medicine | Admitting: Emergency Medicine

## 2013-08-05 ENCOUNTER — Encounter (HOSPITAL_COMMUNITY): Payer: Self-pay | Admitting: Emergency Medicine

## 2013-08-05 DIAGNOSIS — G8929 Other chronic pain: Secondary | ICD-10-CM | POA: Insufficient documentation

## 2013-08-05 DIAGNOSIS — Z8739 Personal history of other diseases of the musculoskeletal system and connective tissue: Secondary | ICD-10-CM | POA: Insufficient documentation

## 2013-08-05 DIAGNOSIS — R221 Localized swelling, mass and lump, neck: Secondary | ICD-10-CM

## 2013-08-05 DIAGNOSIS — Z79899 Other long term (current) drug therapy: Secondary | ICD-10-CM | POA: Insufficient documentation

## 2013-08-05 DIAGNOSIS — F17200 Nicotine dependence, unspecified, uncomplicated: Secondary | ICD-10-CM | POA: Insufficient documentation

## 2013-08-05 DIAGNOSIS — G40909 Epilepsy, unspecified, not intractable, without status epilepticus: Secondary | ICD-10-CM | POA: Insufficient documentation

## 2013-08-05 DIAGNOSIS — R6883 Chills (without fever): Secondary | ICD-10-CM | POA: Insufficient documentation

## 2013-08-05 DIAGNOSIS — F329 Major depressive disorder, single episode, unspecified: Secondary | ICD-10-CM | POA: Insufficient documentation

## 2013-08-05 DIAGNOSIS — F411 Generalized anxiety disorder: Secondary | ICD-10-CM | POA: Insufficient documentation

## 2013-08-05 DIAGNOSIS — F3289 Other specified depressive episodes: Secondary | ICD-10-CM | POA: Insufficient documentation

## 2013-08-05 DIAGNOSIS — R220 Localized swelling, mass and lump, head: Secondary | ICD-10-CM | POA: Insufficient documentation

## 2013-08-05 DIAGNOSIS — I059 Rheumatic mitral valve disease, unspecified: Secondary | ICD-10-CM | POA: Insufficient documentation

## 2013-08-05 DIAGNOSIS — K044 Acute apical periodontitis of pulpal origin: Secondary | ICD-10-CM | POA: Insufficient documentation

## 2013-08-05 DIAGNOSIS — J45909 Unspecified asthma, uncomplicated: Secondary | ICD-10-CM | POA: Insufficient documentation

## 2013-08-05 DIAGNOSIS — K047 Periapical abscess without sinus: Secondary | ICD-10-CM

## 2013-08-05 MED ORDER — PENICILLIN V POTASSIUM 250 MG PO TABS
500.0000 mg | ORAL_TABLET | Freq: Once | ORAL | Status: AC
  Administered 2013-08-05: 500 mg via ORAL
  Filled 2013-08-05: qty 2

## 2013-08-05 MED ORDER — HYDROCODONE-ACETAMINOPHEN 5-325 MG PO TABS
1.0000 | ORAL_TABLET | Freq: Once | ORAL | Status: AC
  Administered 2013-08-05: 1 via ORAL
  Filled 2013-08-05: qty 1

## 2013-08-05 MED ORDER — HYDROCODONE-ACETAMINOPHEN 5-325 MG PO TABS: ORAL_TABLET | ORAL | Status: DC

## 2013-08-05 MED ORDER — PENICILLIN V POTASSIUM 500 MG PO TABS
500.0000 mg | ORAL_TABLET | Freq: Three times a day (TID) | ORAL | Status: DC
Start: 2013-08-05 — End: 2013-08-07

## 2013-08-05 NOTE — ED Provider Notes (Signed)
CSN: [id number]     Arrival date & time 08/05/13  1803 History  This chart was scribed for non-physician practitioner, Renne CriglerJoshua Eriko Economos, PA-C, working with Shon Batonourtney F Horton, MD by Shari HeritageAisha Amuda, ED Scribe. This patient was seen in room TR06C/TR06C and the patient's care was started at 6:34 PM.    Chief Complaint  Patient presents with  . Oral Swelling  . Dental Pain    The history is provided by the patient. No language interpreter was used.    HPI Comments: Sylvia DesanctisBarbara Gengler is a 53 y.o. female who presents to the Emergency Department complaining of intermittent, moderate front lower dental pain that began a couple of days ago. She has taken Tylenol for pain without relief. Today, she woke up with swelling to her lower lip and left cheek. She says she can taste "metallic" discharge from the raised area on her lip. There is associated chills. She denies fever, nausea, vomiting, sore throat, dysphagia, or shortness of breath. Patient [place] and [place] not have a dentist in the area. She is allergic to Levaquin and sulfa based medications.    Past Medical History  Diagnosis Date  . Asthma   . Back pain, chronic   . Thrombophlebitis   . Mitral valve prolapse   . Epilepsy   . Stenosis of lumbosacral spine     numbness L hand and L leg  . Pericarditis   . Depression   . PTSD (post-traumatic stress disorder)   . Anxiety   . Fatigue    Past Surgical History  Procedure Laterality Date  . Nose surgery      s/p trauma  . Appendectomy    . Cesarean section      x 2  . Ovary surgery    . Uterine fibroid surgery    . Breast lumpectomy    . Breast lumpectomy      Right breast  . Biopsy breast      Right breast x 10  . Rhinoplasty     Family History  Problem Relation Age of Onset  . Diabetes Mother   . Mitral valve prolapse Mother   . Asthma Mother   . CAD Mother [id number]  . Anxiety disorder Mother   . Mitral valve prolapse Sister   . Asthma Sister   . Mitral valve  prolapse Son   . Alcohol abuse Son   . Diabetes Maternal Uncle   . Diabetes Maternal Grandmother   . Mitral valve prolapse Sister   . CAD Son [id number]    No details available  . Alcohol abuse Father   . Alcohol abuse Brother    History  Substance Use Topics  . Smoking status: Current Every Day Smoker -- 1.00 packs/day for 12 years    Types: Cigarettes  . Smokeless tobacco: Not on file  . Alcohol Use: No   OB History   Grav Para Term Preterm Abortions TAB SAB Ect Mult Living   2 2 0 2      2     Review of Systems  Constitutional: Positive for chills. Negative for fever.  HENT: Positive for dental problem and facial swelling. Negative for ear pain, sore throat and trouble swallowing.   Respiratory: Negative for shortness of breath and stridor.   Gastrointestinal: Negative for nausea and vomiting.  Musculoskeletal: Negative for neck pain.  Skin: Negative for color change.  Neurological: Negative for headaches.   Allergies  Phenytoin sodium extended; Aspirin; Caffeine; Dilantin;  Food; Ivp dye; Levofloxacin; Sulfa drugs cross reactors; and Sodium nitrate  Home Medications   Prior to Admission medications   Medication Sig Start Date End Date Taking? Authorizing Provider  albuterol (PROVENTIL HFA;VENTOLIN HFA) 108 (90 BASE) MCG/ACT inhaler Inhale 2 puffs into the lungs every 6 (six) hours as needed. For shortness of breath 04/12/12 04/12/13  Leroy SeaPrashant K Singh, MD  citalopram (CELEXA) 20 MG tablet Take 1 tablet (20 mg total) by mouth daily. 06/27/13 06/27/14  Archer AsaGerald Plovsky, MD  clonazePAM (KLONOPIN) 1 MG tablet 1 bid 06/27/13   Archer AsaGerald Plovsky, MD  furosemide (LASIX) 20 MG tablet Take 10 mg by mouth daily as needed for edema.    Historical Provider, MD  HYDROcodone-acetaminophen (NORCO/VICODIN) 5-325 MG per tablet Take 1 tablet by mouth every 6 (six) hours as needed. 04/08/13   Arman FilterGail K Schulz, NP  metoprolol succinate (TOPROL-XL) 50 MG 24 hr tablet Take 1 tablet (50 mg total) by mouth daily.  07/27/13 07/27/14  Rollene RotundaJames Hochrein, MD  nortriptyline (PAMELOR) 25 MG capsule 1 qhs for 1 week then 2 qhs afterwards 06/27/13   Archer AsaGerald Plovsky, MD  traZODone (DESYREL) 100 MG tablet Take 100-200 mg by mouth at bedtime.    Historical Provider, MD   Triage Vitals: BP 130/80  Pulse 111  Temp(Src) 98.5 F (36.9 C) (Oral)  Resp 16  Ht 5\' 2"  (1.575 m)  Wt 214 lb 8 oz (97.297 kg)  BMI 39.22 kg/m2  SpO2 96%  Physical Exam  Nursing note and vitals reviewed. Constitutional: She appears well-developed and well-nourished. No distress.  HENT:  Head: Normocephalic and atraumatic.  Right Ear: Tympanic membrane, external ear and ear canal normal.  Left Ear: Tympanic membrane, external ear and ear canal normal.  Nose: Nose normal.  Mouth/Throat: Uvula is midline, oropharynx is clear and moist and mucous membranes are normal. No trismus in the jaw. Abnormal dentition. Dental caries present. No dental abscesses or uvula swelling. No tonsillar abscesses.  Patient with L mandibular tooth pain and tenderness to palpation in area of 2nd incision and premolar. Erythema of gums. Swelling of left anterior lower lip but I cannot feel a discreet abscess or fluctuance.   Eyes: Conjunctivae and EOM are normal.  Neck: Normal range of motion. Neck supple. No tracheal deviation present.  No neck swelling or Ludwig's angina  Cardiovascular: Normal rate.   Pulmonary/Chest: Effort normal. No respiratory distress.  Musculoskeletal: Normal range of motion.  Lymphadenopathy:    She has no cervical adenopathy.  Neurological: She is alert.  Skin: Skin is warm and dry.  Psychiatric: She has a normal mood and affect. Her behavior is normal.    ED Course  Procedures (including critical care time) DIAGNOSTIC STUDIES: Oxygen Saturation is 96% on room air, adequate by my interpretation.    COORDINATION OF CARE: 6:38 PM- Patient informed of current plan for treatment and evaluation and agrees with plan at this time.    Patient Vitals for the past 24 hrs:  BP Temp Temp src Pulse Resp SpO2 Height Weight  08/05/13 1811 130/80 mmHg 98.5 F (36.9 C) Oral 111 16 96 % 5\' 2"  (1.575 m) 214 lb 8 oz (97.297 kg)   Patient counseled to take prescribed medications as directed, return with worsening facial or neck swelling, and to follow-up with their dentist as soon as possible.   Patient counseled on use of narcotic pain medications. Counseled not to combine these medications with others containing tylenol. Urged not to drink alcohol, drive, or perform any other  activities that requires focus while taking these medications. The patient verbalizes understanding and agrees with the plan.  MDM   Final diagnoses:  Dental infection   Patient with toothache. No fever. Exam unconcerning for Ludwig's angina or other deep tissue infection in neck.   As there is gum swelling, erythema, and facial swelling, will treat with antibiotic and pain medicine. Urged patient to follow-up with dentist.    I personally performed the services described in this documentation, which was scribed in my presence. The recorded information has been reviewed and is accurate.    Renne Crigler, PA-C 08/05/13 [phone]

## 2013-08-05 NOTE — Discharge Instructions (Signed)
Please read and follow all provided instructions.  Your diagnoses today include:  1. Dental infection    The exam and treatment you received today has been provided on an emergency basis only. This is not a substitute for complete medical or dental care.  Tests performed today include:  Vital signs. See below for your results today.   Medications prescribed:   Penicillin - antibiotic  You have been prescribed an antibiotic medicine: take the entire course of medicine even if you are feeling better. Stopping early can cause the antibiotic not to work.   Vicodin (hydrocodone/acetaminophen) - narcotic pain medication  DO NOT drive or perform any activities that require you to be awake and alert because this medicine can make you drowsy. BE VERY CAREFUL not to take multiple medicines containing Tylenol (also called acetaminophen). Doing so can lead to an overdose which can damage your liver and cause liver failure and possibly death.  Take any prescribed medications only as directed.  Home care instructions:  Follow any educational materials contained in this packet.  Follow-up instructions: Please follow-up with your dentist for further evaluation of your symptoms.   Dental Assistance: See below for dental referrals  Return instructions:   Please return to the Emergency Department if you experience worsening symptoms.  Please return if you develop a fever, you develop more swelling in your face or neck, you have trouble breathing or swallowing food.  Please return if you have any other emergent concerns.  Additional Information:  Your vital signs today were: BP 130/80   Pulse 111   Temp(Src) 98.5 F (36.9 C) (Oral)   Resp 16   Ht 5\' 2"  (1.575 m)   Wt 214 lb 8 oz (97.297 kg)   BMI 39.22 kg/m2   SpO2 96% If your blood pressure (BP) was elevated above 135/85 this visit, please have this repeated by your doctor within one month. -------------- Dental Care: Organization          Address  Phone  Notes  [facility] [address], [place] [phone] Accepts children up to age 53 who are enrolled in [place] or [place] Health Choice; pregnant women with a Medicaid card; and children who have applied for Medicaid or [place] Health Choice, but were declined, whose parents can pay a reduced fee at time of service.  [facility] [place]  [address], [place] [phone] Accepts children up to age 53 who are enrolled in [place] or [place] Health Choice; pregnant women with a Medicaid card; and children who have applied for Medicaid or  Health Choice, but were declined, whose parents can pay a reduced fee at time of service.  [facility]  [address], [place] [phone] Patients are seen by appointment only. Walk-ins are not accepted. [facility] will see patients 53 years of age and older. Monday - Tuesday (8am-5pm) Most Wednesdays (8:30-5pm) $30 per visit, cash only  [facility]  [address], [facility] [phone] Patients are seen by appointment only. Walk-ins are not accepted. [facility] will see patients 53 years of age and older. One Wednesday Evening (Monthly: Volunteer Based).  $30 per visit, cash only  [facility]  [phone] for adults; Children under age 424, call Graduate Pediatric Dentistry at [phone]. Children aged 314-14, please call [phone] to request a pediatric  application.  Dental services are provided in all areas of dental care including fillings, crowns and bridges, complete and partial dentures, implants, gum treatment, root canals, and extractions. Preventive care is also provided. Treatment is provided to both adults and children. Patients are selected via a lottery and there is often a waiting list.   [facility] [address], [place]  [phone] [email or web address]   [facility] [address], [place], [place] [phone], Ext. [phone] Second and Fourth Thursday of each month, opens at 6:30 AM; Clinic ends at 9 AM.  Patients are seen on a first-come first-served basis, and a limited number are seen during each clinic.   [facility]  [address], [place], [place] [phone]   Eligibility Requirements You must have lived in [place], [place], or [place] for at least the last three months.   You cannot be eligible for state or federal sponsored [place]care insurance, including [employer], [place]icaid, or [employer]icare.   You generally cannot be eligible for healthcare insurance through your employer.    How to apply: Eligibility screenings are held every Tuesday and Wednesday afternoon from 1:00 pm until 4:00 pm. You do not need an appointment for the interview!  [facility] [address], [place], [place] [phone]   [facility]  [phone]   [facility]  [phone]   [facility]  [phone]

## 2013-08-05 NOTE — ED Notes (Signed)
Reports having pain to front lower tooth and woke up this am with swelling to lower lip. Airway intact.

## 2013-08-06 NOTE — ED Provider Notes (Signed)
Medical screening examination/treatment/procedure(s) were performed by non-physician practitioner and as supervising physician I was immediately available for consultation/collaboration.   EKG Interpretation None        Courtney F Horton, MD 08/06/13 1931 

## 2013-08-07 MED ORDER — CLINDAMYCIN HCL 150 MG PO CAPS: 150.0000 mg | ORAL_CAPSULE | Freq: Four times a day (QID) | ORAL | Status: DC

## 2013-08-31 ENCOUNTER — Ambulatory Visit (INDEPENDENT_AMBULATORY_CARE_PROVIDER_SITE_OTHER): Payer: PRIVATE HEALTH INSURANCE | Admitting: Psychiatry

## 2013-08-31 DIAGNOSIS — F331 Major depressive disorder, recurrent, moderate: Secondary | ICD-10-CM

## 2013-08-31 DIAGNOSIS — F603 Borderline personality disorder: Secondary | ICD-10-CM

## 2013-08-31 DIAGNOSIS — F332 Major depressive disorder, recurrent severe without psychotic features: Secondary | ICD-10-CM

## 2013-08-31 MED ORDER — HYDROXYZINE PAMOATE 25 MG PO CAPS: ORAL_CAPSULE | ORAL | Status: DC

## 2013-08-31 MED ORDER — NORTRIPTYLINE HCL 25 MG PO CAPS: ORAL_CAPSULE | ORAL | Status: DC

## 2013-08-31 MED ORDER — CITALOPRAM HYDROBROMIDE 20 MG PO TABS: 20.0000 mg | ORAL_TABLET | Freq: Every day | ORAL | Status: DC

## 2013-08-31 MED ORDER — CLONAZEPAM 0.5 MG PO TABS: ORAL_TABLET | ORAL | Status: DC

## 2013-08-31 NOTE — Progress Notes (Signed)
[facility] MD Progress Note  08/31/2013 10:22 AM Sylvia Hall  MRN:  [id number] Subjective:  Anxiety attacks Today the patient says that for the last 2 weeks she's been having significant anxiety. She does not know why. She denies any specific stressors. The patient does have chronic pain but it seems that that is better. The patient is not planning to have surgery. The patient did not speak about her appetite issues and her weight gain. Generally she seems to be sleeping and eating fairly well. The patient again did not follow instructions in regards to her her crippling. She was instructed to take 25 mg one in the morning for a week and then to increase it to 2. The patient claims that the bottle does not say that. According to the pharmacist and we spent 2 in the [employer] on the phone that exactly what it says it is exactly what the computer says. I suspect this patient has gotten confused. The patient continues taking Klonopin 1 mg twice a day and says that in the last week or 2 she's increased the dose because of her anxiety. Therefore she increased the dose without a physician's knowing it. The patient does little for enjoyment. She does have 2 dogs that she enjoys. The patient is not suicidal. She's done nothing in a distracted manner.  Diagnosis:   DSM5: Schizophrenia Disorders:   Obsessive-Compulsive Disorders:   Trauma-Stressor Disorders:   Substance/Addictive Disorders:   Depressive Disorders:  Major Depressive Disorder - Moderate (296.22) Total Time spent with patient: 30 minutes  Axis I: Major Depression, Recurrent severe  ADL's:  Intact  Sleep: Good  Appetite:  Good  Suicidal Ideation:  no Homicidal Ideation:  none AEB (as evidenced by):  Psychiatric Specialty Exam: Physical Exam  ROS  There were no vitals taken for this visit.There is no weight on file to calculate BMI.  General Appearance: Casual  Eye Contact::  Good  Speech:  Clear and Coherent  Volume:  Normal  Mood:   Euthymic  Affect:  Congruent  Thought Process:  Coherent  Orientation:  Full (Time, Place, and Person)  Thought Content:  WDL  Suicidal Thoughts:  No  Homicidal Thoughts:  No  Memory:  Negative  Judgement:  Good  Insight:  Good  Psychomotor Activity:  Normal  Concentration:  Fair  Recall:  Good  Fund of Knowledge:Good  Language: Good  Akathisia:  No  Handed:  Right  AIMS (if indicated):     Assets:  Desire for Improvement  Sleep:      Musculoskeletal: Strength & Muscle Tone:  Gait & Station:  Patient leans:   Current Medications: Current Outpatient Prescriptions  Medication Sig Dispense Refill  . albuterol (PROVENTIL HFA;VENTOLIN HFA) 108 (90 BASE) MCG/ACT inhaler Inhale 2 puffs into the lungs every 6 (six) hours as needed. For shortness of breath  1 Inhaler  3  . citalopram (CELEXA) 20 MG tablet Take 1 tablet (20 mg total) by mouth daily.  30 tablet  3  . clindamycin (CLEOCIN) 150 MG capsule Take 1 capsule (150 mg total) by mouth every 6 (six) hours.  28 capsule  0  . clonazePAM (KLONOPIN) 0.5 MG tablet 1 bid  60 tablet  3  . furosemide (LASIX) 20 MG tablet Take 10 mg by mouth daily as needed for edema.      Marland Kitchen HYDROcodone-acetaminophen (NORCO/VICODIN) 5-325 MG per tablet Take 1-2 tablets every 6 hours as needed for severe pain  10 tablet  0  . hydrOXYzine (VISTARIL)  25 MG capsule 1 bid and 2 q6hr PRN for  anxiety  200 capsule  5  . metoprolol succinate (TOPROL-XL) 50 MG 24 hr tablet Take 1 tablet (50 mg total) by mouth daily.  30 tablet  6  . nortriptyline (PAMELOR) 25 MG capsule 1 qhs for 1 week then 2 qhs afterwards  60 capsule  5  . traZODone (DESYREL) 100 MG tablet Take 100-200 mg by mouth at bedtime.       No current facility-administered medications for this visit.    Lab Results: No results found for this or any previous visit (from the past 48 hour(s)).  Physical Findings: AIMS:  , ,  ,  ,    CIWA:    COWS:     Treatment Plan Summary: This patient she'll  begin in psychotherapy with Victorino DikeJennifer brown in the next few weeks. I think this will be very important for this individual who seems to have significant surgery logical issues. She describes weeks of anxiety for reasons that are not clear. She missed use her Klonopin by taking more than prescribed. Today we're going to go ahead and reduce her Klonopin down to 0.5 mg twice a day which is half the previous dose. Or next visit we'll likely taper off this agent. The patient will begin taking Vistaril 25 mg twice a day and be able take Vistaril 25 mg 2 pills as much as 3 times a day. The patient will continue taking Celexa 20 mg and she will continue taking nortriptyline increase the dose to taking 50 mg at night. The patient also get a nortriptyline level this patient to return to see me in 7 weeks  Plan:  Medical Decision Making Problem Points:   Data Points:  Review of medication regiment & side effects (2)  I certify that inpatient services furnished can reasonably be expected to improve the patient's condition.   Apolonia Ellwood IRVING 08/31/2013, 10:22 AM

## 2013-09-13 ENCOUNTER — Ambulatory Visit (HOSPITAL_COMMUNITY): Payer: Self-pay | Admitting: Psychiatry

## 2013-10-24 ENCOUNTER — Ambulatory Visit (INDEPENDENT_AMBULATORY_CARE_PROVIDER_SITE_OTHER): Payer: PRIVATE HEALTH INSURANCE | Admitting: Psychiatry

## 2013-10-24 VITALS — BP 118/85 | HR 106 | Ht 62.0 in | Wt 218.4 lb

## 2013-10-24 DIAGNOSIS — F329 Major depressive disorder, single episode, unspecified: Secondary | ICD-10-CM

## 2013-10-24 DIAGNOSIS — F339 Major depressive disorder, recurrent, unspecified: Secondary | ICD-10-CM

## 2013-10-24 MED ORDER — CITALOPRAM HYDROBROMIDE 20 MG PO TABS: 20.0000 mg | ORAL_TABLET | Freq: Every day | ORAL | Status: DC

## 2013-10-24 MED ORDER — HYDROXYZINE PAMOATE 25 MG PO CAPS: ORAL_CAPSULE | ORAL | Status: DC

## 2013-10-24 MED ORDER — NORTRIPTYLINE HCL 25 MG PO CAPS: ORAL_CAPSULE | ORAL | Status: DC

## 2013-10-24 NOTE — Progress Notes (Signed)
[facility] MD Progress Note  10/24/2013 2:57 PM Sylvia Hall  MRN:  [id number] Subjective: Calm Today the patient seems to be doing fairly well. Her anxiety overall is less. Her pain is well managed. The patient says she benefits by taking Vistaril and is taking it 4 times a day. The patient no problems reducing her Klonopin down to 0.5 mg twice a day. The patient says that since being on the nortriptyline she actually feels much better in terms of sleep. She's eating well. Her mood is good. She's not suicidal. The patient uses no drugs drinks no alcohol and is stable. Today she seen alone. Overall she seems to be more even. Diagnosis:   DSM5: Schizophrenia Disorders:   Obsessive-Compulsive Disorders:   Trauma-Stressor Disorders:   Substance/Addictive Disorders:   Depressive Disorders:   Total Time spent with patient:   Axis I: Major Depression, single episode  ADL's:  Intact  Sleep: Good  Appetite:  Good  Suicidal Ideation:  no Homicidal Ideation:  none AEB (as evidenced by):  Psychiatric Specialty Exam: Physical Exam  ROS  Blood pressure 118/85, pulse 106, height  (1.575 m), weight 218 lb 6.4 oz (99.066 kg).Body mass index is 39.94 kg/(m^2).  General Appearance: Casual  Eye Contact::  Good  Speech:  Clear and Coherent  Volume:  Normal  Mood:  Euthymic  Affect:  Congruent  Thought Process:  Coherent  Orientation:  Full (Time, Place, and Person)  Thought Content:  WDL  Suicidal Thoughts:  No  Homicidal Thoughts:  No  Memory:  NA  Judgement:  Good  Insight:  Fair  Psychomotor Activity:  Normal  Concentration:  Good  Recall:  Fair  Fund of Knowledge:Good  Language: Good  Akathisia:  No  Handed:  Right  AIMS (if indicated):     Assets:  Communication Skills  Sleep:      Musculoskeletal: Strength & Muscle Tone:  Gait & Station:  Patient leans:   Current Medications: Current Outpatient Prescriptions  Medication Sig Dispense Refill  . albuterol (PROVENTIL  HFA;VENTOLIN HFA) 108 (90 BASE) MCG/ACT inhaler Inhale 2 puffs into the lungs every 6 (six) hours as needed. For shortness of breath  1 Inhaler  3  . citalopram (CELEXA) 20 MG tablet Take 1 tablet (20 mg total) by mouth daily.  30 tablet  3  . clindamycin (CLEOCIN) 150 MG capsule Take 1 capsule (150 mg total) by mouth every 6 (six) hours.  28 capsule  0  . clonazePAM (KLONOPIN) 0.5 MG tablet 1 bid  60 tablet  3  . furosemide (LASIX) 20 MG tablet Take 10 mg by mouth daily as needed for edema.      Marland Kitchen HYDROcodone-acetaminophen (NORCO/VICODIN) 5-325 MG per tablet Take 1-2 tablets every 6 hours as needed for severe pain  10 tablet  0  . hydrOXYzine (VISTARIL) 25 MG capsule 1 bid and 2 q6hr PRN for  anxiety  200 capsule  5  . metoprolol succinate (TOPROL-XL) 50 MG 24 hr tablet Take 1 tablet (50 mg total) by mouth daily.  30 tablet  6  . nortriptyline (PAMELOR) 25 MG capsule 1 qhs for 1 week then 2 qhs afterwards  60 capsule  5  . traZODone (DESYREL) 100 MG tablet Take 100-200 mg by mouth at bedtime.       No current facility-administered medications for this visit.    Lab Results: No results found for this or any previous visit (from the past 48 hour(s)).  Physical Findings: AIMS:  , ,  ,  ,  CIWA:    COWS:     Treatment Plan Summary: At this time I believe this patient is doing quite well. We will go ahead and discontinue her Klonopin. She'll reduce to taking one at night for about a week and discontinue it. The patient will continue taking 50 mg of nortriptyline at night. There was no level drawn but I don't think it's necessary at this point. We will increase her Vistaril to taking 25 mg 2 3 times a day and to take 1 or 2 extra every day as needed. The patient will continue taking Celexa. The patient has an upcoming appointment next month with [name]. She'll return to see me in 3 months.  Plan:  Medical Decision Making Problem Points:  Established problem, stable/improving (1) Data  Points:  Review of medication regiment & side effects (2)  I certify that inpatient services furnished can reasonably be expected to improve the patient's condition.   [name] 10/24/2013, 2:57 PM

## 2013-10-24 NOTE — Addendum Note (Signed)
Addended by: Archer Asa on: 10/24/2013 03:21 PM   Modules accepted: Orders, Medications, Level of Service

## 2013-11-22 ENCOUNTER — Other Ambulatory Visit (HOSPITAL_COMMUNITY): Payer: Self-pay | Admitting: Psychiatry

## 2013-11-23 NOTE — Telephone Encounter (Signed)
Chart reviewed, refill not appropriate. Filled 9/16 with 5 refills

## 2013-11-25 ENCOUNTER — Encounter (HOSPITAL_COMMUNITY): Payer: Self-pay | Admitting: Emergency Medicine

## 2013-11-25 ENCOUNTER — Emergency Department (HOSPITAL_COMMUNITY)
Admission: EM | Admit: 2013-11-25 | Discharge: 2013-11-26 | Disposition: A | Discharge status: Home / Self Care | Payer: Self-pay | Attending: Emergency Medicine | Admitting: Emergency Medicine

## 2013-11-25 DIAGNOSIS — M549 Dorsalgia, unspecified: Secondary | ICD-10-CM | POA: Insufficient documentation

## 2013-11-25 DIAGNOSIS — R1011 Right upper quadrant pain: Secondary | ICD-10-CM | POA: Insufficient documentation

## 2013-11-25 DIAGNOSIS — F329 Major depressive disorder, single episode, unspecified: Secondary | ICD-10-CM | POA: Insufficient documentation

## 2013-11-25 DIAGNOSIS — Z79899 Other long term (current) drug therapy: Secondary | ICD-10-CM | POA: Insufficient documentation

## 2013-11-25 DIAGNOSIS — R112 Nausea with vomiting, unspecified: Secondary | ICD-10-CM | POA: Insufficient documentation

## 2013-11-25 DIAGNOSIS — F419 Anxiety disorder, unspecified: Secondary | ICD-10-CM | POA: Insufficient documentation

## 2013-11-25 DIAGNOSIS — Z8679 Personal history of other diseases of the circulatory system: Secondary | ICD-10-CM | POA: Insufficient documentation

## 2013-11-25 DIAGNOSIS — J45909 Unspecified asthma, uncomplicated: Secondary | ICD-10-CM | POA: Insufficient documentation

## 2013-11-25 DIAGNOSIS — R1010 Upper abdominal pain, unspecified: Secondary | ICD-10-CM

## 2013-11-25 DIAGNOSIS — G8929 Other chronic pain: Secondary | ICD-10-CM | POA: Insufficient documentation

## 2013-11-25 DIAGNOSIS — Z720 Tobacco use: Secondary | ICD-10-CM | POA: Insufficient documentation

## 2013-11-25 DIAGNOSIS — Z8669 Personal history of other diseases of the nervous system and sense organs: Secondary | ICD-10-CM | POA: Insufficient documentation

## 2013-11-25 DIAGNOSIS — Z880 Allergy status to penicillin: Secondary | ICD-10-CM | POA: Insufficient documentation

## 2013-11-25 DIAGNOSIS — R748 Abnormal levels of other serum enzymes: Secondary | ICD-10-CM | POA: Insufficient documentation

## 2013-11-25 LAB — COMPREHENSIVE METABOLIC PANEL
ALT: 122 U/L — ABNORMAL HIGH (ref 0–35)
AST: 87 U/L — ABNORMAL HIGH (ref 0–37)
Albumin: 4.5 g/dL (ref 3.5–5.2)
Alkaline Phosphatase: 113 U/L (ref 39–117)
Anion gap: 15 (ref 5–15)
BUN: 13 mg/dL (ref 6–23)
CALCIUM: 9.5 mg/dL (ref 8.4–10.5)
CO2: 24 meq/L (ref 19–32)
CREATININE: 0.98 mg/dL (ref 0.50–1.10)
Chloride: 98 mEq/L (ref 96–112)
GFR, EST AFRICAN AMERICAN: 75 mL/min — AB (ref >90)
GFR, EST NON AFRICAN AMERICAN: 65 mL/min — AB (ref >90)
GLUCOSE: 107 mg/dL — AB (ref 70–99)
Potassium: 4 mEq/L (ref 3.7–5.3)
Sodium: 137 mEq/L (ref 137–147)
TOTAL PROTEIN: 8 g/dL (ref 6.0–8.3)
Total Bilirubin: 0.5 mg/dL (ref 0.3–1.2)

## 2013-11-25 LAB — CBC WITH DIFFERENTIAL/PLATELET
Basophils Absolute: 0 10*3/uL (ref 0.0–0.1)
Basophils Relative: 1 % (ref 0–1)
EOS ABS: 0 10*3/uL (ref 0.0–0.7)
Eosinophils Relative: 1 % (ref 0–5)
HEMATOCRIT: 43.7 % (ref 36.0–46.0)
Hemoglobin: 15.1 g/dL — ABNORMAL HIGH (ref 12.0–15.0)
LYMPHS ABS: 1.4 10*3/uL (ref 0.7–4.0)
LYMPHS PCT: 27 % (ref 12–46)
MCH: 32.5 pg (ref 26.0–34.0)
MCHC: 34.6 g/dL (ref 30.0–36.0)
MCV: 94 fL (ref 78.0–100.0)
MONO ABS: 0.5 10*3/uL (ref 0.1–1.0)
Monocytes Relative: 10 % (ref 3–12)
Neutro Abs: 3.3 10*3/uL (ref 1.7–7.7)
Neutrophils Relative %: 61 % (ref 43–77)
Platelets: 168 10*3/uL (ref 150–400)
RBC: 4.65 MIL/uL (ref 3.87–5.11)
RDW: 12 % (ref 11.5–15.5)
WBC: 5.3 10*3/uL (ref 4.0–10.5)

## 2013-11-25 LAB — URINALYSIS, ROUTINE W REFLEX MICROSCOPIC
Glucose, UA: NEGATIVE mg/dL
Hgb urine dipstick: NEGATIVE
Ketones, ur: NEGATIVE mg/dL
LEUKOCYTES UA: NEGATIVE
NITRITE: NEGATIVE
PROTEIN: NEGATIVE mg/dL
Specific Gravity, Urine: 1.027 (ref 1.005–1.030)
Urobilinogen, UA: 0.2 mg/dL (ref 0.0–1.0)
pH: 5.5 (ref 5.0–8.0)

## 2013-11-25 LAB — LIPASE, BLOOD: LIPASE: 28 U/L (ref 11–59)

## 2013-11-25 MED ORDER — PANTOPRAZOLE SODIUM 40 MG PO TBEC
40.0000 mg | DELAYED_RELEASE_TABLET | Freq: Once | ORAL | Status: AC
  Administered 2013-11-25: 40 mg via ORAL
  Filled 2013-11-25: qty 1

## 2013-11-25 MED ORDER — ONDANSETRON HCL 4 MG/2ML IJ SOLN
4.0000 mg | Freq: Once | INTRAMUSCULAR | Status: AC
  Administered 2013-11-25: 4 mg via INTRAVENOUS
  Filled 2013-11-25: qty 2

## 2013-11-25 MED ORDER — SODIUM CHLORIDE 0.9 % IV BOLUS (SEPSIS)
1000.0000 mL | Freq: Once | INTRAVENOUS | Status: AC
  Administered 2013-11-25: 1000 mL via INTRAVENOUS

## 2013-11-25 MED ORDER — OXYCODONE-ACETAMINOPHEN 5-325 MG PO TABS
1.0000 | ORAL_TABLET | Freq: Once | ORAL | Status: AC
  Administered 2013-11-25: 1 via ORAL
  Filled 2013-11-25: qty 1

## 2013-11-25 MED ORDER — MORPHINE SULFATE 4 MG/ML IJ SOLN
4.0000 mg | Freq: Once | INTRAMUSCULAR | Status: AC
  Administered 2013-11-25: 4 mg via INTRAVENOUS
  Filled 2013-11-25: qty 1

## 2013-11-25 NOTE — ED Notes (Signed)
Pt reports sharp pain in upper rt quadrant that started 2wks ago that is worse when she eats. Pt does not have her appendix but states she still has her gallbladder. Pt rates pain 10/10 after pain medication. Pt is vomiting bile and states she has heartburn.

## 2013-11-25 NOTE — ED Notes (Signed)
Pt c/o RUQ pain for the past two weeks. Pt reports increase pain with every meal. Abdomen is tender to palpation and firm. Pt reports n/v, denies diarrhea, constipation. Last BM two days ago, pt reports normal time frame with normal BM.

## 2013-11-25 NOTE — ED Provider Notes (Signed)
CSN: [id number]     Arrival date & time 11/25/13  1831 History   First MD Initiated Contact with Patient 11/25/13 2301     Chief Complaint  Patient presents with  . Abdominal Pain     (Consider location/radiation/quality/duration/timing/severity/associated sxs/prior Treatment) HPI Patient presents with right upper quadrant abdominal pain for the past 10 days. She states it has been been episodic and is worse after eating. Pain radiates to the right side of her back. Patient states that has lasted only several hours and then abates. She's had associated nausea with vomiting. She denies any blood in the vomit she states she woke this morning with right upper quadrant pain which is constant for the past 18 hours. It is worse with deep breathing and movement. Denies any chest pain. Last bowel movement was 2 days ago. She says she's been unable to pass gas since. She's had no fever or chills. Patient has a history of multiple abdominal surgeries including appendectomy and multiple gynecologic surgeries. Past Medical History  Diagnosis Date  . Asthma   . Back pain, chronic   . Thrombophlebitis   . Mitral valve prolapse   . Epilepsy   . Stenosis of lumbosacral spine     numbness L hand and L leg  . Pericarditis   . Depression   . PTSD (post-traumatic stress disorder)   . Anxiety   . Fatigue    Past Surgical History  Procedure Laterality Date  . Nose surgery      s/p trauma  . Appendectomy    . Cesarean section      x 2  . Ovary surgery    . Uterine fibroid surgery    . Breast lumpectomy    . Breast lumpectomy      Right breast  . Biopsy breast      Right breast x 10  . Rhinoplasty     Family History  Problem Relation Age of Onset  . Diabetes Mother   . Mitral valve prolapse Mother   . Asthma Mother   . CAD Mother 30  . Anxiety disorder Mother   . Mitral valve prolapse Sister   . Asthma Sister   . Mitral valve prolapse Son   . Alcohol abuse Son   . Diabetes Maternal  Uncle   . Diabetes Maternal Grandmother   . Mitral valve prolapse Sister   . CAD Son 65    No details available  . Alcohol abuse Father   . Alcohol abuse Brother    History  Substance Use Topics  . Smoking status: Current Every Day Smoker -- 1.00 packs/day for 12 years    Types: Cigarettes  . Smokeless tobacco: Not on file  . Alcohol Use: No   OB History   Grav Para Term Preterm Abortions TAB SAB Ect Mult Living   2 2 0 2      2     Review of Systems  Constitutional: Negative for fever and chills.  Respiratory: Negative for cough and shortness of breath.   Cardiovascular: Negative for chest pain.  Gastrointestinal: Positive for nausea, vomiting and abdominal pain. Negative for diarrhea, constipation and blood in stool.  Genitourinary: Negative for dysuria, hematuria and flank pain.  Musculoskeletal: Positive for back pain. Negative for neck pain and neck stiffness.  Skin: Negative for rash and wound.  Neurological: Negative for dizziness, weakness, light-headedness, numbness and headaches.  All other systems reviewed and are negative.     Allergies  Penicillins; Phenytoin sodium  extended; Aspirin; Caffeine; Dilantin; Food; Ivp dye; Levofloxacin; Sulfa drugs cross reactors; and Sodium nitrate  Home Medications   Prior to Admission medications   Medication Sig Start Date End Date Taking? Authorizing Provider  albuterol (PROVENTIL HFA;VENTOLIN HFA) 108 (90 BASE) MCG/ACT inhaler Inhale 2 puffs into the lungs every 6 (six) hours as needed. For shortness of breath 04/12/12 11/26/13 Yes Leroy SeaPrashant K Singh, MD  citalopram (CELEXA) 20 MG tablet Take 1 tablet (20 mg total) by mouth daily. 10/24/13 10/24/14 Yes Archer AsaGerald Plovsky, MD  furosemide (LASIX) 20 MG tablet Take 10 mg by mouth daily as needed for edema.   Yes Historical Provider, MD  hydrOXYzine (ATARAX/VISTARIL) 25 MG tablet Take 50 mg by mouth every 4 (four) hours as needed for anxiety.   Yes Historical Provider, MD  metoprolol  succinate (TOPROL-XL) 50 MG 24 hr tablet Take 1 tablet (50 mg total) by mouth daily. 07/27/13 07/27/14 Yes Rollene RotundaJames Hochrein, MD  nortriptyline (PAMELOR) 25 MG capsule 2  qhs 10/24/13  Yes Archer AsaGerald Plovsky, MD  lansoprazole (PREVACID) 30 MG capsule Take 1 capsule (30 mg total) by mouth daily at 12 noon. 11/26/13   Loren Raceravid Nnaemeka Samson, MD  metoCLOPramide (REGLAN) 10 MG tablet Take 1 tablet (10 mg total) by mouth every 6 (six) hours as needed for nausea or vomiting (nausea/headache). 11/26/13   Loren Raceravid Brodin Gelpi, MD  traMADol (ULTRAM) 50 MG tablet Take 1 tablet (50 mg total) by mouth every 6 (six) hours as needed for moderate pain or severe pain. 11/26/13   Loren Raceravid Patty Lopezgarcia, MD   BP 97/51  Pulse 92  Temp(Src) 97.8 F (36.6 C) (Oral)  Resp 16  Ht 5\' 3"  (1.6 m)  SpO2 96% Physical Exam  Nursing note and vitals reviewed. Constitutional: She is oriented to person, place, and time. She appears well-developed and well-nourished. No distress.  HENT:  Head: Normocephalic and atraumatic.  Mouth/Throat: Oropharynx is clear and moist.  Eyes: EOM are normal. Pupils are equal, round, and reactive to light.  Neck: Normal range of motion. Neck supple.  Cardiovascular: Normal rate and regular rhythm.   Pulmonary/Chest: Effort normal and breath sounds normal. No respiratory distress. She has no wheezes. She has no rales. She exhibits no tenderness.  Abdominal: Soft. Bowel sounds are normal. She exhibits distension (mild generalized distention). She exhibits no mass. There is tenderness (very tender to palpation in the right quadrant. Voluntary guarding.). There is guarding. There is no rebound.  Musculoskeletal: Normal range of motion. She exhibits no edema and no tenderness.  No CVA tenderness. No calf swelling or tenderness  Neurological: She is alert and oriented to person, place, and time.  Results from this without deficit. Sensation is grossly intact.  Skin: Skin is warm and dry. No rash noted. No erythema.   Psychiatric: She has a normal mood and affect. Her behavior is normal.    ED Course  Procedures (including critical care time) Labs Review Labs Reviewed  CBC WITH DIFFERENTIAL - Abnormal; Notable for the following:    Hemoglobin 15.1 (*)    All other components within normal limits  COMPREHENSIVE METABOLIC PANEL - Abnormal; Notable for the following:    Glucose, Bld 107 (*)    AST 87 (*)    ALT 122 (*)    GFR calc non Af Amer 65 (*)    GFR calc Af Amer 75 (*)    All other components within normal limits  URINALYSIS, ROUTINE W REFLEX MICROSCOPIC - Abnormal; Notable for the following:    Bilirubin Urine  SMALL (*)    All other components within normal limits  LIPASE, BLOOD    Imaging Review Ct Abdomen Pelvis Wo Contrast  11/26/2013   CLINICAL DATA:  Abdominal pain.  Allergic to IV contrast material.  EXAM: CT ABDOMEN AND PELVIS WITHOUT CONTRAST  TECHNIQUE: Multidetector CT imaging of the abdomen and pelvis was performed following the standard protocol without IV contrast.  COMPARISON:  Ultrasound abdomen 11/1913.  FINDINGS: Dependent changes in the lung bases.  Diffuse fatty infiltration of the liver. There is a focal circumscribed low-attenuation lesion centrally in the liver measuring 1.5 cm diameter. This is indeterminate, but most likely to represent a cyst or hemangioma. Definitive characterization is not possible without IV contrast material. Recommend follow-up CT or MRI in 6 months. The unenhanced appearance of the gallbladder, pancreas, spleen, adrenal glands, kidneys, abdominal aorta, inferior vena cava, and retroperitoneal lymph nodes is unremarkable. Stomach and small bowel are normal in appearance. Scattered stool in the colon without distention. No free air or free fluid in the abdomen.  Pelvis: Uterus and ovaries are not enlarged. Bladder wall is not thickened. No free or loculated pelvic fluid collections. No pelvic mass or lymphadenopathy. Surgical absence of the appendix.  Degenerative changes in the lumbar spine. No destructive bone lesions appreciated.  IMPRESSION: No acute process demonstrated in the abdomen or pelvis. 1.5 cm diameter indeterminate liver lesion. Recommend six-month follow-up with CT or MRI. Diffuse fatty infiltration of the liver.   Electronically Signed   By: Burman NievesWilliam  Stevens M.D.   On: 11/26/2013 04:08   [place] Abdomen Complete  11/26/2013   CLINICAL DATA:  53 year old female with acute right upper quadrant pain, with abdominal swelling bloating and reflux. Initial encounter.  EXAM: ULTRASOUND ABDOMEN COMPLETE  COMPARISON:  None.  FINDINGS: Gallbladder: No gallstones or wall thickening visualized. No sonographic Murphy sign noted.  Common bile duct: Diameter: 6 -7 mm, upper limits of normal.  Liver: Diffusely increased echogenicity. Small 17 mm hypoechoic area posterior to the gallbladder fossa most resembles a benign cyst (may have a single internal septation). No intrahepatic biliary ductal dilatation or other discrete hepatic lesion.  IVC: Not visualized due to overlying bowel gas.  Pancreas: Not visualized due to overlying bowel gas.  Spleen: Size and appearance within normal limits.  Right Kidney: Length: 9.7 cm. Echogenicity within normal limits. No mass or hydronephrosis visualized.  Left Kidney: Length: 9.8 cm. Echogenicity within normal limits. No mass or hydronephrosis visualized.  Abdominal aorta: Incompletely visualized due to overlying bowel gas, visualized portions within normal limits.  Other findings: None.  IMPRESSION: 1. Hepatic steatosis. 2. Otherwise negative abdominal ultrasound (some viscera not well visualized due to overlying bowel gas).   Electronically Signed   By: Augusto GambleLee  Hall M.D.   On: 11/26/2013 01:25     EKG Interpretation None      MDM   Final diagnoses:  Pain of upper abdomen  Elevated liver enzymes    Patient's abdominal pain is improved. Both ultrasound and CT without acute findings. Patient does have mildly elevated  liver enzymes. She denies recent alcohol intake or excessive use of Tylenol. Suspect gastritis or gastric emptying delay. We'll treat with PPI and Reglan. Patient been given return precautions and followup with gastroenterology.    Loren Raceravid Yosgar Demirjian, MD 11/26/13 [id number]

## 2013-11-26 ENCOUNTER — Emergency Department (HOSPITAL_COMMUNITY): Payer: PRIVATE HEALTH INSURANCE

## 2013-11-26 ENCOUNTER — Encounter (HOSPITAL_COMMUNITY): Payer: Self-pay | Admitting: Radiology

## 2013-11-26 ENCOUNTER — Emergency Department (HOSPITAL_COMMUNITY): Payer: Self-pay

## 2013-11-26 MED ORDER — METOCLOPRAMIDE HCL 5 MG/ML IJ SOLN
10.0000 mg | Freq: Once | INTRAMUSCULAR | Status: AC
  Administered 2013-11-26: 10 mg via INTRAVENOUS
  Filled 2013-11-26: qty 2

## 2013-11-26 MED ORDER — TRAMADOL HCL 50 MG PO TABS: 50.0000 mg | ORAL_TABLET | Freq: Four times a day (QID) | ORAL | Status: DC | PRN

## 2013-11-26 MED ORDER — METOCLOPRAMIDE HCL 10 MG PO TABS: 10.0000 mg | ORAL_TABLET | Freq: Four times a day (QID) | ORAL | Status: DC | PRN

## 2013-11-26 MED ORDER — LANSOPRAZOLE 30 MG PO CPDR: 30.0000 mg | DELAYED_RELEASE_CAPSULE | Freq: Every day | ORAL | Status: DC

## 2013-11-26 MED ORDER — MORPHINE SULFATE 4 MG/ML IJ SOLN
4.0000 mg | Freq: Once | INTRAMUSCULAR | Status: AC
  Administered 2013-11-26: 4 mg via INTRAVENOUS
  Filled 2013-11-26: qty 1

## 2013-11-26 MED ORDER — GI COCKTAIL ~~LOC~~
30.0000 mL | Freq: Once | ORAL | Status: AC
  Administered 2013-11-26: 30 mL via ORAL

## 2013-11-26 NOTE — ED Notes (Signed)
Pt and pt's husband verbalized understanding of d/c instructions and directions for follow-up care. Pt voices no additional needs, concerns, or questions.

## 2013-11-26 NOTE — Discharge Instructions (Signed)
Abdominal Pain, Women °Abdominal (stomach, pelvic, or belly) pain can be caused by many things. It is important to tell your doctor: °· The location of the pain. °· Does it come and go or is it present all the time? °· Are there things that start the pain (eating certain foods, exercise)? °· Are there other symptoms associated with the pain (fever, nausea, vomiting, diarrhea)? °All of this is helpful to know when trying to find the cause of the pain. °CAUSES  °· Stomach: virus or bacteria infection, or ulcer. °· Intestine: appendicitis (inflamed appendix), regional ileitis (Crohn's disease), ulcerative colitis (inflamed colon), irritable bowel syndrome, diverticulitis (inflamed diverticulum of the colon), or cancer of the stomach or intestine. °· Gallbladder disease or stones in the gallbladder. °· Kidney disease, kidney stones, or infection. °· Pancreas infection or cancer. °· Fibromyalgia (pain disorder). °· Diseases of the female organs: °¨ Uterus: fibroid (non-cancerous) tumors or infection. °¨ Fallopian tubes: infection or tubal pregnancy. °¨ Ovary: cysts or tumors. °¨ Pelvic adhesions (scar tissue). °¨ Endometriosis (uterus lining tissue growing in the pelvis and on the pelvic organs). °¨ Pelvic congestion syndrome (female organs filling up with blood just before the menstrual period). °¨ Pain with the menstrual period. °¨ Pain with ovulation (producing an egg). °¨ Pain with an IUD (intrauterine device, birth control) in the uterus. °¨ Cancer of the female organs. °· Functional pain (pain not caused by a disease, may improve without treatment). °· Psychological pain. °· Depression. °DIAGNOSIS  °Your doctor will decide the seriousness of your pain by doing an examination. °· Blood tests. °· X-rays. °· Ultrasound. °· CT scan (computed tomography, special type of X-ray). °· MRI (magnetic resonance imaging). °· Cultures, for infection. °· Barium enema (dye inserted in the large intestine, to better view it with  X-rays). °· Colonoscopy (looking in intestine with a lighted tube). °· Laparoscopy (minor surgery, looking in abdomen with a lighted tube). °· Major abdominal exploratory surgery (looking in abdomen with a large incision). °TREATMENT  °The treatment will depend on the cause of the pain.  °· Many cases can be observed and treated at home. °· Over-the-counter medicines recommended by your caregiver. °· Prescription medicine. °· Antibiotics, for infection. °· Birth control pills, for painful periods or for ovulation pain. °· Hormone treatment, for endometriosis. °· Nerve blocking injections. °· Physical therapy. °· Antidepressants. °· Counseling with a psychologist or psychiatrist. °· Minor or major surgery. °HOME CARE INSTRUCTIONS  °· Do not take laxatives, unless directed by your caregiver. °· Take over-the-counter pain medicine only if ordered by your caregiver. Do not take aspirin because it can cause an upset stomach or bleeding. °· Try a clear liquid diet (broth or water) as ordered by your caregiver. Slowly move to a bland diet, as tolerated, if the pain is related to the stomach or intestine. °· Have a thermometer and take your temperature several times a day, and record it. °· Bed rest and sleep, if it helps the pain. °· Avoid sexual intercourse, if it causes pain. °· Avoid stressful situations. °· Keep your follow-up appointments and tests, as your caregiver orders. °· If the pain does not go away with medicine or surgery, you may try: °¨ Acupuncture. °¨ Relaxation exercises (yoga, meditation). °¨ Group therapy. °¨ Counseling. °SEEK MEDICAL CARE IF:  °· You notice certain foods cause stomach pain. °· Your home care treatment is not helping your pain. °· You need stronger pain medicine. °· You want your IUD removed. °· You feel faint or   lightheaded. °· You develop nausea and vomiting. °· You develop a rash. °· You are having side effects or an allergy to your medicine. °SEEK IMMEDIATE MEDICAL CARE IF:  °· Your  pain does not go away or gets worse. °· You have a fever. °· Your pain is felt only in portions of the abdomen. The right side could possibly be appendicitis. The left lower portion of the abdomen could be colitis or diverticulitis. °· You are passing blood in your stools (bright red or black tarry stools, with or without vomiting). °· You have blood in your urine. °· You develop chills, with or without a fever. °· You pass out. °MAKE SURE YOU:  °· Understand these instructions. °· Will watch your condition. °· Will get help right away if you are not doing well or get worse. °Document Released: 11/22/2006 Document Revised: 06/11/2013 Document Reviewed: 12/12/2008 °[employer]® Patient Information ©2015 [employer]. This information is not intended to replace advice given to you by your health care provider. Make sure you discuss any questions you have with your health care provider. ° °Gastritis, Adult °Gastritis is soreness and swelling (inflammation) of the lining of the stomach. Gastritis can develop as a sudden onset (acute) or long-term (chronic) condition. If gastritis is not treated, it can lead to stomach bleeding and ulcers. °CAUSES  °Gastritis occurs when the stomach lining is weak or damaged. Digestive juices from the stomach then inflame the weakened stomach lining. The stomach lining may be weak or damaged due to viral or bacterial infections. One common bacterial infection is the Helicobacter pylori infection. Gastritis can also result from excessive alcohol consumption, taking certain medicines, or having too much acid in the stomach.  °SYMPTOMS  °In some cases, there are no symptoms. When symptoms are present, they may include: °· Pain or a burning sensation in the upper abdomen. °· Nausea. °· Vomiting. °· An uncomfortable feeling of fullness after eating. °DIAGNOSIS  °Your caregiver may suspect you have gastritis based on your symptoms and a physical exam. To determine the cause of your gastritis,  your caregiver may perform the following: °· Blood or stool tests to check for the H pylori bacterium. °· Gastroscopy. A thin, flexible tube (endoscope) is passed down the esophagus and into the stomach. The endoscope has a light and camera on the end. Your caregiver uses the endoscope to view the inside of the stomach. °· Taking a tissue sample (biopsy) from the stomach to examine under a microscope. °TREATMENT  °Depending on the cause of your gastritis, medicines may be prescribed. If you have a bacterial infection, such as an H pylori infection, antibiotics may be given. If your gastritis is caused by too much acid in the stomach, H2 blockers or antacids may be given. Your caregiver may recommend that you stop taking aspirin, ibuprofen, or other nonsteroidal anti-inflammatory drugs (NSAIDs). °HOME CARE INSTRUCTIONS °· Only take over-the-counter or prescription medicines as directed by your caregiver. °· If you were given antibiotic medicines, take them as directed. Finish them even if you start to feel better. °· Drink enough fluids to keep your urine clear or pale yellow. °· Avoid foods and drinks that make your symptoms worse, such as: °¨ Caffeine or alcoholic drinks. °¨ Chocolate. °¨ Peppermint or mint flavorings. °¨ Garlic and onions. °¨ Spicy foods. °¨ Citrus fruits, such as oranges, lemons, or limes. °¨ Tomato-based foods such as sauce, chili, salsa, and pizza. °¨ Fried and fatty foods. °· Eat small, frequent meals instead of large meals. °  SEEK IMMEDIATE MEDICAL CARE IF:  °· You have black or dark red stools. °· You vomit blood or material that looks like coffee grounds. °· You are unable to keep fluids down. °· Your abdominal pain gets worse. °· You have a fever. °· You do not feel better after 1 week. °· You have any other questions or concerns. °MAKE SURE YOU: °· Understand these instructions. °· Will watch your condition. °· Will get help right away if you are not doing well or get worse. °Document  Released: 01/19/2001 Document Revised: 07/27/2011 Document Reviewed: 03/10/2011 °Exit[employer]® Patient Information ©2015 [employer]. This information is not intended to replace advice given to you by your health care provider. Make sure you discuss any questions you have with your health care provider. ° °

## 2013-11-29 ENCOUNTER — Ambulatory Visit (HOSPITAL_COMMUNITY): Payer: Self-pay | Admitting: Psychiatry

## 2013-12-01 ENCOUNTER — Emergency Department (HOSPITAL_COMMUNITY): Payer: Self-pay

## 2013-12-01 ENCOUNTER — Observation Stay (HOSPITAL_COMMUNITY): Payer: Self-pay

## 2013-12-01 ENCOUNTER — Encounter (HOSPITAL_COMMUNITY): Payer: Self-pay | Admitting: Emergency Medicine

## 2013-12-01 ENCOUNTER — Observation Stay (HOSPITAL_COMMUNITY)
Admission: EM | Admit: 2013-12-01 | Discharge: 2013-12-04 | Disposition: A | Discharge status: Home / Self Care | Payer: Self-pay | Attending: Internal Medicine | Admitting: Internal Medicine

## 2013-12-01 DIAGNOSIS — G4700 Insomnia, unspecified: Secondary | ICD-10-CM | POA: Insufficient documentation

## 2013-12-01 DIAGNOSIS — F17210 Nicotine dependence, cigarettes, uncomplicated: Secondary | ICD-10-CM | POA: Insufficient documentation

## 2013-12-01 DIAGNOSIS — R0602 Shortness of breath: Secondary | ICD-10-CM

## 2013-12-01 DIAGNOSIS — F419 Anxiety disorder, unspecified: Secondary | ICD-10-CM

## 2013-12-01 DIAGNOSIS — G459 Transient cerebral ischemic attack, unspecified: Secondary | ICD-10-CM

## 2013-12-01 DIAGNOSIS — E669 Obesity, unspecified: Secondary | ICD-10-CM | POA: Insufficient documentation

## 2013-12-01 DIAGNOSIS — R740 Nonspecific elevation of levels of transaminase and lactic acid dehydrogenase [LDH]: Secondary | ICD-10-CM

## 2013-12-01 DIAGNOSIS — I341 Nonrheumatic mitral (valve) prolapse: Secondary | ICD-10-CM | POA: Diagnosis present

## 2013-12-01 DIAGNOSIS — Z79899 Other long term (current) drug therapy: Secondary | ICD-10-CM | POA: Insufficient documentation

## 2013-12-01 DIAGNOSIS — M5481 Occipital neuralgia: Principal | ICD-10-CM | POA: Insufficient documentation

## 2013-12-01 DIAGNOSIS — I10 Essential (primary) hypertension: Secondary | ICD-10-CM | POA: Insufficient documentation

## 2013-12-01 DIAGNOSIS — G2581 Restless legs syndrome: Secondary | ICD-10-CM

## 2013-12-01 DIAGNOSIS — R200 Anesthesia of skin: Secondary | ICD-10-CM | POA: Diagnosis present

## 2013-12-01 DIAGNOSIS — E785 Hyperlipidemia, unspecified: Secondary | ICD-10-CM | POA: Insufficient documentation

## 2013-12-01 DIAGNOSIS — Z720 Tobacco use: Secondary | ICD-10-CM | POA: Diagnosis present

## 2013-12-01 DIAGNOSIS — F329 Major depressive disorder, single episode, unspecified: Secondary | ICD-10-CM | POA: Insufficient documentation

## 2013-12-01 DIAGNOSIS — Z6831 Body mass index (BMI) 31.0-31.9, adult: Secondary | ICD-10-CM | POA: Insufficient documentation

## 2013-12-01 DIAGNOSIS — K529 Noninfective gastroenteritis and colitis, unspecified: Secondary | ICD-10-CM

## 2013-12-01 LAB — COMPREHENSIVE METABOLIC PANEL
ALK PHOS: 105 U/L (ref 39–117)
ALT: 85 U/L — AB (ref 0–35)
AST: 55 U/L — AB (ref 0–37)
Albumin: 4 g/dL (ref 3.5–5.2)
Anion gap: 13 (ref 5–15)
BUN: 12 mg/dL (ref 6–23)
CO2: 26 mEq/L (ref 19–32)
Calcium: 9.2 mg/dL (ref 8.4–10.5)
Chloride: 99 mEq/L (ref 96–112)
Creatinine, Ser: 0.99 mg/dL (ref 0.50–1.10)
GFR calc Af Amer: 74 mL/min — ABNORMAL LOW (ref >90)
GFR calc non Af Amer: 64 mL/min — ABNORMAL LOW (ref >90)
GLUCOSE: 109 mg/dL — AB (ref 70–99)
POTASSIUM: 3.8 meq/L (ref 3.7–5.3)
SODIUM: 138 meq/L (ref 137–147)
TOTAL PROTEIN: 7.3 g/dL (ref 6.0–8.3)
Total Bilirubin: 0.4 mg/dL (ref 0.3–1.2)

## 2013-12-01 LAB — CBC WITH DIFFERENTIAL/PLATELET
BASOS ABS: 0 10*3/uL (ref 0.0–0.1)
Basophils Relative: 0 % (ref 0–1)
EOS ABS: 0.1 10*3/uL (ref 0.0–0.7)
Eosinophils Relative: 2 % (ref 0–5)
HCT: 40.4 % (ref 36.0–46.0)
Hemoglobin: 14 g/dL (ref 12.0–15.0)
LYMPHS ABS: 2.2 10*3/uL (ref 0.7–4.0)
Lymphocytes Relative: 47 % — ABNORMAL HIGH (ref 12–46)
MCH: 32.8 pg (ref 26.0–34.0)
MCHC: 34.7 g/dL (ref 30.0–36.0)
MCV: 94.6 fL (ref 78.0–100.0)
Monocytes Absolute: 0.4 10*3/uL (ref 0.1–1.0)
Monocytes Relative: 10 % (ref 3–12)
NEUTROS PCT: 41 % — AB (ref 43–77)
Neutro Abs: 1.9 10*3/uL (ref 1.7–7.7)
PLATELETS: 147 10*3/uL — AB (ref 150–400)
RBC: 4.27 MIL/uL (ref 3.87–5.11)
RDW: 12.1 % (ref 11.5–15.5)
WBC: 4.6 10*3/uL (ref 4.0–10.5)

## 2013-12-01 LAB — LIPID PANEL
CHOL/HDL RATIO: 3.4 ratio
Cholesterol: 225 mg/dL — ABNORMAL HIGH (ref 0–200)
HDL: 67 mg/dL (ref >39)
LDL CALC: 135 mg/dL — AB (ref 0–99)
Triglycerides: 116 mg/dL (ref <150)
VLDL: 23 mg/dL (ref 0–40)

## 2013-12-01 LAB — TSH: TSH: 1.61 u[IU]/mL (ref 0.350–4.500)

## 2013-12-01 LAB — CBG MONITORING, ED: Glucose-Capillary: 101 mg/dL — ABNORMAL HIGH (ref 70–99)

## 2013-12-01 LAB — HEMOGLOBIN A1C
HEMOGLOBIN A1C: 5.1 % (ref <5.7)
Mean Plasma Glucose: 100 mg/dL (ref <117)

## 2013-12-01 LAB — HIV ANTIBODY (ROUTINE TESTING W REFLEX): HIV 1&2 Ab, 4th Generation: NONREACTIVE

## 2013-12-01 MED ORDER — ACETAMINOPHEN 650 MG RE SUPP: 650.0000 mg | Freq: Four times a day (QID) | RECTAL | Status: DC | PRN

## 2013-12-01 MED ORDER — ENOXAPARIN SODIUM 40 MG/0.4ML ~~LOC~~ SOLN
40.0000 mg | SUBCUTANEOUS | Status: DC
  Administered 2013-12-01 – 2013-12-04 (×4): 40 mg via SUBCUTANEOUS
  Filled 2013-12-01 (×5): qty 0.4

## 2013-12-01 MED ORDER — METOCLOPRAMIDE HCL 10 MG PO TABS
10.0000 mg | ORAL_TABLET | Freq: Four times a day (QID) | ORAL | Status: DC | PRN
  Administered 2013-12-02: 10 mg via ORAL
  Filled 2013-12-01: qty 1

## 2013-12-01 MED ORDER — HYDROXYZINE HCL 25 MG PO TABS
50.0000 mg | ORAL_TABLET | ORAL | Status: DC | PRN
  Administered 2013-12-01 – 2013-12-04 (×9): 50 mg via ORAL
  Filled 2013-12-01 (×10): qty 2

## 2013-12-01 MED ORDER — METOPROLOL SUCCINATE ER 25 MG PO TB24
50.0000 mg | ORAL_TABLET | Freq: Every day | ORAL | Status: DC
  Administered 2013-12-02: 50 mg via ORAL
  Filled 2013-12-01: qty 2

## 2013-12-01 MED ORDER — ALBUTEROL SULFATE HFA 108 (90 BASE) MCG/ACT IN AERS: 2.0000 | INHALATION_SPRAY | Freq: Four times a day (QID) | RESPIRATORY_TRACT | Status: DC | PRN

## 2013-12-01 MED ORDER — PANTOPRAZOLE SODIUM 40 MG PO TBEC
40.0000 mg | DELAYED_RELEASE_TABLET | Freq: Every day | ORAL | Status: DC
  Administered 2013-12-01 – 2013-12-04 (×4): 40 mg via ORAL
  Filled 2013-12-01 (×4): qty 1

## 2013-12-01 MED ORDER — TRAMADOL HCL 50 MG PO TABS
50.0000 mg | ORAL_TABLET | Freq: Four times a day (QID) | ORAL | Status: DC | PRN
  Administered 2013-12-02 – 2013-12-04 (×8): 50 mg via ORAL
  Filled 2013-12-01 (×8): qty 1

## 2013-12-01 MED ORDER — ACETAMINOPHEN 325 MG PO TABS
650.0000 mg | ORAL_TABLET | Freq: Four times a day (QID) | ORAL | Status: DC | PRN
  Administered 2013-12-01: 650 mg via ORAL
  Filled 2013-12-01: qty 2

## 2013-12-01 MED ORDER — GADOBENATE DIMEGLUMINE 529 MG/ML IV SOLN
17.0000 mL | Freq: Once | INTRAVENOUS | Status: AC | PRN
  Administered 2013-12-01: 17 mL via INTRAVENOUS

## 2013-12-01 MED ORDER — PROMETHAZINE HCL 25 MG PO TABS: 12.5000 mg | ORAL_TABLET | Freq: Four times a day (QID) | ORAL | Status: DC | PRN

## 2013-12-01 MED ORDER — FUROSEMIDE 20 MG PO TABS: 10.0000 mg | ORAL_TABLET | Freq: Every day | ORAL | Status: DC | PRN

## 2013-12-01 MED ORDER — ATORVASTATIN CALCIUM 40 MG PO TABS
40.0000 mg | ORAL_TABLET | Freq: Every day | ORAL | Status: DC
  Administered 2013-12-01 – 2013-12-03 (×3): 40 mg via ORAL
  Filled 2013-12-01 (×3): qty 1

## 2013-12-01 MED ORDER — NORTRIPTYLINE HCL 25 MG PO CAPS
50.0000 mg | ORAL_CAPSULE | Freq: Every day | ORAL | Status: DC
  Administered 2013-12-01 – 2013-12-03 (×3): 50 mg via ORAL
  Filled 2013-12-01 (×4): qty 2

## 2013-12-01 MED ORDER — ASPIRIN 325 MG PO TABS
325.0000 mg | ORAL_TABLET | Freq: Every day | ORAL | Status: DC
  Administered 2013-12-01 – 2013-12-03 (×3): 325 mg via ORAL
  Filled 2013-12-01 (×4): qty 1

## 2013-12-01 MED ORDER — ALBUTEROL SULFATE (2.5 MG/3ML) 0.083% IN NEBU: 2.5000 mg | INHALATION_SOLUTION | Freq: Four times a day (QID) | RESPIRATORY_TRACT | Status: DC | PRN

## 2013-12-01 MED ORDER — CITALOPRAM HYDROBROMIDE 10 MG PO TABS
20.0000 mg | ORAL_TABLET | Freq: Every day | ORAL | Status: DC
  Administered 2013-12-02 – 2013-12-04 (×3): 20 mg via ORAL
  Filled 2013-12-01 (×3): qty 2

## 2013-12-01 NOTE — ED Notes (Signed)
To CT via stretcher

## 2013-12-01 NOTE — ED Notes (Addendum)
Pt comes from home, went to bed at 3 am feeling normal and woke up about 530 with left sided tingling of the face and arm. No facial drooping noted, no n/v.

## 2013-12-01 NOTE — H&P (Signed)
Date: 12/01/2013               Patient Name:  Sylvia DesanctisBarbara Hall MRN: [id number]  DOB: 07/07/1960 Age / Sex: 53 y.o., female   PCP: No Pcp Per Patient         Medical Service: Internal Medicine Teaching Service         Attending Physician: Dr. Inez CatalinaEmily B Mullen, MD    First Contact: Dr. Gara Kroneriana Truong Pager: [phone]  Second Contact: Dr. Carlynn PurlErik Hoffman Pager: [phone]       After Hours (After 5p/  First Contact Pager: [phone]  weekends / holidays): Second Contact Pager: [phone]   Chief Complaint: left sided numbness  History of Present Illness: Pt is a 53 y/o female w/ PMHx of anxiety, depression,  Hx of tachycardia, tobacco abuse, and MVP who presents w/ left sided numbness. Pt woke up this morning around 5:00am and noticed left sided numbness. She denied facial droop and was able to speak in complete sentences. She took her morning meds around 6:00 and and her husband drove her to the ED. Pt states numbness started resolving w/in one hour however she is still having left sided facial numbness yet she points to the midline of her face when asked to localize numbness. Pt has never experienced anything like this before. She has a hx of migraines, last migraine was in her 8720's.   Pt is a current smoker and has a 10 pack year hx. She denies HTN or diabetes. No family hx of stroke. She does not drink EtOH or use illicit drugs.    Meds: Current Facility-Administered Medications  Medication Dose Route Frequency Provider Last Rate Last Dose  . acetaminophen (TYLENOL) tablet 650 mg  650 mg Oral Q6H PRN Gara Kroneriana Truong, MD       Or  . acetaminophen (TYLENOL) suppository 650 mg  650 mg Rectal Q6H PRN Gara Kroneriana Truong, MD      . albuterol (PROVENTIL) (2.5 MG/3ML) 0.083% nebulizer solution 2.5 mg  2.5 mg Nebulization Q6H PRN Inez CatalinaEmily B Mullen, MD      . Melene Muller[START ON 12/02/2013] citalopram (CELEXA) tablet 20 mg  20 mg Oral Daily Gara Kroneriana Truong, MD      . enoxaparin (LOVENOX) injection 40 mg  40 mg Subcutaneous Q24H Gara Kroneriana  Truong, MD      . furosemide (LASIX) tablet 10 mg  10 mg Oral Daily PRN Gara Kroneriana Truong, MD      . hydrOXYzine (ATARAX/VISTARIL) tablet 50 mg  50 mg Oral Q4H PRN Gara Kroneriana Truong, MD      . metoCLOPramide (REGLAN) tablet 10 mg  10 mg Oral Q6H PRN Gara Kroneriana Truong, MD      . Melene Muller[START ON 12/02/2013] metoprolol succinate (TOPROL-XL) 24 hr tablet 50 mg  50 mg Oral Daily Gara Kroneriana Truong, MD      . nortriptyline (PAMELOR) capsule 50 mg  50 mg Oral QHS Gara Kroneriana Truong, MD      . pantoprazole (PROTONIX) EC tablet 40 mg  40 mg Oral Daily Gara Kroneriana Truong, MD      . promethazine (PHENERGAN) tablet 12.5 mg  12.5 mg Oral Q6H PRN Gara Kroneriana Truong, MD      . traMADol Janean Sark(ULTRAM) tablet 50 mg  50 mg Oral Q6H PRN Gara Kroneriana Truong, MD        Allergies: Allergies as of 12/01/2013 - Review Complete 12/01/2013  Allergen Reaction Noted  . Penicillins Rash 08/07/2013  . Phenytoin sodium extended Other (See Comments) 02/05/2011  . Aspirin Hives 02/05/2011  . Caffeine  Other (See Comments) 03/17/2011  . Dilantin [phenytoin sodium extended] Other (See Comments) 08/06/2011  . Food Other (See Comments) 02/05/2011  . Ivp dye [iodinated diagnostic agents] Hives 02/05/2011  . Levofloxacin Hives 03/17/2011  . Sulfa drugs cross reactors Hives 02/05/2011  . Sodium nitrate Other (See Comments) 08/06/2011   Past Medical History  Diagnosis Date  . Asthma   . Back pain, chronic   . Thrombophlebitis   . Mitral valve prolapse   . Epilepsy   . Stenosis of lumbosacral spine     numbness L hand and L leg  . Pericarditis   . Depression   . PTSD (post-traumatic stress disorder)   . Anxiety   . Fatigue    Past Surgical History  Procedure Laterality Date  . Nose surgery      s/p trauma  . Appendectomy    . Cesarean section      x 2  . Ovary surgery    . Uterine fibroid surgery    . Breast lumpectomy    . Breast lumpectomy      Right breast  . Biopsy breast      Right breast x 10  . Rhinoplasty     Family History  Problem Relation Age of  Onset  . Diabetes Mother   . Mitral valve prolapse Mother   . Asthma Mother   . CAD Mother 71  . Anxiety disorder Mother   . Mitral valve prolapse Sister   . Asthma Sister   . Mitral valve prolapse Son   . Alcohol abuse Son   . Diabetes Maternal Uncle   . Diabetes Maternal Grandmother   . Mitral valve prolapse Sister   . CAD Son 57    No details available  . Alcohol abuse Father   . Alcohol abuse Brother    History   Social History  . Marital Status: Divorced    Spouse Name: N/A    Number of Children: 2  . Years of Education: N/A   Occupational History  . Not on file.   Social History Main Topics  . Smoking status: Current Every Day Smoker -- 1.00 packs/day for 12 years    Types: Cigarettes  . Smokeless tobacco: Not on file  . Alcohol Use: No  . Drug Use: No  . Sexual Activity: Not Currently    Birth Control/ Protection: None   Other Topics Concern  . Not on file   Social History Narrative   Lives alone.      Review of Systems: Constitutional: negative Ears, nose, mouth, throat, and face: negative for difficulty swallowing, change in taste or smell. Positive for facial pressure Gastrointestinal: positive for abdominal pain and nausea. negative for vomiting or constipation Musculoskeletal:negative for muscle weakness Neurological: positive for left sided numbnes, negative for gait abnormalities Behavioral/Psych: positive for anxiety  Physical Exam: Blood pressure 124/70, pulse 76, temperature 98.4 F (36.9 C), temperature source Oral, resp. rate 18, height 5\' 3"  (1.6 m), weight 180 lb (81.647 kg), SpO2 96.00%. BP 124/70  Pulse 76  Temp(Src) 98.4 F (36.9 C) (Oral)  Resp 18  Ht 5\' 3"  (1.6 m)  Wt 180 lb (81.647 kg)  BMI 31.89 kg/m2  SpO2 96%  General Appearance:    Alert, cooperative, no distress, appears stated age  Head:    Normocephalic, without obvious abnormality, atraumatic, tender to percussion of left maxillary, ethmoid, and frontal sinuses    Eyes:    PERRL, conjunctiva/corneas clear, EOM's intact. Photosensitivity in rt eye  Throat:   Moist mucous membranes, non deviated uvula  Lungs:     Expiratory wheezing on rt upper lung field, respirations unlabored   Heart:    Regular rate and rhythm, S1 and S2 normal  Abdomen:    soft, tender to palpation of rt upper quadrant, no masses felt, active bowel sounds  Neurologic:   CNII-XII intact, normal strength in upper and lower extremities. Decreased sensation on left forehead compared to rt side. Negative pronator drift, able to perform heel/shin test and finger to nose test       Lab results: Basic Metabolic Panel:  Recent Labs  53/29/92 0655  NA 138  K 3.8  CL 99  CO2 26  GLUCOSE 109*  BUN 12  CREATININE 0.99  CALCIUM 9.2   Liver Function Tests:  Recent Labs  12/01/13 0655  AST 55*  ALT 85*  ALKPHOS 105  BILITOT 0.4  PROT 7.3  ALBUMIN 4.0  CBC:  Recent Labs  12/01/13 0655  WBC 4.6  NEUTROABS 1.9  HGB 14.0  HCT 40.4  MCV 94.6  PLT 147*   CBG:  Recent Labs  12/01/13 0654  GLUCAP 101*     Imaging results:  Dg Chest 2 View  12/01/2013   CLINICAL DATA:  Asthma. History of mitral valve prolapse. Patient described shortness of breath for 2 days with cough. History of hypertension and smoking.  EXAM: CHEST  2 VIEW  COMPARISON:  05/26/1938  FINDINGS: Heart, mediastinum hila are unremarkable.  Lungs are clear.  No pleural effusion or pneumothorax.  Bony thorax is demineralized but intact.  IMPRESSION: No active cardiopulmonary disease.   Electronically Signed   By: Amie Portland M.D.   On: 12/01/2013 09:22   Ct Head Wo Contrast  12/01/2013   CLINICAL DATA:  Patient woke this morning with left-sided tingling of the face and all arm. No facial droop.  EXAM: CT HEAD WITHOUT CONTRAST  TECHNIQUE: Contiguous axial images were obtained from the base of the skull through the vertex without intravenous contrast.  COMPARISON:  None.  FINDINGS: Ventricles are normal  in size and configuration. There are no parenchymal masses or mass effect. There is no evidence of an infarct. There are no extra-axial masses or abnormal fluid collections.  No intracranial hemorrhage.  Sinuses and mastoid air cells are clear.  No skull lesion.  IMPRESSION: Normal unenhanced CT scan the brain.   Electronically Signed   By: Amie Portland M.D.   On: 12/01/2013 08:41    Other results: EKG: sinus tachycardia, no ST elevation  Assessment & Plan by Problem: Principal Problem:   Left sided numbness Active Problems:   MVP (mitral valve prolapse)   Tobacco user  Left sided numbness: DDx migraine, TIA. ABCD scale 4. Pt states left sided numbness has resolved w/ residual left sided cheek numbness. However during course of exam pt was exquisitely tender to percussion of left maxillary sinus and pointed to nose, chin, and midline forehead when asked to localize numbness. Pt had photophobia in rt eye however denies fever or neck stiffness thus unlikely meningeal in nature. Pt was having a HA during exam and requesting medication for HA, last migraine was in her 20's and pt could have experienced a migraine.  - admit to telemetry for further TIA workup - ordered MRI of brain - lipid panel, HbA1c, TSH, HIV pending - neurology consulted - pt allergic to aspirin( gets hives) antiplatelet tx per neurology  - neuro checks q4h - tylenol 650mg  for  HA  Gastroenteritis- pt last seen in the ED for this on 10/18 at which time CT abd/pelvis which was negative for any acute process, and u/s of abd noted hepatic steatosis. She was d/c'd home with a PPI and reglan. Pt states she feels better now that she is having bowel movements.  - reglan 10mg  q6h as needed - protonix  Hx of tachycardia-- HR on admission 108, now 76. on home toprol 50mg  QD  - continue home med  Insomnia-- on home nortriptyline 50mg  qhs - continue home med  Depression - continue home med celexa  Tobacco abuse - tobacco  cessation consult  FEN:  - lovenox  - regular diet once passes swallow study   Dispo: Disposition is deferred at this time, awaiting improvement of current medical problems. Anticipated discharge in approximately 1-2 day(s).   The patient does not have a current PCP (No Pcp Per Patient) and [facility] after [facility].  The patient does not have transportation limitations that hinder transportation to clinic appointments.  Signed: Gara Kroneriana Truong, MD 12/01/2013, 1:05 PM

## 2013-12-01 NOTE — ED Provider Notes (Signed)
CSN: [id number]     Arrival date & time 12/01/13  [id number] History   First MD Initiated Contact with Patient 12/01/13 [id number]     Chief Complaint  Patient presents with  . Numbness  . Weakness     (Consider location/radiation/quality/duration/timing/severity/associated sxs/prior Treatment) HPI Comments: Patient here complaining of left-sided numbness when she woke up this morning. Symptoms are located at the left side of her face and tongue as well as her left upper and lower extremity. They have gradually been improving. Denies any headache. No visual changes. No confusion noted. No ataxia noted. No prior history of same. No treatment used prior to arrival. Nothing makes her symptoms better worse She also notes a nonproductive cough is worse when she inhales. She is a daily smoker. No reported nausea or vomiting.  Patient is a 53 y.o. female presenting with weakness. The history is provided by the patient and the spouse.  Weakness    Past Medical History  Diagnosis Date  . Asthma   . Back pain, chronic   . Thrombophlebitis   . Mitral valve prolapse   . Epilepsy   . Stenosis of lumbosacral spine     numbness L hand and L leg  . Pericarditis   . Depression   . PTSD (post-traumatic stress disorder)   . Anxiety   . Fatigue    Past Surgical History  Procedure Laterality Date  . Nose surgery      s/p trauma  . Appendectomy    . Cesarean section      x 2  . Ovary surgery    . Uterine fibroid surgery    . Breast lumpectomy    . Breast lumpectomy      Right breast  . Biopsy breast      Right breast x 10  . Rhinoplasty     Family History  Problem Relation Age of Onset  . Diabetes Mother   . Mitral valve prolapse Mother   . Asthma Mother   . CAD Mother 3173  . Anxiety disorder Mother   . Mitral valve prolapse Sister   . Asthma Sister   . Mitral valve prolapse Son   . Alcohol abuse Son   . Diabetes Maternal Uncle   . Diabetes Maternal Grandmother   . Mitral valve prolapse  Sister   . CAD Son 5123    No details available  . Alcohol abuse Father   . Alcohol abuse Brother    History  Substance Use Topics  . Smoking status: Current Every Day Smoker -- 1.00 packs/day for 12 years    Types: Cigarettes  . Smokeless tobacco: Not on file  . Alcohol Use: No   OB History   Grav Para Term Preterm Abortions TAB SAB Ect Mult Living   2 2 0 2      2     Review of Systems  Neurological: Positive for weakness.  All other systems reviewed and are negative.     Allergies  Penicillins; Phenytoin sodium extended; Aspirin; Caffeine; Dilantin; Food; Ivp dye; Levofloxacin; Sulfa drugs cross reactors; and Sodium nitrate  Home Medications   Prior to Admission medications   Medication Sig Start Date End Date Taking? Authorizing Provider  albuterol (PROVENTIL HFA;VENTOLIN HFA) 108 (90 BASE) MCG/ACT inhaler Inhale 2 puffs into the lungs every 6 (six) hours as needed. For shortness of breath 04/12/12 11/26/13  Leroy SeaPrashant K Singh, MD  citalopram (CELEXA) 20 MG tablet Take 1 tablet (20 mg total) by mouth daily.  10/24/13 10/24/14  Archer AsaGerald Plovsky, MD  furosemide (LASIX) 20 MG tablet Take 10 mg by mouth daily as needed for edema.    Historical Provider, MD  hydrOXYzine (ATARAX/VISTARIL) 25 MG tablet Take 50 mg by mouth every 4 (four) hours as needed for anxiety.    Historical Provider, MD  lansoprazole (PREVACID) 30 MG capsule Take 1 capsule (30 mg total) by mouth daily at 12 noon. 11/26/13   Loren Raceravid Yelverton, MD  metoCLOPramide (REGLAN) 10 MG tablet Take 1 tablet (10 mg total) by mouth every 6 (six) hours as needed for nausea or vomiting (nausea/headache). 11/26/13   Loren Raceravid Yelverton, MD  metoprolol succinate (TOPROL-XL) 50 MG 24 hr tablet Take 1 tablet (50 mg total) by mouth daily. 07/27/13 07/27/14  Rollene RotundaJames Hochrein, MD  nortriptyline (PAMELOR) 25 MG capsule 2  qhs 10/24/13   Archer AsaGerald Plovsky, MD  traMADol (ULTRAM) 50 MG tablet Take 1 tablet (50 mg total) by mouth every 6 (six) hours as needed  for moderate pain or severe pain. 11/26/13   Loren Raceravid Yelverton, MD   BP 123/91  Pulse 108  Temp(Src) 97.8 F (36.6 C) (Oral)  Resp 22  Ht 5\' 3"  (1.6 m)  Wt 180 lb (81.647 kg)  BMI 31.89 kg/m2  SpO2 99% Physical Exam  Nursing note and vitals reviewed. Constitutional: She is oriented to person, place, and time. She appears well-developed and well-nourished.  Non-toxic appearance. No distress.  HENT:  Head: Normocephalic and atraumatic.  Eyes: Conjunctivae, EOM and lids are normal. Pupils are equal, round, and reactive to light.  Neck: Normal range of motion. Neck supple. No tracheal deviation present. No mass present.  Cardiovascular: Regular rhythm and normal heart sounds.  Tachycardia present.  Exam reveals no gallop.   No murmur heard. Pulmonary/Chest: Effort normal and breath sounds normal. No stridor. No respiratory distress. She has no decreased breath sounds. She has no wheezes. She has no rhonchi. She has no rales.  Abdominal: Soft. Normal appearance and bowel sounds are normal. She exhibits no distension. There is no tenderness. There is no rebound and no CVA tenderness.  Musculoskeletal: Normal range of motion. She exhibits no edema and no tenderness.  Neurological: She is alert and oriented to person, place, and time. She has normal strength. No cranial nerve deficit or sensory deficit. Coordination normal. GCS eye subscore is 4. GCS verbal subscore is 5. GCS motor subscore is 6.  Skin: Skin is warm and dry. No abrasion and no rash noted.  Psychiatric: She has a normal mood and affect. Her speech is normal and behavior is normal.    ED Course  Procedures (including critical care time) Labs Review Labs Reviewed  CBG MONITORING, ED - Abnormal; Notable for the following:    Glucose-Capillary 101 (*)    All other components within normal limits  CBC WITH DIFFERENTIAL  COMPREHENSIVE METABOLIC PANEL    Imaging Review No results found.   EKG Interpretation   Date/Time:   Saturday December 01 2013 06:52:38 EDT Ventricular Rate:  106 PR Interval:  163 QRS Duration: 110 QT Interval:  352 QTC Calculation: 467 R Axis:   -6 Text Interpretation:  Age not entered, assumed to be  53 years old for  purpose of ECG interpretation Sinus tachycardia Confirmed by Freida BusmanALLEN  MD,  Kathalene Sporer ([id number]) on 12/01/2013 7:20:39 AM      MDM   Final diagnoses:  Numbness  SOB (shortness of breath)    Patient's head CT did not show any acute process at this time.  Patient's symptoms have been improving. Suspect that she has a TIA that is resolving. She has no objective findings on exam. She has no facial droop. She has no slurred speech. Her strength is normal in her upper as well as lower extremities. She does have some subjective numbness which sensation test cannot reproduce.NIH stroke scale 0. Not a candidate for TPA. Medicine to admit    Toy Baker, MD 12/01/13 [phone]

## 2013-12-01 NOTE — Consult Note (Signed)
Referring Physician: Dr. Daryll Drown    Chief Complaint: left sided paresthesias  HPI:                                                                                                                                         Sylvia Hall is an 53 y.o. female with a past medical history significant for depression, anxiety, PTSD, MVP, asthma, lumbar spine stenosis, and seizures (last seizure was she was in high school)admitted to [facility] with new onset left sided numbness-tingling. Stated that she never had similar symptoms before. Patient stated that she went to bed around 3 am today and everything was fine. Woke up at 5 am with a numb-tingly-pins and needles sensation involving left face-arm and leg. She said that she had trouble walking and has been having a HA-neck pain since yesterday, but denies vertigo, double vision, difficulty swallowing, confusion, disorientation, slurred speech, focal weakness, language or vision impairment. Mrs. Seat said that these symptoms are improving but still has numbness of the left nostril-left forehead-left side of the mouth and a small area of the left leg. CT brain showed no acute intracranial abnormality. Date last known well: 12/01/13 Time last known well: 3 am tPA Given: no, symptoms virtually resolved NIHSS: 1    Past Medical History  Diagnosis Date  . Asthma   . Back pain, chronic   . Thrombophlebitis   . Mitral valve prolapse   . Epilepsy   . Stenosis of lumbosacral spine     numbness L hand and L leg  . Pericarditis   . Depression   . PTSD (post-traumatic stress disorder)   . Anxiety   . Fatigue     Past Surgical History  Procedure Laterality Date  . Nose surgery      s/p trauma  . Appendectomy    . Cesarean section      x 2  . Ovary surgery    . Uterine fibroid surgery    . Breast lumpectomy    . Breast lumpectomy      Right breast  . Biopsy breast      Right breast x 10  . Rhinoplasty      Family History  Problem Relation Age of  Onset  . Diabetes Mother   . Mitral valve prolapse Mother   . Asthma Mother   . CAD Mother 25  . Anxiety disorder Mother   . Mitral valve prolapse Sister   . Asthma Sister   . Mitral valve prolapse Son   . Alcohol abuse Son   . Diabetes Maternal Uncle   . Diabetes Maternal Grandmother   . Mitral valve prolapse Sister   . CAD Son 51    No details available  . Alcohol abuse Father   . Alcohol abuse Brother    Social History:  reports that she has been smoking Cigarettes.  She has a 12 pack-year smoking  history. She does not have any smokeless tobacco history on file. She reports that she does not drink alcohol or use illicit drugs.  Allergies:  Allergies  Allergen Reactions  . Penicillins Rash    Pt called in stating she  Was prescribed Pen VK in ED and developed a generalized raised rash  . Phenytoin Sodium Extended Other (See Comments)    seizures  . Aspirin Hives  . Caffeine Other (See Comments)    Heart races  . Dilantin [Phenytoin Sodium Extended] Other (See Comments)    Seizure   . Food Other (See Comments)    Sodium nitrates-headaches   . Ivp Dye [Iodinated Diagnostic Agents] Hives  . Levofloxacin Hives  . Sulfa Drugs Cross Reactors Hives  . Sodium Nitrate Other (See Comments)    headaches    Medications:                                                                                                                           Scheduled: . [START ON 12/02/2013] citalopram  20 mg Oral Daily  . enoxaparin (LOVENOX) injection  40 mg Subcutaneous Q24H  . [START ON 12/02/2013] metoprolol succinate  50 mg Oral Daily  . nortriptyline  50 mg Oral QHS  . pantoprazole  40 mg Oral Daily    ROS:                                                                                                                                       History obtained from the patient and chart review  General ROS: negative for - chills, fatigue, fever, night sweats, or weight  loss Psychological ROS: negative for - behavioral disorder, hallucinations, memory difficulties, mood swings or suicidal ideation Ophthalmic ROS: negative for - blurry vision, double vision, eye pain or loss of vision ENT ROS: negative for - epistaxis, nasal discharge, oral lesions, sore throat, tinnitus or vertigo Allergy and Immunology ROS: negative for - hives or itchy/watery eyes Hematological and Lymphatic ROS: negative for - bleeding problems, bruising or swollen lymph nodes Endocrine ROS: negative for - galactorrhea, hair pattern changes, polydipsia/polyuria or temperature intolerance Respiratory ROS: negative for - cough, hemoptysis, shortness of breath or wheezing Cardiovascular ROS: negative for - chest pain, dyspnea on exertion, edema or irregular heartbeat Gastrointestinal ROS: negative for - abdominal pain, diarrhea, hematemesis, nausea/vomiting or stool incontinence Genito-Urinary ROS: negative for -  dysuria, hematuria, incontinence or urinary frequency/urgency Musculoskeletal ROS: negative for - joint swelling or muscular weakness Neurological ROS: as noted in HPI Dermatological ROS: negative for rash and skin lesion changes  Physical exam: pleasant female in no apparent distress. Blood pressure 124/70, pulse 76, temperature 98.4 F (36.9 C), temperature source Oral, resp. rate 18, height _0  (1.6 m), weight 81.647 kg (180 lb), SpO2 96.00%. Head: normocephalic. Neck: supple, no bruits, no JVD. Cardiac: no murmurs. Lungs: clear. Abdomen: soft, no tender, no mass. Extremities: no edema. Neurologic Examination:                                                                                                      General: Mental Status: Alert, oriented, thought content appropriate.  Speech fluent without evidence of aphasia.  Able to follow 3 step commands without difficulty. Cranial Nerves: II: Discs flat bilaterally; Visual fields grossly normal, pupils equal, round, reactive  to light and accommodation III,IV, VI: ptosis not present, extra-ocular motions intact bilaterally V,VII: smile symmetric, facial light touch sensation normal bilaterally VIII: hearing normal bilaterally IX,X: gag reflex present XI: bilateral shoulder shrug XII: midline tongue extension without atrophy or fasciculations  Motor: Right : Upper extremity   5/5    Left:     Upper extremity   5/5  Lower extremity   5/5     Lower extremity   5/5 Tone and bulk:normal tone throughout; no atrophy noted Sensory: Pinprick and light touch intact diminished left face. Deep Tendon Reflexes:  Right: Upper Extremity   Left: Upper extremity   biceps (C-5 to C-6) 2/4   biceps (C-5 to C-6) 2/4 tricep (C7) 2/4    triceps (C7) 2/4 Brachioradialis (C6) 2/4  Brachioradialis (C6) 2/4  Lower Extremity Lower Extremity  quadriceps (L-2 to L-4) 2/4   quadriceps (L-2 to L-4) 2/4 Achilles (S1) 2/4   Achilles (S1) 2/4  Plantars: Right: downgoing   Left: downgoing Cerebellar: normal finger-to-nose,  normal heel-to-shin test Gait:  No tested CV: pulses palpable throughout    Results for orders placed during the hospital encounter of 12/01/13 (from the past 48 hour(s))  CBG MONITORING, ED     Status: Abnormal   Collection Time    12/01/13  6:54 AM      Result Value Ref Range   Glucose-Capillary 101 (*) 70 - 99 mg/dL  CBC WITH DIFFERENTIAL     Status: Abnormal   Collection Time    12/01/13  6:55 AM      Result Value Ref Range   WBC 4.6  4.0 - 10.5 K/uL   RBC 4.27  3.87 - 5.11 MIL/uL   Hemoglobin 14.0  12.0 - 15.0 g/dL   HCT 40.4  36.0 - 46.0 %   MCV 94.6  78.0 - 100.0 fL   MCH 32.8  26.0 - 34.0 pg   MCHC 34.7  30.0 - 36.0 g/dL   RDW 12.1  11.5 - 15.5 %   Platelets 147 (*) 150 - 400 K/uL   Neutrophils Relative % 41 (*) 43 - 77 %  Neutro Abs 1.9  1.7 - 7.7 K/uL   Lymphocytes Relative 47 (*) 12 - 46 %   Lymphs Abs 2.2  0.7 - 4.0 K/uL   Monocytes Relative 10  3 - 12 %   Monocytes Absolute 0.4  0.1  - 1.0 K/uL   Eosinophils Relative 2  0 - 5 %   Eosinophils Absolute 0.1  0.0 - 0.7 K/uL   Basophils Relative 0  0 - 1 %   Basophils Absolute 0.0  0.0 - 0.1 K/uL  COMPREHENSIVE METABOLIC PANEL     Status: Abnormal   Collection Time    12/01/13  6:55 AM      Result Value Ref Range   Sodium 138  137 - 147 mEq/L   Potassium 3.8  3.7 - 5.3 mEq/L   Chloride 99  96 - 112 mEq/L   CO2 26  19 - 32 mEq/L   Glucose, Bld 109 (*) 70 - 99 mg/dL   BUN 12  6 - 23 mg/dL   Creatinine, Ser 0.99  0.50 - 1.10 mg/dL   Calcium 9.2  8.4 - 10.5 mg/dL   Total Protein 7.3  6.0 - 8.3 g/dL   Albumin 4.0  3.5 - 5.2 g/dL   AST 55 (*) 0 - 37 U/L   ALT 85 (*) 0 - 35 U/L   Alkaline Phosphatase 105  39 - 117 U/L   Total Bilirubin 0.4  0.3 - 1.2 mg/dL   GFR calc non Af Amer 64 (*) >90 mL/min   GFR calc Af Amer 74 (*) >90 mL/min   Comment: (NOTE)     The eGFR has been calculated using the CKD EPI equation.     This calculation has not been validated in all clinical situations.     eGFR's persistently <90 mL/min signify possible Chronic Kidney     Disease.   Anion gap 13  5 - 15   Dg Chest 2 View  12/01/2013   CLINICAL DATA:  Asthma. History of mitral valve prolapse. Patient described shortness of breath for 2 days with cough. History of hypertension and smoking.  EXAM: CHEST  2 VIEW  COMPARISON:  05/26/1938  FINDINGS: Heart, mediastinum hila are unremarkable.  Lungs are clear.  No pleural effusion or pneumothorax.  Bony thorax is demineralized but intact.  IMPRESSION: No active cardiopulmonary disease.   Electronically Signed   By: Lajean Manes M.D.   On: 12/01/2013 09:22   Ct Head Wo Contrast  12/01/2013   CLINICAL DATA:  Patient woke this morning with left-sided tingling of the face and all arm. No facial droop.  EXAM: CT HEAD WITHOUT CONTRAST  TECHNIQUE: Contiguous axial images were obtained from the base of the skull through the vertex without intravenous contrast.  COMPARISON:  None.  FINDINGS: Ventricles are  normal in size and configuration. There are no parenchymal masses or mass effect. There is no evidence of an infarct. There are no extra-axial masses or abnormal fluid collections.  No intracranial hemorrhage.  Sinuses and mastoid air cells are clear.  No skull lesion.  IMPRESSION: Normal unenhanced CT scan the brain.   Electronically Signed   By: Lajean Manes M.D.   On: 12/01/2013 08:41    Assessment: 53 y.o. female with new onset paresthesias left face-arm-leg, rapidly improving. NIHSS 1. CT brain unremarkable. Subcortical pure sensory right TIA/stroke right (thalamus?). Agree with completing TIA/stroke work up. Aspirin. Stroke team will follow up tomorrow.  Stroke Risk Factors - none  Plan: 1.  HgbA1c, fasting lipid panel 2. MRI, MRA  of the brain without contrast 3. Echocardiogram 4. Carotid dopplers 5. Prophylactic therapy-aspirin 6. Risk factor modification 7. Telemetry monitoring 8. Frequent neuro checks 9. PT/OT SLP  Dorian Pod, MD [facility] [phone]  12/01/2013, 12:52 PM

## 2013-12-01 NOTE — ED Notes (Signed)
Internal Medicine at the bedside. 

## 2013-12-02 DIAGNOSIS — G459 Transient cerebral ischemic attack, unspecified: Secondary | ICD-10-CM

## 2013-12-02 DIAGNOSIS — E785 Hyperlipidemia, unspecified: Secondary | ICD-10-CM

## 2013-12-02 DIAGNOSIS — M5481 Occipital neuralgia: Principal | ICD-10-CM

## 2013-12-02 LAB — BASIC METABOLIC PANEL
Anion gap: 11 (ref 5–15)
BUN: 17 mg/dL (ref 6–23)
CHLORIDE: 101 meq/L (ref 96–112)
CO2: 26 meq/L (ref 19–32)
CREATININE: 1.01 mg/dL (ref 0.50–1.10)
Calcium: 9.4 mg/dL (ref 8.4–10.5)
GFR calc Af Amer: 72 mL/min — ABNORMAL LOW (ref >90)
GFR calc non Af Amer: 62 mL/min — ABNORMAL LOW (ref >90)
GLUCOSE: 105 mg/dL — AB (ref 70–99)
Potassium: 3.9 mEq/L (ref 3.7–5.3)
Sodium: 138 mEq/L (ref 137–147)

## 2013-12-02 LAB — CBC
HEMATOCRIT: 38 % (ref 36.0–46.0)
HEMOGLOBIN: 13.1 g/dL (ref 12.0–15.0)
MCH: 32.7 pg (ref 26.0–34.0)
MCHC: 34.5 g/dL (ref 30.0–36.0)
MCV: 94.8 fL (ref 78.0–100.0)
Platelets: 161 10*3/uL (ref 150–400)
RBC: 4.01 MIL/uL (ref 3.87–5.11)
RDW: 12.1 % (ref 11.5–15.5)
WBC: 4.5 10*3/uL (ref 4.0–10.5)

## 2013-12-02 LAB — HEPATITIS PANEL, ACUTE
HCV AB: NEGATIVE
Hep A IgM: NONREACTIVE
Hep B C IgM: NONREACTIVE
Hepatitis B Surface Ag: NEGATIVE

## 2013-12-02 LAB — HEPATIC FUNCTION PANEL
ALK PHOS: 100 U/L (ref 39–117)
ALT: 70 U/L — ABNORMAL HIGH (ref 0–35)
AST: 43 U/L — AB (ref 0–37)
Albumin: 4 g/dL (ref 3.5–5.2)
BILIRUBIN TOTAL: 0.5 mg/dL (ref 0.3–1.2)
Total Protein: 7.3 g/dL (ref 6.0–8.3)

## 2013-12-02 MED ORDER — ATORVASTATIN CALCIUM 40 MG PO TABS: 40.0000 mg | ORAL_TABLET | Freq: Every day | ORAL | Status: DC

## 2013-12-02 MED ORDER — IBUPROFEN 600 MG PO TABS
600.0000 mg | ORAL_TABLET | Freq: Four times a day (QID) | ORAL | Status: DC | PRN
  Filled 2013-12-02 (×2): qty 1

## 2013-12-02 MED ORDER — SENNOSIDES-DOCUSATE SODIUM 8.6-50 MG PO TABS: 1.0000 | ORAL_TABLET | Freq: Once | ORAL | Status: AC | PRN

## 2013-12-02 MED ORDER — METOPROLOL SUCCINATE ER 25 MG PO TB24
50.0000 mg | ORAL_TABLET | Freq: Every day | ORAL | Status: DC
  Administered 2013-12-03 – 2013-12-04 (×2): 50 mg via ORAL
  Filled 2013-12-02 (×2): qty 2

## 2013-12-02 NOTE — Progress Notes (Signed)
Subjective: Pt reporting lt cheek numbness, HA, and feeling of pressure over her head. Denies congestion. Pt states numbness moves around to other side of face. Pt does not want to be on daily statin b/c she does not like taking medications. Informed statin could cause myalgia and patient already has RLS and difficulties w/ muscle cramping in LE. Pt prefers diet and exercise.   Overnight tele negative  Objective: Vital signs in last 24 hours: Filed Vitals:   12/01/13 2200 12/02/13 0000 12/02/13 0200 12/02/13 0257  BP: 103/61 100/84 81/44 130/85  Pulse: 85 87 88 91  Temp: 98 F (36.7 C) 97.8 F (36.6 C) 97.9 F (36.6 C)   TempSrc: Oral Oral Oral   Resp: 18     Height:      Weight:      SpO2: 98% 99% 98%    Physical Exam General: NAD, eating breakfast Pulm: CTAB Cardiac: RRR GI: active bowel sounds, tender to palpation of RUQ, no masses felt Neuro: CN 2-12 grossly intact, moving all extremities  Lab Results: Basic Metabolic Panel:  Recent Labs Lab 12/01/13 0655 12/02/13 0520  NA 138 138  K 3.8 3.9  CL 99 101  CO2 26 26  GLUCOSE 109* 105*  BUN 12 17  CREATININE 0.99 1.01  CALCIUM 9.2 9.4   Liver Function Tests:  Recent Labs Lab 11/25/13 1930 12/01/13 0655  AST 87* 55*  ALT 122* 85*  ALKPHOS 113 105  BILITOT 0.5 0.4  PROT 8.0 7.3  ALBUMIN 4.5 4.0    Recent Labs Lab 11/25/13 1930  LIPASE 28   CBC:  Recent Labs Lab 11/25/13 1930 12/01/13 0655 12/02/13 0520  WBC 5.3 4.6 4.5  NEUTROABS 3.3 1.9  --   HGB 15.1* 14.0 13.1  HCT 43.7 40.4 38.0  MCV 94.0 94.6 94.8  PLT 168 147* 161   CBG:  Recent Labs Lab 12/01/13 0654  GLUCAP 101*   Hemoglobin A1C:  Recent Labs Lab 12/01/13 1615  HGBA1C 5.1   Fasting Lipid Panel:  Recent Labs Lab 12/01/13 1615  CHOL 225*  HDL 67  LDLCALC 135*  TRIG 116  CHOLHDL 3.4   Thyroid Function Tests:  Recent Labs Lab 12/01/13 1615  TSH 1.610    Studies/Results: Dg Chest 2 View  12/01/2013    CLINICAL DATA:  Asthma. History of mitral valve prolapse. Patient described shortness of breath for 2 days with cough. History of hypertension and smoking.  EXAM: CHEST  2 VIEW  COMPARISON:  05/26/1938  FINDINGS: Heart, mediastinum hila are unremarkable.  Lungs are clear.  No pleural effusion or pneumothorax.  Bony thorax is demineralized but intact.  IMPRESSION: No active cardiopulmonary disease.   Electronically Signed   By: Amie Portlandavid  Ormond M.D.   On: 12/01/2013 09:22   Ct Head Wo Contrast  12/01/2013   CLINICAL DATA:  Patient woke this morning with left-sided tingling of the face and all arm. No facial droop.  EXAM: CT HEAD WITHOUT CONTRAST  TECHNIQUE: Contiguous axial images were obtained from the base of the skull through the vertex without intravenous contrast.  COMPARISON:  None.  FINDINGS: Ventricles are normal in size and configuration. There are no parenchymal masses or mass effect. There is no evidence of an infarct. There are no extra-axial masses or abnormal fluid collections.  No intracranial hemorrhage.  Sinuses and mastoid air cells are clear.  No skull lesion.  IMPRESSION: Normal unenhanced CT scan the brain.   Electronically Signed   By: Onalee Huaavid  Ormond M.D.   On: 12/01/2013 08:41   Mr Laqueta JeanBrain W ZOWo Contrast  12/01/2013   CLINICAL DATA:  Numbness in the left cheek and nose below the IA. The sensation is similar to the anesthetic effect of dental work. Personal history of seizures. Paresthesias are noted in the left upper and lower extremity as well. Difficulty walking. Headache and neck pain.  EXAM: MRI HEAD WITHOUT AND WITH CONTRAST  TECHNIQUE: Multiplanar, multiecho pulse sequences of the brain and surrounding structures were obtained without and with intravenous contrast.  CONTRAST:  17mL MULTIHANCE GADOBENATE DIMEGLUMINE 529 MG/ML IV SOLN  COMPARISON:  CT head without contrast 12/01/2013.  FINDINGS: The diffusion-weighted images demonstrate no evidence for acute or subacute infarction. No  hemorrhage or mass lesion is evident. No significant white matter disease is evident. The ventricles are of normal size. No significant extraaxial fluid collection is present.  Flow is present in the major intracranial arteries. The globes and orbits are intact. Minimal mucosal thickening is present in a posterior right ethmoid air cell. The paranasal sinuses and mastoid air cells are otherwise clear.  The postcontrast images demonstrate no pathologic enhancement.  IMPRESSION: Negative MRI of the brain without and with contrast. No acute or focal lesion to explain the patient's paresthesias and numbness.   Electronically Signed   By: Gennette Pachris  Mattern M.D.   On: 12/01/2013 16:01   Medications: I have reviewed the patient's current medications. Scheduled Meds: . aspirin  325 mg Oral Daily  . atorvastatin  40 mg Oral q1800  . citalopram  20 mg Oral Daily  . enoxaparin (LOVENOX) injection  40 mg Subcutaneous Q24H  . metoprolol succinate  50 mg Oral Daily  . nortriptyline  50 mg Oral QHS  . pantoprazole  40 mg Oral Daily   Continuous Infusions:  PRN Meds:.acetaminophen, acetaminophen, albuterol, furosemide, hydrOXYzine, ibuprofen, metoCLOPramide, promethazine, traMADol Assessment/Plan: Principal Problem:   Left sided numbness Active Problems:   MVP (mitral valve prolapse)   Tobacco user   Left sided numbness: reports movement of face numbness to opposite side of face and sx of sinusitis. CT of head was negative for sinusitis. Pt may have viral URI but denies congestion. Could also be allergies.  - MRI and CT of head negative, pending ECHO and carotid dopplers - neurology following - LDL elevated, pt does not want to be started on statin, informed will give prescription for lipitor if she changes her mind - HbA1c 5.1, HIV negative - neuro checks q4h  - tylenol 650mg  for HA   Gastroenteritis- pt last seen in the ED for this on 10/18 at which time CT abd/pelvis which was negative for any acute  process, and u/s of abd noted hepatic steatosis. She was d/c'd home with a PPI and reglan.  - pt still having RUQ pain, CT of abd/pelvis negative as above, likely pt having constipation, will add senokot once prn - reglan 10mg  q6h as needed which pt has not required, informed nurse that she should administer this if pt is having abdominal pain  - protonix   Hx of tachycardia-- HR on admission 108, now 76. on home toprol 50mg  QD  - continue home med   Insomnia-- on home nortriptyline 50mg  qhs  - continue home med   Depression  - continue home med celexa   FEN:  - lovenox  - regular diet   Dispo: Disposition is deferred at this time, awaiting improvement of current medical problems. Anticipated discharge in approximately 1-2 day(s) .  The  patient does not have a current PCP (No Pcp Per Patient) and does not need an [facility] hospital follow-up appointment after discharge.   The patient does not have transportation limitations that hinder transportation to clinic appointments.    LOS: 1 day   Gara Kroner, MD 12/02/2013, 9:42 AM

## 2013-12-02 NOTE — Progress Notes (Signed)
Utilization Review Completed.   Malachai Schalk, RN, BSN Nurse Case Manager  

## 2013-12-02 NOTE — Discharge Summary (Signed)
Name: Sylvia DesanctisBarbara Hall MRN: [id number] DOB: 12/08/1960 53 y.o. PCP: No Pcp Per Patient  Date of Admission: 12/01/2013  6:42 AM Date of Discharge: 12/05/2013 Attending Physician: No att. providers found  Discharge Diagnosis: Principal Problem:   Left sided numbness Active Problems:   MVP (mitral valve prolapse)   Tobacco user  Discharge Medications:   Medication List    STOP taking these medications       metoCLOPramide 10 MG tablet  Commonly known as:  REGLAN      TAKE these medications       albuterol 108 (90 BASE) MCG/ACT inhaler  Commonly known as:  PROVENTIL HFA;VENTOLIN HFA  Inhale 2 puffs into the lungs every 6 (six) hours as needed for wheezing or shortness of breath.     aspirin EC 81 MG tablet  Take 1 tablet (81 mg total) by mouth daily.     atorvastatin 40 MG tablet  Commonly known as:  LIPITOR  Take 1 tablet (40 mg total) by mouth daily at 6 PM.     citalopram 20 MG tablet  Commonly known as:  CELEXA  Take 1 tablet (20 mg total) by mouth daily.     furosemide 20 MG tablet  Commonly known as:  LASIX  Take 10 mg by mouth daily as needed for edema.     HYDROcodone-acetaminophen 5-325 MG per tablet  Commonly known as:  NORCO/VICODIN  Take 1 tablet by mouth daily as needed (for severe headache).     hydrOXYzine 25 MG tablet  Commonly known as:  ATARAX/VISTARIL  Take 50 mg by mouth every 4 (four) hours as needed for anxiety.     lansoprazole 30 MG capsule  Commonly known as:  PREVACID  Take 1 capsule (30 mg total) by mouth daily at 12 noon.     metoprolol succinate 50 MG 24 hr tablet  Commonly known as:  TOPROL-XL  Take 50 mg by mouth daily. Take with or immediately following a meal.     nortriptyline 25 MG capsule  Commonly known as:  PAMELOR  Take 50 mg by mouth at bedtime.     promethazine 12.5 MG tablet  Commonly known as:  PHENERGAN  Take 1 tablet (12.5 mg total) by mouth every 6 (six) hours as needed for nausea.     traMADol 50 MG tablet   Commonly known as:  ULTRAM  Take 1 tablet (50 mg total) by mouth every 6 (six) hours as needed for moderate pain or severe pain.     traMADol 50 MG tablet  Commonly known as:  ULTRAM  Take 1 tablet (50 mg total) by mouth every 6 (six) hours as needed for moderate pain.        Disposition and follow-up:   Sylvia Hall [facility] Fair [facility].  At the hospital follow up visit please address:  1.  Please readdress high cholesterol. Pt would like to try lifestyle modifications prior to lipitor. Also ensure patient is eating well, exercising and getting adequate sleep as that is what neurology recommends for resolution of HA.   2.  Labs / imaging needed at time of follow-up: LFTs  3.  Pending labs/ test needing follow-up: none   Consultations:  Neurology  Procedures Performed:  Ct Abdomen Pelvis Wo Contrast  11/26/2013   CLINICAL DATA:  Abdominal pain.  Allergic to IV contrast material.  EXAM: CT ABDOMEN AND PELVIS WITHOUT CONTRAST  TECHNIQUE: Multidetector CT imaging of the abdomen and  pelvis was performed following the standard protocol without IV contrast.  COMPARISON:  Ultrasound abdomen 11/1913.  FINDINGS: Dependent changes in the lung bases.  Diffuse fatty infiltration of the liver. There is a focal circumscribed low-attenuation lesion centrally in the liver measuring 1.5 cm diameter. This is indeterminate, but most likely to represent a cyst or hemangioma. Definitive characterization is not possible without IV contrast material. Recommend follow-up CT or MRI in 6 months. The unenhanced appearance of the gallbladder, pancreas, spleen, adrenal glands, kidneys, abdominal aorta, inferior vena cava, and retroperitoneal lymph nodes is unremarkable. Stomach and small bowel are normal in appearance. Scattered stool in the colon without distention. No free air or free fluid in the abdomen.  Pelvis: Uterus and ovaries are not enlarged. Bladder wall is  not thickened. No free or loculated pelvic fluid collections. No pelvic mass or lymphadenopathy. Surgical absence of the appendix. Degenerative changes in the lumbar spine. No destructive bone lesions appreciated.  IMPRESSION: No acute process demonstrated in the abdomen or pelvis. 1.5 cm diameter indeterminate liver lesion. Recommend six-month follow-up with CT or MRI. Diffuse fatty infiltration of the liver.   Electronically Signed   By: Burman Nieves M.D.   On: 11/26/2013 04:08   Dg Chest 2 View  12/01/2013   CLINICAL DATA:  Asthma. History of mitral valve prolapse. Patient described shortness of breath for 2 days with cough. History of hypertension and smoking.  EXAM: CHEST  2 VIEW  COMPARISON:  05/26/1938  FINDINGS: Heart, mediastinum hila are unremarkable.  Lungs are clear.  No pleural effusion or pneumothorax.  Bony thorax is demineralized but intact.  IMPRESSION: No active cardiopulmonary disease.   Electronically Signed   By: Amie Portland M.D.   On: 12/01/2013 09:22   Ct Head Wo Contrast  12/01/2013   CLINICAL DATA:  Patient woke this morning with left-sided tingling of the face and all arm. No facial droop.  EXAM: CT HEAD WITHOUT CONTRAST  TECHNIQUE: Contiguous axial images were obtained from the base of the skull through the vertex without intravenous contrast.  COMPARISON:  None.  FINDINGS: Ventricles are normal in size and configuration. There are no parenchymal masses or mass effect. There is no evidence of an infarct. There are no extra-axial masses or abnormal fluid collections.  No intracranial hemorrhage.  Sinuses and mastoid air cells are clear.  No skull lesion.  IMPRESSION: Normal unenhanced CT scan the brain.   Electronically Signed   By: Amie Portland M.D.   On: 12/01/2013 08:41   Mr Sylvia Hall Contrast  12/01/2013   CLINICAL DATA:  Numbness in the left cheek and nose below the IA. The sensation is similar to the anesthetic effect of dental work. Personal history of seizures.  Paresthesias are noted in the left upper and lower extremity as well. Difficulty walking. Headache and neck pain.  EXAM: MRI HEAD WITHOUT AND WITH CONTRAST  TECHNIQUE: Multiplanar, multiecho pulse sequences of the brain and surrounding structures were obtained without and with intravenous contrast.  CONTRAST:  17mL MULTIHANCE GADOBENATE DIMEGLUMINE 529 MG/ML IV SOLN  COMPARISON:  CT head without contrast 12/01/2013.  FINDINGS: The diffusion-weighted images demonstrate no evidence for acute or subacute infarction. No hemorrhage or mass lesion is evident. No significant white matter disease is evident. The ventricles are of normal size. No significant extraaxial fluid collection is present.  Flow is present in the major intracranial arteries. The globes and orbits are intact. Minimal mucosal thickening is present in a posterior right ethmoid air  cell. The paranasal sinuses and mastoid air cells are otherwise clear.  The postcontrast images demonstrate no pathologic enhancement.  IMPRESSION: Negative MRI of the brain without and with contrast. No acute or focal lesion to explain the patient's paresthesias and numbness.   Electronically Signed   By: Gennette Pachris  Mattern M.D.   On: 12/01/2013 16:01   [place] Abdomen Complete  11/26/2013   CLINICAL DATA:  53 year old female with acute right upper quadrant pain, with abdominal swelling bloating and reflux. Initial encounter.  EXAM: ULTRASOUND ABDOMEN COMPLETE  COMPARISON:  None.  FINDINGS: Gallbladder: No gallstones or wall thickening visualized. No sonographic Murphy sign noted.  Common bile duct: Diameter: 6 -7 mm, upper limits of normal.  Liver: Diffusely increased echogenicity. Small 17 mm hypoechoic area posterior to the gallbladder fossa most resembles a benign cyst (may have a single internal septation). No intrahepatic biliary ductal dilatation or other discrete hepatic lesion.  IVC: Not visualized due to overlying bowel gas.  Pancreas: Not visualized due to overlying  bowel gas.  Spleen: Size and appearance within normal limits.  Right Kidney: Length: 9.7 cm. Echogenicity within normal limits. No mass or hydronephrosis visualized.  Left Kidney: Length: 9.8 cm. Echogenicity within normal limits. No mass or hydronephrosis visualized.  Abdominal aorta: Incompletely visualized due to overlying bowel gas, visualized portions within normal limits.  Other findings: None.  IMPRESSION: 1. Hepatic steatosis. 2. Otherwise negative abdominal ultrasound (some viscera not well visualized due to overlying bowel gas).   Electronically Signed   By: Augusto GambleLee  Hall M.D.   On: 11/26/2013 01:25    2D Echo: Study Conclusions - Left ventricle: The cavity size was normal. Wall thickness was normal. Systolic function was normal. The estimated ejection fraction was in the range of 55% to 60%. Wall motion was normal; there were no regional wall motion abnormalities. Diastolic dysfunction present, grade indeterminate. Tissue Doppler was not performed, thus filling pressures could not be assessed. - Ventricular septum: Septal motion showed abnormal function and dyssynergy. These changes are consistent with intraventricular conduction delay. - Mitral valve: Mildly thickened leaflets    Carotid doppler: Findings suggest 1-39% internal carotid artery stenosis bilaterally. Vertebral arteries are patent with antegrade flow.   Admission HPI: Pt is a 53 y/o female w/ PMHx of anxiety, depression, Hx of tachycardia, tobacco abuse, and MVP who presents w/ left sided numbness. Pt woke up this morning around 5:00am and noticed left sided numbness. She denied facial droop and was able to speak in complete sentences. She took her morning meds around 6:00 and and her husband drove her to the ED. Pt states numbness started resolving w/in one hour however she is still having left sided facial numbness yet she points to the midline of her face when asked to localize numbness. Pt has never experienced anything  like this before. She has a hx of migraines, last migraine was in her 2220's.  Pt is a current smoker and has a 10 pack year hx. She denies HTN or diabetes. No family hx of stroke. She does not drink EtOH or use illicit drugs.   Hospital Course by problem list: Principal Problem:   Left sided numbness Active Problems:   MVP (mitral valve prolapse)   Tobacco user   Occipital neuralgia: DDx complex migraine migraine and TIA. ABCD scale 4. MRI brain and CT of head negative. MRA revealed irregularities in her middle cerebral artery and A2 segment anterior cerebral artery branch vessel. Pt has hx of migraines in her 20's. Neurology saw  patient and performed b/l occipital nerve block with improvement of symptoms. Neurology referred pt to pain clinic for further management of her occipital neuralgia as the occipital nerve block could last 2 days to 2 months.  Pt had a HA throughout her hospital course that resolved with hydrocodone. She states that ultram helped take edge off HA, tylenol and ibuprofen did not help. LDL 135, HbA1c 5.1, TSH WNL, HIV negative. Pt d/c'd home w/ lipitor 40mg  although pt would like to try lifestyle modifications before starting lipitor daily at home. Instructed to take aspirin 81 mg daily on d/c.    Discharge Vitals:   BP 116/71  Pulse 87  Temp(Src) 98.4 F (36.9 C) (Oral)  Resp 18  Ht 5\' 3"  (1.6 m)  Wt 180 lb (81.647 kg)  BMI 31.89 kg/m2  SpO2 97%  Discharge Labs:  Results for SHACARA, COZINE (MRN [id number]) as of 12/05/2013 11:32  Ref. Range 12/02/2013 05:20  Sodium Latest Range: 137-147 mEq/L 138  Potassium Latest Range: 3.7-5.3 mEq/L 3.9  Chloride Latest Range: 96-112 mEq/L 101  CO2 Latest Range: 19-32 mEq/L 26  BUN Latest Range: 6-23 mg/dL 17  Creatinine Latest Range: 0.50-1.10 mg/dL 4.09  Calcium Latest Range: 8.4-10.5 mg/dL 9.4  GFR calc non Af Amer Latest Range: >90 mL/min 62 (L)  GFR calc Af Amer Latest Range: >90 mL/min 72 (L)  Glucose Latest Range:  70-99 mg/dL 811 (H)  Anion gap Latest Range: 5-15  11  WBC Latest Range: 4.6-10.2 K/uL 4.5  RBC Latest Range: 4.04-5.48 M/uL 4.01  Hemoglobin Latest Range: 12.0-15.0 g/dL 91.4  HCT Latest Range: 36.0-46.0 % 38.0  MCV Latest Range: 80-97 fL 94.8  MCH Latest Range: 27-31.2 pg 32.7  MCHC Latest Range: 31.8-35.4 g/dL 78.2  RDW Latest Range: 11.5-15.5 % 12.1  Platelets Latest Range: 150-400 K/uL 161    Signed: Gara Kroner, MD 12/05/2013, 11:34 AM    Services Ordered on Discharge: none Equipment Ordered on Discharge: none

## 2013-12-02 NOTE — H&P (Signed)
  Date: 12/02/2013  Patient name: Sylvia DesanctisBarbara Hall  Medical record number: [id number]  Date of birth: 05/31/1960   I have seen and evaluated Sylvia DesanctisBarbara Hall and discussed their care with the Residency Team.  Briefly, [name] is a 53yo woman with PMH of anxiety, depression, sinus tachycardia, tobacco abuse who presented with left sided numbness.  She woke up on the day of admission at 5am with left sided numbness in her arm and leg and some placed on her face.  Her symptoms lasted about into the 6am hour and she presented to the ED.  She did note her symptoms had started resolving.  She further has a bilateral frontal headache with neck pain as well, tenderness to palpation over her sinuses, congestion, photophobia only in her right eye. She did not have any muscle weakness.  When I asked her, she stated that her headache started before her symptoms of nubmness and she has been having trouble with insomnia with only about 3 hours of sleep in the last few days.   Assessment and Plan: I have seen and evaluated the patient as outlined above. I agree with the formulated Assessment and Plan as detailed in the residents' admission note, with the following changes:   1. Left sided numbness - She has been worked up for TIA.  Symptoms had improved on admission, however, she continued to have very focal areas of numbness which have not resolved.  Differential could include TIA, atypical migraines (headache started prior to symptoms), anxiety/insomnia-related - Agree with work up for TIA and neurology consult - Treat headache with tylenol and ibuprofen, which is what she takes at home.   2. Mildly elevated transaminases - Would trend, avoid tylenol if continue to be elevated  Other issues per resident note.   Inez CatalinaEmily B Cherryl Babin, MD 10/25/20159:50 AM

## 2013-12-02 NOTE — Progress Notes (Signed)
  Echocardiogram 2D Echocardiogram has been performed.  Arvil ChacoFoster, Samiksha Pellicano 12/02/2013, 3:16 PM

## 2013-12-02 NOTE — Progress Notes (Signed)
STROKE TEAM PROGRESS NOTE   HISTORY Sylvia Hall is an 53 y.o. female with a past medical history significant for depression, anxiety, PTSD, MVP, asthma, lumbar spine stenosis, and seizures (last seizure was she was in high school)ad[facility] with new onset left sided numbness-tingling.  Stated that she never had similar symptoms before.  Patient stated that she went to bed around 3 am today and everything was fine. Woke up at 5 am with a numb-tingly-pins and needles sensation involving left face-arm and leg. She said that she had trouble walking and has been having a HA-neck pain since yesterday, but denies vertigo, double vision, difficulty swallowing, confusion, disorientation, slurred speech, focal weakness, language or vision impairment.  Sylvia Hall said that these symptoms are improving but still has numbness of the left nostril-left forehead-left side of the mouth and a small area of the left leg.  CT brain showed no acute intracranial abnormality.    Date last known well: 12/01/13  Time last known well: 3 am  tPA Given: no, symptoms virtually resolved  NIHSS: 1    SUBJECTIVE (INTERVAL HISTORY) The patient's husband is present. The patient reports that her numbness is almost resolved. Her headache has not improved. She has significant tenderness in the neck and occipital area. She has a remote history of migraine headaches 12 years ago.    OBJECTIVE Temp:  [97.7 F (36.5 C)-98.4 F (36.9 C)] 97.9 F (36.6 C) (10/25 0200) Pulse Rate:  [74-91] 91 (10/25 0257) Cardiac Rhythm:  [-] Normal sinus rhythm (10/24 2000) Resp:  [11-20] 18 (10/24 2200) BP: (81-134)/(44-117) 130/85 mmHg (10/25 0257) SpO2:  [94 %-99 %] 98 % (10/25 0200)   Recent Labs Lab 12/01/13 0654  GLUCAP 101*    Recent Labs Lab 11/25/13 1930 12/01/13 0655 12/02/13 0520  NA 137 138 138  K 4.0 3.8 3.9  CL 98 99 101  CO2 24 26 26   GLUCOSE 107* 109* 105*  BUN 13 12 17   CREATININE 0.98 0.99 1.01   CALCIUM 9.5 9.2 9.4    Recent Labs Lab 11/25/13 1930 12/01/13 0655  AST 87* 55*  ALT 122* 85*  ALKPHOS 113 105  BILITOT 0.5 0.4  PROT 8.0 7.3  ALBUMIN 4.5 4.0    Recent Labs Lab 11/25/13 1930 12/01/13 0655 12/02/13 0520  WBC 5.3 4.6 4.5  NEUTROABS 3.3 1.9  --   HGB 15.1* 14.0 13.1  HCT 43.7 40.4 38.0  MCV 94.0 94.6 94.8  PLT 168 147* 161   No results found for this basename: CKTOTAL, CKMB, CKMBINDEX, TROPONINI,  in the last 168 hours No results found for this basename: LABPROT, INR,  in the last 72 hours No results found for this basename: COLORURINE, APPERANCEUR, LABSPEC, PHURINE, GLUCOSEU, HGBUR, BILIRUBINUR, KETONESUR, PROTEINUR, UROBILINOGEN, NITRITE, LEUKOCYTESUR,  in the last 72 hours     Component Value Date/Time   CHOL 225* 12/01/2013 1615   TRIG 116 12/01/2013 1615   HDL 67 12/01/2013 1615   CHOLHDL 3.4 12/01/2013 1615   VLDL 23 12/01/2013 1615   LDLCALC 135* 12/01/2013 1615   Lab Results  Component Value Date   HGBA1C 5.1 12/01/2013   No results found for this basename: labopia, cocainscrnur, labbenz, amphetmu, thcu, labbarb    No results found for this basename: ETH,  in the last 168 hours   2 D Echo 12/02/2013 Left ventricle: The cavity size was normal. Wall thickness was normal. Systolic function was normal. The estimated ejection fraction was in the range of 55%  to 60%. Wall motion was normal; there were no regional wall motion abnormalities. Diastolic dysfunction present, grade indeterminate. Tissue Doppler was not performed, thus filling pressures could not be assessed. - Ventricular septum: Septal motion showed abnormal function and dyssynergy. These changes are consistent with intraventricular conduction delay. - Mitral valve: Mildly thickened leaflets .  Dg Chest 2 View 12/01/2013    No active cardiopulmonary disease.    Ct Head Wo Contrast 12/01/2013    Normal unenhanced CT scan the brain.     Mr Laqueta JeanBrain W Wo  Contrast 12/01/2013    Negative MRI of the brain without and with contrast. No acute or focal lesion to explain the patient's paresthesias and numbness.    PHYSICAL EXAM Physical exam  Temp:  [97.8 F (36.6 C)-98.6 F (37 C)] 98.2 F (36.8 C) (10/26 0551) Pulse Rate:  [80-109] 107 (10/26 0551) Resp:  [16-20] 18 (10/26 0551) BP: (97-130)/(62-87) 130/80 mmHg (10/26 0551) SpO2:  [96 %-99 %] 97 % (10/26 0551)  General - Well nourished, well developed, in no apparent distress.  Ophthalmologic - not cooperative on exam.  Cardiovascular - Regular rate and rhythm with no murmur.  Neck - supple, but significant tenderness on palpation of bilateral occipital greater nerves.  Mental Status -  Level of arousal and orientation to time, place, and person were intact. Language including expression, naming, repetition, comprehension was assessed and found intact.  Cranial Nerves II - XII - II - Visual field intact OU. III, IV, VI - Extraocular movements intact. V - Facial sensation intact bilaterally. VII - Facial movement intact bilaterally. VIII - Hearing & vestibular intact bilaterally. X - Palate elevates symmetrically. XI - Chin turning & shoulder shrug intact bilaterally. XII - Tongue protrusion intact.  Motor Strength - The patient's strength was normal in all extremities and pronator drift was absent.  Bulk was normal and fasciculations were absent.   Motor Tone - Muscle tone was assessed at the neck and appendages and was normal.  Reflexes - The patient's reflexes were normal in all extremities and she had no pathological reflexes.  Sensory - Light touch, temperature/pinprick were assessed and were normal.    Coordination - The patient had normal movements in the hands and feet with no ataxia or dysmetria.  Tremor was absent.  Gait and Station - not tested.   ASSESSMENT/PLAN Sylvia Hall is a 53 y.o. female with history of anxiety, depression, PTSD, mitral valve  prolapse, lumbar spine stenosis, and seizure disorder presenting with left-sided numbness and tingling. Her symptoms are consistent with occipital neuralgia. However, she does have risk factor of high LDL for stroke. She did not receive IV t-PA as her deficits were resolving.   Occipital neuralgia with transient hemiparesthesia   MRI  negative for acute findings.  MRA  not ordered  Carotid Doppler  pending  2D Echo - ejection fraction 55-60%. No cardiac source of emboli identified.  LDL - 135  HgbA1c - 5.1  Symptoms improved, if HA continues, she may need occipital nerve block  Lovenox for VTE prophylaxis  General diet with thin liquids  Up with assistance  No antithrombotics prior to admission, now on aspirin 325 mg orally every day  Patient counseled to be compliant with her antithrombotic medications  Risk factor education  Ongoing aggressive risk factor management  Resultant - significant improvement in deficits but continued headache.  Therapy recommendations:  Pending  Disposition:  Pending  Hypertension  Home meds: - Toprol-XL 50 mg daily - continued  on admission.  Hypotensive at times - may need to reduce Toprol dose.  BP goal normotensive  Hyperlipidemia  Home meds: - No lipid-lowering medications prior to admission.  LDL - 135 not at goal < 70  Lipitor 20 mg daily started  Continue statin at discharge  Other Stroke Risk Factors Cigarette smoker, advised to stop smoking Obesity, Body mass index is 31.89 kg/(m^2).   Other Active Problems  Mildly elevated liver function tests  Hospital day # 1  Delton See PA-C [facility] Pager [phone] 12/02/2013, 9:11 AM  I, the attending vascular neurologist, have personally obtained a history, examined the patient, evaluated laboratory data, individually viewed imaging studies, and formulated the assessment and plan of care.  I have made any additions or clarifications directly  to the above note and agree with the findings and plan as currently documented.   Please note: All text in blue color in the note is my addition to the original note.    Marvel Plan, MD PhD Stroke Neurology 12/03/2013 7:13 AM     To contact Stroke Continuity provider, please refer to [email or web address]. After hours, contact General Neurology

## 2013-12-03 ENCOUNTER — Observation Stay (HOSPITAL_COMMUNITY): Payer: Self-pay

## 2013-12-03 DIAGNOSIS — G459 Transient cerebral ischemic attack, unspecified: Secondary | ICD-10-CM

## 2013-12-03 LAB — TROPONIN I

## 2013-12-03 MED ORDER — DEXAMETHASONE SODIUM PHOSPHATE 4 MG/ML IJ SOLN
8.0000 mg | Freq: Once | INTRAMUSCULAR | Status: AC
  Administered 2013-12-03: 8 mg via INTRAMUSCULAR
  Filled 2013-12-03: qty 2

## 2013-12-03 MED ORDER — LIDOCAINE HCL (PF) 1 % IJ SOLN
10.0000 mL | Freq: Once | INTRAMUSCULAR | Status: AC
  Administered 2013-12-03: 10 mL via INTRADERMAL
  Filled 2013-12-03: qty 10

## 2013-12-03 NOTE — Progress Notes (Signed)
STROKE TEAM PROGRESS NOTE   HISTORY Sylvia DesanctisBarbara Hall is an 53 y.o. female with a past medical history significant for depression, anxiety, PTSD, MVP, asthma, lumbar spine stenosis, and seizures (last seizure was she was in high school)[facility] with new onset left sided numbness-tingling.  Stated that she never had similar symptoms before. Patient stated that she went to bed around 3 am today 12/01/2013 and everything was fine. Woke up at 5 am with a numb-tingly-pins and needles sensation involving left face-arm and leg. She said that she had trouble walking and has been having a HA-neck pain since yesterday, but denies vertigo, double vision, difficulty swallowing, confusion, disorientation, slurred speech, focal weakness, language or vision impairment. [name] said that these symptoms are improving but still has numbness of the left nostril-left forehead-left side of the mouth and a small area of the left leg. CT brain showed no acute intracranial abnormality. tPA Given: no, symptoms virtually resolved. NIHSS: 1   SUBJECTIVE (INTERVAL HISTORY) The patient's husband is present. Patient has been crying some today due to pain. She had another episode of headache with left sided numbness with chest pain. She had CT head no acute changes. EKG unremarkable and cardiac enzyme negative. The numbness much resolved now but headache continued. Her HA is at skull base with tenderness on touch at occipital nerves, consistent with bilateral occipital neuralgia. Will do occipital nerve blockade.  OBJECTIVE  Recent Labs Lab 12/01/13 0654  GLUCAP 101*    Recent Labs Lab 12/01/13 0655 12/02/13 0520  NA 138 138  K 3.8 3.9  CL 99 101  CO2 26 26  GLUCOSE 109* 105*  BUN 12 17  CREATININE 0.99 1.01  CALCIUM 9.2 9.4    Recent Labs Lab 12/01/13 0655 12/02/13 1100  AST 55* 43*  ALT 85* 70*  ALKPHOS 105 100  BILITOT 0.4 0.5  PROT 7.3 7.3  ALBUMIN 4.0 4.0    Recent Labs Lab 12/01/13 0655  12/02/13 0520  WBC 4.6 4.5  NEUTROABS 1.9  --   HGB 14.0 13.1  HCT 40.4 38.0  MCV 94.6 94.8  PLT 147* 161    Recent Labs Lab 12/03/13 0513  TROPONINI <0.30       Component Value Date/Time   CHOL 225* 12/01/2013 1615   TRIG 116 12/01/2013 1615   HDL 67 12/01/2013 1615   CHOLHDL 3.4 12/01/2013 1615   VLDL 23 12/01/2013 1615   LDLCALC 135* 12/01/2013 1615   Lab Results  Component Value Date   HGBA1C 5.1 12/01/2013   2 D Echo 12/02/2013 Left ventricle: The cavity size was normal. Wall thickness was normal. Systolic function was normal. The estimated ejection fraction was in the range of 55% to 60%. Wall motion was normal; there were no regional wall motion abnormalities. Diastolic dysfunction present, grade indeterminate. Tissue Doppler was not performed, thus filling pressures could not be assessed. - Ventricular septum: Septal motion showed abnormal function and dyssynergy. These changes are consistent with intraventricular conduction delay. - Mitral valve: Mildly thickened leaflets .  Dg Chest 2 View 12/01/2013    No active cardiopulmonary disease.    Ct Head Wo Contrast 12/01/2013    Normal unenhanced CT scan the brain.     Mr Laqueta JeanBrain W Wo Contrast 12/01/2013    Negative MRI of the brain without and with contrast. No acute or focal lesion to explain the patient's paresthesias and numbness.   CUS - Bilateral: 1-39% ICA stenosis. Vertebral artery flow is antegrade.  MRA head -  pending  PHYSICAL EXAM Temp:  [97.8 F (36.6 C)-98.6 F (37 C)] 98 F (36.7 C) (10/26 1015) Pulse Rate:  [80-109] 89 (10/26 1015) Resp:  [16-20] 19 (10/26 1015) BP: (98-130)/(64-87) 121/79 mmHg (10/26 1015) SpO2:  [95 %-99 %] 95 % (10/26 1015)  General - Well nourished, well developed, in no apparent distress.  Ophthalmologic - not cooperative on exam.  Cardiovascular - Regular rate and rhythm with no murmur.  Neck - supple, but significant tenderness on palpation of bilateral  occipital greater nerves.  Mental Status -  Level of arousal and orientation to time, place, and person were intact. Language including expression, naming, repetition, comprehension was assessed and found intact.  Cranial Nerves II - XII - II - Visual field intact OU. III, IV, VI - Extraocular movements intact. V - Facial sensation intact bilaterally. VII - Facial movement intact bilaterally. VIII - Hearing & vestibular intact bilaterally. X - Palate elevates symmetrically. XI - Chin turning & shoulder shrug intact bilaterally. XII - Tongue protrusion intact.  Motor Strength - The patient's strength was normal in all extremities and pronator drift was absent.  Bulk was normal and fasciculations were absent.   Motor Tone - Muscle tone was assessed at the neck and appendages and was normal.  Reflexes - The patient's reflexes were normal in all extremities and she had no pathological reflexes.  Sensory - Light touch, temperature/pinprick were assessed and were normal.    Coordination - The patient had normal movements in the hands and feet with no ataxia or dysmetria.  Tremor was absent.  Gait and Station - not tested.   ASSESSMENT/PLAN [name] is a 53 y.o. female with history of anxiety, depression, PTSD, mitral valve prolapse, lumbar spine stenosis, and seizure disorder presenting with left-sided numbness and tingling. Her symptoms are consistent with occipital neuralgia. However, she does have risk factor of high LDL for stroke.   Occipital neuralgia with transient hemiparesthesia   MRI  negative for acute findings.  MRA  pending   Carotid Doppler - unremarkable  2D Echo - ejection fraction 55-60%. No cardiac source of emboli identified.  HgbA1c - 5.1  Will do occipital nerve block  Lovenox for VTE prophylaxis  General diet with thin liquids  Up with assistance  No antithrombotics prior to admission, now on aspirin 325 mg orally every day  Resultant -  continued headache.  Plan occipital nerve block today by Dr. Roda ShuttersXu. This should immediately resolve her pain.  Disposition:  home  Hypertension  Home meds: - Toprol-XL 50 mg daily - continued on admission.  Hypotensive at times - may need to reduce Toprol dose.  BP goal normotensive  Hyperlipidemia  Home meds: - No lipid-lowering medications prior to admission.  LDL - 135 not at goal   Lipitor 20 mg daily started  Continue statin at discharge  Stroke Risk Factors hypertension Hyperlipidemia Cigarette smoker, advised to stop smoking Obesity, Body mass index is 31.89 kg/(m^2).   Other Active Problems  Mildly elevated liver function tests  Hospital day # 2  Annie MainSHARON BIBY, MSN, RN, ANVP-BC, ANP-BC, Lawernce IonGNP-BC [facility] Pager: [phone] 12/03/2013 1:58 PM   I, the attending vascular neurologist, have personally obtained a history, examined the patient, evaluated laboratory data, individually viewed imaging studies, and formulated the assessment and plan of care.  I have made any additions or clarifications directly to the above note and agree with the findings and plan as currently documented.   Neurology will sign off.  Please call with questions. Thanks for the consult.  Marvel Plan, MD PhD Stroke Neurology 12/03/2013 6:15 PM     To contact Stroke Continuity provider, please refer to [email or web address]. After hours, contact General Neurology

## 2013-12-03 NOTE — Progress Notes (Signed)
Subjective: Pt had left sided numbness that started overnight and resolved this morning except for some left sided cheek numbness. Pt is still having a HA that has not resolved w/ tylenol and ibuprofen. Pt was able to have a BM yesterday and denies any abdominal pain.   Overnight tele negative  Objective: Vital signs in last 24 hours: Filed Vitals:   12/03/13 0400 12/03/13 0551 12/03/13 0600 12/03/13 1015  BP:  130/80  121/79  Pulse:  107  89  Temp:  98.2 F (36.8 C)  98 F (36.7 C)  TempSrc:  Oral  Oral  Resp: 17 18 20 19   Height:      Weight:      SpO2:  97%  95%   Physical Exam General: NAD HEENT: tender to percussion of all sinuses, tender to palpation of temples, unable to track fingers 2/2 photophobia Pulm: CTAB Cardiac: RRR GI: active bowel sounds, tender to palpation of RUQ, no masses felt Neuro: CN 2-12 grossly intact, moving all extremities, symmetric smile, equal strength in UE and LE b/l  Lab Results: Basic Metabolic Panel:  Recent Labs Lab 12/01/13 0655 12/02/13 0520  NA 138 138  K 3.8 3.9  CL 99 101  CO2 26 26  GLUCOSE 109* 105*  BUN 12 17  CREATININE 0.99 1.01  CALCIUM 9.2 9.4   Liver Function Tests:  Recent Labs Lab 12/01/13 0655 12/02/13 1100  AST 55* 43*  ALT 85* 70*  ALKPHOS 105 100  BILITOT 0.4 0.5  PROT 7.3 7.3  ALBUMIN 4.0 4.0   CBC:  Recent Labs Lab 12/01/13 0655 12/02/13 0520  WBC 4.6 4.5  NEUTROABS 1.9  --   HGB 14.0 13.1  HCT 40.4 38.0  MCV 94.6 94.8  PLT 147* 161   CBG:  Recent Labs Lab 12/01/13 0654  GLUCAP 101*   Hemoglobin A1C:  Recent Labs Lab 12/01/13 1615  HGBA1C 5.1   Fasting Lipid Panel:  Recent Labs Lab 12/01/13 1615  CHOL 225*  HDL 67  LDLCALC 135*  TRIG 116  CHOLHDL 3.4   Thyroid Function Tests:  Recent Labs Lab 12/01/13 1615  TSH 1.610    Studies/Results: Ct Head Wo Contrast  12/03/2013   CLINICAL DATA:  Initial valuation for left-sided weakness and numbness.  EXAM: CT  HEAD WITHOUT CONTRAST  TECHNIQUE: Contiguous axial images were obtained from the base of the skull through the vertex without intravenous contrast.  COMPARISON:  Prior MRI from 12/01/2013  FINDINGS: There is no acute intracranial hemorrhage or infarct. No mass lesion or midline shift. Gray-white matter differentiation is well maintained. Ventricles are normal in size without evidence of hydrocephalus. CSF containing spaces are within normal limits. No extra-axial fluid collection.  The calvarium is intact.  Orbital soft tissues are within normal limits.  The paranasal sinuses and mastoid air cells are well pneumatized and free of fluid.  Scalp soft tissues are unremarkable.  IMPRESSION: No acute intracranial process.   Electronically Signed   By: Rise MuBenjamin  McClintock M.D.   On: 12/03/2013 05:26   Mr Laqueta JeanBrain W GNWo Contrast  12/01/2013   CLINICAL DATA:  Numbness in the left cheek and nose below the IA. The sensation is similar to the anesthetic effect of dental work. Personal history of seizures. Paresthesias are noted in the left upper and lower extremity as well. Difficulty walking. Headache and neck pain.  EXAM: MRI HEAD WITHOUT AND WITH CONTRAST  TECHNIQUE: Multiplanar, multiecho pulse sequences of the brain and surrounding structures were  obtained without and with intravenous contrast.  CONTRAST:  17mL MULTIHANCE GADOBENATE DIMEGLUMINE 529 MG/ML IV SOLN  COMPARISON:  CT head without contrast 12/01/2013.  FINDINGS: The diffusion-weighted images demonstrate no evidence for acute or subacute infarction. No hemorrhage or mass lesion is evident. No significant white matter disease is evident. The ventricles are of normal size. No significant extraaxial fluid collection is present.  Flow is present in the major intracranial arteries. The globes and orbits are intact. Minimal mucosal thickening is present in a posterior right ethmoid air cell. The paranasal sinuses and mastoid air cells are otherwise clear.  The  postcontrast images demonstrate no pathologic enhancement.  IMPRESSION: Negative MRI of the brain without and with contrast. No acute or focal lesion to explain the patient's paresthesias and numbness.   Electronically Signed   By: Gennette Pachris  Mattern M.D.   On: 12/01/2013 16:01   Medications: I have reviewed the patient's current medications. Scheduled Meds: . aspirin  325 mg Oral Daily  . atorvastatin  40 mg Oral q1800  . citalopram  20 mg Oral Daily  . enoxaparin (LOVENOX) injection  40 mg Subcutaneous Q24H  . metoprolol succinate  50 mg Oral Daily  . nortriptyline  50 mg Oral QHS  . pantoprazole  40 mg Oral Daily   Continuous Infusions:  PRN Meds:.albuterol, hydrOXYzine, ibuprofen, promethazine, traMADol Assessment/Plan: Principal Problem:   Left sided numbness Active Problems:   MVP (mitral valve prolapse)   Tobacco user   Left sided numbness:  - MRI and CT of head negative, ECHO negative for source of emboli, pending carotid dopplers and MRA brain - neurology following, will proceed with occipital nerve block for likely occipital neuralgia that are the cause of pt's symptoms - LDL elevated, pt does not want to be started on statin, informed will give prescription for lipitor if she changes her mind, will continue statin while inpatient - neuro checks q4h  - tramadol for HAs  Gastroenteritis- pt last seen in the ED for this on 10/18 at which time CT abd/pelvis which was negative for any acute process, and u/s of abd noted hepatic steatosis. She was d/c'd home with a PPI and reglan.  - d/c'd reglan as not a good tx for this problem, can give phenergan for nausea.  - senokot  - protonix   Hx of tachycardia-- HR on admission 108, now 89. on home toprol 50mg  QD, BP stable  - continue home med   Insomnia-- on home nortriptyline 50mg  qhs  - continue home med   Depression  - continue home med celexa   FEN:  - lovenox  - regular diet   Dispo: Disposition is deferred at this  time, awaiting improvement of current medical problems. Anticipated discharge in approximately 1-2 day(s) .  The patient does not have a current PCP (No Pcp Per Patient) and [facility] after [facility].   The patient does not have transportation limitations that hinder transportation to clinic appointments.    LOS: 2 days   Gara [place] Dohmen, MD 12/03/2013, 11:29 AM

## 2013-12-03 NOTE — Procedures (Signed)
Procedure Date:  12/03/13 Procedure: Occipital Nerve Block, bi  Pre-procedure Diagnosis: Headache  Post-procedure Diagnosis: same as above  Prior to Procedure:  Informed Consent: The risks, benefits, indications, potential complications, and alternatives were explained to the patient/family and informed consent obtained.  Attending Staff:  Marvel PlanJindong Gracee Ratterree, MD PhD Skin Prep: Cleansed with alcohol.  Anesthesia: 4ml Lidocaine 1% without epinephrine without added sodium bicarbonate, decadron 1ml (4mg ), for each side Indications: Sylvia DesanctisBarbara Hall is a 53 y.o. female here for headache. The identity of the patient was confirmed and a bedside time out was performed.  Description of Procedure: Pt's skin was prepped with alcohol and lidocaine 1% and decadron 4mg /ml was injected over the occipital ridge in a fan-like fashion with appropriate procedure bilaterally. Pt had immediate relief.  Findings: immediate HA relief  Complications: The patient tolerated the procedure well with no complications.  Specimens:none  Estimated blood loss: zero

## 2013-12-03 NOTE — Progress Notes (Signed)
Patient c/o numbness throughout face and stating that it feels like something is on her chest. Patient is also complaining of numbness in hands but can perfectly hold a cup and take her pills from her hands.  Paged Criselda PeachesMullen via Amion. Will continue to monitor.

## 2013-12-03 NOTE — Progress Notes (Signed)
  Date: 12/03/2013  Patient name: Sylvia DesanctisBarbara Hall  Medical record number: [id number]  Date of birth: 07/06/1960   This patient has been seen and the plan of care was discussed with the house staff. Please see their note for complete details. I concur with their findings with the following additions/corrections:  Neurology following, concerned for occipital neuralgia and may try occipital nerve block.  Further imaging is still pending.  The team will follow up after nerve block.   Inez CatalinaEmily B Larenzo Caples, MD 12/03/2013, 1:01 PM

## 2013-12-03 NOTE — Progress Notes (Signed)
UR completed 

## 2013-12-03 NOTE — Progress Notes (Signed)
Nursing called to notify the team that Sylvia Hall was experiencing acute onset diffuse facial numbness with weakness, numbness, and tingling in the LU&LE and chest pressure. However, per nursing, no focal deficits were present on her examination. The nurse did call Neurology at onset of the patient's symptoms who recommended a STAT CT head, which was ordered by nursing. MRI brain from 10/24 was w/o acute findings. EKG was sinus rhythm, with prolonged QTc.   On my arrival to the patient's room, she was still hooked up to the EKG machine. Vital signs WNL. The patient stated that her weakness, numbness, and tingling were beginning to improve, but that her head was continuing to hurt, primary in the forehead and top of the head, radiating into the back of her head into the neck. She also endorsed seeing bright lights from her right eye when the numbness returned.   On exam: Eyes open widely, posterior cervical spine with tenderness to light touch extending into the posterior head and into the forehead.  RRR, no m/r/g CTAB No peripheral edema, moving all 4 extremities.  A&Ox4, CN 2-12 grossly intact. +photophobia with right eye. 5/5 strength in bilateral upper and lower extremities. Sensation normal and equal in BUE and RLE, but significantly decreased in the LLE distal to the knee- this is present even with strong pressure. She is easily able to move all 4 extremities. Coordination with the left hand is diminished when trying to pronate and supinate the hand; finger to nose is completely normal. She appears to have full ROM of the neck but does seem to have muscle spasms in her neck during the examination, which are causing pain. Anxious  TIA vs atypical migraines vs psychogenic symptoms: Exam without consistent focal findings. She is anxious on exam, which could be contributing to her symptoms. The headaches and photophobia are more consistent with migraines, and could help explain the inconsistent exam  findings. Neurology recommends STAT CT head which has been ordered and is pending. Will continue work up for TIA/CVA and see what the CT head shows. Will f/u with Neurology for addition recommendations.

## 2013-12-03 NOTE — Progress Notes (Signed)
*  PRELIMINARY RESULTS* Vascular Ultrasound Carotid Duplex (Doppler) has been completed.   Findings suggest 1-39% internal carotid artery stenosis bilaterally. Vertebral arteries are patent with antegrade flow.  12/03/2013 5:08 PM Gertie FeyMichelle Rylie [place], RVT, RDCS, RDMS

## 2013-12-04 MED ORDER — ASPIRIN EC 81 MG PO TBEC: 81.0000 mg | DELAYED_RELEASE_TABLET | Freq: Every day | ORAL | Status: DC

## 2013-12-04 MED ORDER — ASPIRIN 81 MG PO CHEW
81.0000 mg | CHEWABLE_TABLET | Freq: Once | ORAL | Status: AC
  Administered 2013-12-04: 81 mg via ORAL
  Filled 2013-12-04: qty 1

## 2013-12-04 MED ORDER — HYDROCODONE-ACETAMINOPHEN 5-325 MG PO TABS
1.0000 | ORAL_TABLET | Freq: Once | ORAL | Status: AC
  Administered 2013-12-04: 1 via ORAL
  Filled 2013-12-04: qty 1

## 2013-12-04 MED ORDER — HYDROCODONE-ACETAMINOPHEN 5-325 MG PO TABS: 1.0000 | ORAL_TABLET | Freq: Every day | ORAL | Status: DC | PRN

## 2013-12-04 MED ORDER — TRAMADOL HCL 50 MG PO TABS: 50.0000 mg | ORAL_TABLET | Freq: Four times a day (QID) | ORAL | Status: DC | PRN

## 2013-12-04 MED ORDER — PROMETHAZINE HCL 12.5 MG PO TABS
12.5000 mg | ORAL_TABLET | Freq: Four times a day (QID) | ORAL | Status: DC | PRN
Start: 2013-12-04 — End: 2014-11-14

## 2013-12-04 NOTE — Discharge Instructions (Signed)
Start taking Aspirin 81mg  daily. As we discussed your cholesterol was elevated and we recommend starting a cholesterol medication - Lipitor for you that you can take once a day. You can take one hydrocodone once a day as needed for a severe headache.   Please make sure to follow up w/ a primary care physician, you can use the below list of numbers to local physicians in your area to help guide you with this.     Call your PCP or go to the ED if you notice unilateral numbness again, facial droop, or slurred speech.   Emergency Department Resource Guide 1) Find a Doctor and Pay Out of Pocket Although you won't have to find out who is covered by your insurance plan, it is a good idea to ask around and get recommendations. You will then need to call the office and see if the doctor you have chosen will accept you as a new patient and what types of options they offer for patients who are self-pay. Some doctors offer discounts or will set up payment plans for their patients who do not have insurance, but you will need to ask so you aren't surprised when you get to your appointment.  2) Contact Your Local Health Department Not all health departments have doctors that can see patients for sick visits, but many do, so it is worth a call to see if yours does. If you don't know where your local health department is, you can check in your phone book. The CDC also has a tool to help you locate your state's health department, and many state websites also have listings of all of their local health departments.  3) Find a Walk-in Clinic If your illness is not likely to be very severe or complicated, you may want to try a walk in clinic. These are popping up all over the country in pharmacies, drugstores, and shopping centers. They're usually staffed by nurse practitioners or physician assistants that have been trained to treat common illnesses and complaints. They're usually fairly quick and inexpensive. However, if  you have serious medical issues or chronic medical problems, these are probably not your best option.  No Primary Care Doctor: - Call Health Connect at  [phone] - they can help you locate a primary care doctor that  accepts your insurance, provides certain services, etc. - Physician Referral Service- [phone]  Chronic Pain Problems: Organization         Address  Phone   Notes  [facility]  [phone] Patients need to be referred by their primary care doctor.   Medication Assistance: Organization         Address  Phone   Notes  [facility] Medication [facility] [address] [place] [place], [place] [address] [phone] --Must be a resident of [facility] -- Must have NO insurance coverage whatsoever (no Medicaid/ Medicare, etc.) -- The pt. MUST have a primary care doctor that directs their care regularly and follows them in the community   [facility]  [phone]   [employer]  [phone]    Agencies that provide inexpensive medical care: Organization         Address  Phone   Notes  [facility]  [phone]   [facility]    [phone]   [facility] [address] [place], [place] [address] [phone]   [facility] [address] [place]  [address] [place] [phone]   Planned Parenthood    [phone]   [facility]    [phone]   Community Health and [facility]  [address], [place] Phone:  [phone], Fax:  [phone] Hours of Operation:  9 am - 6 pm, M-F.  Also accepts Medicaid/Medicare and self-pay.  [facility]  [address], [place] Phone: [phone], Fax: [phone]. Hours of Operation:  8:30 am - 5:30 pm, M-F.  Also accepts Medicaid and self-pay.  [facility] [place] [address], [place] Phone: [phone]   [facility] [address] Natasha Bence [place], [place] [phone], Ext. [phone] Mondays & Thursdays: 7-9 AM.  First 15 patients are seen on a first come, first serve basis.    Medicaid-accepting [facility] Providers:  Organization         Address  Phone   Notes  [facility] [address], [place] [phone] Also accepts self-pay patients.  [facility] [address] Laurell Josephs [place], [place]  [phone]   [facility] [address], [place] [phone]   [facility] [address] [place] [phone]   Renaye Rakers [address], [place]   [phone] Only accepts [place] Access [place] patients after they have their name applied to their card.   Self-Pay (no insurance) in [facility]:  Organization         Address  Phone   Notes  Sickle Cell Patients, [facility] [address] [place], [place] [phone]   [facility] [address] [place], [place] [phone]   [facility]  [address], [place] [phone]   [facility]/Dr. Osei-Bonsu  [address], [place] or [address], [place] [phone] Phone number for both [place] and [place] locations is the same.  Urgent Medical and [facility] [address] [place] [phone]   [facility] [address], [place] or [address] [phone]   [facility] [address] [place] [phone], phone; [phone], fax Sees patients 1st and 3rd Saturday of every month.  Must not qualify for public or private insurance (i.e. Medicaid, Medicare, [place] Health Choice, Veterans' Benefits)  Household income should be no more than 200% of the poverty level The clinic cannot treat you if you are pregnant or think you are  pregnant  Sexually transmitted diseases are not treated at the clinic.    Dental Care: Organization         Address  Phone  Notes  [facility] Department of [facility] [address] [place], [place] [phone] Accepts children up to age 33 who are enrolled in [place] or [place] Health Choice; pregnant women with a Medicaid card; and children who have applied for Medicaid or [place] Health Choice, but were declined, whose parents can pay a reduced fee at time of service.  [facility] Department of [facility]  [address], [place] [phone] Accepts children up to age 23 who are enrolled in [place] or [place] Health Choice; pregnant women with a Medicaid card; and children who have applied for Medicaid  or [place] Health Choice, but were declined, whose parents can pay a reduced fee at time of service.  [facility]  [address], [place] [phone] Patients are seen by appointment only. Walk-ins are not accepted. [facility] will see patients 53 years of age and older. Monday - Tuesday (8am-5pm) Most Wednesdays (8:30-5pm) $30 per visit, cash only  [facility]  [address], [facility] [place] [phone] Patients are seen by appointment only. Walk-ins are not accepted. [facility] will see patients 53 years of age and older. One Wednesday Evening (Monthly: Volunteer Based).  $30 per visit, cash only  [facility]  [phone] for adults; Children under age 644, call Graduate Pediatric Dentistry at [phone]. Children aged 704-14, please call [phone] to request a pediatric application.  Dental services are provided in all areas of dental care including fillings, crowns and bridges, complete and partial dentures, implants, gum treatment, root canals, and extractions. Preventive care is also provided. Treatment is provided to both adults and  children. Patients are selected via a lottery and there is often a waiting list.   [facility] [address], [place]  [phone] [email or web address]   [facility] [address], [place], [place] [phone], Ext. [phone] Second and Fourth Thursday of each month, opens at 6:30 AM; Clinic ends at 9 AM.  Patients are seen on a first-come first-served basis, and a limited number are seen during each clinic.   [facility]  [address], [place], [place] [phone]   Eligibility Requirements You must have lived in [place], [place], or [place] counties for at least the last three months.   You cannot be eligible for state or federal sponsored [place]healthcare insurance, including CIG[employer]Veterans Administration, [place]icaid, or [employer]icare.   You generally cannot be eligible for healthcare insurance through your employer.    How to apply: Eligibility screenings are held every Tuesday and Wednesday afternoon from 1:00 pm until 4:00 pm. You do not need an appointment for the interview!  [facility] [address], [place], [place] [phone]   [facility]  [phone]   [facility]  [phone]   [facility]  [phone]    Behavioral Health Resources in the Community: Intensive Outpatient Programs Organization         Address  Phone  Notes  [facility] [address], [place], [place] [phone]   [facility] Out[facility] [address], [place], [place] [phone]   [facility] [address], [place], [place]  [phone]   [facility] [address],  [place], [place] [phone] or [phone]   Substance Abuse Resources Organization         Address  Phone  Notes  [facility]  [phone]   [facility]  [phone]   [facility]  [phone]    [facility]  [phone]   Residential & Outpatient Substance Abuse Program  [phone]   Psychological Services Organization         Address  Phone  Notes  [facility]  [phone]- [phone]   [facility]  [phone]- [phone]   [facility] [address], [place] [phone] or [phone]    Mobile Crisis Teams Organization         Address  Phone  Notes  [facility], Mobile Crisis Care Unit  [phone]   [facility]  [address] [place], [place] [phone]   [facility] [address] [place] [phone]    Self-Help/Support Groups Organization         Address  Phone             Notes  [facility] [place] - variety of support groups  336- I7437963 Call for more information  Narcotics Anonymous (NA), [facility] [address], [employer] [place]  2 meetings at this location   Statistician         Address  Phone  Notes  [facility] [address],    [place]  [phone]   [facility]  [address] [place] [address], [place], [place] [phone]   [facility] Treatment Facility [address] [place], [place] [phone] Admissions: 8am-3pm M-F  [facility] [address],    [place], [place] [phone]   [facility] [address] [place], [place], [place] [phone]   [facility] [address]  [place], [place] [phone]   [facility] - Intensive Outpatient [address] Laurell Joseph[address], [place], [place] [phone]   [facility] ([facility] [address] [place]  [place], [place] [phone] or [phone]   [facility] ([facility]) [address] [place], [place] [phone] Accepts Medicaid  [facility] [address]  [place] [phone] Substance Abuse/Addiction Treatment   [facility] Organization         Address  Phone  Notes  [facility]  [phone]   Angie Fava, PhD [address] Ervin Knack [place], [place]   [phone] or [phone]   [facility] Behavioral   [address] [place], [place] [phone]   [facility] [address] [place], [place] [phone] Insurance/Medicaid/sponsorship through [facility] [address]                                    [place], [place] [phone] Therapy/tele-psych/case  [facility] [address], [place] [phone]    Dr. Lolly Mustache  [phone]   [facility]  [employer] [facility] Dept. 1) [address] [place] 2) [address] [place] 3)  [address], [place] [phone]  [phone]   [facility] Child Abuse Hotline 5083069826 or [phone] (After Hours)        Occipital Neuralgia Occipital neuralgia is a type of headache that causes episodes of very bad pain in the back of your head. Pain from occipital neuralgia may spread (radiate) to other parts of your head. The pain is usually brief and often goes away after you rest and relax. These headaches may be caused by irritation of the nerves that leave your spinal cord high up in your neck, just below the base of your skull (occipital nerves). Your occipital nerves transmit sensations from the back of your head, the top of your head, and the areas behind your ears. CAUSES Occipital neuralgia can occur without any known cause (primary headache syndrome). In other cases, occipital neuralgia is caused by pressure on or irritation of one of  the two occipital nerves. Causes of occipital nerve compression or irritation include:  Wear and tear of the vertebrae in the neck (osteoarthritis).  Neck injury.  Disease of the disks that separate the  vertebrae.  Tumors.  Gout.  Infections.  Diabetes.  Swollen blood vessels that put pressure on the occipital nerves.  Muscle spasm in the neck. SIGNS AND SYMPTOMS Pain is the main symptom of occipital neuralgia. It usually starts in the back of the head but may also be felt in other areas supplied by the occipital nerves. Pain is usually on one side but may be on both sides. You may have:   Brief episodes of very bad pain that is burning, stabbing, shocking, or shooting.  Pain behind the eye.  Pain triggered by neck movement or hair brushing.  Scalp tenderness.  Aching in the back of the head between episodes of very bad pain. DIAGNOSIS  Your health care provider may diagnose occipital neuralgia based on your symptoms and a physical exam. During the exam, the health care provider may push on areas supplied by the occipital nerves to see if they are painful. Some tests may also be done to help in making the diagnosis. These may include:  Imaging studies of the upper spinal cord, such as an MRI or CT scan. These may show compression or spinal cord abnormalities.  Nerve block. You will get an injection of numbing medicine (local anesthetic) near the occipital nerve to see if this relieves pain. TREATMENT  Treatment may begin with simple measures, such as:   Rest.  Massage.  Heat.  Over-the-counter pain relievers. If these measures do not work, you may need other treatments, including:  Medicines such as:  Prescription-strength anti-inflammatory medicines.  Muscle relaxants.  Antiseizure medicines.  Antidepressants.  Steroid injection. This involves injections of local anesthetic and strong anti-inflammatory drugs (steroids).  Pulsed radiofrequency. Wires are implanted to deliver electrical impulses that block pain signals from the occipital nerve.  Physical therapy.  Surgery to relieve nerve pressure. HOME CARE INSTRUCTIONS  Take all medicines as directed by  your health care provider.  Avoid activities that cause pain.  Rest when you have an attack of pain.  Try gentle massage or a heating pad to relieve pain.  Work with a physical therapist to learn stretching exercises you can do at home.  Try a different pillow or sleeping position.  Practice good posture.  Try to stay active. Get regular exercise that does not cause pain. Ask your health care provider to suggest safe exercises for you.  Keep all follow-up visits as directed by your health care provider. This is important. SEEK MEDICAL CARE IF:  Your medicine is not working.  You have new or worsening symptoms. SEEK IMMEDIATE MEDICAL CARE IF:  You have very bad head pain that is not going away.  You have a sudden change in vision, balance, or speech. MAKE SURE YOU:  Understand these instructions.  Will watch your condition.  Will get help right away if you are not doing well or get worse. Document Released: 01/19/2001 Document Revised: 06/11/2013 Document Reviewed: 01/17/2013 [facility] Patient Information 2015 [place], [place]. This information is not intended to replace advice given to you by your health care provider. Make sure you discuss any questions you have with your health care provider. Occipital Neuralgia Occipital neuralgia is a type of headache that causes episodes of very bad pain in the back of your head. Pain from occipital neuralgia may spread (radiate) to other  parts of your head. The pain is usually brief and often goes away after you rest and relax. These headaches may be caused by irritation of the nerves that leave your spinal cord high up in your neck, just below the base of your skull (occipital nerves). Your occipital nerves transmit sensations from the back of your head, the top of your head, and the areas behind your ears. CAUSES Occipital neuralgia can occur without any known cause (primary headache syndrome). In other cases, occipital neuralgia is  caused by pressure on or irritation of one of the two occipital nerves. Causes of occipital nerve compression or irritation include:  Wear and tear of the vertebrae in the neck (osteoarthritis).  Neck injury.  Disease of the disks that separate the vertebrae.  Tumors.  Gout.  Infections.  Diabetes.  Swollen blood vessels that put pressure on the occipital nerves.  Muscle spasm in the neck. SIGNS AND SYMPTOMS Pain is the main symptom of occipital neuralgia. It usually starts in the back of the head but may also be felt in other areas supplied by the occipital nerves. Pain is usually on one side but may be on both sides. You may have:   Brief episodes of very bad pain that is burning, stabbing, shocking, or shooting.  Pain behind the eye.  Pain triggered by neck movement or hair brushing.  Scalp tenderness.  Aching in the back of the head between episodes of very bad pain. DIAGNOSIS  Your health care provider may diagnose occipital neuralgia based on your symptoms and a physical exam. During the exam, the health care provider may push on areas supplied by the occipital nerves to see if they are painful. Some tests may also be done to help in making the diagnosis. These may include:  Imaging studies of the upper spinal cord, such as an MRI or CT scan. These may show compression or spinal cord abnormalities.  Nerve block. You will get an injection of numbing medicine (local anesthetic) near the occipital nerve to see if this relieves pain. TREATMENT  Treatment may begin with simple measures, such as:   Rest.  Massage.  Heat.  Over-the-counter pain relievers. If these measures do not work, you may need other treatments, including:  Medicines such as:  Prescription-strength anti-inflammatory medicines.  Muscle relaxants.  Antiseizure medicines.  Antidepressants.  Steroid injection. This involves injections of local anesthetic and strong anti-inflammatory drugs  (steroids).  Pulsed radiofrequency. Wires are implanted to deliver electrical impulses that block pain signals from the occipital nerve.  Physical therapy.  Surgery to relieve nerve pressure. HOME CARE INSTRUCTIONS  Take all medicines as directed by your health care provider.  Avoid activities that cause pain.  Rest when you have an attack of pain.  Try gentle massage or a heating pad to relieve pain.  Work with a physical therapist to learn stretching exercises you can do at home.  Try a different pillow or sleeping position.  Practice good posture.  Try to stay active. Get regular exercise that does not cause pain. Ask your health care provider to suggest safe exercises for you.  Keep all follow-up visits as directed by your health care provider. This is important. SEEK MEDICAL CARE IF:  Your medicine is not working.  You have new or worsening symptoms. SEEK IMMEDIATE MEDICAL CARE IF:  You have very bad head pain that is not going away.  You have a sudden change in vision, balance, or speech. MAKE SURE YOU:  Understand these  instructions.  Will watch your condition.  Will get help right away if you are not doing well or get worse. Document Released: 01/19/2001 Document Revised: 06/11/2013 Document Reviewed: 01/17/2013 [facility] Patient Information 2015 [place], [place]. This information is not intended to replace advice given to you by your health care provider. Make sure you discuss any questions you have with your health care provider. Transient Ischemic Attack A transient ischemic attack (TIA) is a "warning stroke" that causes stroke-like symptoms. Unlike a stroke, a TIA does not cause permanent damage to the brain. The symptoms of a TIA can happen very fast and do not last long. It is important to know the symptoms of a TIA and what to do. This can help prevent a major stroke or death. CAUSES   A TIA is caused by a temporary blockage in an artery in the brain or  neck (carotid artery). The blockage does not allow the brain to get the blood supply it needs and can cause different symptoms. The blockage can be caused by either:  A blood clot.  Fatty buildup (plaque) in a neck or brain artery. RISK FACTORS  High blood pressure (hypertension).  High cholesterol.  Diabetes mellitus.  Heart disease.  The build up of plaque in the blood vessels (peripheral artery disease or atherosclerosis).  The build up of plaque in the blood vessels providing blood and oxygen to the brain (carotid artery stenosis).  An abnormal heart rhythm (atrial fibrillation).  Obesity.  Smoking.  Taking oral contraceptives (especially in combination with smoking).  Physical inactivity.  A diet high in fats, salt (sodium), and calories.  Alcohol use.  Use of illegal drugs (especially cocaine and methamphetamine).  Being female.  Being African American.  Being over the age of 72.  Family history of stroke.  Previous history of blood clots, stroke, TIA, or heart attack.  Sickle cell disease. SYMPTOMS  TIA symptoms are the same as a stroke but are temporary. These symptoms usually develop suddenly, or may be newly present upon awakening from sleep:  Sudden weakness or numbness of the face, arm, or leg, especially on one side of the body.  Sudden trouble walking or difficulty moving arms or legs.  Sudden confusion.  Sudden personality changes.  Trouble speaking (aphasia) or understanding.  Difficulty swallowing.  Sudden trouble seeing in one or both eyes.  Double vision.  Dizziness.  Loss of balance or coordination.  Sudden severe headache with no known cause.  Trouble reading or writing.  Loss of bowel or bladder control.  Loss of consciousness. DIAGNOSIS  Your caregiver may be able to determine the presence or absence of a TIA based on your symptoms, history, and physical exam. Computed tomography (CT scan) of the brain is usually  performed to help identify a TIA. Other tests may be done to diagnose a TIA. These tests may include:  Electrocardiography.  Continuous heart monitoring.  Echocardiography.  Carotid ultrasonography.  Magnetic resonance imaging (MRI).  A scan of the brain circulation.  Blood tests. PREVENTION  The risk of a TIA can be decreased by appropriately treating high blood pressure, high cholesterol, diabetes, heart disease, and obesity and by quitting smoking, limiting alcohol, and staying physically active. TREATMENT  Time is of the essence. Since the symptoms of TIA are the same as a stroke, it is important to seek treatment as soon as possible because you may need a medicine to dissolve the clot (thrombolytic) that cannot be given if too much time has passed. Treatment  options vary. Treatment options may include rest, oxygen, intravenous (IV) fluids, and medicines to thin the blood (anticoagulants). Medicines and diet may be used to address diabetes, high blood pressure, and other risk factors. Measures will be taken to prevent short-term and long-term complications, including infection from breathing foreign material into the lungs (aspiration pneumonia), blood clots in the legs, and falls. Treatment options include procedures to either remove plaque in the carotid arteries or dilate carotid arteries that have narrowed due to plaque. Those procedures are:  Carotid endarterectomy.  Carotid angioplasty and stenting. HOME CARE INSTRUCTIONS   Take all medicines prescribed by your caregiver. Follow the directions carefully. Medicines may be used to control risk factors for a stroke. Be sure you understand all your medicine instructions.  You may be told to take aspirin or the anticoagulant warfarin. Warfarin needs to be taken exactly as instructed.  Taking too much or too little warfarin is dangerous. Too much warfarin increases the risk of bleeding. Too little warfarin continues to allow the risk  for blood clots. While taking warfarin, you will need to have regular blood tests to measure your blood clotting time. A PT blood test measures how long it takes for blood to clot. Your PT is used to calculate another value called an INR. Your PT and INR help your caregiver to adjust your dose of warfarin. The dose can change for many reasons. It is critically important that you take warfarin exactly as prescribed.  Many foods, especially foods high in vitamin K can interfere with warfarin and affect the PT and INR. Foods high in vitamin K include spinach, kale, broccoli, cabbage, collard and turnip greens, brussels sprouts, peas, cauliflower, seaweed, and parsley as well as beef and pork liver, green tea, and soybean oil. You should eat a consistent amount of foods high in vitamin K. Avoid major changes in your diet, or notify your caregiver before changing your diet. Arrange a visit with a dietitian to answer your questions.  Many medicines can interfere with warfarin and affect the PT and INR. You must tell your caregiver about any and all medicines you take, this includes all vitamins and supplements. Be especially cautious with aspirin and anti-inflammatory medicines. Do not take or discontinue any prescribed or over-the-counter medicine except on the advice of your caregiver or pharmacist.  Warfarin can have side effects, such as excessive bruising or bleeding. You will need to hold pressure over cuts for longer than usual. Your caregiver or pharmacist will discuss other potential side effects.  Avoid sports or activities that may cause injury or bleeding.  Be mindful when shaving, flossing your teeth, or handling sharp objects.  Alcohol can change the body's ability to handle warfarin. It is best to avoid alcoholic drinks or consume only very small amounts while taking warfarin. Notify your caregiver if you change your alcohol intake.  Notify your dentist or other caregivers before  procedures.  Eat a diet that includes 5 or more servings of fruits and vegetables each day. This may reduce the risk of stroke. Certain diets may be prescribed to address high blood pressure, high cholesterol, diabetes, or obesity.  A low-sodium, low-saturated fat, low-trans fat, low-cholesterol diet is recommended to manage high blood pressure.  A low-saturated fat, low-trans fat, low-cholesterol, and high-fiber diet may control cholesterol levels.  A controlled-carbohydrate, controlled-sugar diet is recommended to manage diabetes.  A reduced-calorie, low-sodium, low-saturated fat, low-trans fat, low-cholesterol diet is recommended to manage obesity.  Maintain a healthy weight.  Stay  physically active. It is recommended that you get at least 30 minutes of activity on most or all days.  Do not smoke.  Limit alcohol use even if you are not taking warfarin. Moderate alcohol use is considered to be:  No more than 2 drinks each day for men.  No more than 1 drink each day for nonpregnant women.  Stop drug abuse.  Home safety. A safe home environment is important to reduce the risk of falls. Your caregiver may arrange for specialists to evaluate your home. Having grab bars in the bedroom and bathroom is often important. Your caregiver may arrange for equipment to be used at home, such as raised toilets and a seat for the shower.  Follow all instructions for follow-up with your caregiver. This is very important. This includes any referrals and lab tests. Proper follow up can prevent a stroke or another TIA from occurring. SEEK MEDICAL CARE IF:  You have personality changes.  You have difficulty swallowing.  You are seeing double.  You have dizziness.  You have a fever.  You have skin breakdown. SEEK IMMEDIATE MEDICAL CARE IF:  Any of these symptoms may represent a serious problem that is an emergency. Do not wait to see if the symptoms will go away. Get medical help right away.  Call your local emergency services (911 in [place] Do not drive yourself to the hospital.  You have sudden weakness or numbness of the face, arm, or leg, especially on one side of the body.  You have sudden trouble walking or difficulty moving arms or legs.  You have sudden confusion.  You have trouble speaking (aphasia) or understanding.  You have sudden trouble seeing in one or both eyes.  You have a loss of balance or coordination.  You have a sudden, severe headache with no known cause.  You have new chest pain or an irregular heartbeat.  You have a partial or total loss of consciousness. MAKE SURE YOU:   Understand these instructions.  Will watch your condition.  Will get help right away if you are not doing well or get worse. Document Released: 11/04/2004 Document Revised: 01/30/2013 Document Reviewed: 05/02/2013 [facility] Patient Information 2015 [employer] [place]. This information is not intended to replace advice given to you by your health care provider. Make sure you discuss any questions you have with your health care provider.

## 2013-12-04 NOTE — Progress Notes (Signed)
Subjective: Pt received b/l occipital nerve blocks yesterday. Pt still complaining of headache and left sided face numbness. She still has intact sensation but states it feels different from her left side.   Overnight tele negative  Objective: Vital signs in last 24 hours: Filed Vitals:   12/03/13 2225 12/04/13 0132 12/04/13 0523 12/04/13 1033  BP: 111/61 125/78 113/70   Pulse: 92 92 85 93  Temp: 97.9 F (36.6 C) 98 F (36.7 C) 97.9 F (36.6 C) 97.8 F (36.6 C)  TempSrc: Oral Oral Oral Oral  Resp: 18 20 18 18   Height:      Weight:      SpO2: 98% 94% 95% 97%   Physical Exam General: NAD HEENT: EOMI, photophobia Pulm: CTAB  Cardiac: RRR GI: active bowel sounds, tender to palpation of RUQ, no masses felt Neuro: CN 2-12 grossly intact, moving all extremities, symmetric smile, equal strength in UE and LE b/l  Lab Results: Basic Metabolic Panel:  Recent Labs Lab 12/01/13 0655 12/02/13 0520  NA 138 138  K 3.8 3.9  CL 99 101  CO2 26 26  GLUCOSE 109* 105*  BUN 12 17  CREATININE 0.99 1.01  CALCIUM 9.2 9.4   Liver Function Tests:  Recent Labs Lab 12/01/13 0655 12/02/13 1100  AST 55* 43*  ALT 85* 70*  ALKPHOS 105 100  BILITOT 0.4 0.5  PROT 7.3 7.3  ALBUMIN 4.0 4.0   CBC:  Recent Labs Lab 12/01/13 0655 12/02/13 0520  WBC 4.6 4.5  NEUTROABS 1.9  --   HGB 14.0 13.1  HCT 40.4 38.0  MCV 94.6 94.8  PLT 147* 161   CBG:  Recent Labs Lab 12/01/13 0654  GLUCAP 101*   Hemoglobin A1C:  Recent Labs Lab 12/01/13 1615  HGBA1C 5.1   Fasting Lipid Panel:  Recent Labs Lab 12/01/13 1615  CHOL 225*  HDL 67  LDLCALC 135*  TRIG 116  CHOLHDL 3.4   Thyroid Function Tests:  Recent Labs Lab 12/01/13 1615  TSH 1.610    Studies/Results: Ct Head Wo Contrast  12/03/2013   CLINICAL DATA:  Initial valuation for left-sided weakness and numbness.  EXAM: CT HEAD WITHOUT CONTRAST  TECHNIQUE: Contiguous axial images were obtained from the base of the  skull through the vertex without intravenous contrast.  COMPARISON:  Prior MRI from 12/01/2013  FINDINGS: There is no acute intracranial hemorrhage or infarct. No mass lesion or midline shift. Gray-white matter differentiation is well maintained. Ventricles are normal in size without evidence of hydrocephalus. CSF containing spaces are within normal limits. No extra-axial fluid collection.  The calvarium is intact.  Orbital soft tissues are within normal limits.  The paranasal sinuses and mastoid air cells are well pneumatized and free of fluid.  Scalp soft tissues are unremarkable.  IMPRESSION: No acute intracranial process.   Electronically Signed   By: Rise MuBenjamin  McClintock M.D.   On: 12/03/2013 05:26   Mr Maxine GlennMra Head Wo Contrast  12/03/2013   CLINICAL DATA:  53 year old female with face arm and leg numbness and tingling. Subsequent encounter.  EXAM: MRA HEAD WITHOUT CONTRAST  TECHNIQUE: Angiographic images of the Circle of Willis were obtained using MRA technique without intravenous contrast.  COMPARISON:  12/03/2013 CT and 12/01/2013 MR.  FINDINGS: Anterior circulation without medium or large size vessel significant stenosis or occlusion.  Middle cerebral artery and A2 segment anterior cerebral artery branch vessel irregularity.  Ectatic left ophthalmic artery without discrete saccular aneurysm.  No significant narrowing of the distal vertebral  arteries or basilar artery.  Nonvisualized anterior inferior cerebellar artery.  Duplicated small left superior cerebellar arteries.  IMPRESSION: Mild branch vessel irregularity as noted above.   Electronically Signed   By: Bridgett LarssonSteve  Olson M.D.   On: 12/03/2013 22:10   Medications: I have reviewed the patient's current medications. Scheduled Meds: . atorvastatin  40 mg Oral q1800  . citalopram  20 mg Oral Daily  . enoxaparin (LOVENOX) injection  40 mg Subcutaneous Q24H  . HYDROcodone-acetaminophen  1 tablet Oral Once  . metoprolol succinate  50 mg Oral Daily  .  nortriptyline  50 mg Oral QHS  . pantoprazole  40 mg Oral Daily   Continuous Infusions:  PRN Meds:.albuterol, hydrOXYzine, ibuprofen, promethazine, traMADol Assessment/Plan: Principal Problem:   Left sided numbness Active Problems:   MVP (mitral valve prolapse)   Tobacco user   Left sided numbness:  - MRI and CT of head negative, ECHO negative for source of emboli, carotid dopplers negative - MRA head positive for irregular middle cerebral artery and A2 segment anterior cerebral artery branch vessel  - neurology following, believe occipital neuralgia the cause of pt's symptoms. Nerve block can last 2 days to 2 months. Will recommend patient f/u with pain clinic if she requires future nerve blocks.  - LDL elevated, pt does not want to be started on statin, informed will give prescription for lipitor if she changes her mind, will continue statin while inpatient - neuro checks q4h  - pt w/ multiple drug allergies, tramadol and tylenol have not helped w/ HA. Will give one dose of hydrocodone now and d/c pt home w/ 14 tabs hydrocodone as needed for severe HA.   Gastroenteritis- pt last seen in the ED for this on 10/18 at which time CT abd/pelvis which was negative for any acute process, and u/s of abd noted hepatic steatosis. She was d/c'd home with a PPI and reglan.  - d/c'd reglan as not a good tx for this problem, can give phenergan for nausea.  - senokot  - protonix   Hx of tachycardia-- HR on admission 108, now 93. on home toprol 50mg  QD, BP stable  - continue home med   Insomnia-- on home nortriptyline 50mg  qhs  - continue home med  - this should also help w/ occipital neuralgia   Depression  - continue home med celexa   FEN:  - lovenox  - regular diet   Dispo: d/c home today .  The patient does not have a current PCP (No Pcp Per Patient) and [facility] after [facility].   The patient does not have transportation limitations that  hinder transportation to clinic appointments.    LOS: 3 days   Gara Kroneriana Oliver Heitzenrater, MD 12/04/2013, 11:14 AM

## 2013-12-04 NOTE — Progress Notes (Signed)
  Date: 12/04/2013  Patient name: Sylvia DesanctisBarbara Hall  Medical record number: [id number]  Date of birth: 10/14/1960   This patient has been seen and the plan of care was discussed with the house staff. Please see their note for complete details. I concur with their findings with the following additions/corrections:  Patient's headache much improved with occipital nerve block.  She is okay for discharge today with good follow up with Pain Clinic for further blocks as needed.   Inez CatalinaEmily B Blakeley Scheier, MD 12/04/2013, 3:38 PM

## 2013-12-04 NOTE — Progress Notes (Signed)
Pt d/c to home by car with family. Assessment stable. Prescriptions given. Pt verbalizes understanding of d/c instructions. 

## 2013-12-06 NOTE — Discharge Summary (Signed)
I saw [name] on day of discharge and assisted in the discharge planning.  Please note duplicate Ultram, she is only on 1 prescription for this.

## 2013-12-10 ENCOUNTER — Encounter (HOSPITAL_COMMUNITY): Payer: Self-pay | Admitting: Emergency Medicine

## 2014-02-20 ENCOUNTER — Ambulatory Visit (HOSPITAL_COMMUNITY): Payer: Self-pay | Admitting: Psychiatry

## 2014-02-21 ENCOUNTER — Other Ambulatory Visit: Payer: Self-pay | Admitting: Cardiology

## 2014-02-28 ENCOUNTER — Ambulatory Visit (HOSPITAL_COMMUNITY): Payer: Self-pay | Admitting: Psychiatry

## 2014-03-12 ENCOUNTER — Ambulatory Visit (HOSPITAL_COMMUNITY): Payer: Self-pay | Admitting: Psychiatry

## 2014-03-13 ENCOUNTER — Ambulatory Visit (INDEPENDENT_AMBULATORY_CARE_PROVIDER_SITE_OTHER): Payer: PRIVATE HEALTH INSURANCE | Admitting: Psychiatry

## 2014-03-13 VITALS — BP 128/87 | HR 107 | Ht 63.0 in | Wt 227.0 lb

## 2014-03-13 DIAGNOSIS — F331 Major depressive disorder, recurrent, moderate: Secondary | ICD-10-CM

## 2014-03-13 DIAGNOSIS — F329 Major depressive disorder, single episode, unspecified: Secondary | ICD-10-CM

## 2014-03-13 MED ORDER — NORTRIPTYLINE HCL 25 MG PO CAPS: 50.0000 mg | ORAL_CAPSULE | Freq: Every day | ORAL | Status: DC

## 2014-03-13 MED ORDER — HYDROXYZINE HCL 25 MG PO TABS: 50.0000 mg | ORAL_TABLET | ORAL | Status: DC | PRN

## 2014-03-13 MED ORDER — CITALOPRAM HYDROBROMIDE 20 MG PO TABS: 20.0000 mg | ORAL_TABLET | Freq: Every day | ORAL | Status: DC

## 2014-03-13 MED ORDER — TRAZODONE HCL 100 MG PO TABS: 100.0000 mg | ORAL_TABLET | Freq: Every day | ORAL | Status: DC

## 2014-03-13 NOTE — Progress Notes (Addendum)
[facility] MD Progress Note  03/13/2014 4:04 PM Sylvia Hall  MRN:  [id number] Subjective:  Not great Today the patient comes 15 minutes late. The patient is not doing that great because she recently had a TIA and some other neurological problems. The patient has slow down. She's not enjoying as much as she was before. She still plays with her computer playing games. She still has a dog. The patient denies persistent daily depression but she says her sleep is more disrupted. She's eating fairly well can concentrate without a problem and still feels like she has worth. She denies being suicidal. She denies the use of alcohol or drugs. The patient is having problems with her weight. The patient takes her medicines as prescribed. At this time she is off Klonopin. She doesn't seem to complain about it at all. She describes a moderate amount of anxiety which is somewhat worse. Again the patient has been unable to connect with a therapist in the setting. They're scheduling problems and problems with the patient being available. Principal Problem:Major Depression Diagnosis:   Patient Active Problem List   Diagnosis Date Noted  . Left sided numbness [R20.0] 12/01/2013  . Acute sinusitis, unspecified [J01.90] 12/08/2012  . Muscle spasm of right leg [M62.838] 12/08/2012  . Acute upper respiratory infections of unspecified site [J06.9] 12/08/2012  . Major depressive disorder, recurrent episode, severe, without mention of psychotic behavior [F33.2] 12/07/2012  . Borderline personality disorder [F60.3] 10/19/2012  . Acute posttraumatic stress disorder [F43.11] 08/24/2012  . Palpitations [R00.2] 08/16/2012  . Neuropathy [G62.9] 08/16/2012  . Restless leg syndrome [G25.81] 08/16/2012  . Routine gynecological examination [Z01.419] 07/31/2012  . Neurogenic pain [M79.2] 04/12/2012  . Anxiety [F41.9] 11/03/2011  . Vitamin D deficiency [E55.9] 08/08/2011  . Chronic back pain [M54.9, G89.29] 05/29/2011  . MVP (mitral  valve prolapse) [I34.1] 05/29/2011  . Asthma [J45.909] 05/29/2011  . Tobacco user [Z72.0] 05/29/2011  . Chronic mastitis [N60.19] 05/29/2011  . Allergic rhinitis [J30.9] 05/29/2011   Total Time spent with patient: 30 minutes   Past Medical History:  Past Medical History  Diagnosis Date  . Asthma   . Back pain, chronic   . Thrombophlebitis   . Mitral valve prolapse   . Epilepsy   . Stenosis of lumbosacral spine     numbness L hand and L leg  . Pericarditis   . Depression   . PTSD (post-traumatic stress disorder)   . Anxiety   . Fatigue     Past Surgical History  Procedure Laterality Date  . Nose surgery      s/p trauma  . Appendectomy    . Cesarean section      x 2  . Ovary surgery    . Uterine fibroid surgery    . Breast lumpectomy    . Breast lumpectomy      Right breast  . Biopsy breast      Right breast x 10  . Rhinoplasty     Family History:  Family History  Problem Relation Age of Onset  . Diabetes Mother   . Mitral valve prolapse Mother   . Asthma Mother   . CAD Mother 13  . Anxiety disorder Mother   . Mitral valve prolapse Sister   . Asthma Sister   . Mitral valve prolapse Son   . Alcohol abuse Son   . Diabetes Maternal Uncle   . Diabetes Maternal Grandmother   . Mitral valve prolapse Sister   . CAD Son 61  No details available  . Alcohol abuse Father   . Alcohol abuse Brother    Social History:  History  Alcohol Use No     History  Drug Use No    History   Social History  . Marital Status: Divorced    Spouse Name: N/A    Number of Children: 2  . Years of Education: N/A   Social History Main Topics  . Smoking status: Current Every Day Smoker -- 1.00 packs/day for 12 years    Types: Cigarettes  . Smokeless tobacco: Not on file  . Alcohol Use: No  . Drug Use: No  . Sexual Activity: Not Currently    Birth Control/ Protection: None   Other Topics Concern  . Not on file   Social History Narrative   Lives alone.      Additional History:    Sleep: Poor  Appetite:  Good   Assessment:   Musculoskeletal: Strength & Muscle Tone:  Gait & Station:  Patient leans:    Psychiatric Specialty Exam: Physical Exam  ROS  There were no vitals taken for this visit.There is no weight on file to calculate BMI.  General Appearance: Casual  Eye Contact::  Good  Speech:  Normal Rate  Volume:  Normal  Mood:  Dysphoric  Affect:  Blunt  Thought Process:  Coherent  Orientation:  Full (Time, Place, and Person)  Thought Content:  WDL  Suicidal Thoughts:  No  Homicidal Thoughts:  No  Memory:  NA  Judgement:  Fair  Insight:  Lacking  Psychomotor Activity:  Normal  Concentration:  Fair  Recall:  Good  Fund of Knowledge:Fair  Language: Good  Akathisia:  No  Handed:  Right  AIMS (if indicated):     Assets:  Communication Skills  ADL's:  Intact  Cognition: WNL  Sleep:        Current Medications: Current Outpatient Prescriptions  Medication Sig Dispense Refill  . albuterol (PROVENTIL HFA;VENTOLIN HFA) 108 (90 BASE) MCG/ACT inhaler Inhale 2 puffs into the lungs every 6 (six) hours as needed for wheezing or shortness of breath.    Marland Kitchen. aspirin EC 81 MG tablet Take 1 tablet (81 mg total) by mouth daily. 30 tablet 6  . atorvastatin (LIPITOR) 40 MG tablet Take 1 tablet (40 mg total) by mouth daily at 6 PM. 30 tablet 6  . citalopram (CELEXA) 20 MG tablet Take 1 tablet (20 mg total) by mouth daily. 30 tablet 5  . furosemide (LASIX) 20 MG tablet Take 10 mg by mouth daily as needed for edema.    Marland Kitchen. HYDROcodone-acetaminophen (NORCO/VICODIN) 5-325 MG per tablet Take 1 tablet by mouth daily as needed (for severe headache). 6 tablet 0  . hydrOXYzine (ATARAX/VISTARIL) 25 MG tablet Take 2 tablets (50 mg total) by mouth every 4 (four) hours as needed for anxiety. 30 tablet 5  . lansoprazole (PREVACID) 30 MG capsule Take 1 capsule (30 mg total) by mouth daily at 12 noon. 30 capsule 0  . metoprolol succinate (TOPROL-XL) 50  MG 24 hr tablet TAKE 1 TABLET BY MOUTH DAILY 30 tablet 6  . nortriptyline (PAMELOR) 25 MG capsule Take 2 capsules (50 mg total) by mouth at bedtime. 30 capsule 5  . promethazine (PHENERGAN) 12.5 MG tablet Take 1 tablet (12.5 mg total) by mouth every 6 (six) hours as needed for nausea. 15 tablet 0  . traMADol (ULTRAM) 50 MG tablet Take 1 tablet (50 mg total) by mouth every 6 (six) hours as needed for  moderate pain or severe pain. 15 tablet 0  . traMADol (ULTRAM) 50 MG tablet Take 1 tablet (50 mg total) by mouth every 6 (six) hours as needed for moderate pain. 10 tablet 0  . traZODone (DESYREL) 100 MG tablet Take 1 tablet (100 mg total) by mouth at bedtime. 30 tablet 5   No current facility-administered medications for this visit.    Lab Results: No results found for this or any previous visit (from the past 48 hour(s)).  Physical Findings: AIMS:  , ,  ,  ,    CIWA:    COWS:     Treatment Plan Summary: At this time we shall add trazodone 100 mg at night for sleep. She'll continue taking Vistaril 50 mg 4 times a day and nortriptyline 50 mg at night. She'll continue taking Celexa 20 mg. She is off all benzodiazepines. At this time we'll making arrangements to see a therapist in the setting in the next week or 2. This patient to return to see me in 2 months. This patient is not suicidal and generally is compliant.   Medical Decision Making:  Review of Medication Regimen & Side Effects (2)     Arvetta Araque IRVING 03/13/2014, 4:04 PM

## 2014-04-17 ENCOUNTER — Ambulatory Visit (HOSPITAL_COMMUNITY): Payer: Self-pay | Admitting: Clinical

## 2014-05-15 ENCOUNTER — Ambulatory Visit (HOSPITAL_COMMUNITY): Payer: Self-pay | Admitting: Psychiatry

## 2014-05-29 ENCOUNTER — Telehealth (HOSPITAL_COMMUNITY): Payer: Self-pay | Admitting: *Deleted

## 2014-05-29 ENCOUNTER — Ambulatory Visit (HOSPITAL_COMMUNITY): Payer: Self-pay | Admitting: Clinical

## 2014-05-29 NOTE — Telephone Encounter (Signed)
Patient no showed appointment today with Frankie.  Letter sent.  

## 2014-07-04 ENCOUNTER — Ambulatory Visit (HOSPITAL_COMMUNITY): Payer: Self-pay | Admitting: Psychiatry

## 2014-07-19 ENCOUNTER — Encounter (HOSPITAL_COMMUNITY): Payer: Self-pay | Admitting: Psychiatry

## 2014-07-19 ENCOUNTER — Ambulatory Visit (INDEPENDENT_AMBULATORY_CARE_PROVIDER_SITE_OTHER): Payer: PRIVATE HEALTH INSURANCE | Admitting: Psychiatry

## 2014-07-19 VITALS — BP 122/86 | HR 105 | Ht 63.0 in | Wt 230.0 lb

## 2014-07-19 DIAGNOSIS — F339 Major depressive disorder, recurrent, unspecified: Secondary | ICD-10-CM

## 2014-07-19 DIAGNOSIS — F331 Major depressive disorder, recurrent, moderate: Secondary | ICD-10-CM

## 2014-07-19 MED ORDER — CITALOPRAM HYDROBROMIDE 40 MG PO TABS: 40.0000 mg | ORAL_TABLET | Freq: Every day | ORAL | Status: DC

## 2014-07-19 NOTE — Progress Notes (Signed)
[facility] MD Progress Note  07/19/2014 9:16 AM Sylvia Hall  MRN:  [id number] Subjective:  Sucks Today the patient is seen with [name] her boyfriend. The patient describes persistent daily depression which is new in the last 2 months. Her sleep is disturbed but does not produce any daytime dysfunction. Her appetite is reduced yet appear toxically she's gaining weight. It is noted this patient is not in any medical care yet it is noted that she has asthma and a cardiac problem. The patient denies shortness of breath or chest pain. The patient describes a decrease in energy but can concentrate fairly well. She spends a great deal of time on the computer building imaginary city. She does not read or watch TV she does enjoy 2 dogs. Her psychomotor functioning is normal. She denies worthlessness but feels a reduction in her self-esteem. The patient denies suicidal ideation. There is no evidence of psychosis. The patient denies use of alcohol or drugs. The patient is hesitant to go out into the social world because she feels uncomfortable but cannot put it into words why. This patient was seen many months ago that his missed appointments because she's been physically ill with the flu one time in one time didn't have transportation. The same medical explanations are given of why she has not started in therapy which was set up for her. In essence the patient is had a hard time being compliant and consistent with care. The patient went to the pharmacy to get the trazodone I ordered but find out that she's been on it before. She however didn't know the dose she was on before and likely was a lower dose. I do not think sleep is a big issue at this time I think the biggest issue is her depression. The possibility that nortriptyline is patent or gain weight IV asked her to discontinue it and to increase her Celexa from 20-40 mg. Principal Problem: Major depression recurrent Diagnosis:   Patient Active Problem List   Diagnosis  Date Noted  . Left sided numbness [R20.0] 12/01/2013  . Acute sinusitis, unspecified [J01.90] 12/08/2012  . Muscle spasm of right leg [M62.838] 12/08/2012  . Acute upper respiratory infections of unspecified site [J06.9] 12/08/2012  . Major depressive disorder, recurrent episode, severe, without mention of psychotic behavior [F33.2] 12/07/2012  . Borderline personality disorder [F60.3] 10/19/2012  . Acute posttraumatic stress disorder [F43.11] 08/24/2012  . Palpitations [R00.2] 08/16/2012  . Neuropathy [G62.9] 08/16/2012  . Restless leg syndrome [G25.81] 08/16/2012  . Routine gynecological examination [Z01.419] 07/31/2012  . Neurogenic pain [M79.2] 04/12/2012  . Anxiety [F41.9] 11/03/2011  . Vitamin D deficiency [E55.9] 08/08/2011  . Chronic back pain [M54.9, G89.29] 05/29/2011  . MVP (mitral valve prolapse) [I34.1] 05/29/2011  . Asthma [J45.909] 05/29/2011  . Tobacco user [Z72.0] 05/29/2011  . Chronic mastitis [N60.19] 05/29/2011  . Allergic rhinitis [J30.9] 05/29/2011   Total Time spent with patient: 30 minutes   Past Medical History:  Past Medical History  Diagnosis Date  . Asthma   . Back pain, chronic   . Thrombophlebitis   . Mitral valve prolapse   . Epilepsy   . Stenosis of lumbosacral spine     numbness L hand and L leg  . Pericarditis   . Depression   . PTSD (post-traumatic stress disorder)   . Anxiety   . Fatigue     Past Surgical History  Procedure Laterality Date  . Nose surgery      s/p trauma  . Appendectomy    .  Cesarean section      x 2  . Ovary surgery    . Uterine fibroid surgery    . Breast lumpectomy    . Breast lumpectomy      Right breast  . Biopsy breast      Right breast x 10  . Rhinoplasty     Family History:  Family History  Problem Relation Age of Onset  . Diabetes Mother   . Mitral valve prolapse Mother   . Asthma Mother   . CAD Mother 46  . Anxiety disorder Mother   . Mitral valve prolapse Sister   . Asthma Sister   .  Mitral valve prolapse Son   . Alcohol abuse Son   . Diabetes Maternal Uncle   . Diabetes Maternal Grandmother   . Mitral valve prolapse Sister   . CAD Son 42    No details available  . Alcohol abuse Father   . Alcohol abuse Brother    Social History:  History  Alcohol Use No     History  Drug Use No    History   Social History  . Marital Status: Divorced    Spouse Name: N/A  . Number of Children: 2  . Years of Education: N/A   Social History Main Topics  . Smoking status: Current Every Day Smoker -- 0.50 packs/day for 12 years    Types: Cigarettes  . Smokeless tobacco: Not on file  . Alcohol Use: No  . Drug Use: No  . Sexual Activity: Not Currently    Birth Control/ Protection: None   Other Topics Concern  . None   Social History Narrative   Lives alone.     Additional History:    Sleep: Fair  Appetite:  Fair   Assessment:   Musculoskeletal: Strength & Muscle Tone:  Gait & Station:  Patient leans:    Psychiatric Specialty Exam: Physical Exam  ROS  Blood pressure 122/86, pulse 105, height  (1.6 m), weight 230 lb (104.327 kg).Body mass index is 40.75 kg/(m^2).  General Appearance: Casual  Eye Contact::  Good  Speech:  Clear and Coherent  Volume:  Normal  Mood:  Depressed  Affect:  Blunt  Thought Process:  Coherent  Orientation:  Full (Time, Place, and Person)  Thought Content:  WDL  Suicidal Thoughts:  No  Homicidal Thoughts:  No  Memory:  NA  Judgement:  Good  Insight:  Fair  Psychomotor Activity:  Normal  Concentration:  Good  Recall:  Good  Fund of Knowledge:Fair  Language: Fair  Akathisia:  No  Handed:  Right  AIMS (if indicated):     Assets:  Communication Skills  ADL's:  Intact  Cognition: WNL  Sleep:        Current Medications: Current Outpatient Prescriptions  Medication Sig Dispense Refill  . aspirin EC 81 MG tablet Take 1 tablet (81 mg total) by mouth daily. 30 tablet 6  . citalopram (CELEXA) 40 MG tablet Take 1  tablet (40 mg total) by mouth daily. 30 tablet 5  . hydrOXYzine (ATARAX/VISTARIL) 25 MG tablet Take 2 tablets (50 mg total) by mouth every 4 (four) hours as needed for anxiety. 30 tablet 5  . metoprolol succinate (TOPROL-XL) 50 MG 24 hr tablet TAKE 1 TABLET BY MOUTH DAILY 30 tablet 6  . nortriptyline (PAMELOR) 25 MG capsule Take 2 capsules (50 mg total) by mouth at bedtime. 30 capsule 5  . albuterol (PROVENTIL HFA;VENTOLIN HFA) 108 (90 BASE) MCG/ACT inhaler Inhale  2 puffs into the lungs every 6 (six) hours as needed for wheezing or shortness of breath.    Marland Kitchen atorvastatin (LIPITOR) 40 MG tablet Take 1 tablet (40 mg total) by mouth daily at 6 PM. (Patient not taking: Reported on 07/19/2014) 30 tablet 6  . furosemide (LASIX) 20 MG tablet Take 10 mg by mouth daily as needed for edema.    Marland Kitchen HYDROcodone-acetaminophen (NORCO/VICODIN) 5-325 MG per tablet Take 1 tablet by mouth daily as needed (for severe headache). (Patient not taking: Reported on 07/19/2014) 6 tablet 0  . lansoprazole (PREVACID) 30 MG capsule Take 1 capsule (30 mg total) by mouth daily at 12 noon. (Patient not taking: Reported on 07/19/2014) 30 capsule 0  . promethazine (PHENERGAN) 12.5 MG tablet Take 1 tablet (12.5 mg total) by mouth every 6 (six) hours as needed for nausea. (Patient not taking: Reported on 07/19/2014) 15 tablet 0  . traMADol (ULTRAM) 50 MG tablet Take 1 tablet (50 mg total) by mouth every 6 (six) hours as needed for moderate pain or severe pain. (Patient not taking: Reported on 07/19/2014) 15 tablet 0  . traMADol (ULTRAM) 50 MG tablet Take 1 tablet (50 mg total) by mouth every 6 (six) hours as needed for moderate pain. (Patient not taking: Reported on 07/19/2014) 10 tablet 0  . traZODone (DESYREL) 100 MG tablet Take 1 tablet (100 mg total) by mouth at bedtime. (Patient not taking: Reported on 07/19/2014) 30 tablet 5   No current facility-administered medications for this visit.    Lab Results: No results found for this or any  previous visit (from the past 48 hour(s)).  Physical Findings: AIMS:  , ,  ,  ,    CIWA:    COWS:     Treatment Plan Summary: At this time we will discontinue her nortriptyline. She will increase her Celexa from 20-40 mg. At this time we'll asked the patient to get some blood work including a conference a metabolic panel and a TSH. The patient will make another appointment to start in therapy in the setting. I will not go ahead and give anything for sleep. The patient will continue taking Vistaril as ordered. This patient to return in 7 weeks   Medical Decision Making:  New problem, with additional work up planned     Lucas Mallow 07/19/2014, 9:16 AM

## 2014-08-20 ENCOUNTER — Ambulatory Visit (HOSPITAL_COMMUNITY): Payer: Self-pay | Admitting: Clinical

## 2014-09-06 ENCOUNTER — Ambulatory Visit (HOSPITAL_COMMUNITY): Payer: Self-pay | Admitting: Psychiatry

## 2014-09-24 ENCOUNTER — Other Ambulatory Visit: Payer: Self-pay | Admitting: Cardiology

## 2014-09-24 NOTE — Telephone Encounter (Signed)
This pt hasn't been seen since 11-2012, where he last saw Dr. Antoine Poche, and was advised to f/u as needed.  Pt requesting a refill for Metoprolol, which she was given 6 refills in January 2016, but still doesn't have a f/u appt.  I was not sure if it was ok to refill or not.  Please advise!  Thanks!

## 2014-09-26 ENCOUNTER — Other Ambulatory Visit: Payer: Self-pay | Admitting: Cardiology

## 2014-09-26 ENCOUNTER — Other Ambulatory Visit: Payer: Self-pay

## 2014-09-26 MED ORDER — METOPROLOL SUCCINATE ER 50 MG PO TB24: 50.0000 mg | ORAL_TABLET | Freq: Every day | ORAL | Status: DC

## 2014-11-14 ENCOUNTER — Encounter: Payer: Self-pay | Admitting: Cardiology

## 2014-11-14 ENCOUNTER — Ambulatory Visit (INDEPENDENT_AMBULATORY_CARE_PROVIDER_SITE_OTHER): Payer: PRIVATE HEALTH INSURANCE | Admitting: Cardiology

## 2014-11-14 VITALS — BP 138/72 | HR 72 | Ht 63.0 in | Wt 229.0 lb

## 2014-11-14 DIAGNOSIS — R002 Palpitations: Secondary | ICD-10-CM

## 2014-11-14 MED ORDER — METOPROLOL SUCCINATE ER 50 MG PO TB24: 50.0000 mg | ORAL_TABLET | Freq: Every day | ORAL | Status: DC

## 2014-11-14 NOTE — Progress Notes (Signed)
HPI The patient presents for evaluation of palpitations and mitral valve prolapse.  I saw her in the past about this. However, in 2014 when I did an echo she had no prolapse and no regurgitation.  She comes back for follow-up and to get her meds renewed. She's doing relatively well. On her beta blocker she doesn't have palpitations, presyncope or syncope. She's had no chest pressure, neck or arm discomfort. She's had no weight gain or edema.    Allergies  Allergen Reactions  . Penicillins Rash    Pt called in stating she  Was prescribed Pen VK in ED and developed a generalized raised rash  . Phenytoin Sodium Extended Other (See Comments)    seizures  . Aspirin Other (See Comments)    Upset stomach, resolves with GERD meds   . Caffeine Other (See Comments)    Heart races  . Dilantin [Phenytoin Sodium Extended] Other (See Comments)    Seizure   . Food Other (See Comments)    Sodium nitrates-headaches   . Ivp Dye [Iodinated Diagnostic Agents] Hives  . Levofloxacin Hives  . Sulfa Drugs Cross Reactors Hives  . Gabapentin Rash  . Pregabalin Rash  . Sodium Nitrate Other (See Comments)    headaches    Current Outpatient Prescriptions  Medication Sig Dispense Refill  . albuterol (PROVENTIL HFA;VENTOLIN HFA) 108 (90 BASE) MCG/ACT inhaler Inhale 2 puffs into the lungs every 6 (six) hours as needed for wheezing or shortness of breath.    Marland Kitchen aspirin EC 81 MG tablet Take 1 tablet (81 mg total) by mouth daily. 30 tablet 6  . citalopram (CELEXA) 40 MG tablet Take 20 mg by mouth daily.    . lansoprazole (PREVACID) 30 MG capsule Take 1 capsule (30 mg total) by mouth daily at 12 noon. 30 capsule 0  . metoprolol succinate (TOPROL-XL) 50 MG 24 hr tablet Take 1 tablet (50 mg total) by mouth daily. Take with or immediately following a meal. 30 tablet 2   No current facility-administered medications for this visit.    Past Medical History  Diagnosis Date  . Asthma   . Back pain, chronic   .  Thrombophlebitis   . Mitral valve prolapse   . Epilepsy (HCC)   . Stenosis of lumbosacral spine     numbness L hand and L leg  . Pericarditis   . Depression   . PTSD (post-traumatic stress disorder)   . Anxiety   . Fatigue     Past Surgical History  Procedure Laterality Date  . Nose surgery      s/p trauma  . Appendectomy    . Cesarean section      x 2  . Ovary surgery    . Uterine fibroid surgery    . Breast lumpectomy    . Breast lumpectomy      Right breast  . Biopsy breast      Right breast x 10  . Rhinoplasty      ROS:  Positive for dizziness, constipation, reflux, left-sided body numbness, back and leg pain, leg swelling, asthma. Otherwise as stated in the HPI and negative for all other systems.  PHYSICAL EXAM BP 138/72 mmHg  Pulse 72  Ht  (1.6 m)  Wt 229 lb (103.874 kg)  BMI 40.58 kg/m2 GENERAL:  Well appearing NECK:  No jugular venous distention, waveform within normal limits, carotid upstroke brisk and symmetric, no bruits, no thyromegaly LYMPHATICS:  No cervical, inguinal adenopathy LUNGS:  Clear  to auscultation bilaterallybenign CHEST:  Unremarkable HEART:  PMI not displaced or sustained,S1 and S2 within normal limits, no S3, no S4, no clicks, no rubs, no murmurs ABD:  Flat, positive bowel sounds normal in frequency in pitch, no bruits, no rebound, no guarding, no midline pulsatile mass, no hepatomegaly, no splenomegaly EXT:  2 plus pulses throughout, no edema, no cyanosis no clubbing   EKG:  Sinus rhythm, rate 72, axis within normal limits, intervals within normal limits, no acute ST-T wave changes.  11/14/2014   ASSESSMENT AND PLAN  MVP: I  Again do not appreciate a click or murmur.  No further imaging is indicated at this point. She can continue with the low dose of beta blocker.

## 2014-11-14 NOTE — Patient Instructions (Signed)
Your physician wants you to follow-up in: 2 Years You will receive a reminder letter in the mail two months in advance. If you don't receive a letter, please call our office to schedule the follow-up appointment.  

## 2015-02-18 MED FILL — METOPROLOL SUCC ER 50 MG TA: 50 | 90 days supply | Qty: 90 | Fill #1

## 2015-05-14 MED FILL — METOPROLOL SUCC ER 50 MG TA: 50 | 90 days supply | Qty: 90 | Fill #2

## 2015-08-21 MED FILL — METOPROLOL SUCC ER 50 MG TA: 50 | 90 days supply | Qty: 90 | Fill #3

## 2015-11-12 ENCOUNTER — Other Ambulatory Visit: Payer: Self-pay | Admitting: Cardiology

## 2015-11-12 MED FILL — METOPROLOL SUCC ER 50 MG TA: 50 | 90 days supply | Qty: 90 | Fill #0

## 2015-12-07 ENCOUNTER — Encounter (HOSPITAL_COMMUNITY): Payer: Self-pay | Admitting: *Deleted

## 2015-12-07 ENCOUNTER — Emergency Department (HOSPITAL_COMMUNITY)
Admission: EM | Admit: 2015-12-07 | Discharge: 2015-12-07 | Disposition: A | Discharge status: Home / Self Care | Payer: PRIVATE HEALTH INSURANCE | Attending: Emergency Medicine | Admitting: Emergency Medicine

## 2015-12-07 DIAGNOSIS — Z7982 Long term (current) use of aspirin: Secondary | ICD-10-CM | POA: Insufficient documentation

## 2015-12-07 DIAGNOSIS — F17210 Nicotine dependence, cigarettes, uncomplicated: Secondary | ICD-10-CM | POA: Insufficient documentation

## 2015-12-07 DIAGNOSIS — J4521 Mild intermittent asthma with (acute) exacerbation: Secondary | ICD-10-CM | POA: Insufficient documentation

## 2015-12-07 DIAGNOSIS — R0981 Nasal congestion: Secondary | ICD-10-CM

## 2015-12-07 DIAGNOSIS — H9191 Unspecified hearing loss, right ear: Secondary | ICD-10-CM

## 2015-12-07 MED ORDER — ALBUTEROL SULFATE HFA 108 (90 BASE) MCG/ACT IN AERS: 2.0000 | INHALATION_SPRAY | RESPIRATORY_TRACT | 0 refills | Status: DC | PRN

## 2015-12-07 MED ORDER — PREDNISONE 20 MG PO TABS
40.0000 mg | ORAL_TABLET | Freq: Every day | ORAL | 0 refills | Status: DC
Start: 2015-12-07 — End: 2016-04-29

## 2015-12-07 MED ORDER — DEXAMETHASONE SODIUM PHOSPHATE 10 MG/ML IJ SOLN
10.0000 mg | Freq: Once | INTRAMUSCULAR | Status: AC
  Administered 2015-12-07: 10 mg via INTRAVENOUS
  Filled 2015-12-07: qty 1

## 2015-12-07 MED ORDER — SALINE SPRAY 0.65 % NA SOLN
1.0000 | Freq: Once | NASAL | Status: AC
  Administered 2015-12-07: 1 via NASAL
  Filled 2015-12-07: qty 44

## 2015-12-07 MED ORDER — KETOROLAC TROMETHAMINE 15 MG/ML IJ SOLN
15.0000 mg | Freq: Once | INTRAMUSCULAR | Status: AC
  Administered 2015-12-07: 15 mg via INTRAVENOUS

## 2015-12-07 MED ORDER — KETOROLAC TROMETHAMINE 15 MG/ML IJ SOLN
15.0000 mg | Freq: Once | INTRAMUSCULAR | Status: DC
  Filled 2015-12-07: qty 1

## 2015-12-07 MED ORDER — ALBUTEROL SULFATE HFA 108 (90 BASE) MCG/ACT IN AERS
2.0000 | INHALATION_SPRAY | Freq: Once | RESPIRATORY_TRACT | Status: AC
  Administered 2015-12-07: 2 via RESPIRATORY_TRACT
  Filled 2015-12-07: qty 6.7

## 2015-12-07 NOTE — ED Notes (Signed)
Pt understood dc material. NAD noted. Scripts given at dc 

## 2015-12-07 NOTE — ED Provider Notes (Signed)
[facility]-EMERGENCY DEPT Provider Note   CSN: [id number] Arrival date & time: 12/07/15  0205   By signing my name below, I, Valentino Saxon, attest that this documentation has been prepared under the direction and in the presence of Shon Baton, MD. Electronically Signed: Valentino Saxon, ED Scribe. 12/07/15. 3:00 AM.  History   Chief Complaint Chief Complaint  Patient presents with  . Shortness of Breath  . Eye Pain   The history is provided by the patient. No language interpreter was used.     HPI Comments: Sylvia Hall is a 55 y.o. female who presents to the Emergency Department complaining of 4/10, moderate, constant, right ear pain onset yesterday.  Pt states she was talking on the phone with her sister on her left ear, when suddenly her right ear hearing was decreased. She reports associated ear pressure and otalgia. Pt notes having accompanied cough with wheezing x 3-4 days. She does report having phlegm with cough. She reports having asthma and is a current smoker. She states when she tilts her head, she feels as if there is liquid in her right ear. Pt also reports having left eye soreness with purulent drainage and associated eye puffiness onset 4-5 days ago. She reports no modifying factors noted. Pt denies Hx of COPD. She also denies CP, abdominal pain, sore throat and fever. She reports Phx of mitral valve prolapse. No additional complaints at this time.   Past Medical History:  Diagnosis Date  . Anxiety   . Asthma   . Back pain, chronic   . Depression   . Epilepsy (HCC)   . Fatigue   . Mitral valve prolapse   . Pericarditis   . PTSD (post-traumatic stress disorder)   . Stenosis of lumbosacral spine    numbness L hand and L leg  . Thrombophlebitis     Patient Active Problem List   Diagnosis Date Noted  . Left sided numbness 12/01/2013  . Acute sinusitis, unspecified 12/08/2012  . Muscle spasm of right leg 12/08/2012  . Acute upper respiratory infections of  unspecified site 12/08/2012  . Major depressive disorder, recurrent episode, severe, without mention of psychotic behavior 12/07/2012  . Borderline personality disorder 10/19/2012  . Acute posttraumatic stress disorder 08/24/2012  . Palpitations 08/16/2012  . Neuropathy (HCC) 08/16/2012  . Restless leg syndrome 08/16/2012  . Routine gynecological examination 07/31/2012  . Neurogenic pain 04/12/2012  . Anxiety 11/03/2011  . Vitamin D deficiency 08/08/2011  . Chronic back pain 05/29/2011  . MVP (mitral valve prolapse) 05/29/2011  . Asthma 05/29/2011  . Tobacco user 05/29/2011  . Chronic mastitis 05/29/2011  . Allergic rhinitis 05/29/2011    Past Surgical History:  Procedure Laterality Date  . APPENDECTOMY    . BIOPSY BREAST     Right breast x 10  . BREAST LUMPECTOMY    . BREAST LUMPECTOMY     Right breast  . CESAREAN SECTION     x 2  . NOSE SURGERY     s/p trauma  . OVARY SURGERY    . RHINOPLASTY    . UTERINE FIBROID SURGERY      OB History    Gravida Para Term Preterm AB Living   2 2 0 2   2   SAB TAB Ectopic Multiple Live Births                   Home Medications    Prior to Admission medications   Medication Sig Start Date End Date  Taking? Authorizing Provider  albuterol (PROVENTIL HFA;VENTOLIN HFA) 108 (90 BASE) MCG/ACT inhaler Inhale 2 puffs into the lungs every 6 (six) hours as needed for wheezing or shortness of breath.   Yes Historical Provider, MD  aspirin EC 81 MG tablet Take 1 tablet (81 mg total) by mouth daily. 12/04/13  Yes Denton Brickiana M Truong, MD  metoprolol succinate (TOPROL-XL) 50 MG 24 hr tablet TAKE 1 TABLET BY MOUTH DAILY. TAKE WITH OR IMMEDIATELY FOLLOWING A MEAL. 11/12/15  Yes Rollene RotundaJames Hochrein, MD  albuterol (PROVENTIL HFA;VENTOLIN HFA) 108 (90 Base) MCG/ACT inhaler Inhale 2 puffs into the lungs every 4 (four) hours as needed for wheezing or shortness of breath. 12/07/15   Shon Batonourtney F Farin Buhman, MD  predniSONE (DELTASONE) 20 MG tablet Take 2 tablets (40 mg  total) by mouth daily. 12/07/15   Shon Batonourtney F Omesha Bowerman, MD    Family History Family History  Problem Relation Age of Onset  . Diabetes Mother   . Mitral valve prolapse Mother   . Asthma Mother   . CAD Mother 3073  . Anxiety disorder Mother   . Mitral valve prolapse Sister   . Asthma Sister   . Mitral valve prolapse Son   . Alcohol abuse Son   . Diabetes Maternal Uncle   . Diabetes Maternal Grandmother   . Mitral valve prolapse Sister   . CAD Son 3023    No details available  . Alcohol abuse Father   . Alcohol abuse Brother     Social History Social History  Substance Use Topics  . Smoking status: Current Every Day Smoker    Packs/day: 0.50    Years: 12.00    Types: Cigarettes  . Smokeless tobacco: Never Used  . Alcohol use No     Allergies   Penicillins; Phenytoin sodium extended; Aspirin; Caffeine; Dilantin [phenytoin sodium extended]; Food; Ivp dye [iodinated diagnostic agents]; Levofloxacin; Sulfa drugs cross reactors; Gabapentin; Pregabalin; and Sodium nitrate   Review of Systems Review of Systems  Constitutional: Negative for fever.  HENT: Positive for ear pain and sinus pressure (ear). Negative for sore throat.   Eyes: Positive for pain (left) and discharge (pus).       + left eye puffiness   Respiratory: Positive for cough and wheezing.   Cardiovascular: Negative for chest pain.  Gastrointestinal: Negative for abdominal pain.  Musculoskeletal: Positive for neck pain.  All other systems reviewed and are negative.    Physical Exam Updated Vital Signs BP 122/73   Pulse 92   Temp 98.6 F (37 C) (Oral)   Resp 17   Ht 5\' 3"  (1.6 m)   Wt 217 lb 4.8 oz (98.6 kg)   SpO2 99%   BMI 38.49 kg/m   Physical Exam  Constitutional: She is oriented to person, place, and time. She appears well-developed and well-nourished. No distress.  HENT:  Head: Normocephalic and atraumatic.  Tenderness to palpation bilateral mastoid sinuses, bilateral TMs clear, oropharynx  clear without exudate, tenderness to palpation along the angle of the right mandible and TMJ, no obvious swelling or overlying skin changes  Eyes: Pupils are equal, round, and reactive to light.  Neck: Normal range of motion. Neck supple.  Cardiovascular: Normal rate, regular rhythm and normal heart sounds.   No murmur heard. Pulmonary/Chest: Effort normal. No respiratory distress. She has wheezes.  Scant diffuse expiratory wheezing  Abdominal: Soft. There is no tenderness.  Lymphadenopathy:    She has no cervical adenopathy.  Neurological: She is alert and oriented to person, place,  and time.  Skin: Skin is warm and dry.  Psychiatric: She has a normal mood and affect.  Nursing note and vitals reviewed.    ED Treatments / Results   DIAGNOSTIC STUDIES: Oxygen Saturation is 99% on RA, normal by my interpretation.    COORDINATION OF CARE: 2:42 AM Discussed treatment plan with pt at bedside which includes inhaler, steriods and pt agreed to plan.  Labs (all labs ordered are listed, but only abnormal results are displayed) Labs Reviewed - No data to display  EKG  EKG Interpretation None       Radiology No results found.  Procedures Procedures (including critical care time)  Medications Ordered in ED Medications  dexamethasone (DECADRON) injection 10 mg (10 mg Intravenous Given 12/07/15 0329)  albuterol (PROVENTIL HFA;VENTOLIN HFA) 108 (90 Base) MCG/ACT inhaler 2 puff (2 puffs Inhalation Given 12/07/15 0329)  sodium chloride (OCEAN) 0.65 % nasal spray 1 spray (1 spray Each Nare Given 12/07/15 0329)  ketorolac (TORADOL) 15 MG/ML injection 15 mg (15 mg Intravenous Given 12/07/15 0329)     Initial Impression / Assessment and Plan / ED Course  I have reviewed the triage vital signs and the nursing notes.  Pertinent labs & imaging results that were available during my care of the patient were reviewed by me and considered in my medical decision making (see chart for  details).  Clinical Course    Patient presents with right ear pain and decreased hearing. Acute in onset. Reports recent upper respiratory symptoms including facial pain, left eye drainage, and wheezing. She is nontoxic. Afebrile. Exam is largely benign with the exception of wheezing. She was given Decadron and Toradol. Suspect upper respiratory viral infection causing otalgia and decreased hearing. Supportive care at home. Prednisone and albuterol for wheezing. Nasal saline and Flonase to clear out nasal passages and eustachian tubes.  After history, exam, and medical workup I feel the patient has been appropriately medically screened and is safe for discharge home. Pertinent diagnoses were discussed with the patient. Patient was given return precautions.   Final Clinical Impressions(s) / ED Diagnoses   Final diagnoses:  Sinus congestion  Decreased hearing of right ear  Mild intermittent asthma with exacerbation    New Prescriptions New Prescriptions   ALBUTEROL (PROVENTIL HFA;VENTOLIN HFA) 108 (90 BASE) MCG/ACT INHALER    Inhale 2 puffs into the lungs every 4 (four) hours as needed for wheezing or shortness of breath.   PREDNISONE (DELTASONE) 20 MG TABLET    Take 2 tablets (40 mg total) by mouth daily.   I personally performed the services described in this documentation, which was scribed in my presence. The recorded information has been reviewed and is accurate.     Shon Batonourtney F Pascale Maves, MD 12/07/15 [phone]

## 2015-12-07 NOTE — ED Triage Notes (Signed)
Patient presents with c/o SOB, left eye was swollen and then it drained and now her right eye has a ringing sound

## 2015-12-07 NOTE — ED Notes (Signed)
The pt is c/o rt ear rt side of her face feels numb  Lt eye was draining 4 days ago and she has a lump in her upper eyelid.  Her rt ear feels clogged.  She has been ill with numerus sinus infections recently  Alert no distress.. As an after though she reports sob  None observed at presentg

## 2015-12-07 NOTE — Discharge Instructions (Signed)
Your seen today for decreased hearing, sinus pressure, wheezing. This is all likely related to a virus.  Supportive measures at home. Continue nasal saline. Albuterol and you'll be given a short course of steroids.

## 2015-12-08 MED FILL — VENTOLIN HFA 90 MCG INHALER: 108 (90 BAS | 17 days supply | Qty: 18 | Fill #0

## 2016-02-12 MED FILL — METOPROLOL SUCC ER 50 MG TA: 50 | 90 days supply | Qty: 90 | Fill #1

## 2016-04-29 ENCOUNTER — Ambulatory Visit (HOSPITAL_COMMUNITY)
Admission: EM | Admit: 2016-04-29 | Discharge: 2016-04-29 | Disposition: A | Discharge status: Home / Self Care | Payer: PRIVATE HEALTH INSURANCE | Attending: Family Medicine | Admitting: Family Medicine

## 2016-04-29 ENCOUNTER — Encounter (HOSPITAL_COMMUNITY): Payer: Self-pay | Admitting: Emergency Medicine

## 2016-04-29 DIAGNOSIS — J0100 Acute maxillary sinusitis, unspecified: Secondary | ICD-10-CM

## 2016-04-29 MED ORDER — AMOXICILLIN-POT CLAVULANATE 875-125 MG PO TABS: 1.0000 | ORAL_TABLET | Freq: Two times a day (BID) | ORAL | 0 refills | Status: DC

## 2016-04-29 MED ORDER — HYDROCODONE-ACETAMINOPHEN 5-325 MG PO TABS: 1.0000 | ORAL_TABLET | Freq: Four times a day (QID) | ORAL | 0 refills | Status: DC | PRN

## 2016-04-29 MED ORDER — MECLIZINE HCL 12.5 MG PO TABS: 12.5000 mg | ORAL_TABLET | Freq: Three times a day (TID) | ORAL | 0 refills | Status: DC | PRN

## 2016-04-29 MED FILL — HYDROCODON-APAP 5-325: 5-325 | 3 days supply | Qty: 12 | Fill #0

## 2016-04-29 MED FILL — AMOX-CLAV 875-125 MG TABLET: 875-125 | 10 days supply | Qty: 20 | Fill #0

## 2016-04-29 NOTE — Discharge Instructions (Signed)
Continue the saline nasal wash

## 2016-04-29 NOTE — ED Provider Notes (Signed)
[facility]    CSN: [id number] Arrival date & time: 04/29/16  1310     History   Chief Complaint Chief Complaint  Patient presents with  . URI    HPI Sylvia DesanctisBarbara Hall is a 56 y.o. female.   Pt c/o cold sx onset: 3 weeks  Sx include: bilateral eye pain, facial pressure, right ear pain, dizziness, dry cough, nasal congestion/drainage and teeth pain and fevers  Taking: OTC cold meds w/temp relief.... Last had ibup around 1300  Reports she smokes 1 PPD.  Husband is having bypass surgery tomorrow.  A&O x4... NAD      Past Medical History:  Diagnosis Date  . Anxiety   . Asthma   . Back pain, chronic   . Depression   . Epilepsy (HCC)   . Fatigue   . Mitral valve prolapse   . Pericarditis   . PTSD (post-traumatic stress disorder)   . Stenosis of lumbosacral spine    numbness L hand and L leg  . Thrombophlebitis     Patient Active Problem List   Diagnosis Date Noted  . Left sided numbness 12/01/2013  . Acute sinusitis, unspecified 12/08/2012  . Muscle spasm of right leg 12/08/2012  . Acute upper respiratory infections of unspecified site 12/08/2012  . Major depressive disorder, recurrent episode, severe, without mention of psychotic behavior 12/07/2012  . Borderline personality disorder 10/19/2012  . Acute posttraumatic stress disorder 08/24/2012  . Palpitations 08/16/2012  . Neuropathy (HCC) 08/16/2012  . Restless leg syndrome 08/16/2012  . Routine gynecological examination 07/31/2012  . Neurogenic pain 04/12/2012  . Anxiety 11/03/2011  . Vitamin D deficiency 08/08/2011  . Chronic back pain 05/29/2011  . MVP (mitral valve prolapse) 05/29/2011  . Asthma 05/29/2011  . Tobacco user 05/29/2011  . Chronic mastitis 05/29/2011  . Allergic rhinitis 05/29/2011    Past Surgical History:  Procedure Laterality Date  . APPENDECTOMY    . BIOPSY BREAST     Right breast x 10  . BREAST LUMPECTOMY    . BREAST LUMPECTOMY     Right breast  . CESAREAN  SECTION     x 2  . NOSE SURGERY     s/p trauma  . OVARY SURGERY    . RHINOPLASTY    . UTERINE FIBROID SURGERY      OB History    Gravida Para Term Preterm AB Living   2 2 0 2   2   SAB TAB Ectopic Multiple Live Births                   Home Medications    Prior to Admission medications   Medication Sig Start Date End Date Taking? Authorizing Provider  aspirin EC 81 MG tablet Take 1 tablet (81 mg total) by mouth daily. 12/04/13  Yes Denton Brickiana M Truong, MD  metoprolol succinate (TOPROL-XL) 50 MG 24 hr tablet TAKE 1 TABLET BY MOUTH DAILY. TAKE WITH OR IMMEDIATELY FOLLOWING A MEAL. 11/12/15  Yes Rollene RotundaJames Hochrein, MD  albuterol (PROVENTIL HFA;VENTOLIN HFA) 108 (90 BASE) MCG/ACT inhaler Inhale 2 puffs into the lungs every 6 (six) hours as needed for wheezing or shortness of breath.    Historical Provider, MD  albuterol (PROVENTIL HFA;VENTOLIN HFA) 108 (90 Base) MCG/ACT inhaler Inhale 2 puffs into the lungs every 4 (four) hours as needed for wheezing or shortness of breath. 12/07/15   Shon Batonourtney F Horton, MD  amoxicillin-clavulanate (AUGMENTIN) 875-125 MG tablet Take 1 tablet by mouth every 12 (twelve) hours. 04/29/16  Elvina Sidle, MD  HYDROcodone-acetaminophen (NORCO) 5-325 MG tablet Take 1 tablet by mouth every 6 (six) hours as needed for moderate pain. 04/29/16   Elvina Sidle, MD  meclizine (ANTIVERT) 12.5 MG tablet Take 1 tablet (12.5 mg total) by mouth 3 (three) times daily as needed for dizziness. 04/29/16   Elvina Sidle, MD    Family History Family History  Problem Relation Age of Onset  . Diabetes Mother   . Mitral valve prolapse Mother   . Asthma Mother   . CAD Mother 47  . Anxiety disorder Mother   . Mitral valve prolapse Sister   . Asthma Sister   . Mitral valve prolapse Son   . Alcohol abuse Son   . Diabetes Maternal Uncle   . Diabetes Maternal Grandmother   . Mitral valve prolapse Sister   . CAD Son 74    No details available  . Alcohol abuse Father   . Alcohol  abuse Brother     Social History Social History  Substance Use Topics  . Smoking status: Current Every Day Smoker    Packs/day: 0.50    Years: 12.00    Types: Cigarettes  . Smokeless tobacco: Never Used  . Alcohol use No     Allergies   Penicillins; Phenytoin sodium extended; Aspirin; Caffeine; Dilantin [phenytoin sodium extended]; Food; Ivp dye [iodinated diagnostic agents]; Levofloxacin; Sulfa drugs cross reactors; Gabapentin; Pregabalin; and Sodium nitrate   Review of Systems Review of Systems  Constitutional: Positive for fatigue.  HENT: Positive for congestion, dental problem, sinus pain and sinus pressure.   Respiratory: Positive for cough.   Cardiovascular: Negative.   Gastrointestinal: Negative.   Genitourinary: Negative.   Neurological: Negative.      Physical Exam Triage Vital Signs ED Triage Vitals [04/29/16 1338]  Enc Vitals Group     BP 117/79     Pulse Rate 86     Resp 16     Temp 98 F (36.7 C)     Temp Source Oral     SpO2 95 %     Weight      Height      Head Circumference      Peak Flow      Pain Score 6     Pain Loc      Pain Edu?      Excl. in GC?    No data found.   Updated Vital Signs BP 117/79 (BP Location: Right Arm)   Pulse 86   Temp 98 F (36.7 C) (Oral)   Resp 16   SpO2 95%   Physical Exam  Constitutional: She is oriented to person, place, and time. She appears well-developed and well-nourished.  HENT:  Head: Normocephalic.  Right Ear: External ear normal.  Left Ear: External ear normal.  Mouth/Throat: Oropharynx is clear and moist.  Reddened posterior pharynx Bilateral nasal passage swelling  Eyes: Conjunctivae and EOM are normal. Pupils are equal, round, and reactive to light.  Neck: Normal range of motion. Neck supple.  Cardiovascular: Normal rate, regular rhythm and normal heart sounds.   Pulmonary/Chest: Effort normal and breath sounds normal.  Musculoskeletal: Normal range of motion.  Neurological: She is  alert and oriented to person, place, and time.  Skin: Skin is warm and dry.  Nursing note and vitals reviewed.    UC Treatments / Results  Labs (all labs ordered are listed, but only abnormal results are displayed) Labs Reviewed - No data to display  EKG  EKG Interpretation  None       Radiology No results found.  Procedures Procedures (including critical care time)  Medications Ordered in UC Medications - No data to display   Initial Impression / Assessment and Plan / UC Course  I have reviewed the triage vital signs and the nursing notes.  Pertinent labs & imaging results that were available during my care of the patient were reviewed by me and considered in my medical decision making (see chart for details).       Final Clinical Impressions(s) / UC Diagnoses   Final diagnoses:  Acute maxillary sinusitis, recurrence not specified    New Prescriptions New Prescriptions   AMOXICILLIN-CLAVULANATE (AUGMENTIN) 875-125 MG TABLET    Take 1 tablet by mouth every 12 (twelve) hours.   HYDROCODONE-ACETAMINOPHEN (NORCO) 5-325 MG TABLET    Take 1 tablet by mouth every 6 (six) hours as needed for moderate pain.   MECLIZINE (ANTIVERT) 12.5 MG TABLET    Take 1 tablet (12.5 mg total) by mouth 3 (three) times daily as needed for dizziness.     Elvina Sidle, MD 04/29/16 1357

## 2016-04-29 NOTE — ED Triage Notes (Signed)
Pt c/o cold sx onset: 3 weeks  Sx include: bilateral eye pain, facial pressure, right ear pain, dizziness, dry cough, nasal congestion/drainage and teeth pain and fevers  Taking: OTC cold meds w/temp relief.... Last had ibup around 1300  Reports she smokes 1 PPD  A&O x4... NAD

## 2016-05-17 MED FILL — METOPROLOL SUCC ER 50 MG TA: 50 | 90 days supply | Qty: 90 | Fill #2

## 2016-07-12 ENCOUNTER — Ambulatory Visit (HOSPITAL_COMMUNITY)
Admission: EM | Admit: 2016-07-12 | Discharge: 2016-07-12 | Disposition: A | Discharge status: Home / Self Care | Payer: PRIVATE HEALTH INSURANCE | Attending: Emergency Medicine | Admitting: Emergency Medicine

## 2016-07-12 ENCOUNTER — Encounter (HOSPITAL_COMMUNITY): Payer: Self-pay | Admitting: Emergency Medicine

## 2016-07-12 DIAGNOSIS — J320 Chronic maxillary sinusitis: Secondary | ICD-10-CM

## 2016-07-12 MED ORDER — AMOXICILLIN-POT CLAVULANATE 875-125 MG PO TABS: 1.0000 | ORAL_TABLET | Freq: Two times a day (BID) | ORAL | 0 refills | Status: DC

## 2016-07-12 MED ORDER — MECLIZINE HCL 12.5 MG PO TABS: 12.5000 mg | ORAL_TABLET | Freq: Three times a day (TID) | ORAL | 0 refills | Status: DC | PRN

## 2016-07-12 MED ORDER — ALBUTEROL SULFATE HFA 108 (90 BASE) MCG/ACT IN AERS: 2.0000 | INHALATION_SPRAY | Freq: Four times a day (QID) | RESPIRATORY_TRACT | 1 refills | Status: DC | PRN

## 2016-07-12 NOTE — ED Triage Notes (Signed)
The [facility] with a complaint of sinus pain and pressure and right ear pain and dizziness.

## 2016-07-12 NOTE — ED Provider Notes (Signed)
CSN: [id number]     Arrival date & time 07/12/16  1724 History   None    Chief Complaint  Patient presents with  . Facial Pain   (Consider location/radiation/quality/duration/timing/severity/associated sxs/prior Treatment) Pt complains of sinus pressure and ear pain.  Pt complains of feeling dizzy.  Pt has a history of dizziness in the past with infections.   The history is provided by the patient. No language interpreter was used.  Cough  Cough characteristics:  Productive Sputum characteristics:  Nondescript Severity:  Moderate Onset quality:  Gradual Timing:  Constant Progression:  Worsening Chronicity:  New Smoker: no   Relieved by:  Nothing Worsened by:  Nothing Ineffective treatments:  None tried Associated symptoms: no fever   Risk factors: no recent infection     Past Medical History:  Diagnosis Date  . Anxiety   . Asthma   . Back pain, chronic   . Depression   . Epilepsy (HCC)   . Fatigue   . Mitral valve prolapse   . Pericarditis   . PTSD (post-traumatic stress disorder)   . Stenosis of lumbosacral spine    numbness L hand and L leg  . Thrombophlebitis    Past Surgical History:  Procedure Laterality Date  . APPENDECTOMY    . BIOPSY BREAST     Right breast x 10  . BREAST LUMPECTOMY    . BREAST LUMPECTOMY     Right breast  . CESAREAN SECTION     x 2  . NOSE SURGERY     s/p trauma  . OVARY SURGERY    . RHINOPLASTY    . UTERINE FIBROID SURGERY     Family History  Problem Relation Age of Onset  . Diabetes Mother   . Mitral valve prolapse Mother   . Asthma Mother   . CAD Mother 7473  . Anxiety disorder Mother   . Mitral valve prolapse Sister   . Asthma Sister   . Mitral valve prolapse Son   . Alcohol abuse Son   . Diabetes Maternal Uncle   . Diabetes Maternal Grandmother   . Mitral valve prolapse Sister   . CAD Son 1223       No details available  . Alcohol abuse Father   . Alcohol abuse Brother    Social History  Substance Use Topics  .  Smoking status: Current Every Day Smoker    Packs/day: 0.50    Years: 12.00    Types: Cigarettes  . Smokeless tobacco: Never Used  . Alcohol use No   OB History    Gravida Para Term Preterm AB Living   2 2 0 2   2   SAB TAB Ectopic Multiple Live Births                 Review of Systems  Constitutional: Negative for fever.  Respiratory: Positive for cough.   All other systems reviewed and are negative.   Allergies  Penicillins; Phenytoin sodium extended; Aspirin; Caffeine; Dilantin [phenytoin sodium extended]; Food; Ivp dye [iodinated diagnostic agents]; Levofloxacin; Sulfa drugs cross reactors; Gabapentin; Pregabalin; and Sodium nitrate  Home Medications   Prior to Admission medications   Medication Sig Start Date End Date Taking? Authorizing Provider  albuterol (PROVENTIL HFA;VENTOLIN HFA) 108 (90 Base) MCG/ACT inhaler Inhale 2 puffs into the lungs every 6 (six) hours as needed for wheezing or shortness of breath. 07/12/16   Elson AreasSofia, Marsalis Beaulieu K, PA-C  amoxicillin-clavulanate (AUGMENTIN) 875-125 MG tablet Take 1 tablet by mouth every  12 (twelve) hours. 07/12/16   Elson Areas, PA-C  aspirin EC 81 MG tablet Take 1 tablet (81 mg total) by mouth daily. 12/04/13   Denton Brick, MD  HYDROcodone-acetaminophen (NORCO) 5-325 MG tablet Take 1 tablet by mouth every 6 (six) hours as needed for moderate pain. 04/29/16   Elvina Sidle, MD  meclizine (ANTIVERT) 12.5 MG tablet Take 1 tablet (12.5 mg total) by mouth 3 (three) times daily as needed for dizziness. 07/12/16   Elson Areas, PA-C  metoprolol succinate (TOPROL-XL) 50 MG 24 hr tablet TAKE 1 TABLET BY MOUTH DAILY. TAKE WITH OR IMMEDIATELY FOLLOWING A MEAL. 11/12/15   Rollene Rotunda, MD   Meds Ordered and Administered this Visit  Medications - No data to display  BP 120/68 (BP Location: Right Arm)   Pulse 71   Temp 97.8 F (36.6 C) (Oral)   Resp 18   SpO2 98%  No data found.   Physical Exam  Constitutional: She appears  well-developed and well-nourished.  HENT:  Head: Normocephalic.  Right Ear: External ear normal.  Left Ear: External ear normal.  Nose: Nose normal.  Mouth/Throat: Oropharynx is clear and moist.  Tender maxillary and frontal sinuses  Eyes: Pupils are equal, round, and reactive to light.  Neck: Normal range of motion.  Cardiovascular: Normal rate and regular rhythm.   Pulmonary/Chest: Effort normal and breath sounds normal.  Abdominal: Soft.  Musculoskeletal: Normal range of motion.  Neurological: She is alert.  Skin: Skin is warm.  Psychiatric: She has a normal mood and affect.  Nursing note and vitals reviewed.   Urgent Care Course     Procedures (including critical care time)  Labs Review Labs Reviewed - No data to display  Imaging Review No results found.   Visual Acuity Review  Right Eye Distance:   Left Eye Distance:   Bilateral Distance:    Right Eye Near:   Left Eye Near:    Bilateral Near:         MDM   1. Chronic maxillary sinusitis    An After Visit Summary was printed and given to the patient. Meds ordered this encounter  Medications  . albuterol (PROVENTIL HFA;VENTOLIN HFA) 108 (90 Base) MCG/ACT inhaler    Sig: Inhale 2 puffs into the lungs every 6 (six) hours as needed for wheezing or shortness of breath.    Dispense:  1 Inhaler    Refill:  1    Order Specific Question:   Supervising Provider    Answer:   Domenick Gong [[id number]]  . meclizine (ANTIVERT) 12.5 MG tablet    Sig: Take 1 tablet (12.5 mg total) by mouth 3 (three) times daily as needed for dizziness.    Dispense:  30 tablet    Refill:  0    Order Specific Question:   Supervising Provider    Answer:   Domenick Gong [[id number]]  . amoxicillin-clavulanate (AUGMENTIN) 875-125 MG tablet    Sig: Take 1 tablet by mouth every 12 (twelve) hours.    Dispense:  20 tablet    Refill:  0    Order Specific Question:   Supervising Provider    Answer:   Domenick Gong [[id number]]       Elson Areas, PA-C 07/12/16 2019

## 2016-07-12 NOTE — Discharge Instructions (Signed)
Return if any problems.

## 2016-08-13 MED FILL — METOPROLOL SUCC ER 50 MG TA: 50 | 90 days supply | Qty: 90 | Fill #3

## 2016-11-05 ENCOUNTER — Encounter (HOSPITAL_COMMUNITY): Payer: Self-pay | Admitting: Family Medicine

## 2016-11-05 ENCOUNTER — Ambulatory Visit (HOSPITAL_COMMUNITY)
Admission: EM | Admit: 2016-11-05 | Discharge: 2016-11-05 | Disposition: A | Discharge status: Home / Self Care | Payer: PRIVATE HEALTH INSURANCE | Attending: Internal Medicine | Admitting: Internal Medicine

## 2016-11-05 DIAGNOSIS — R05 Cough: Secondary | ICD-10-CM

## 2016-11-05 DIAGNOSIS — R509 Fever, unspecified: Secondary | ICD-10-CM

## 2016-11-05 DIAGNOSIS — R059 Cough, unspecified: Secondary | ICD-10-CM

## 2016-11-05 DIAGNOSIS — J069 Acute upper respiratory infection, unspecified: Secondary | ICD-10-CM

## 2016-11-05 DIAGNOSIS — J209 Acute bronchitis, unspecified: Secondary | ICD-10-CM

## 2016-11-05 MED ORDER — PREDNISONE 5 MG (48) PO TBPK: ORAL_TABLET | ORAL | 0 refills | Status: DC

## 2016-11-05 MED ORDER — AZITHROMYCIN 250 MG PO TABS: 250.0000 mg | ORAL_TABLET | Freq: Every day | ORAL | 0 refills | Status: DC

## 2016-11-05 MED ORDER — ALBUTEROL SULFATE HFA 108 (90 BASE) MCG/ACT IN AERS: 2.0000 | INHALATION_SPRAY | Freq: Four times a day (QID) | RESPIRATORY_TRACT | 1 refills | Status: DC | PRN

## 2016-11-05 NOTE — ED Triage Notes (Signed)
Pt here for fever, sore throat, dry cough, headache since Tuesday,

## 2016-11-05 NOTE — Discharge Instructions (Signed)
You most likely have a mild bronchitis and upper respiratory infection. Covering you with an antibiotic. Use the prednisone pack for inflammation. May use Delsym for cough. Plain Mucinex  as directed. Albuterol every 6 hours for cough will help as well. Stay hydrated and f/u as needed. If worsens f/u.

## 2016-11-05 NOTE — ED Provider Notes (Signed)
[facility]    CSN: [id number] Arrival date & time: 11/05/16  1741     History   Chief Complaint Chief Complaint  Patient presents with  . Fever  . Cough    HPI Sylvia Hall is a 57 y.o. female.   56 yo presents with dry cough, fevers (up to 101), malaise, sore throat and headaches. Onset since Monday without improvement. She is a long time smoker. She has a remote history of "summer asthma". At times she feels that her lungs are "inflammed". She is using OTC regimens without benefit.       Past Medical History:  Diagnosis Date  . Anxiety   . Asthma   . Back pain, chronic   . Depression   . Epilepsy (HCC)   . Fatigue   . Mitral valve prolapse   . Pericarditis   . PTSD (post-traumatic stress disorder)   . Stenosis of lumbosacral spine    numbness L hand and L leg  . Thrombophlebitis     Patient Active Problem List   Diagnosis Date Noted  . Left sided numbness 12/01/2013  . Acute sinusitis, unspecified 12/08/2012  . Muscle spasm of right leg 12/08/2012  . Acute upper respiratory infections of unspecified site 12/08/2012  . Major depressive disorder, recurrent episode, severe, without mention of psychotic behavior 12/07/2012  . Borderline personality disorder 10/19/2012  . Acute posttraumatic stress disorder 08/24/2012  . Palpitations 08/16/2012  . Neuropathy 08/16/2012  . Restless leg syndrome 08/16/2012  . Routine gynecological examination 07/31/2012  . Neurogenic pain 04/12/2012  . Anxiety 11/03/2011  . Vitamin D deficiency 08/08/2011  . Chronic back pain 05/29/2011  . MVP (mitral valve prolapse) 05/29/2011  . Asthma 05/29/2011  . Tobacco user 05/29/2011  . Chronic mastitis 05/29/2011  . Allergic rhinitis 05/29/2011    Past Surgical History:  Procedure Laterality Date  . APPENDECTOMY    . BIOPSY BREAST     Right breast x 10  . BREAST LUMPECTOMY    . BREAST LUMPECTOMY     Right breast  . CESAREAN SECTION     x 2  . NOSE SURGERY       s/p trauma  . OVARY SURGERY    . RHINOPLASTY    . UTERINE FIBROID SURGERY      OB History    Gravida Para Term Preterm AB Living   2 2 0 2   2   SAB TAB Ectopic Multiple Live Births                   Home Medications    Prior to Admission medications   Medication Sig Start Date End Date Taking? Authorizing Provider  albuterol (PROVENTIL HFA;VENTOLIN HFA) 108 (90 Base) MCG/ACT inhaler Inhale 2 puffs into the lungs every 6 (six) hours as needed for wheezing or shortness of breath. 11/05/16   Riki Sheer, PA-C  amoxicillin-clavulanate (AUGMENTIN) 875-125 MG tablet Take 1 tablet by mouth every 12 (twelve) hours. 07/12/16   Elson Areas, PA-C  aspirin EC 81 MG tablet Take 1 tablet (81 mg total) by mouth daily. 12/04/13   Denton Brick, MD  azithromycin (ZITHROMAX) 250 MG tablet Take 1 tablet (250 mg total) by mouth daily. Take first 2 tablets together, then 1 every day until finished. 11/05/16   Riki Sheer, PA-C  HYDROcodone-acetaminophen (NORCO) 5-325 MG tablet Take 1 tablet by mouth every 6 (six) hours as needed for moderate pain. 04/29/16   Elvina Sidle, MD  meclizine (ANTIVERT) 12.5 MG tablet Take 1 tablet (12.5 mg total) by mouth 3 (three) times daily as needed for dizziness. 07/12/16   Elson Areas, PA-C  metoprolol succinate (TOPROL-XL) 50 MG 24 hr tablet TAKE 1 TABLET BY MOUTH DAILY. TAKE WITH OR IMMEDIATELY FOLLOWING A MEAL. 11/12/15   Rollene Rotunda, MD  predniSONE (STERAPRED UNI-PAK 48 TAB) 5 MG (48) TBPK tablet As directed 11/05/16   Riki Sheer, PA-C    Family History Family History  Problem Relation Age of Onset  . Diabetes Mother   . Mitral valve prolapse Mother   . Asthma Mother   . CAD Mother 72  . Anxiety disorder Mother   . Mitral valve prolapse Sister   . Asthma Sister   . Mitral valve prolapse Son   . Alcohol abuse Son   . Diabetes Maternal Uncle   . Diabetes Maternal Grandmother   . Mitral valve prolapse Sister   . CAD Son 81        No details available  . Alcohol abuse Father   . Alcohol abuse Brother     Social History Social History  Substance Use Topics  . Smoking status: Current Every Day Smoker    Packs/day: 0.50    Years: 12.00    Types: Cigarettes  . Smokeless tobacco: Never Used  . Alcohol use No     Allergies   Penicillins; Phenytoin sodium extended; Aspirin; Caffeine; Dilantin [phenytoin sodium extended]; Food; Ivp dye [iodinated diagnostic agents]; Levofloxacin; Sulfa drugs cross reactors; Gabapentin; Pregabalin; and Sodium nitrate   Review of Systems Review of Systems  Constitutional: Positive for fatigue and fever.  HENT: Positive for congestion, sinus pain and sore throat. Negative for sinus pressure.   Respiratory: Positive for cough. Negative for wheezing.   Skin: Negative.      Physical Exam Triage Vital Signs ED Triage Vitals [11/05/16 1851]  Enc Vitals Group     BP 112/62     Pulse Rate 72     Resp 18     Temp 97.9 F (36.6 C)     Temp Source Oral     SpO2 100 %     Weight      Height      Head Circumference      Peak Flow      Pain Score 5     Pain Loc      Pain Edu?      Excl. in GC?    No data found.   Updated Vital Signs BP 112/62 (BP Location: Right Arm)   Pulse 72   Temp 97.9 F (36.6 C) (Oral)   Resp 18   SpO2 100%   Visual Acuity Right Eye Distance:   Left Eye Distance:   Bilateral Distance:    Right Eye Near:   Left Eye Near:    Bilateral Near:     Physical Exam  Constitutional: She is oriented to person, place, and time. She appears well-developed and well-nourished. No distress.  Cardiovascular: Normal rate and regular rhythm.   Pulmonary/Chest: Effort normal. She has wheezes. She has no rales. She exhibits no tenderness.  Expiratory fine wheeze in both bases. No crackles are noted  Neurological: She is alert and oriented to person, place, and time.  Skin: Skin is warm and dry. She is not diaphoretic.  Psychiatric: Her behavior is  normal.  Nursing note and vitals reviewed.    UC Treatments / Results  Labs (all labs ordered are listed, but  only abnormal results are displayed) Labs Reviewed - No data to display  EKG  EKG Interpretation None       Radiology No results found.  Procedures Procedures (including critical care time)  Medications Ordered in UC Medications - No data to display   Initial Impression / Assessment and Plan / UC Course  I have reviewed the triage vital signs and the nursing notes.  Pertinent labs & imaging results that were available during my care of the patient were reviewed by me and considered in my medical decision making (see chart for details).     Mild Bronchitis with +/- bacterial component in a long term smoker with underlying asthma vs COPD history. Cover with antibiotic. Treat with albuterol, prednisone pack and supportive care. Patient is non-toxic and stable for D/C. She is instructed to f/u if worsens.   Final Clinical Impressions(s) / UC Diagnoses   Final diagnoses:  Acute bronchitis, unspecified organism  Fever, unspecified fever cause  Cough  Acute upper respiratory infection    New Prescriptions Current Discharge Medication List    START taking these medications   Details  azithromycin (ZITHROMAX) 250 MG tablet Take 1 tablet (250 mg total) by mouth daily. Take first 2 tablets together, then 1 every day until finished. Qty: 6 tablet, Refills: 0    predniSONE (STERAPRED UNI-PAK 48 TAB) 5 MG (48) TBPK tablet As directed Qty: 21 tablet, Refills: 0         Controlled Substance Prescriptions [place] Controlled Substance Registry consulted? Not Applicable   Sharin Mons 11/05/16 1958

## 2016-11-10 MED FILL — METOPROLOL SUCC ER 50 MG TA: 50 | 90 days supply | Qty: 90 | Fill #4

## 2016-12-09 ENCOUNTER — Encounter (HOSPITAL_COMMUNITY): Payer: Self-pay | Admitting: Emergency Medicine

## 2016-12-09 ENCOUNTER — Emergency Department (HOSPITAL_COMMUNITY)
Admission: EM | Admit: 2016-12-09 | Discharge: 2016-12-09 | Disposition: A | Discharge status: Home / Self Care | Payer: PRIVATE HEALTH INSURANCE | Attending: Emergency Medicine | Admitting: Emergency Medicine

## 2016-12-09 DIAGNOSIS — F17210 Nicotine dependence, cigarettes, uncomplicated: Secondary | ICD-10-CM | POA: Insufficient documentation

## 2016-12-09 DIAGNOSIS — G8929 Other chronic pain: Secondary | ICD-10-CM | POA: Insufficient documentation

## 2016-12-09 DIAGNOSIS — Z79899 Other long term (current) drug therapy: Secondary | ICD-10-CM | POA: Insufficient documentation

## 2016-12-09 DIAGNOSIS — J45909 Unspecified asthma, uncomplicated: Secondary | ICD-10-CM | POA: Insufficient documentation

## 2016-12-09 DIAGNOSIS — J0101 Acute recurrent maxillary sinusitis: Secondary | ICD-10-CM | POA: Insufficient documentation

## 2016-12-09 MED ORDER — AMOXICILLIN-POT CLAVULANATE 875-125 MG PO TABS: 1.0000 | ORAL_TABLET | Freq: Two times a day (BID) | ORAL | 0 refills | Status: AC

## 2016-12-09 MED ORDER — FLUTICASONE PROPIONATE 50 MCG/ACT NA SUSP: 1.0000 | Freq: Every day | NASAL | 2 refills | Status: DC

## 2016-12-09 NOTE — Discharge Instructions (Signed)
As discussed, take your entire course of antibiotics even if you feel better. Follow up with your primary care provider or wellness center in one week. Return sooner if you experience severe headache, nausea, swelling around your eye or worsening pain or other new concerning symptoms in the meantime.

## 2016-12-09 NOTE — ED Triage Notes (Signed)
Pt c/o pain and swelling to left side of her face.  St's she has been dx with sinus infection and has finished antibiotics but continues to have pain

## 2016-12-09 NOTE — ED Notes (Signed)
Pt's name called throughout lobby, could not locate her.

## 2016-12-10 NOTE — ED Provider Notes (Signed)
[facility] EMERGENCY DEPARTMENT Provider Note   CSN: [id number] Arrival date & time: 12/09/16  1748     History   Chief Complaint Chief Complaint  Patient presents with  . Facial Pain    HPI Sylvia Hall is a 56 y.o. female presenting with facial pain. She reports being treated with azythromycin for bronchitis and sinusitis and completed her course a week ago. She reports some improvement initially but has been worsening over the past week. She reports swelling to the left side of her face and tenderness around her left eye and maxillary sinus bilaterally. She is experiencing a pressure headache and tingling sensation in the left side of her face. No fever, chills, nausea, vomiting.  HPI  Past Medical History:  Diagnosis Date  . Anxiety   . Asthma   . Back pain, chronic   . Depression   . Epilepsy (HCC)   . Fatigue   . Mitral valve prolapse   . Pericarditis   . PTSD (post-traumatic stress disorder)   . Stenosis of lumbosacral spine    numbness L hand and L leg  . Thrombophlebitis     Patient Active Problem List   Diagnosis Date Noted  . Left sided numbness 12/01/2013  . Acute sinusitis, unspecified 12/08/2012  . Muscle spasm of right leg 12/08/2012  . Acute upper respiratory infections of unspecified site 12/08/2012  . Major depressive disorder, recurrent episode, severe, without mention of psychotic behavior 12/07/2012  . Borderline personality disorder (HCC) 10/19/2012  . Acute posttraumatic stress disorder 08/24/2012  . Palpitations 08/16/2012  . Neuropathy 08/16/2012  . Restless leg syndrome 08/16/2012  . Routine gynecological examination 07/31/2012  . Neurogenic pain 04/12/2012  . Anxiety 11/03/2011  . Vitamin D deficiency 08/08/2011  . Chronic back pain 05/29/2011  . MVP (mitral valve prolapse) 05/29/2011  . Asthma 05/29/2011  . Tobacco user 05/29/2011  . Chronic mastitis 05/29/2011  . Allergic rhinitis 05/29/2011    Past Surgical  History:  Procedure Laterality Date  . APPENDECTOMY    . BIOPSY BREAST     Right breast x 10  . BREAST LUMPECTOMY    . BREAST LUMPECTOMY     Right breast  . CESAREAN SECTION     x 2  . NOSE SURGERY     s/p trauma  . OVARY SURGERY    . RHINOPLASTY    . UTERINE FIBROID SURGERY      OB History    Gravida Para Term Preterm AB Living   2 2 0 2   2   SAB TAB Ectopic Multiple Live Births                   Home Medications    Prior to Admission medications   Medication Sig Start Date End Date Taking? Authorizing Provider  albuterol (PROVENTIL HFA;VENTOLIN HFA) 108 (90 Base) MCG/ACT inhaler Inhale 2 puffs into the lungs every 6 (six) hours as needed for wheezing or shortness of breath. 11/05/16  Yes Riki Sheer, PA-C  metoprolol succinate (TOPROL-XL) 50 MG 24 hr tablet TAKE 1 TABLET BY MOUTH DAILY. TAKE WITH OR IMMEDIATELY FOLLOWING A MEAL. Patient taking differently: TAKE 50 mgf TABLET BY MOUTH DAILY. TAKE WITH OR IMMEDIATELY FOLLOWING A MEAL. 11/12/15  Yes Rollene Rotunda, MD  amoxicillin-clavulanate (AUGMENTIN) 875-125 MG tablet Take 1 tablet by mouth 2 (two) times daily. 12/09/16 12/23/16  Georgiana Shore, PA-C  aspirin EC 81 MG tablet Take 1 tablet (81 mg total) by mouth  daily. Patient not taking: Reported on 12/09/2016 12/04/13   Denton Brickruong, Diana M, MD  azithromycin (ZITHROMAX) 250 MG tablet Take 1 tablet (250 mg total) by mouth daily. Take first 2 tablets together, then 1 every day until finished. Patient not taking: Reported on 12/09/2016 11/05/16   Riki SheerYoung, Michelle G, PA-C  fluticasone [facility](FLONASE) 50 MCG/ACT nasal spray Place 1 spray into both nostrils daily. 12/09/16   Georgiana ShoreMitchell, Makiah Foye B, PA-C  HYDROcodone-acetaminophen (NORCO) 5-325 MG tablet Take 1 tablet by mouth every 6 (six) hours as needed for moderate pain. Patient not taking: Reported on 12/09/2016 04/29/16   Elvina SidleLauenstein, Kurt, MD  meclizine (ANTIVERT) 12.5 MG tablet Take 1 tablet (12.5 mg total) by mouth 3 (three) times  daily as needed for dizziness. Patient not taking: Reported on 12/09/2016 07/12/16   Elson AreasSofia, Leslie K, PA-C  predniSONE (STERAPRED UNI-PAK 48 TAB) 5 MG (48) TBPK tablet As directed Patient not taking: Reported on 12/09/2016 11/05/16   Riki SheerYoung, Michelle G, PA-C    Family History Family History  Problem Relation Age of Onset  . Diabetes Mother   . Mitral valve prolapse Mother   . Asthma Mother   . CAD Mother [id number]  . Anxiety disorder Mother   . Mitral valve prolapse Sister   . Asthma Sister   . Mitral valve prolapse Son   . Alcohol abuse Son   . Diabetes Maternal Uncle   . Diabetes Maternal Grandmother   . Mitral valve prolapse Sister   . CAD Son [id number]       No details available  . Alcohol abuse Father   . Alcohol abuse Brother     Social History Social History  Substance Use Topics  . Smoking status: Current Every Day Smoker    Packs/day: 0.50    Years: 12.00    Types: Cigarettes  . Smokeless tobacco: Never Used  . Alcohol use No     Allergies   Penicillins; Phenytoin sodium extended; Aspirin; Caffeine; Dilantin [phenytoin sodium extended]; Food; Ivp dye [iodinated diagnostic agents]; Levofloxacin; Sulfa drugs cross reactors; Gabapentin; Pregabalin; and Sodium nitrate   Review of Systems Review of Systems  Constitutional: Negative for chills and fever.  HENT: Positive for congestion, facial swelling, sinus pain and sinus pressure. Negative for dental problem, drooling, ear pain, sore throat, tinnitus, trouble swallowing and voice change.   Eyes: Negative for photophobia, pain, redness and visual disturbance.  Respiratory: Negative for cough, chest tightness, shortness of breath, wheezing and stridor.   Cardiovascular: Negative for chest pain and palpitations.  Gastrointestinal: Negative for abdominal pain, nausea and vomiting.  Musculoskeletal: Negative for arthralgias, back pain, myalgias, neck pain and neck stiffness.  Skin: Negative for color change and rash.  Neurological:  Positive for headaches. Negative for dizziness, tremors, seizures, syncope, facial asymmetry, speech difficulty, weakness and light-headedness.     Physical Exam Updated Vital Signs BP 110/71 (BP Location: Left Arm)   Pulse 91   Temp 98.1 F (36.7 C) (Oral)   Resp 16   Ht 5' 3.25" (1.607 m)   Wt 80.7 kg (178 lb)   SpO2 98%   BMI 31.28 kg/m   Physical Exam  Constitutional: She is oriented to person, place, and time. She appears well-developed and well-nourished. No distress.  Afebrile, nontoxic-appearing, sitting comfortably in bed in no acute distress.  HENT:  Head: Normocephalic and atraumatic.  Mouth/Throat: Oropharynx is clear and moist. No oropharyngeal exudate.  Tenderness to palpation of the maxillary sinus bilaterally and frontal sinus on the left  Eyes: Pupils are equal, round, and reactive to light. Conjunctivae and EOM are normal. Right eye exhibits no discharge. Left eye exhibits no discharge.  No periorbital swelling or erythema on exam  Neck: Normal range of motion. Neck supple.  Cardiovascular: Normal rate, regular rhythm and normal heart sounds.   No murmur heard. Pulmonary/Chest: Effort normal and breath sounds normal. No stridor. No respiratory distress. She has no wheezes. She has no rales.  Musculoskeletal: Normal range of motion. She exhibits no edema.  Lymphadenopathy:    She has no cervical adenopathy.  Neurological: She is alert and oriented to person, place, and time. No cranial nerve deficit or sensory deficit. She exhibits normal muscle tone. Coordination normal.  Skin: Skin is warm and dry. No rash noted. She is not diaphoretic. No erythema. No pallor.  Psychiatric: She has a normal mood and affect.  Nursing note and vitals reviewed.    ED Treatments / Results  Labs (all labs ordered are listed, but only abnormal results are displayed) Labs Reviewed - No data to display  EKG  EKG Interpretation None       Radiology No results  found.  Procedures Procedures (including critical care time)  Medications Ordered in ED Medications - No data to display   Initial Impression / Assessment and Plan / ED Course  I have reviewed the triage vital signs and the nursing notes.  Pertinent labs & imaging results that were available during my care of the patient were reviewed by me and considered in my medical decision making (see chart for details).     Patient with chronic sinusitis.patient is afebrile, nontoxic-appearing. Tender to palpation maxillary and frontal sinus, exam otherwise unremarkable. No facial swelling or periorbital edema.  Will discharge home with Augmentin and close follow-up with PCP.  Advised to return if worsening or new concerning symptoms. Patient understood and agreed with plan.  Final Clinical Impressions(s) / ED Diagnoses   Final diagnoses:  Acute recurrent maxillary sinusitis    New Prescriptions Discharge Medication List as of 12/09/2016  8:21 PM       Georgiana Shore, PA-C 12/10/16 0200    Derwood Kaplan, MD 12/11/16 [phone]

## 2017-01-12 ENCOUNTER — Ambulatory Visit: Payer: Self-pay | Attending: Internal Medicine | Admitting: Physician Assistant

## 2017-01-12 VITALS — BP 119/76 | HR 76 | Temp 98.1°F | Resp 18 | Ht 63.0 in | Wt 196.9 lb

## 2017-01-12 DIAGNOSIS — Z888 Allergy status to other drugs, medicaments and biological substances status: Secondary | ICD-10-CM | POA: Insufficient documentation

## 2017-01-12 DIAGNOSIS — Z8672 Personal history of thrombophlebitis: Secondary | ICD-10-CM | POA: Insufficient documentation

## 2017-01-12 DIAGNOSIS — Z881 Allergy status to other antibiotic agents status: Secondary | ICD-10-CM | POA: Insufficient documentation

## 2017-01-12 DIAGNOSIS — Z79899 Other long term (current) drug therapy: Secondary | ICD-10-CM | POA: Insufficient documentation

## 2017-01-12 DIAGNOSIS — G8929 Other chronic pain: Secondary | ICD-10-CM | POA: Insufficient documentation

## 2017-01-12 DIAGNOSIS — S025XXA Fracture of tooth (traumatic), initial encounter for closed fracture: Secondary | ICD-10-CM | POA: Insufficient documentation

## 2017-01-12 DIAGNOSIS — Z886 Allergy status to analgesic agent status: Secondary | ICD-10-CM | POA: Insufficient documentation

## 2017-01-12 DIAGNOSIS — Z880 Allergy status to penicillin: Secondary | ICD-10-CM | POA: Insufficient documentation

## 2017-01-12 DIAGNOSIS — M549 Dorsalgia, unspecified: Secondary | ICD-10-CM | POA: Insufficient documentation

## 2017-01-12 DIAGNOSIS — Z882 Allergy status to sulfonamides status: Secondary | ICD-10-CM | POA: Insufficient documentation

## 2017-01-12 DIAGNOSIS — Z91041 Radiographic dye allergy status: Secondary | ICD-10-CM | POA: Insufficient documentation

## 2017-01-12 DIAGNOSIS — Z7951 Long term (current) use of inhaled steroids: Secondary | ICD-10-CM | POA: Insufficient documentation

## 2017-01-12 DIAGNOSIS — F4310 Post-traumatic stress disorder, unspecified: Secondary | ICD-10-CM | POA: Insufficient documentation

## 2017-01-12 DIAGNOSIS — J45909 Unspecified asthma, uncomplicated: Secondary | ICD-10-CM | POA: Insufficient documentation

## 2017-01-12 DIAGNOSIS — X58XXXA Exposure to other specified factors, initial encounter: Secondary | ICD-10-CM | POA: Insufficient documentation

## 2017-01-12 DIAGNOSIS — M4807 Spinal stenosis, lumbosacral region: Secondary | ICD-10-CM | POA: Insufficient documentation

## 2017-01-12 MED ORDER — NAPROXEN 500 MG PO TABS: 500.0000 mg | ORAL_TABLET | Freq: Two times a day (BID) | ORAL | 0 refills | Status: DC

## 2017-01-12 MED ORDER — AMOXICILLIN 500 MG PO CAPS: 500.0000 mg | ORAL_CAPSULE | Freq: Three times a day (TID) | ORAL | 0 refills | Status: DC

## 2017-01-12 MED FILL — AMOXICILLIN 500 MG CAPSULE: 500 | 10 days supply | Qty: 30 | Fill #0

## 2017-01-12 MED FILL — NAPROXEN 500 MG TABLET: 500 | 30 days supply | Qty: 60 | Fill #0

## 2017-01-12 NOTE — Progress Notes (Signed)
Patient ID: Sylvia Hall, female   DOB: 12/27/1960, 56 y.o.   MRN: [id number]   Sylvia Hall, is a 56 y.o. female  [id number]  [id number]  DOB - 05/07/1960  Subjective:  Chief Complaint and HPI: Sylvia Hall is a 56 y.o. female here today to establish care and for a follow up visit after being seen by an emergency dentist about 2 weeks ago.  Has been on 7 days of amoxicillin.  They told her that she likely needs a root canal and crown.  +painful.  No f/c.  She is currently living with her friend Sharl MaMarty and needs to apply for orange card.  She doesn't want to run out of antibiotics.  They also prescribed her prednisone but isn't taking it because it gives her palpitations.     ROS:   Constitutional:  No f/c, No night sweats, No unexplained weight loss. EENT:  No vision changes, No blurry vision, No hearing changes. No other mouth, throat, or ear problems.  Respiratory: No cough, No SOB Cardiac: No CP, no palpitations GI:  No abd pain, No N/V/D. GU: No Urinary s/sx Musculoskeletal: No joint pain Neuro: No headache, no dizziness, no motor weakness.  Skin: No rash Endocrine:  No polydipsia. No polyuria.  Psych: Denies SI/HI  No problems updated.  ALLERGIES: Allergies  Allergen Reactions  . Phenytoin Sodium Extended Other (See Comments)    seizures  . Caffeine Other (See Comments)    Heart races  . Dilantin [Phenytoin Sodium Extended] Other (See Comments)    Seizure   . Food Other (See Comments)    Sodium nitrates-headaches   . Ivp Dye [Iodinated Diagnostic Agents] Hives  . Levofloxacin Hives  . Sulfa Drugs Cross Reactors Hives  . Aspirin Other (See Comments)    Upset stomach, resolves with GERD meds   . Gabapentin Rash  . Penicillins Rash    Pt called in stating she  Was prescribed Pen VK in ED and developed a generalized raised rash.  She has taken amoxicillin and augmentin since this time and not had any problem  . Pregabalin Rash  . Sodium Nitrate Other (See  Comments)    headaches    PAST MEDICAL HISTORY: Past Medical History:  Diagnosis Date  . Anxiety   . Asthma   . Back pain, chronic   . Depression   . Epilepsy (HCC)   . Fatigue   . Mitral valve prolapse   . Pericarditis   . PTSD (post-traumatic stress disorder)   . Stenosis of lumbosacral spine    numbness L hand and L leg  . Thrombophlebitis     MEDICATIONS AT HOME: Prior to Admission medications   Medication Sig Start Date End Date Taking? Authorizing Provider  albuterol (PROVENTIL HFA;VENTOLIN HFA) 108 (90 Base) MCG/ACT inhaler Inhale 2 puffs into the lungs every 6 (six) hours as needed for wheezing or shortness of breath. 11/05/16   Riki SheerYoung, Michelle G, PA-C  amoxicillin (AMOXIL) 500 MG capsule Take 1 capsule (500 mg total) by mouth 3 (three) times daily. 01/12/17   Anders SimmondsMcClung, Angela M, PA-C  fluticasone (FLONASE) 50 MCG/ACT nasal spray Place 1 spray into both nostrils daily. 12/09/16   Mathews RobinsonsMitchell, Jessica B, PA-C  metoprolol succinate (TOPROL-XL) 50 MG 24 hr tablet TAKE 1 TABLET BY MOUTH DAILY. TAKE WITH OR IMMEDIATELY FOLLOWING A MEAL. Patient taking differently: TAKE 50 mgf TABLET BY MOUTH DAILY. TAKE WITH OR IMMEDIATELY FOLLOWING A MEAL. 11/12/15   Rollene RotundaHochrein, James, MD  naproxen (NAPROSYN) 500 MG tablet  Take 1 tablet (500 mg total) by mouth 2 (two) times daily with a meal. Prn pain and inflammation 01/12/17   Anders SimmondsMcClung, Angela M, PA-C     Objective:  EXAM:   Vitals:   01/12/17 1340  BP: 119/76  Pulse: 76  Resp: 18  Temp: 98.1 F (36.7 C)  TempSrc: Oral  SpO2: 97%  Weight: 196 lb 14.4 oz (89.3 kg)  Height: 5\' 3"  (1.6 m)    General appearance : A&OX3. NAD. Non-toxic-appearing HEENT: Atraumatic and Normocephalic.  PERRLA. EOM intact.  TM clear B. Mouth-MMM, post pharynx WNL w/o erythema, No PND.  L front incisor with filling missing from posterior of tooth.  No obvious abscess.  No facial swelling.   Neck: supple, no JVD. No cervical lymphadenopathy. No  thyromegaly Chest/Lungs:  Breathing-non-labored, Good air entry bilaterally, breath sounds normal without rales, rhonchi, or wheezing  CVS: S1 S2 regular, no murmurs, gallops, rubs  Extremities: Bilateral Lower Ext shows no edema, both legs are warm to touch with = pulse throughout Neurology:  CN II-XII grossly intact, Non focal.   Psych:  TP linear. J/I WNL. Normal speech. Appropriate eye contact and affect.  Skin:  No Rash  Data Review Lab Results  Component Value Date   HGBA1C 5.1 12/01/2013   HGBA1C 5.2 04/12/2012     Assessment & Plan   1. Closed fracture of tooth, initial encounter - Ambulatory referral to Dentistry -amoxicillin and naprosyn(she has taken both without any problem) -has orange card appt next week.    Patient have been counseled extensively about nutrition and exercise  Return in about 6 weeks (around 02/23/2017) for assign new PCP.  The patient was given clear instructions to go to ER or return to medical center if symptoms don't improve, worsen or new problems develop. The patient verbalized understanding. The patient was told to call to get lab results if they haven't heard anything in the next week.     Georgian CoAngela McClung, PA-C [facility] [place], [place] [phone]   01/12/2017, 1:56 PM

## 2017-01-19 ENCOUNTER — Ambulatory Visit: Payer: Self-pay

## 2017-02-06 ENCOUNTER — Other Ambulatory Visit: Payer: Self-pay | Admitting: Cardiology

## 2017-02-16 ENCOUNTER — Encounter: Payer: Self-pay | Admitting: Family Medicine

## 2017-02-16 ENCOUNTER — Ambulatory Visit: Payer: Self-pay | Attending: Family Medicine | Admitting: Family Medicine

## 2017-02-16 VITALS — BP 132/82 | HR 77 | Temp 98.1°F | Ht 63.0 in | Wt 198.6 lb

## 2017-02-16 DIAGNOSIS — Z880 Allergy status to penicillin: Secondary | ICD-10-CM | POA: Insufficient documentation

## 2017-02-16 DIAGNOSIS — M4802 Spinal stenosis, cervical region: Secondary | ICD-10-CM | POA: Insufficient documentation

## 2017-02-16 DIAGNOSIS — J0110 Acute frontal sinusitis, unspecified: Secondary | ICD-10-CM | POA: Insufficient documentation

## 2017-02-16 DIAGNOSIS — G2581 Restless legs syndrome: Secondary | ICD-10-CM | POA: Insufficient documentation

## 2017-02-16 DIAGNOSIS — F4310 Post-traumatic stress disorder, unspecified: Secondary | ICD-10-CM | POA: Insufficient documentation

## 2017-02-16 DIAGNOSIS — G40909 Epilepsy, unspecified, not intractable, without status epilepticus: Secondary | ICD-10-CM | POA: Insufficient documentation

## 2017-02-16 DIAGNOSIS — F329 Major depressive disorder, single episode, unspecified: Secondary | ICD-10-CM | POA: Insufficient documentation

## 2017-02-16 DIAGNOSIS — J45909 Unspecified asthma, uncomplicated: Secondary | ICD-10-CM | POA: Insufficient documentation

## 2017-02-16 DIAGNOSIS — G629 Polyneuropathy, unspecified: Secondary | ICD-10-CM | POA: Insufficient documentation

## 2017-02-16 DIAGNOSIS — I341 Nonrheumatic mitral (valve) prolapse: Secondary | ICD-10-CM | POA: Insufficient documentation

## 2017-02-16 MED ORDER — GABAPENTIN 300 MG PO CAPS: 300.0000 mg | ORAL_CAPSULE | Freq: Two times a day (BID) | ORAL | 3 refills | Status: DC

## 2017-02-16 MED ORDER — ROPINIROLE HCL ER 2 MG PO TB24: 2.0000 mg | ORAL_TABLET | Freq: Every day | ORAL | 2 refills | Status: DC

## 2017-02-16 MED ORDER — DOXYCYCLINE HYCLATE 100 MG PO TABS: 100.0000 mg | ORAL_TABLET | Freq: Two times a day (BID) | ORAL | 0 refills | Status: DC

## 2017-02-16 MED FILL — rOPINIRole HCL ER 2 MG TB24: 2 | 30 days supply | Qty: 30 | Fill #0

## 2017-02-16 MED FILL — DOXYCYCLINE HYCLATE 100 MG: 100 | 10 days supply | Qty: 20 | Fill #0

## 2017-02-16 MED FILL — GABAPENTIN 300 MG CAPSULE: 300 | 30 days supply | Qty: 60 | Fill #0

## 2017-02-16 MED FILL — METOPROLOL SUCC ER 50 MG TA: 50 | 60 days supply | Qty: 60 | Fill #0

## 2017-02-16 NOTE — Patient Instructions (Signed)

## 2017-02-16 NOTE — Progress Notes (Signed)
Subjective:  Patient ID: Sylvia DesanctisBarbara Hall, female    DOB: 07/04/1960  Age: 57 y.o. MRN: [id number]  CC: Establish Care and Cough   HPI Sylvia Hall is a 57 year old female who presents today to establish care with me.  Medical history significant for mitral valve prolapse.  She also informs me she does have a history of cervical spine stenosis with associated neuropathy and radiation of numbness from her neck down both upper extremities and she also has a history of restless legs. Since [place] ago she never resumed treatment for neuropathy and restless legs and would like to be restarted on medications as her sleep quality has been poor due to her restless leg syndrome.  Today she complains of fevers of 102, sinus congestion and tenderness, cough productive of whitish sputum, sore throat and postnasal drip for the last couple of days which have not responded to the use of Tylenol. She has also had some malaise.  Mitral valve prolapse is managed by cardiology and she is currently on metoprolol and chart notes indicate she needs to call Dr. Jenene SlickerHochrein's office to schedule an appointment. She denies chest pain, shortness of breath.  Past Medical History:  Diagnosis Date  . Anxiety   . Asthma   . Back pain, chronic   . Depression   . Epilepsy (HCC)   . Fatigue   . Mitral valve prolapse   . Pericarditis   . PTSD (post-traumatic stress disorder)   . Stenosis of lumbosacral spine    numbness L hand and L leg  . Thrombophlebitis     Past Surgical History:  Procedure Laterality Date  . APPENDECTOMY    . BIOPSY BREAST     Right breast x 10  . BREAST LUMPECTOMY    . BREAST LUMPECTOMY     Right breast  . CESAREAN SECTION     x 2  . NOSE SURGERY     s/p trauma  . OVARY SURGERY    . RHINOPLASTY    . UTERINE FIBROID SURGERY      Allergies  Allergen Reactions  . Phenytoin Sodium Extended Other (See Comments)    seizures  . Caffeine Other (See  Comments)    Heart races  . Dilantin [Phenytoin Sodium Extended] Other (See Comments)    Seizure   . Food Other (See Comments)    Sodium nitrates-headaches   . Ivp Dye [Iodinated Diagnostic Agents] Hives  . Levofloxacin Hives  . Sulfa Drugs Cross Reactors Hives  . Aspirin Other (See Comments)    Upset stomach, resolves with GERD meds   . Penicillins Rash    Pt called in stating she  Was prescribed Pen VK in ED and developed a generalized raised rash.  She has taken amoxicillin and augmentin since this time and not had any problem  . Pregabalin Rash  . Sodium Nitrate Other (See Comments)    headaches     Outpatient Medications Prior to Visit  Medication Sig Dispense Refill  . albuterol (PROVENTIL HFA;VENTOLIN HFA) 108 (90 Base) MCG/ACT inhaler Inhale 2 puffs into the lungs every 6 (six) hours as needed for wheezing or shortness of breath. 1 Inhaler 1  . metoprolol succinate (TOPROL-XL) 50 MG 24 hr tablet TAKE 1 TABLET BY MOUTH DAILY. TAKE WITH OR IMMEDIATELY FOLLOWING A MEAL. 60 tablet PRN  . amoxicillin (AMOXIL) 500 MG capsule Take 1 capsule (500 mg total) by mouth 3 (three) times daily. (Patient not taking: Reported on 02/16/2017)  30 capsule 0  . fluticasone (FLONASE) 50 MCG/ACT nasal spray Place 1 spray into both nostrils daily. (Patient not taking: Reported on 02/16/2017) 16 g 2  . naproxen (NAPROSYN) 500 MG tablet Take 1 tablet (500 mg total) by mouth 2 (two) times daily with a meal. Prn pain and inflammation (Patient not taking: Reported on 02/16/2017) 60 tablet 0   No facility-administered medications prior to visit.     ROS Review of Systems  Constitutional: Positive for fever. Negative for activity change, appetite change and fatigue.  HENT: Positive for congestion, sinus pressure, sinus pain and sore throat.   Eyes: Negative for visual disturbance.  Respiratory: Negative for cough, chest tightness, shortness of breath and wheezing.   Cardiovascular: Negative for chest pain  and palpitations.  Gastrointestinal: Negative for abdominal distention, abdominal pain and constipation.  Endocrine: Negative for polydipsia.  Genitourinary: Negative for dysuria and frequency.  Musculoskeletal: Negative for arthralgias and back pain.  Skin: Negative for rash.  Neurological: Positive for numbness. Negative for tremors and light-headedness.  Hematological: Does not bruise/bleed easily.  Psychiatric/Behavioral: Negative for agitation and behavioral problems.    Objective:  BP 132/82   Pulse 77   Temp 98.1 F (36.7 C) (Oral)   Ht 5\' 3"  (1.6 m)   Wt 198 lb 9.6 oz (90.1 kg)   SpO2 98%   BMI 35.18 kg/m   BP/Weight 02/16/2017 01/12/2017 12/09/2016  Systolic BP 132 119 110  Diastolic BP 82 76 71  Wt. (Lbs) 198.6 196.9 178  BMI 35.18 34.88 31.28  Some encounter information is confidential and restricted. Go to Review Flowsheets activity to see all data.       Physical Exam  Constitutional: She is oriented to person, place, and time. She appears well-developed and well-nourished.  HENT:  Frontal sinus tenderness  Cardiovascular: Normal rate, normal heart sounds and intact distal pulses.  No murmur heard. Pulmonary/Chest: Effort normal and breath sounds normal. She has no wheezes. She has no rales. She exhibits no tenderness.  Abdominal: Soft. Bowel sounds are normal. She exhibits no distension and no mass. There is no tenderness.  Musculoskeletal: Normal range of motion.  Neurological: She is alert and oriented to person, place, and time.  Skin: Skin is warm and dry.  Psychiatric: She has a normal mood and affect.     Assessment & Plan:   1. Neuropathy Neuropathy secondary to history of cervical stenosis as per patient She was previously on gabapentin which I have resumed - gabapentin (NEURONTIN) 300 MG capsule; Take 1 capsule (300 mg total) by mouth 2 (two) times daily.  Dispense: 60 capsule; Refill: 3  2. Restless leg syndrome Currently not taking any  medications but did well on Requip in the past She has no medical coverage and I have discussed with her that obtaining Requip could be a challenge. I have offered her the option of patient assistance program with the pharmacy here on site however she declines I would like to obtain her medications from [facility] outpatient pharmacy - rOPINIRole (REQUIP XL) 2 MG 24 hr tablet; Take 1 tablet (2 mg total) by mouth at bedtime.  Dispense: 30 tablet; Refill: 2  3. Acute non-recurrent frontal sinusitis Penicillin allergic so we will place on doxycycline - doxycycline (VIBRA-TABS) 100 MG tablet; Take 1 tablet (100 mg total) by mouth 2 (two) times daily.  Dispense: 20 tablet; Refill: 0  4. MVP (mitral valve prolapse) Currently on metoprolol Followed by cardiology-Dr. Antoine Poche and advised to schedule appointment  Meds ordered this encounter  Medications  . doxycycline (VIBRA-TABS) 100 MG tablet    Sig: Take 1 tablet (100 mg total) by mouth 2 (two) times daily.    Dispense:  20 tablet    Refill:  0  . gabapentin (NEURONTIN) 300 MG capsule    Sig: Take 1 capsule (300 mg total) by mouth 2 (two) times daily.    Dispense:  60 capsule    Refill:  3  . rOPINIRole (REQUIP XL) 2 MG 24 hr tablet    Sig: Take 1 tablet (2 mg total) by mouth at bedtime.    Dispense:  30 tablet    Refill:  2    Follow-up: Return in about 3 months (around 05/17/2017) for follow up on chronic medical conditions.   Jaclyn Shaggy MD

## 2017-02-17 ENCOUNTER — Encounter: Payer: Self-pay | Admitting: Family Medicine

## 2017-03-18 MED FILL — rOPINIRole HCL ER 2 MG TB24: 2 | 30 days supply | Qty: 30 | Fill #1

## 2017-03-21 MED FILL — GABAPENTIN 300 MG CAPSULE: 300 | 30 days supply | Qty: 60 | Fill #1 | Status: TO

## 2017-04-02 NOTE — Progress Notes (Signed)
Cardiology Office Note   Date:  04/04/2017   ID:  Sylvia Hall, DOB 1960/07/19, MRN [id number]  PCP:  Sylvia Register, MD  Cardiologist:   No primary care provider on file.   Chief Complaint  Patient presents with  . Palpitations      History of Present Illness: Sylvia Hall is a 57 y.o. female who presents for follow up of mitral valve prolapse and palpitations.  I last saw him in 2016.  She had a history of MVP but I did not see this on echo in 2014.   Since I last saw her she has done well.  She does still occasionally get palpitations.  This seems to happen about 1-2 times per week.  It might last for up to 15 minutes.  It is similar to her previous.  She has not had any syncope with it.  She does sometimes have a pulsation in her neck and head when she is trying to sleep the keeps her awake.  It might be some skipping beats.  She is not describing any new shortness of breath, PND or orthopnea.  She walks the dog and helps her husband who had a recent amputation.  She is lost about 30 pounds on a ketogenic diet.   Past Medical History:  Diagnosis Date  . Anxiety   . Asthma   . Back pain, chronic   . Depression   . Epilepsy (HCC)   . Fatigue   . Mitral valve prolapse   . Pericarditis   . PTSD (post-traumatic stress disorder)   . Stenosis of lumbosacral spine    numbness L hand and L leg  . Thrombophlebitis    Age 73    Past Surgical History:  Procedure Laterality Date  . APPENDECTOMY    . BIOPSY BREAST     Right breast x 10  . BREAST LUMPECTOMY     Right breast  . CESAREAN SECTION     x 2  . NOSE SURGERY     s/p trauma  . OVARY SURGERY    . RHINOPLASTY    . UTERINE FIBROID SURGERY       Current Outpatient Medications  Medication Sig Dispense Refill  . albuterol (PROVENTIL HFA;VENTOLIN HFA) 108 (90 Base) MCG/ACT inhaler Inhale 2 puffs into the lungs every 6 (six) hours as needed for wheezing or shortness of breath. 1 Inhaler 1  . gabapentin (NEURONTIN)  300 MG capsule Take 1 capsule (300 mg total) by mouth 2 (two) times daily. 60 capsule 3  . metoprolol succinate (TOPROL-XL) 50 MG 24 hr tablet Take 1 tablet (50 mg total) by mouth daily. Take with or immediately following a meal. 60 tablet 11  . rOPINIRole (REQUIP XL) 2 MG 24 hr tablet Take 1 tablet (2 mg total) by mouth at bedtime. 30 tablet 2  . metoprolol tartrate (LOPRESSOR) 25 MG tablet Take 0.5 tablets (12.5 mg total) by mouth as needed. For palpitations 30 tablet 3   No current facility-administered medications for this visit.     Allergies:   Phenytoin sodium extended; Caffeine; Dilantin [phenytoin sodium extended]; Food; Ivp dye [iodinated diagnostic agents]; Levofloxacin; Sulfa drugs cross reactors; Aspirin; Penicillins; Pregabalin; and Sodium nitrate    ROS:  Please see the history of present illness.   Otherwise, review of systems are positive for none.   All other systems are reviewed and negative.    PHYSICAL EXAM: VS:  BP 102/74   Pulse 74   Ht 5' 3.75" (  1.619 m)   Wt 191 lb 12.8 oz (87 kg)   BMI 33.18 kg/m  , BMI Body mass index is 33.18 kg/m.  GENERAL:  Well appearing NECK:  No jugular venous distention, waveform within normal limits, carotid upstroke brisk and symmetric, no bruits, no thyromegaly LUNGS:  Clear to auscultation bilaterally CHEST:  Unremarkable HEART:  PMI not displaced or sustained,S1 and S2 within normal limits, no S3, no S4, no clicks, no rubs, no murmurs ABD:  Flat, positive bowel sounds normal in frequency in pitch, no bruits, no rebound, no guarding, no midline pulsatile mass, no hepatomegaly, no splenomegaly EXT:  2 plus pulses throughout, no edema, no cyanosis no clubbing    EKG:  EKG is ordered today. The ekg ordered today demonstrates sinus rhythm, rate 74, axis within normal limits, intervals within normal limits, no acute ST-T wave changes.   Recent Labs: No results found for requested labs within last 8760 hours.    Lipid Panel      Component Value Date/Time   CHOL 225 (H) 12/01/2013 1615   TRIG 116 12/01/2013 1615   HDL 67 12/01/2013 1615   CHOLHDL 3.4 12/01/2013 1615   VLDL 23 12/01/2013 1615   LDLCALC 135 (H) 12/01/2013 1615      Wt Readings from Last 3 Encounters:  04/04/17 191 lb 12.8 oz (87 kg)  02/16/17 198 lb 9.6 oz (90.1 kg)  01/12/17 196 lb 14.4 oz (89.3 kg)      Other studies Reviewed: Additional studies/ records that were reviewed today include: Echo 2014. Review of the above records demonstrates:  Please see elsewhere in the note.     ASSESSMENT AND PLAN:    MVP:  There was no evidence of this on echo in 2014.  I do not hear any clinical evidence of prolapse or regurgitation and no further imaging is indicated.  PALPITATIONS: She continues to be bothered by occasional palpitations sometimes at night.  I am going to give her immediate release metoprolol to take 12.5 mg nightly.   Current medicines are reviewed at length with the patient today.  The patient does not have concerns regarding medicines.  The following changes have been made:  no change  Labs/ tests ordered today include: None  Orders Placed This Encounter  Procedures  . EKG 12-Lead     Disposition:   FU with me in 2 years.     Signed, Rollene RotundaJames Camreigh Michie, MD  04/04/2017 12:26 PM    [facility]

## 2017-04-04 ENCOUNTER — Encounter: Payer: Self-pay | Admitting: Cardiology

## 2017-04-04 ENCOUNTER — Ambulatory Visit: Payer: PRIVATE HEALTH INSURANCE | Admitting: Cardiology

## 2017-04-04 VITALS — BP 102/74 | HR 74 | Ht 63.75 in | Wt 191.8 lb

## 2017-04-04 DIAGNOSIS — I341 Nonrheumatic mitral (valve) prolapse: Secondary | ICD-10-CM

## 2017-04-04 DIAGNOSIS — R002 Palpitations: Secondary | ICD-10-CM

## 2017-04-04 MED ORDER — METOPROLOL TARTRATE 25 MG PO TABS: 12.5000 mg | ORAL_TABLET | ORAL | 6 refills | Status: DC | PRN

## 2017-04-04 MED ORDER — METOPROLOL SUCCINATE ER 50 MG PO TB24: 50.0000 mg | ORAL_TABLET | Freq: Every day | ORAL | 11 refills | Status: DC

## 2017-04-04 MED ORDER — METOPROLOL TARTRATE 25 MG PO TABS: 12.5000 mg | ORAL_TABLET | ORAL | 3 refills | Status: DC | PRN

## 2017-04-04 MED FILL — ?METOPROLOL 25 MG TABLET: 25 | 30 days supply | Qty: 15 | Fill #0

## 2017-04-04 MED FILL — METOPROLOL SUCCINATE ER 50: 50 | 30 days supply | Qty: 30 | Fill #0

## 2017-04-04 NOTE — Patient Instructions (Signed)
Medication Instructions:  START- Metoprolol Tartrate 12.5 mg as needed for palpitations  If you need a refill on your cardiac medications before your next appointment, please call your pharmacy.  Labwork: None Ordered  Testing/Procedures: None Ordred   Follow-Up: Your physician wants you to follow-up in: 2 Year. You should receive a reminder letter in the mail two months in advance. If you do not receive a letter, please call our office [phone].    Thank you for choosing [facility]!!

## 2017-04-07 ENCOUNTER — Ambulatory Visit: Payer: PRIVATE HEALTH INSURANCE | Attending: Family Medicine | Admitting: Physician Assistant

## 2017-04-07 VITALS — BP 112/73 | HR 78 | Temp 98.4°F | Ht 63.0 in | Wt 189.0 lb

## 2017-04-07 DIAGNOSIS — I341 Nonrheumatic mitral (valve) prolapse: Secondary | ICD-10-CM | POA: Insufficient documentation

## 2017-04-07 DIAGNOSIS — Z880 Allergy status to penicillin: Secondary | ICD-10-CM | POA: Insufficient documentation

## 2017-04-07 DIAGNOSIS — M549 Dorsalgia, unspecified: Secondary | ICD-10-CM | POA: Insufficient documentation

## 2017-04-07 DIAGNOSIS — Z886 Allergy status to analgesic agent status: Secondary | ICD-10-CM | POA: Insufficient documentation

## 2017-04-07 DIAGNOSIS — F4310 Post-traumatic stress disorder, unspecified: Secondary | ICD-10-CM | POA: Insufficient documentation

## 2017-04-07 DIAGNOSIS — G40909 Epilepsy, unspecified, not intractable, without status epilepticus: Secondary | ICD-10-CM | POA: Insufficient documentation

## 2017-04-07 DIAGNOSIS — Z79899 Other long term (current) drug therapy: Secondary | ICD-10-CM | POA: Insufficient documentation

## 2017-04-07 DIAGNOSIS — T63301A Toxic effect of unspecified spider venom, accidental (unintentional), initial encounter: Secondary | ICD-10-CM

## 2017-04-07 DIAGNOSIS — J45909 Unspecified asthma, uncomplicated: Secondary | ICD-10-CM | POA: Insufficient documentation

## 2017-04-07 DIAGNOSIS — Z882 Allergy status to sulfonamides status: Secondary | ICD-10-CM | POA: Insufficient documentation

## 2017-04-07 DIAGNOSIS — F329 Major depressive disorder, single episode, unspecified: Secondary | ICD-10-CM | POA: Insufficient documentation

## 2017-04-07 DIAGNOSIS — G8929 Other chronic pain: Secondary | ICD-10-CM | POA: Insufficient documentation

## 2017-04-07 MED ORDER — MUPIROCIN 2 % EX OINT: TOPICAL_OINTMENT | CUTANEOUS | 0 refills | Status: DC

## 2017-04-07 MED FILL — MUPIROCIN 2% OINTMENT: 2 | 7 days supply | Qty: 22 | Fill #0

## 2017-04-07 NOTE — Progress Notes (Signed)
Patient is here for a spider bite that has been for a week on her left leg.   Spreading, tender, and itching.

## 2017-04-07 NOTE — Patient Instructions (Signed)
Spider Bite Spider bites are not common. When spider bites do happen, most do not cause serious health problems. There are only a few types of spider bites that can cause serious health problems. What are the causes? A spider bite usually happens when a person accidentally makes contact with a spider in a way that traps the spider against the person's skin. What are the signs or symptoms? Symptoms may vary depending on the type of spider. Some spider bites may cause symptoms within 1 hour after the bite. For other spider bites, it may take 1-2 days for symptoms to develop. Common symptoms include:  Redness and swelling in the area of the bite.  Discomfort or pain in the area of the bite.  A few types of spiders, such as the black widow spider or the brown recluse spider, can inject poison (venom) into a bite wound. This venom causes more serious symptoms. Symptoms of a venomous spider bite vary, and may include:  Muscle cramps.  Nausea, vomiting, or abdominal pain.  Fever.  A skin sore (lesion) that spreads. This can break into an open wound (skin ulcer).  Light-headedness or dizziness.  How is this diagnosed? This condition may be diagnosed based on your symptoms and a physical exam. Your health care provider will ask about the history of your injury and any details you may have about the spider. This may help to determine what type of spider it was that bit you. How is this treated? Many spider bites do not require treatment. If needed, treatment may include:  Icing and keeping the bite area raised (elevated).  Over-the-counter or prescription medicines to help control symptoms.  A tetanus shot.  Antibiotic medicines.  Follow these instructions at home: Medicines  Take or apply over-the-counter and prescription medicines only as told by your health care provider.  If you were prescribed an antibiotic medicine, take or apply it as told by your health care provider. Do not  stop using the antibiotic even if your condition improves. General instructions  Do not scratch the bite area.  Keep the bite area clean and dry. Wash the bite area daily with soap and water as told by your health care provider.  If directed, apply ice to the bite area. ? Put ice in a plastic bag. ? Place a towel between your skin and the bag. ? Leave the ice on for 20 minutes, 2-3 times per day.  Elevate the affected area above the level of your heart while you are sitting or lying down, if possible.  Keep all follow-up visits as told by your health care provider. This is important. Contact a health care provider if:  Your bite does not get better after 3 days of treatment.  Your bite turns black or purple.  You have increased redness, swelling, or pain at the site of the bite. Get help right away if:  You develop shortness of breath or chest pain.  You have fluid, blood, or pus coming from the bite area.  You have muscle cramps or painful muscle spasms.  You develop abdominal pain, nausea, or vomiting.  You feel unusually tired (fatigued) or sleepy. This information is not intended to replace advice given to you by your health care provider. Make sure you discuss any questions you have with your health care provider. Document Released: 03/04/2004 Document Revised: 09/22/2015 Document Reviewed: 06/12/2014 Elsevier Interactive Patient Education  2018 [employer]vier Inc.  

## 2017-04-07 NOTE — Progress Notes (Signed)
Patient ID: Sylvia DesanctisBarbara Gingerich, female   DOB: 02/05/1961, 57 y.o.   MRN: [id number]   Sylvia Hall, is a 57 y.o. female  [id number]  [id number]  DOB - 08/22/1960  Subjective:  Chief Complaint and HPI: Sylvia Hall is a 57 y.o. female here today with possible spider bite to L lower leg.  Occurred about 1 week ago.  No f/c or systemic signs of illness.  Initially the area looked like a blister.  Now like a scab with some redness around it.  It itches and burns at times.  She didn't see a spider bite her.  She assumes it was a spider because she often sees brown and black spiders in her room and it occurred at night.     ROS:   Constitutional:  No f/c, No night sweats, No unexplained weight loss. EENT:  No vision changes, No blurry vision, No hearing changes. No mouth, throat, or ear problems.  Respiratory: No cough, No SOB Cardiac: No CP, no palpitations GI:  No abd pain, No N/V/D. GU: No Urinary s/sx Musculoskeletal: No joint pain Neuro: No headache, no dizziness, no motor weakness.  Skin: No rash Endocrine:  No polydipsia. No polyuria.  Psych: Denies SI/HI  No problems updated.  ALLERGIES: Allergies  Allergen Reactions  . Phenytoin Sodium Extended Other (See Comments)    seizures  . Caffeine Other (See Comments)    Heart races  . Dilantin [Phenytoin Sodium Extended] Other (See Comments)    Seizure   . Food Other (See Comments)    Sodium nitrates-headaches   . Ivp Dye [Iodinated Diagnostic Agents] Hives  . Levofloxacin Hives  . Sulfa Drugs Cross Reactors Hives  . Aspirin Other (See Comments)    Upset stomach, resolves with GERD meds   . Penicillins Rash    Pt called in stating she  Was prescribed Pen VK in ED and developed a generalized raised rash.  She has taken amoxicillin and augmentin since this time and not had any problem  . Pregabalin Rash  . Sodium Nitrate Other (See Comments)    headaches    PAST MEDICAL HISTORY: Past Medical History:  Diagnosis Date    . Anxiety   . Asthma   . Back pain, chronic   . Depression   . Epilepsy (HCC)   . Fatigue   . Mitral valve prolapse   . Pericarditis   . PTSD (post-traumatic stress disorder)   . Stenosis of lumbosacral spine    numbness L hand and L leg  . Thrombophlebitis    Age 89    MEDICATIONS AT HOME: Prior to Admission medications   Medication Sig Start Date End Date Taking? Authorizing Provider  albuterol (PROVENTIL HFA;VENTOLIN HFA) 108 (90 Base) MCG/ACT inhaler Inhale 2 puffs into the lungs every 6 (six) hours as needed for wheezing or shortness of breath. 11/05/16  Yes Riki SheerYoung, Michelle G, PA-C  gabapentin (NEURONTIN) 300 MG capsule Take 1 capsule (300 mg total) by mouth 2 (two) times daily. 02/16/17  Yes Hoy RegisterNewlin, Enobong, MD  metoprolol succinate (TOPROL-XL) 50 MG 24 hr tablet Take 1 tablet (50 mg total) by mouth daily. Take with or immediately following a meal. 04/04/17  Yes Hochrein, Fayrene FearingJames, MD  metoprolol tartrate (LOPRESSOR) 25 MG tablet Take 0.5 tablets (12.5 mg total) by mouth as needed. For palpitations 04/04/17 07/03/17 Yes Rollene RotundaHochrein, James, MD  rOPINIRole (REQUIP XL) 2 MG 24 hr tablet Take 1 tablet (2 mg total) by mouth at bedtime. 02/16/17  Yes Hoy RegisterNewlin, Enobong, MD  mupirocin ointment (BACTROBAN) 2 % Apply to AA bid X 7-10 days 04/07/17   Anders Simmonds, PA-C     Objective:  EXAM:   Vitals:   04/07/17 1450  BP: 112/73  Pulse: 78  Temp: 98.4 F (36.9 C)  TempSrc: Oral  SpO2: 95%  Weight: 189 lb (85.7 kg)  Height: 5\' 3"  (1.6 m)    General appearance : A&OX3. NAD. Non-toxic-appearing HEENT: Atraumatic and Normocephalic.  PERRLA. EOM intact.  Neck: supple, no JVD. No cervical lymphadenopathy. No thyromegaly Chest/Lungs:  Breathing-non-labored, Good air entry bilaterally, breath sounds normal without rales, rhonchi, or wheezing  CVS: S1 S2 regular, no murmurs, gallops, rubs  Extremities: Bilateral Lower Ext shows no edema, both legs are warm to touch with = pulse throughout.  L  lower leg medial aspect about 7 cm about medical malleolus there is a 5mm scab with mild surrounding erythema and warmth about 2cm.  No fluctuance or induration. No definite secondary infection.  No necrosis.   Neurology:  CN II-XII grossly intact, Non focal.   Psych:  TP linear. J/I WNL. Normal speech. Appropriate eye contact and affect.  Skin:  No Rash  Data Review Lab Results  Component Value Date   HGBA1C 5.1 12/01/2013   HGBA1C 5.2 04/12/2012     Assessment & Plan   1. Spider bite wound, accidental or unintentional, initial encounter No danger signs - mupirocin ointment (BACTROBAN) 2 %; Apply to AA bid X 7-10 days  Dispense: 22 g; Refill: 0   Patient have been counseled extensively about nutrition and exercise  Return for keep 05/18/2017 appt with Dr Alvis Lemmings.  The patient was given clear instructions to go to ER or return to medical center if symptoms don't improve, worsen or new problems develop. The patient verbalized understanding. The patient was told to call to get lab results if they haven't heard anything in the next week.     Georgian Co, PA-C [facility] [place], [place] [phone]   04/07/2017, 3:06 PM

## 2017-04-18 MED FILL — rOPINIRole HCL ER 2 MG TB24: 2 | 30 days supply | Qty: 30 | Fill #2

## 2017-04-21 ENCOUNTER — Ambulatory Visit: Payer: Self-pay | Attending: Family Medicine | Admitting: Physician Assistant

## 2017-04-21 VITALS — BP 99/68 | HR 81 | Temp 98.2°F | Resp 18 | Ht 63.0 in | Wt 187.0 lb

## 2017-04-21 DIAGNOSIS — G2581 Restless legs syndrome: Secondary | ICD-10-CM | POA: Insufficient documentation

## 2017-04-21 DIAGNOSIS — R062 Wheezing: Secondary | ICD-10-CM | POA: Insufficient documentation

## 2017-04-21 DIAGNOSIS — J069 Acute upper respiratory infection, unspecified: Secondary | ICD-10-CM | POA: Insufficient documentation

## 2017-04-21 DIAGNOSIS — T63304D Toxic effect of unspecified spider venom, undetermined, subsequent encounter: Secondary | ICD-10-CM

## 2017-04-21 MED ORDER — ALBUTEROL SULFATE HFA 108 (90 BASE) MCG/ACT IN AERS: 2.0000 | INHALATION_SPRAY | Freq: Four times a day (QID) | RESPIRATORY_TRACT | 1 refills | Status: DC | PRN

## 2017-04-21 MED ORDER — DOXYCYCLINE HYCLATE 100 MG PO TABS: 100.0000 mg | ORAL_TABLET | Freq: Two times a day (BID) | ORAL | 0 refills | Status: DC

## 2017-04-21 MED ORDER — FLUCONAZOLE 150 MG PO TABS: 150.0000 mg | ORAL_TABLET | Freq: Once | ORAL | 0 refills | Status: AC

## 2017-04-21 MED ORDER — ROPINIROLE HCL ER 2 MG PO TB24: 2.0000 mg | ORAL_TABLET | Freq: Every day | ORAL | 2 refills | Status: DC

## 2017-04-21 MED ORDER — FLUCONAZOLE 150 MG PO TABS: 150.0000 mg | ORAL_TABLET | Freq: Once | ORAL | 0 refills | Status: DC

## 2017-04-21 MED FILL — GABAPENTIN 300 MG CAPSULE: 300 | 30 days supply | Qty: 60 | Fill #0

## 2017-04-21 MED FILL — ALBUTEROL SULFATE HFA 108 (: 108 (90 BAS | 25 days supply | Qty: 18 | Fill #0

## 2017-04-21 MED FILL — ?DOXYCYCLINE 100MG TABLET: 100 | 10 days supply | Qty: 20 | Fill #0

## 2017-04-21 MED FILL — FLUCONAZOLE 150 MG TABLET: 150 | 1 days supply | Qty: 1 | Fill #0

## 2017-04-21 NOTE — Progress Notes (Signed)
Patient ID: Sylvia Hall, female   DOB: November 09, 1960, 57 y.o.   MRN: [id number]   Sylvia Hall, is a 57 y.o. female  [id number]  [id number]  DOB - 1961/02/03  Subjective:  Chief Complaint and HPI: Sylvia Hall is a 57 y.o. female here today to for multiple issues.  Needs RF on RLS meds and inhaler.  Also having URI symptoms with cough, nasal congestion, sinus pain and pressure.  Blowing yellow mucus out of her nose and coughing up some yellow mucus for about 1 week. +mild ST.  No f/c.  Has been using ointment on L lower leg.  seesm to be improving some but still tender.  No necrosis.    ROS:   Constitutional:  No f/c, No night sweats, No unexplained weight loss. EENT:  No vision changes, No blurry vision, No hearing changes. No other mouth, throat, or ear problems.  Respiratory: +cough, No SOB Cardiac: No CP, no palpitations GI:  No abd pain, No N/V/D. GU: No Urinary s/sx Musculoskeletal: No joint pain Neuro: No headache, no dizziness, no motor weakness.  Skin: No rash Endocrine:  No polydipsia. No polyuria.  Psych: Denies SI/HI  No problems updated.  ALLERGIES: Allergies  Allergen Reactions  . Phenytoin Sodium Extended Other (See Comments)    seizures  . Caffeine Other (See Comments)    Heart races  . Dilantin [Phenytoin Sodium Extended] Other (See Comments)    Seizure   . Food Other (See Comments)    Sodium nitrates-headaches   . Ivp Dye [Iodinated Diagnostic Agents] Hives  . Levofloxacin Hives  . Sulfa Drugs Cross Reactors Hives  . Aspirin Other (See Comments)    Upset stomach, resolves with GERD meds   . Penicillins Rash    Pt called in stating she  Was prescribed Pen VK in ED and developed a generalized raised rash.  She has taken amoxicillin and augmentin since this time and not had any problem  . Pregabalin Rash  . Sodium Nitrate Other (See Comments)    headaches    PAST MEDICAL HISTORY: Past Medical History:  Diagnosis Date  . Anxiety   . Asthma     . Back pain, chronic   . Depression   . Epilepsy (HCC)   . Fatigue   . Mitral valve prolapse   . Pericarditis   . PTSD (post-traumatic stress disorder)   . Stenosis of lumbosacral spine    numbness L hand and L leg  . Thrombophlebitis    Age 59    MEDICATIONS AT HOME: Prior to Admission medications   Medication Sig Start Date End Date Taking? Authorizing Provider  albuterol (PROVENTIL HFA;VENTOLIN HFA) 108 (90 Base) MCG/ACT inhaler Inhale 2 puffs into the lungs every 6 (six) hours as needed for wheezing or shortness of breath. 04/21/17   Anders Simmonds, PA-C  doxycycline (VIBRA-TABS) 100 MG tablet Take 1 tablet (100 mg total) by mouth 2 (two) times daily. 04/21/17   Anders Simmonds, PA-C  fluconazole (DIFLUCAN) 150 MG tablet Take 1 tablet (150 mg total) by mouth once for 1 dose. 04/21/17 04/21/17  Anders Simmonds, PA-C  gabapentin (NEURONTIN) 300 MG capsule Take 1 capsule (300 mg total) by mouth 2 (two) times daily. 02/16/17   Hoy Register, MD  metoprolol succinate (TOPROL-XL) 50 MG 24 hr tablet Take 1 tablet (50 mg total) by mouth daily. Take with or immediately following a meal. 04/04/17   Rollene Rotunda, MD  metoprolol tartrate (LOPRESSOR) 25 MG tablet Take 0.5 tablets (  12.5 mg total) by mouth as needed. For palpitations 04/04/17 07/03/17  Rollene RotundaHochrein, James, MD  mupirocin ointment (BACTROBAN) 2 % Apply to AA bid X 7-10 days 04/07/17   Anders Simmonds,  M, PA-C  rOPINIRole (REQUIP XL) 2 MG 24 hr tablet Take 1 tablet (2 mg total) by mouth at bedtime. 04/21/17   Anders Simmonds,  M, PA-C     Objective:  EXAM:   Vitals:   04/21/17 1421  BP: 99/68  Pulse: 81  Resp: 18  Temp: 98.2 F (36.8 C)  TempSrc: Oral  SpO2: 100%  Weight: 187 lb (84.8 kg)  Height: 5\' 3"  (1.6 m)    General appearance : A&OX3. NAD. Non-toxic-appearing HEENT: Atraumatic and Normocephalic.  PERRLA. EOM intact.  TM full B. Mouth-MMM, post pharynx with mild erythema, no exudate, + PND. Neck: supple, no JVD. No  cervical lymphadenopathy. No thyromegaly Chest/Lungs:  Breathing-non-labored, Good air entry bilaterally, breath sounds with mild wheezing, no rales, no rhonchi CVS: S1 S2 regular, no murmurs, gallops, rubs  Extremities: Bilateral Lower Ext shows no edema, both legs are warm to touch with = pulse throughout L lower leg(see last visit) appears to be improving, small scab, mild erythema/no induration, no fluctuance/no purulence.  No necrosis Neurology:  CN II-XII grossly intact, Non focal.   Psych:  TP linear. J/I WNL. Normal speech. Appropriate eye contact and affect.  Skin:  No Rash  Data Review Lab Results  Component Value Date   HGBA1C 5.1 12/01/2013   HGBA1C 5.2 04/12/2012     Assessment & Plan   1. Upper respiratory tract infection, unspecified type Will cover for atypicals and also to expedite the leg healing - doxycycline (VIBRA-TABS) 100 MG tablet; Take 1 tablet (100 mg total) by mouth 2 (two) times daily.  Dispense: 20 tablet; Refill: 0  2. Restless leg syndrome Stable-continue - rOPINIRole (REQUIP XL) 2 MG 24 hr tablet; Take 1 tablet (2 mg total) by mouth at bedtime.  Dispense: 30 tablet; Refill: 2  3. Wheezing mild - albuterol (PROVENTIL HFA;VENTOLIN HFA) 108 (90 Base) MCG/ACT inhaler; Inhale 2 puffs into the lungs every 6 (six) hours as needed for wheezing or shortness of breath.  Dispense: 1 Inhaler; Refill: 1  4. Spider bite wound, undetermined intent, subsequent encounter Much improved - doxycycline (VIBRA-TABS) 100 MG tablet; Take 1 tablet (100 mg total) by mouth 2 (two) times daily.  Dispense: 20 tablet; Refill: 0 - fluconazole (DIFLUCAN) 150 MG tablet; Take 1 tablet (150 mg total) by mouth once for 1 dose.  Dispense: 1 tablet; Refill: 0(if needed)   Patient have been counseled extensively about nutrition and exercise  Return for keep 05/18/2017 f/up appt.  The patient was given clear instructions to go to ER or return to medical center if symptoms don't  improve, worsen or new problems develop. The patient verbalized understanding. The patient was told to call to get lab results if they haven't heard anything in the next week.     Georgian Co , PA-C [facility], [place] [phone]   04/21/2017, 2:29 PM

## 2017-05-17 MED FILL — rOPINIRole HCL ER 2 MG TB24: 2 | 30 days supply | Qty: 30 | Fill #0

## 2017-05-17 MED FILL — METOPROLOL SUCCINATE ER 50: 50 | 30 days supply | Qty: 30 | Fill #1

## 2017-05-18 ENCOUNTER — Ambulatory Visit: Payer: PRIVATE HEALTH INSURANCE | Attending: Family Medicine | Admitting: Family Medicine

## 2017-05-18 ENCOUNTER — Encounter: Payer: Self-pay | Admitting: Family Medicine

## 2017-05-18 VITALS — BP 114/77 | HR 81 | Temp 98.6°F | Ht 63.0 in | Wt 183.2 lb

## 2017-05-18 DIAGNOSIS — M545 Low back pain: Secondary | ICD-10-CM | POA: Insufficient documentation

## 2017-05-18 DIAGNOSIS — R200 Anesthesia of skin: Secondary | ICD-10-CM | POA: Insufficient documentation

## 2017-05-18 DIAGNOSIS — M5136 Other intervertebral disc degeneration, lumbar region: Secondary | ICD-10-CM

## 2017-05-18 DIAGNOSIS — I341 Nonrheumatic mitral (valve) prolapse: Secondary | ICD-10-CM | POA: Insufficient documentation

## 2017-05-18 DIAGNOSIS — J45909 Unspecified asthma, uncomplicated: Secondary | ICD-10-CM | POA: Insufficient documentation

## 2017-05-18 DIAGNOSIS — G8929 Other chronic pain: Secondary | ICD-10-CM | POA: Insufficient documentation

## 2017-05-18 DIAGNOSIS — Z882 Allergy status to sulfonamides status: Secondary | ICD-10-CM | POA: Insufficient documentation

## 2017-05-18 DIAGNOSIS — G40909 Epilepsy, unspecified, not intractable, without status epilepticus: Secondary | ICD-10-CM | POA: Insufficient documentation

## 2017-05-18 DIAGNOSIS — Z79899 Other long term (current) drug therapy: Secondary | ICD-10-CM | POA: Insufficient documentation

## 2017-05-18 DIAGNOSIS — G629 Polyneuropathy, unspecified: Secondary | ICD-10-CM

## 2017-05-18 DIAGNOSIS — M5020 Other cervical disc displacement, unspecified cervical region: Secondary | ICD-10-CM

## 2017-05-18 DIAGNOSIS — M542 Cervicalgia: Secondary | ICD-10-CM | POA: Insufficient documentation

## 2017-05-18 DIAGNOSIS — Z7951 Long term (current) use of inhaled steroids: Secondary | ICD-10-CM | POA: Insufficient documentation

## 2017-05-18 DIAGNOSIS — Z880 Allergy status to penicillin: Secondary | ICD-10-CM | POA: Insufficient documentation

## 2017-05-18 DIAGNOSIS — F4310 Post-traumatic stress disorder, unspecified: Secondary | ICD-10-CM | POA: Insufficient documentation

## 2017-05-18 DIAGNOSIS — M5126 Other intervertebral disc displacement, lumbar region: Secondary | ICD-10-CM

## 2017-05-18 DIAGNOSIS — F329 Major depressive disorder, single episode, unspecified: Secondary | ICD-10-CM | POA: Insufficient documentation

## 2017-05-18 DIAGNOSIS — M5030 Other cervical disc degeneration, unspecified cervical region: Secondary | ICD-10-CM

## 2017-05-18 MED ORDER — METHOCARBAMOL 500 MG PO TABS: 1000.0000 mg | ORAL_TABLET | Freq: Two times a day (BID) | ORAL | 3 refills | Status: DC | PRN

## 2017-05-18 MED ORDER — GABAPENTIN 300 MG PO CAPS: 600.0000 mg | ORAL_CAPSULE | Freq: Two times a day (BID) | ORAL | 3 refills | Status: DC

## 2017-05-18 MED FILL — METHOCARBAMOL 500 MG TABS: 500 | 30 days supply | Qty: 120 | Fill #0

## 2017-05-18 NOTE — Progress Notes (Signed)
Subjective:  Patient ID: Sylvia Hall, female    DOB: 1960/02/18  Age: 57 y.o. MRN: [id number]  CC: Asthma   HPI Sylvia Hall is a 57 year old female with a history of Mitral valve prolapse, Asthma here for a follow up visit. She had a visit with the PA 2 months ago for a spider bite on her left leg and was treated with Doxycyline with improvement in symptom. Scab is still present. Evaluated by Cardiology, Dr Percival Spanish in 03/2017 due to her h/o MVP and as per notes, no evidence of mitral valve prolapse on echo but metoprolol dose was increased due to palpitations. Her Asthma is stable with no flares.  She continues to suffer from chronic neck and lower back pain. Neck pain radiates down both arms, is severe and prevents her from using her hands as she would like with associated numbness. Lumbar pain radiates down both legs. No recent falls. She brings in a copy of her MRI of the c-spine and L spine from an outside hospital from 12/2006  which reveals mild degenerative changes, annular bulges at L2-3,3-4,4-5 and neural foramina narrowing.C-spine reveals C3-4, 4-5 foraminal narrowing. Denies loss of sphincteric functions.  Past Medical History:  Diagnosis Date  . Anxiety   . Asthma   . Back pain, chronic   . Depression   . Epilepsy ([place])   . Fatigue   . Mitral valve prolapse   . Pericarditis   . PTSD (post-traumatic stress disorder)   . Stenosis of lumbosacral spine    numbness L hand and L leg  . Thrombophlebitis    Age 38    Past Surgical History:  Procedure Laterality Date  . APPENDECTOMY    . BIOPSY BREAST     Right breast x 10  . BREAST LUMPECTOMY     Right breast  . CESAREAN SECTION     x 2  . NOSE SURGERY     s/p trauma  . OVARY SURGERY    . RHINOPLASTY    . UTERINE FIBROID SURGERY      Allergies  Allergen Reactions  . Phenytoin Sodium Extended Other (See Comments)    seizures  . Caffeine Other (See Comments)    Heart races  . Dilantin [Phenytoin Sodium  Extended] Other (See Comments)    Seizure   . Food Other (See Comments)    Sodium nitrates-headaches   . Ivp Dye [Iodinated Diagnostic Agents] Hives  . Levofloxacin Hives  . Sulfa Drugs Cross Reactors Hives  . Aspirin Other (See Comments)    Upset stomach, resolves with GERD meds   . Penicillins Rash    Pt called in stating she  Was prescribed Pen VK in ED and developed a generalized raised rash.  She has taken amoxicillin and augmentin since this time and not had any problem  . Pregabalin Rash  . Sodium Nitrate Other (See Comments)    headaches     Outpatient Medications Prior to Visit  Medication Sig Dispense Refill  . albuterol (PROVENTIL HFA;VENTOLIN HFA) 108 (90 Base) MCG/ACT inhaler Inhale 2 puffs into the lungs every 6 (six) hours as needed for wheezing or shortness of breath. 1 Inhaler 1  . metoprolol succinate (TOPROL-XL) 50 MG 24 hr tablet Take 1 tablet (50 mg total) by mouth daily. Take with or immediately following a meal. 60 tablet 11  . metoprolol tartrate (LOPRESSOR) 25 MG tablet Take 0.5 tablets (12.5 mg total) by mouth as needed. For palpitations 30 tablet 3  .  rOPINIRole (REQUIP XL) 2 MG 24 hr tablet Take 1 tablet (2 mg total) by mouth at bedtime. 30 tablet 2  . gabapentin (NEURONTIN) 300 MG capsule Take 1 capsule (300 mg total) by mouth 2 (two) times daily. 60 capsule 3  . doxycycline (VIBRA-TABS) 100 MG tablet Take 1 tablet (100 mg total) by mouth 2 (two) times daily. (Patient not taking: Reported on 05/18/2017) 20 tablet 0  . mupirocin ointment (BACTROBAN) 2 % Apply to AA bid X 7-10 days (Patient not taking: Reported on 05/18/2017) 22 g 0   No facility-administered medications prior to visit.     ROS Review of Systems  Constitutional: Negative for activity change, appetite change and fatigue.  HENT: Negative for congestion, sinus pressure and sore throat.   Eyes: Negative for visual disturbance.  Respiratory: Negative for cough, chest tightness, shortness of  breath and wheezing.   Cardiovascular: Negative for chest pain and palpitations.  Gastrointestinal: Negative for abdominal distention, abdominal pain and constipation.  Endocrine: Negative for polydipsia.  Genitourinary: Negative for dysuria and frequency.  Musculoskeletal:       See hpi  Skin: Positive for rash.  Neurological: Negative for tremors, light-headedness and numbness.  Hematological: Does not bruise/bleed easily.  Psychiatric/Behavioral: Negative for agitation and behavioral problems.    Objective:  BP 114/77   Pulse 81   Temp 98.6 F (37 C) (Oral)   Ht '5\' 3"'  (1.6 m)   Wt 183 lb 3.2 oz (83.1 kg)   SpO2 92%   BMI 32.45 kg/m   BP/Weight 05/18/2017 04/21/2017 08/04/348  Systolic BP 093 99 818  Diastolic BP 77 68 73  Wt. (Lbs) 183.2 187 189  BMI 32.45 33.13 33.48  Some encounter information is confidential and restricted. Go to Review Flowsheets activity to see all data.      Physical Exam  Constitutional: She is oriented to person, place, and time. She appears well-developed and well-nourished.  Cardiovascular: Normal rate, normal heart sounds and intact distal pulses.  No murmur heard. Pulmonary/Chest: Effort normal and breath sounds normal. She has no wheezes. She has no rales. She exhibits no tenderness.  Abdominal: Soft. Bowel sounds are normal. She exhibits no distension and no mass. There is no tenderness.  Musculoskeletal: Normal range of motion. She exhibits tenderness (TTp of posterior neck; positive Hawkin's sign's in both upper extremities).  TTP of lumbar spine; +ve straight leg raise on the R  Neurological: She is alert and oriented to person, place, and time.  Skin:  L leg scab from spider bite.  Psychiatric: She has a normal mood and affect.     Assessment & Plan:   1. Neuropathy - gabapentin (NEURONTIN) 300 MG capsule; Take 2 capsules (600 mg total) by mouth 2 (two) times daily.  Dispense: 120 capsule; Refill: 3 - Ambulatory referral to  Physical Medicine Rehab  2. Bulging lumbar disc Uncontrolled Increased dose of Gabapentin - Ambulatory referral to Physical Medicine Rehab  3. Bulging of cervical intervertebral disc Uncontrolled - Ambulatory referral to Physical Medicine Rehab  4. MVP (mitral valve prolapse) Currently on Metoprolol Followed by Cardiology - CMP14+EGFR; Future - Lipid panel; Future   Meds ordered this encounter  Medications  . gabapentin (NEURONTIN) 300 MG capsule    Sig: Take 2 capsules (600 mg total) by mouth 2 (two) times daily.    Dispense:  120 capsule    Refill:  3    Discontinue previous dose  . methocarbamol (ROBAXIN) 500 MG tablet    Sig: Take  2 tablets (1,000 mg total) by mouth 2 (two) times daily as needed for muscle spasms.    Dispense:  120 tablet    Refill:  3    Follow-up: Return in about 3 months (around 08/17/2017) for Follow-up of chronic medical conditions.   Charlott Rakes MD

## 2017-05-18 NOTE — Patient Instructions (Signed)
Cervical Radiculopathy  Cervical radiculopathy means that a nerve in the neck is pinched or bruised. This can cause pain or loss of feeling (numbness) that runs from your neck to your arm and fingers.  Follow these instructions at home:  Managing pain  ? Take over-the-counter and prescription medicines only as told by your doctor.  ? If directed, put ice on the injured or painful area.  ? Put ice in a plastic bag.  ? Place a towel between your skin and the bag.  ? Leave the ice on for 20 minutes, 2?3 times per day.  ? If ice does not help, you can try using heat. Take a warm shower or warm bath, or use a heat pack as told by your doctor.  ? You may try a gentle neck and shoulder massage.  Activity  ? Rest as needed. Follow instructions from your doctor about any activities to avoid.  ? Do exercises as told by your doctor or physical therapist.  General instructions  ? If you were given a soft collar, wear it as told by your doctor.  ? Use a flat pillow when you sleep.  ? Keep all follow-up visits as told by your doctor. This is important.  Contact a doctor if:  ? Your condition does not improve with treatment.  Get help right away if:  ? Your pain gets worse and is not controlled with medicine.  ? You lose feeling or feel weak in your hand, arm, face, or leg.  ? You have a fever.  ? You have a stiff neck.  ? You cannot control when you poop or pee (have incontinence).  ? You have trouble with walking, balance, or talking.  This information is not intended to replace advice given to you by your health care provider. Make sure you discuss any questions you have with your health care provider.  Document Released: 01/14/2011 Document Revised: 07/03/2015 Document Reviewed: 03/21/2014  Elsevier Interactive Patient Education ? 2018 [employer]vier [employer]

## 2017-05-19 ENCOUNTER — Ambulatory Visit: Payer: PRIVATE HEALTH INSURANCE | Attending: Family Medicine

## 2017-05-19 DIAGNOSIS — I341 Nonrheumatic mitral (valve) prolapse: Secondary | ICD-10-CM

## 2017-05-19 MED FILL — GABAPENTIN 300 MG CAPSULE: 300 | 30 days supply | Qty: 120 | Fill #0

## 2017-05-19 NOTE — Progress Notes (Signed)
Patient here for lab visit  

## 2017-05-20 LAB — CMP14+EGFR
ALBUMIN: 4.2 g/dL (ref 3.5–5.5)
ALT: 15 IU/L (ref 0–32)
AST: 19 IU/L (ref 0–40)
Albumin/Globulin Ratio: 1.9 (ref 1.2–2.2)
Alkaline Phosphatase: 75 IU/L (ref 39–117)
BUN / CREAT RATIO: 20 (ref 9–23)
BUN: 14 mg/dL (ref 6–24)
Bilirubin Total: 0.3 mg/dL (ref 0.0–1.2)
CALCIUM: 9.1 mg/dL (ref 8.7–10.2)
CO2: 20 mmol/L (ref 20–29)
CREATININE: 0.69 mg/dL (ref 0.57–1.00)
Chloride: 106 mmol/L (ref 96–106)
GFR, EST AFRICAN AMERICAN: 112 mL/min/{1.73_m2} (ref >59)
GFR, EST NON AFRICAN AMERICAN: 97 mL/min/{1.73_m2} (ref >59)
Globulin, Total: 2.2 g/dL (ref 1.5–4.5)
Glucose: 92 mg/dL (ref 65–99)
Potassium: 4 mmol/L (ref 3.5–5.2)
Sodium: 142 mmol/L (ref 134–144)
Total Protein: 6.4 g/dL (ref 6.0–8.5)

## 2017-05-20 LAB — LIPID PANEL
CHOLESTEROL TOTAL: 214 mg/dL — AB (ref 100–199)
Chol/HDL Ratio: 3.5 ratio (ref 0.0–4.4)
HDL: 62 mg/dL (ref >39)
LDL Calculated: 132 mg/dL — ABNORMAL HIGH (ref 0–99)
Triglycerides: 98 mg/dL (ref 0–149)
VLDL Cholesterol Cal: 20 mg/dL (ref 5–40)

## 2017-05-20 MED ORDER — ATORVASTATIN CALCIUM 20 MG PO TABS: 20.0000 mg | ORAL_TABLET | Freq: Every day | ORAL | 3 refills | Status: DC

## 2017-06-15 ENCOUNTER — Ambulatory Visit: Payer: PRIVATE HEALTH INSURANCE | Attending: Family Medicine | Admitting: Physician Assistant

## 2017-06-15 ENCOUNTER — Other Ambulatory Visit (HOSPITAL_COMMUNITY)
Admission: RE | Admit: 2017-06-15 | Discharge: 2017-06-15 | Disposition: A | Discharge status: Home / Self Care | Payer: PRIVATE HEALTH INSURANCE | Source: Ambulatory Visit | Attending: Family Medicine | Admitting: Family Medicine

## 2017-06-15 VITALS — BP 110/76 | HR 76 | Temp 98.7°F | Resp 16 | Ht 63.0 in | Wt 182.2 lb

## 2017-06-15 DIAGNOSIS — M151 Heberden's nodes (with arthropathy): Secondary | ICD-10-CM

## 2017-06-15 DIAGNOSIS — B009 Herpesviral infection, unspecified: Secondary | ICD-10-CM | POA: Insufficient documentation

## 2017-06-15 DIAGNOSIS — T148XXA Other injury of unspecified body region, initial encounter: Secondary | ICD-10-CM | POA: Insufficient documentation

## 2017-06-15 DIAGNOSIS — G40909 Epilepsy, unspecified, not intractable, without status epilepticus: Secondary | ICD-10-CM | POA: Insufficient documentation

## 2017-06-15 DIAGNOSIS — N898 Other specified noninflammatory disorders of vagina: Secondary | ICD-10-CM | POA: Insufficient documentation

## 2017-06-15 DIAGNOSIS — F4310 Post-traumatic stress disorder, unspecified: Secondary | ICD-10-CM | POA: Insufficient documentation

## 2017-06-15 DIAGNOSIS — M549 Dorsalgia, unspecified: Secondary | ICD-10-CM | POA: Insufficient documentation

## 2017-06-15 DIAGNOSIS — Z79899 Other long term (current) drug therapy: Secondary | ICD-10-CM | POA: Insufficient documentation

## 2017-06-15 DIAGNOSIS — M25541 Pain in joints of right hand: Secondary | ICD-10-CM

## 2017-06-15 DIAGNOSIS — J45909 Unspecified asthma, uncomplicated: Secondary | ICD-10-CM | POA: Insufficient documentation

## 2017-06-15 DIAGNOSIS — G629 Polyneuropathy, unspecified: Secondary | ICD-10-CM | POA: Insufficient documentation

## 2017-06-15 DIAGNOSIS — F329 Major depressive disorder, single episode, unspecified: Secondary | ICD-10-CM | POA: Insufficient documentation

## 2017-06-15 DIAGNOSIS — I341 Nonrheumatic mitral (valve) prolapse: Secondary | ICD-10-CM | POA: Insufficient documentation

## 2017-06-15 DIAGNOSIS — M25542 Pain in joints of left hand: Secondary | ICD-10-CM

## 2017-06-15 DIAGNOSIS — G8929 Other chronic pain: Secondary | ICD-10-CM | POA: Insufficient documentation

## 2017-06-15 DIAGNOSIS — W57XXXA Bitten or stung by nonvenomous insect and other nonvenomous arthropods, initial encounter: Secondary | ICD-10-CM | POA: Insufficient documentation

## 2017-06-15 LAB — POCT URINALYSIS DIPSTICK
Bilirubin, UA: NEGATIVE
Blood, UA: NEGATIVE
GLUCOSE UA: NEGATIVE
Ketones, UA: NEGATIVE
Leukocytes, UA: NEGATIVE
Nitrite, UA: NEGATIVE
PH UA: 6.5 (ref 5.0–8.0)
Protein, UA: NEGATIVE
Spec Grav, UA: <=1.005 — AB (ref 1.010–1.025)
UROBILINOGEN UA: 0.2 U/dL

## 2017-06-15 MED ORDER — VALACYCLOVIR HCL 1 G PO TABS: 1000.0000 mg | ORAL_TABLET | Freq: Three times a day (TID) | ORAL | 0 refills | Status: DC

## 2017-06-15 MED ORDER — PREDNISONE 10 MG PO TABS: ORAL_TABLET | ORAL | 0 refills | Status: DC

## 2017-06-15 MED FILL — METHOCARBAMOL 500 MG TABS: 500 | 30 days supply | Qty: 120 | Fill #1

## 2017-06-15 MED FILL — METOPROLOL SUCCINATE ER 50: 50 | 30 days supply | Qty: 30 | Fill #2

## 2017-06-15 MED FILL — ALBUTEROL SULFATE HFA 108 (: 108 (90 BAS | 25 days supply | Qty: 18 | Fill #1

## 2017-06-15 MED FILL — ?VALACYCLOVIR HCL 1 GRAM TA: 1 | 7 days supply | Qty: 21 | Fill #0

## 2017-06-15 MED FILL — GABAPENTIN 300 MG CAPSULE: 300 | 30 days supply | Qty: 120 | Fill #1

## 2017-06-15 MED FILL — predniSONE 10 MG TABS: 10 | 6 days supply | Qty: 21 | Fill #0

## 2017-06-15 MED FILL — rOPINIRole HCL ER 2 MG TB24: 2 | 30 days supply | Qty: 30 | Fill #1

## 2017-06-15 NOTE — Progress Notes (Signed)
Patient ID: Sylvia Hall, female   DOB: 14-Jan-1961, 57 y.o.   MRN: [id number]   Sylvia Hall, is a 57 y.o. female  [id number]:[id number]  [id number]  DOB - 03/20/1960  Subjective:  Chief Complaint and HPI: Sylvia Hall is a 57 y.o. female here today with multiple issues.  Pulled 3 ticks off of herself about 2 months ago.  One was in the vaginal region. No f/c.  Some dysuria.    She has been having a lot of hand joint pain.  She has also noticed some small nodules on her hands.    Has a new vaginal lesion that is tender.  Her partner gets cold sores.    Neuropathy not better with gabapentin.    Social:  Not working; with partner X 7 years  ROS:   Constitutional:  No f/c, No night sweats, No unexplained weight loss. EENT:  No vision changes, No blurry vision, No hearing changes. No mouth, throat, or ear problems.  Respiratory: No cough, No SOB Cardiac: No CP, no palpitations GI:  No abd pain, No N/V/D. GU: No additional Urinary s/sx Musculoskeletal: joint pain as above Neuro: No headache, no dizziness, no motor weakness.  Skin: No rash Endocrine:  No polydipsia. No polyuria.  Psych: Denies SI/HI  No problems updated.  ALLERGIES: Allergies  Allergen Reactions  . Phenytoin Sodium Extended Other (See Comments)    seizures  . Caffeine Other (See Comments)    Heart races  . Dilantin [Phenytoin Sodium Extended] Other (See Comments)    Seizure   . Food Other (See Comments)    Sodium nitrates-headaches   . Ivp Dye [Iodinated Diagnostic Agents] Hives  . Levofloxacin Hives  . Sulfa Drugs Cross Reactors Hives  . Aspirin Other (See Comments)    Upset stomach, resolves with GERD meds   . Penicillins Rash    Pt called in stating she  Was prescribed Pen VK in ED and developed a generalized raised rash.  She has taken amoxicillin and augmentin since this time and not had any problem  . Pregabalin Rash  . Sodium Nitrate Other (See Comments)    headaches    PAST MEDICAL  HISTORY: Past Medical History:  Diagnosis Date  . Anxiety   . Asthma   . Back pain, chronic   . Depression   . Epilepsy ([facility])   . Fatigue   . Mitral valve prolapse   . Pericarditis   . PTSD (post-traumatic stress disorder)   . Stenosis of lumbosacral spine    numbness L hand and L leg  . Thrombophlebitis    Age 57    MEDICATIONS AT HOME: Prior to Admission medications   Medication Sig Start Date End Date Taking? Authorizing Provider  albuterol (PROVENTIL HFA;VENTOLIN HFA) 108 (90 Base) MCG/ACT inhaler Inhale 2 puffs into the lungs every 6 (six) hours as needed for wheezing or shortness of breath. 04/21/17  Yes Anders Simmonds, PA-C  gabapentin (NEURONTIN) 300 MG capsule Take 2 capsules (600 mg total) by mouth 2 (two) times daily. 05/18/17  Yes Hoy Register, MD  methocarbamol (ROBAXIN) 500 MG tablet Take 2 tablets (1,000 mg total) by mouth 2 (two) times daily as needed for muscle spasms. 05/18/17  Yes Hoy Register, MD  metoprolol succinate (TOPROL-XL) 50 MG 24 hr tablet Take 1 tablet (50 mg total) by mouth daily. Take with or immediately following a meal. 04/04/17  Yes Hochrein, Fayrene Fearing, MD  metoprolol tartrate (LOPRESSOR) 25 MG tablet Take 0.5 tablets (12.5 mg total) by  mouth as needed. For palpitations 04/04/17 07/03/17 Yes Rollene Rotunda, MD  rOPINIRole (REQUIP XL) 2 MG 24 hr tablet Take 1 tablet (2 mg total) by mouth at bedtime. 04/21/17  Yes Anders Simmonds, PA-C  doxycycline (VIBRA-TABS) 100 MG tablet Take 1 tablet (100 mg total) by mouth 2 (two) times daily. Patient not taking: Reported on 05/18/2017 04/21/17   Anders Simmonds, PA-C  mupirocin ointment (BACTROBAN) 2 % Apply to AA bid X 7-10 days Patient not taking: Reported on 05/18/2017 04/07/17   Anders Simmonds, PA-C  predniSONE (DELTASONE) 10 MG tablet 6,5,4,3,2,1 take each days dose in am with food 06/15/17   Anders Simmonds, PA-C     Objective:  EXAM:   Vitals:   06/15/17 1423  BP: 110/76  Pulse: 76  Resp: 16   Temp: 98.7 F (37.1 C)  TempSrc: Oral  Weight: 182 lb 3.2 oz (82.6 kg)  Height:  (1.6 m)    General appearance : A&OX3. NAD. Non-toxic-appearing HEENT: Atraumatic and Normocephalic.  PERRLA. EOM intact.   Neck: supple, no JVD. No cervical lymphadenopathy. No thyromegaly Chest/Lungs:  Breathing-non-labored, Good air entry bilaterally, breath sounds normal without rales, rhonchi, or wheezing  CVS: S1 S2 regular, no murmurs, gallops, rubs  Nodules present B hands/DIP that are TTP Extremities: Bilateral Lower Ext shows no edema, both legs are warm to touch with = pulse throughout Neurology:  CN II-XII grossly intact, Non focal.   Psych:  TP linear. J/I WNL. Normal speech. Appropriate eye contact and affect.  Skin:  No Rash  Data Review Lab Results  Component Value Date   HGBA1C 5.1 12/01/2013   HGBA1C 5.2 04/12/2012     Assessment & Plan   1. Vaginal discharge Likely hsv1 related - Urinalysis Dipstick - Urine cytology ancillary only  2. Tick bite, initial encounter Will check titer-she had a round of doxy about the time she found the ticks - [place] spotted fvr ab, IgM-blood - Lyme Disease Abs IgG, IgM, IFA, CSF  3. Arthralgia of both hands Concern for RA due to [place]ber[place]'s nodes.   - Sedimentation Rate - Ambulatory referral to Rheumatology - Vitamin D, 25-hydroxy  4. [place] nodes - Sedimentation Rate - Ambulatory referral to Rheumatology  5. Vaginal lesion - HSV(herpes simplex vrs) 1+2 ab-IgG Valtrex 1 g tid   Patient have been counseled extensively about nutrition and exercise  Return for keep appt with Dr Alvis Lemmings in July.  The patient was given clear instructions to go to ER or return to medical center if symptoms don't improve, worsen or new problems develop. The patient verbalized understanding. The patient was told to call to get lab results if they haven't heard anything in the next week.     Georgian Co, PA-C [facility] [place], [place] [phone]   06/15/2017, 2:52 PM

## 2017-06-15 NOTE — Progress Notes (Signed)
cPatient is here for her neuropathy pain on her hands. Pt. Have joint pain.  Pt. Would like Lyme disease blood work testing, due to patient stated she found tick her in vagina.   Pt. Stating she have vaginal burning and discharge.

## 2017-06-16 LAB — SPECIMEN STATUS REPORT

## 2017-06-16 LAB — URINE CYTOLOGY ANCILLARY ONLY
Chlamydia: NEGATIVE
Neisseria Gonorrhea: NEGATIVE
TRICH (WINDOWPATH): NEGATIVE

## 2017-06-17 LAB — LYME, IGM, EARLY TEST/REFLEX

## 2017-06-18 LAB — LYME DISEASE ABS IGG, IGM, IFA, CSF

## 2017-06-18 LAB — URINE CYTOLOGY ANCILLARY ONLY
Bacterial vaginitis: NEGATIVE
Candida vaginitis: NEGATIVE

## 2017-06-18 LAB — SEDIMENTATION RATE: Sed Rate: 28 mm/hr (ref 0–40)

## 2017-06-18 LAB — HSV(HERPES SIMPLEX VRS) I + II AB-IGG: HSV 1 GLYCOPROTEIN G AB, IGG: 11 {index} — AB (ref 0.00–0.90)

## 2017-06-18 LAB — VITAMIN D 25 HYDROXY (VIT D DEFICIENCY, FRACTURES): VIT D 25 HYDROXY: 22.8 ng/mL — AB (ref 30.0–100.0)

## 2017-06-18 LAB — ROCKY MTN SPOTTED FVR AB, IGM-BLOOD: RMSF IgM: 0.38 index (ref 0.00–0.89)

## 2017-06-20 ENCOUNTER — Other Ambulatory Visit: Payer: Self-pay | Admitting: Physician Assistant

## 2017-06-20 ENCOUNTER — Telehealth: Payer: Self-pay

## 2017-06-20 MED ORDER — VITAMIN D (ERGOCALCIFEROL) 1.25 MG (50000 UNIT) PO CAPS
50000.0000 [IU] | ORAL_CAPSULE | ORAL | 0 refills | Status: DC
Start: 2017-06-20 — End: 2017-11-17

## 2017-06-20 MED FILL — VIT D2 1.25 MG (50,000 UNIT: 1.25 MG | 28 days supply | Qty: 4 | Fill #0

## 2017-06-20 NOTE — Telephone Encounter (Signed)
-----   Message from Anders Simmonds, [place] sent at 06/20/2017 10:21 AM EDT ----- Please call patient and let them that their vitamin D is low.  This can contribute to muscle aches, anxiety, fatigue, and depression.  I have sent a prescription to the pharmacy for them to take once a week.  We will recheck this level in 3-4 months.  Her tests for tick fever were negative.  Her tests for vaginal or urine infection were negative, but she did test positive for cold sores which is likely what was causing the ulcer and burning in the vaginal area.  Her inflammatory tests were negative.  Follow-up as planned. Thanks, Georgian Co, PA-C

## 2017-06-20 NOTE — Telephone Encounter (Signed)
CMA attempt to call patient to inform her lab results. No answer and left a VM for patient.  Attempt to call patient cell number, did not rang.  If patient call back, please inform:  Please call patient and let them that their vitamin D is low.  This can contribute to muscle aches, anxiety, fatigue, and depression.  I have sent a prescription to the pharmacy for them to take once a week.  We will recheck this level in 3-4 months.  Her tests for tick fever were negative.  Her tests for vaginal or urine infection were negative, but she did test positive for cold sores which is likely what was causing the ulcer and burning in the vaginal area.  Her inflammatory tests were negative.  Follow-up as planned. Thanks, Georgian Co, PA-C

## 2017-06-21 ENCOUNTER — Telehealth: Payer: Self-pay

## 2017-06-21 NOTE — Telephone Encounter (Signed)
Patient called back regarding VM   Patient verify DOB   Patient was aware and understood lab results   Patient was aware she has medications to pick up at [facility] pharmacy

## 2017-06-24 ENCOUNTER — Telehealth: Payer: Self-pay | Admitting: Family Medicine

## 2017-06-24 NOTE — Telephone Encounter (Signed)
Patient called requesting to know that status of her Physical Medicine and Rheumatology Referral. Contacted the referral coordinator and she stated that patient was denied for the Rheumatology referral because she has no insurance. Patient was given the option to go to [facility] and apply for financial assistance or go to [employer] and pay $190. Patient was given the information and stated she would call [facility] and apply for financial assistance. Patient was also given the number for Physical Medicine and see when she can get scheduled since the referral has already been sent. Patient will call once she gets approved for financial assistance at [facility] so the Rheumatology referral can get sent.

## 2017-07-13 ENCOUNTER — Encounter (HOSPITAL_COMMUNITY): Payer: Self-pay | Admitting: *Deleted

## 2017-07-13 ENCOUNTER — Other Ambulatory Visit: Payer: Self-pay

## 2017-07-13 ENCOUNTER — Emergency Department (HOSPITAL_COMMUNITY)
Admission: EM | Admit: 2017-07-13 | Discharge: 2017-07-13 | Disposition: A | Discharge status: Home / Self Care | Payer: PRIVATE HEALTH INSURANCE | Attending: Emergency Medicine | Admitting: Emergency Medicine

## 2017-07-13 DIAGNOSIS — F17210 Nicotine dependence, cigarettes, uncomplicated: Secondary | ICD-10-CM | POA: Insufficient documentation

## 2017-07-13 DIAGNOSIS — Z23 Encounter for immunization: Secondary | ICD-10-CM | POA: Insufficient documentation

## 2017-07-13 DIAGNOSIS — T148XXA Other injury of unspecified body region, initial encounter: Secondary | ICD-10-CM

## 2017-07-13 DIAGNOSIS — W228XXA Striking against or struck by other objects, initial encounter: Secondary | ICD-10-CM | POA: Insufficient documentation

## 2017-07-13 DIAGNOSIS — Y929 Unspecified place or not applicable: Secondary | ICD-10-CM | POA: Insufficient documentation

## 2017-07-13 DIAGNOSIS — Y999 Unspecified external cause status: Secondary | ICD-10-CM | POA: Insufficient documentation

## 2017-07-13 DIAGNOSIS — Y939 Activity, unspecified: Secondary | ICD-10-CM | POA: Insufficient documentation

## 2017-07-13 DIAGNOSIS — S91052A Open bite, left ankle, initial encounter: Secondary | ICD-10-CM | POA: Insufficient documentation

## 2017-07-13 DIAGNOSIS — J45909 Unspecified asthma, uncomplicated: Secondary | ICD-10-CM | POA: Insufficient documentation

## 2017-07-13 DIAGNOSIS — Z79899 Other long term (current) drug therapy: Secondary | ICD-10-CM | POA: Insufficient documentation

## 2017-07-13 MED ORDER — TETANUS-DIPHTH-ACELL PERTUSSIS 5-2.5-18.5 LF-MCG/0.5 IM SUSP
0.5000 mL | Freq: Once | INTRAMUSCULAR | Status: AC
  Administered 2017-07-13: 0.5 mL via INTRAMUSCULAR
  Filled 2017-07-13: qty 0.5

## 2017-07-13 NOTE — ED Triage Notes (Signed)
The pt has a bite to her lt ankle medial side  She was bitten by something yesterday in the garden..  She did not see a snake  The area has been painful today with burning and stinging  No tgemp

## 2017-07-13 NOTE — Discharge Instructions (Addendum)
Wash the bite daily with soap and water.  You may put antibiotic ointment if you would like.  Keep it covered to keep it clean. Use Tylenol or ibuprofen as needed for pain. Aloe up with your primary care doctor as needed for reevaluation. Return to the emergency room if you develop high fevers, your leg swell significantly, you start bleeding, or with any new or concerning symptoms.

## 2017-07-13 NOTE — ED Provider Notes (Signed)
[facility] Provider Note   CSN: [id number] Arrival date & time: 07/13/17  2011     History   Chief Complaint Chief Complaint  Patient presents with  . Insect Bite    HPI Sylvia DesanctisBarbara Hall is a 57 y.o. female presenting for evaluation of animal bite on her left ankle.  Pt states she was ijn her garden last night when she felt a sharp stinging bite of her left ankle.  She looked around, did not see anything.  She went inside, she noticed 2 puncture marks.  Today she reports the area surrounding the bite is slightly more swollen.  There is been no bleeding or drainage.  She reports pain of her left calf, specifically with flexion of her ankle.  She denies pain in her foot.  She denies numbness or tingling.  She is not immunocompromised, is not on immunosuppressive drugs.  Her tetanus is not up-to-date.  She denies numbness or tingling.  She denies fevers, chills, chest pain, shortness of breath, nausea, vomiting, abdominal pain.  HPI  Past Medical History:  Diagnosis Date  . Anxiety   . Asthma   . Back pain, chronic   . Depression   . Epilepsy (HCC)   . Fatigue   . Mitral valve prolapse   . Pericarditis   . PTSD (post-traumatic stress disorder)   . Stenosis of lumbosacral spine    numbness L hand and L leg  . Thrombophlebitis    Age 57    Patient Active Problem List   Diagnosis Date Noted  . Left sided numbness 12/01/2013  . Acute sinusitis, unspecified 12/08/2012  . Muscle spasm of right leg 12/08/2012  . Acute upper respiratory infections of unspecified site 12/08/2012  . Major depressive disorder, recurrent episode, severe, without mention of psychotic behavior 12/07/2012  . Borderline personality disorder (HCC) 10/19/2012  . Acute posttraumatic stress disorder 08/24/2012  . Palpitations 08/16/2012  . Neuropathy 08/16/2012  . Restless leg syndrome 08/16/2012  . Routine gynecological examination 07/31/2012  . Neurogenic pain  04/12/2012  . Anxiety 11/03/2011  . Vitamin D deficiency 08/08/2011  . Chronic back pain 05/29/2011  . MVP (mitral valve prolapse) 05/29/2011  . Asthma 05/29/2011  . Tobacco user 05/29/2011  . Chronic mastitis 05/29/2011  . Allergic rhinitis 05/29/2011    Past Surgical History:  Procedure Laterality Date  . APPENDECTOMY    . BIOPSY BREAST     Right breast x 10  . BREAST LUMPECTOMY     Right breast  . CESAREAN SECTION     x 2  . NOSE SURGERY     s/p trauma  . OVARY SURGERY    . RHINOPLASTY    . UTERINE FIBROID SURGERY       OB History    Gravida  2   Para  2   Term  0   Preterm  2   AB      Living  2     SAB      TAB      Ectopic      Multiple      Live Births               Home Medications    Prior to Admission medications   Medication Sig Start Date End Date Taking? Authorizing Provider  albuterol (PROVENTIL HFA;VENTOLIN HFA) 108 (90 Base) MCG/ACT inhaler Inhale 2 puffs into the lungs every 6 (six) hours as needed for wheezing or shortness of breath.  04/21/17   Anders Simmonds, PA-C  doxycycline (VIBRA-TABS) 100 MG tablet Take 1 tablet (100 mg total) by mouth 2 (two) times daily. Patient not taking: Reported on 05/18/2017 04/21/17   Anders Simmonds, PA-C  gabapentin (NEURONTIN) 300 MG capsule Take 2 capsules (600 mg total) by mouth 2 (two) times daily. 05/18/17   Hoy Register, MD  methocarbamol (ROBAXIN) 500 MG tablet Take 2 tablets (1,000 mg total) by mouth 2 (two) times daily as needed for muscle spasms. 05/18/17   Hoy Register, MD  metoprolol succinate (TOPROL-XL) 50 MG 24 hr tablet Take 1 tablet (50 mg total) by mouth daily. Take with or immediately following a meal. 04/04/17   Rollene Rotunda, MD  metoprolol tartrate (LOPRESSOR) 25 MG tablet Take 0.5 tablets (12.5 mg total) by mouth as needed. For palpitations 04/04/17 07/03/17  Rollene Rotunda, MD  mupirocin ointment (BACTROBAN) 2 % Apply to AA bid X 7-10 days Patient not taking: Reported  on 05/18/2017 04/07/17   Anders Simmonds, PA-C  predniSONE (DELTASONE) 10 MG tablet 6,5,4,3,2,1 take each days dose in am with food 06/15/17   Georgian Co M, PA-C  rOPINIRole (REQUIP XL) 2 MG 24 hr tablet Take 1 tablet (2 mg total) by mouth at bedtime. 04/21/17   Anders Simmonds, PA-C  valACYclovir (VALTREX) 1000 MG tablet Take 1 tablet (1,000 mg total) by mouth 3 (three) times daily. 06/15/17   Anders Simmonds, PA-C  Vitamin D, Ergocalciferol, (DRISDOL) 50000 units CAPS capsule Take 1 capsule (50,000 Units total) by mouth every 7 (seven) days. 06/20/17   Anders Simmonds, PA-C    Family History Family History  Problem Relation Age of Onset  . Diabetes Mother   . Mitral valve prolapse Mother   . Asthma Mother   . CAD Mother 37  . Anxiety disorder Mother   . Mitral valve prolapse Sister   . Asthma Sister   . Mitral valve prolapse Son   . Alcohol abuse Son   . Diabetes Maternal Uncle   . Diabetes Maternal Grandmother   . Mitral valve prolapse Sister   . CAD Son 30       No details available  . Alcohol abuse Father   . Alcohol abuse Brother     Social History Social History   Tobacco Use  . Smoking status: Current Every Day Smoker    Packs/day: 0.50    Years: 12.00    Pack years: 6.00    Types: Cigarettes  . Smokeless tobacco: Never Used  Substance Use Topics  . Alcohol use: No    Alcohol/week: 0.0 oz  . Drug use: No     Allergies   Phenytoin sodium extended; Caffeine; Dilantin [phenytoin sodium extended]; Food; Ivp dye [iodinated diagnostic agents]; Levofloxacin; Sulfa drugs cross reactors; Aspirin; Penicillins; Pregabalin; and Sodium nitrate   Review of Systems Review of Systems  Constitutional: Negative for chills and fever.  Respiratory: Negative for shortness of breath.   Cardiovascular: Negative for chest pain.  Gastrointestinal: Negative for nausea and vomiting.  Musculoskeletal: Positive for myalgias.  Skin: Positive for wound.  Allergic/Immunologic:  Negative for immunocompromised state.  Neurological: Negative for headaches.  Hematological: Does not bruise/bleed easily.  Psychiatric/Behavioral: Negative for confusion.     Physical Exam Updated Vital Signs Ht 5\' 3"  (1.6 m)   Wt 81.6 kg (180 lb)   BMI 31.89 kg/m   Physical Exam  Constitutional: She is oriented to person, place, and time. She appears well-developed and well-nourished. No distress.  Patient appears in no distress  HENT:  Head: Normocephalic and atraumatic.  Eyes: Pupils are equal, round, and reactive to light. EOM are normal.  Neck: Normal range of motion.  Cardiovascular: Normal rate, regular rhythm and intact distal pulses.  Pulmonary/Chest: Effort normal and breath sounds normal. No respiratory distress. She has no wheezes.  Abdominal: She exhibits no distension. There is no tenderness. There is no guarding.  Musculoskeletal: Normal range of motion.  Tenderness to palpation of left calf.  No obvious swelling.  Pedal pulses intact bilaterally.  Sensation intact bilaterally.  Good cap refill.  Patient is ambulatory.  Pain with active and passive dorsiflexion of the ankle.  Neurological: She is alert and oriented to person, place, and time. No sensory deficit.  Skin: Skin is warm. Capillary refill takes less than 2 seconds. No rash noted.  Puncture wound of the left medial lower leg without active bleeding or draining.  Mild surrounding ecchymosis without significant erythema, warmth, or swelling. (see picture)  Psychiatric: She has a normal mood and affect.  Nursing note and vitals reviewed.          ED Treatments / Results  Labs (all labs ordered are listed, but only abnormal results are displayed) Labs Reviewed - No data to display  EKG None  Radiology No results found.  Procedures Procedures (including critical care time)  Medications Ordered in ED Medications  Tdap (BOOSTRIX) injection 0.5 mL (0.5 mLs Intramuscular Given 07/13/17 2317)      Initial Impression / Assessment and Plan / ED Course  I have reviewed the triage vital signs and the nursing notes.  Pertinent labs & imaging results that were available during my care of the patient were reviewed by me and considered in my medical decision making (see chart for details).     Pt presenting for evaluation of bite of the medial left lower leg.  Physical exam shows clearly defined 2 puncture marks with mild surrounding ecchymosis.  No bleeding, drainage, warmth, or signs of infection.  No signs of ulceration.  This occurred 24 hours ago.  Discussed with attending, Dr. Adela Lank agrees to plan.  At this time, I do not believe CroFab is necessary, as there are no indications of coagulopathy or significant swelling.  Will update tetanus.  Discussed wound care.  At this time, patient proceed for discharge.  Return percussions given.  Patient states she understands and agrees to plan.  Final Clinical Impressions(s) / ED Diagnoses   Final diagnoses:  Bite by animal    ED Discharge Orders    None       Alveria Apley, PA-C 07/13/17 2319    Melene Plan, DO 07/14/17 1146

## 2017-07-14 MED FILL — METHOCARBAMOL 500 MG TABS: 500 | 30 days supply | Qty: 120 | Fill #2

## 2017-07-14 MED FILL — METOPROLOL SUCCINATE ER 50: 50 | 30 days supply | Qty: 30 | Fill #3

## 2017-07-14 MED FILL — GABAPENTIN 300 MG CAPSULE: 300 | 30 days supply | Qty: 120 | Fill #2

## 2017-07-14 MED FILL — VIT D2 1.25 MG (50,000 UNIT: 1.25 MG | 28 days supply | Qty: 4 | Fill #1

## 2017-07-18 MED FILL — rOPINIRole HCL ER 2 MG TB24: 2 | 30 days supply | Qty: 30 | Fill #2

## 2017-08-16 MED FILL — GABAPENTIN 300 MG CAPSULE: 300 | 30 days supply | Qty: 120 | Fill #3

## 2017-08-16 MED FILL — METOPROLOL SUCCINATE ER 50: 50 | 30 days supply | Qty: 30 | Fill #4

## 2017-08-16 MED FILL — METHOCARBAMOL 500 MG TABS: 500 | 30 days supply | Qty: 120 | Fill #3

## 2017-08-17 ENCOUNTER — Encounter: Payer: Self-pay | Admitting: Family Medicine

## 2017-08-17 ENCOUNTER — Ambulatory Visit: Payer: Self-pay | Attending: Family Medicine | Admitting: Family Medicine

## 2017-08-17 VITALS — BP 102/65 | HR 71 | Temp 98.3°F | Ht 63.0 in | Wt 182.4 lb

## 2017-08-17 DIAGNOSIS — G5622 Lesion of ulnar nerve, left upper limb: Secondary | ICD-10-CM

## 2017-08-17 DIAGNOSIS — Z886 Allergy status to analgesic agent status: Secondary | ICD-10-CM | POA: Insufficient documentation

## 2017-08-17 DIAGNOSIS — Z79899 Other long term (current) drug therapy: Secondary | ICD-10-CM | POA: Insufficient documentation

## 2017-08-17 DIAGNOSIS — J45909 Unspecified asthma, uncomplicated: Secondary | ICD-10-CM | POA: Insufficient documentation

## 2017-08-17 DIAGNOSIS — G5620 Lesion of ulnar nerve, unspecified upper limb: Secondary | ICD-10-CM | POA: Insufficient documentation

## 2017-08-17 DIAGNOSIS — E7800 Pure hypercholesterolemia, unspecified: Secondary | ICD-10-CM | POA: Insufficient documentation

## 2017-08-17 DIAGNOSIS — G8929 Other chronic pain: Secondary | ICD-10-CM | POA: Insufficient documentation

## 2017-08-17 DIAGNOSIS — F4310 Post-traumatic stress disorder, unspecified: Secondary | ICD-10-CM | POA: Insufficient documentation

## 2017-08-17 DIAGNOSIS — Z882 Allergy status to sulfonamides status: Secondary | ICD-10-CM | POA: Insufficient documentation

## 2017-08-17 DIAGNOSIS — F329 Major depressive disorder, single episode, unspecified: Secondary | ICD-10-CM | POA: Insufficient documentation

## 2017-08-17 DIAGNOSIS — M5136 Other intervertebral disc degeneration, lumbar region: Secondary | ICD-10-CM | POA: Insufficient documentation

## 2017-08-17 DIAGNOSIS — M545 Low back pain: Secondary | ICD-10-CM | POA: Insufficient documentation

## 2017-08-17 DIAGNOSIS — G2581 Restless legs syndrome: Secondary | ICD-10-CM | POA: Insufficient documentation

## 2017-08-17 DIAGNOSIS — I341 Nonrheumatic mitral (valve) prolapse: Secondary | ICD-10-CM | POA: Insufficient documentation

## 2017-08-17 DIAGNOSIS — G40909 Epilepsy, unspecified, not intractable, without status epilepticus: Secondary | ICD-10-CM | POA: Insufficient documentation

## 2017-08-17 DIAGNOSIS — M5412 Radiculopathy, cervical region: Secondary | ICD-10-CM | POA: Insufficient documentation

## 2017-08-17 DIAGNOSIS — J4520 Mild intermittent asthma, uncomplicated: Secondary | ICD-10-CM

## 2017-08-17 DIAGNOSIS — E785 Hyperlipidemia, unspecified: Secondary | ICD-10-CM | POA: Insufficient documentation

## 2017-08-17 DIAGNOSIS — R05 Cough: Secondary | ICD-10-CM | POA: Insufficient documentation

## 2017-08-17 MED ORDER — GABAPENTIN 300 MG PO CAPS: 600.0000 mg | ORAL_CAPSULE | Freq: Two times a day (BID) | ORAL | 3 refills | Status: DC

## 2017-08-17 MED ORDER — ROPINIROLE HCL ER 2 MG PO TB24: 2.0000 mg | ORAL_TABLET | Freq: Every day | ORAL | 2 refills | Status: DC

## 2017-08-17 MED ORDER — LIDOCAINE 5 % EX PTCH: 1.0000 | MEDICATED_PATCH | CUTANEOUS | 3 refills | Status: DC

## 2017-08-17 MED ORDER — METHOCARBAMOL 500 MG PO TABS: 1000.0000 mg | ORAL_TABLET | Freq: Two times a day (BID) | ORAL | 3 refills | Status: DC | PRN

## 2017-08-17 MED FILL — rOPINIRole HCL ER 2 MG TB24: 2 | 30 days supply | Qty: 30 | Fill #0

## 2017-08-17 NOTE — Progress Notes (Signed)
Subjective:  Patient ID: Sylvia DesanctisBarbara Hall, female    DOB: 08/30/1960  Age: 57 y.o. MRN: [id number]  CC: Asthma and Cough   HPI Sylvia DesanctisBarbara Hall is a 57 year old female with a history of  Asthma, chronic neck and lower back pain with associated radiculopathy (MRI of the c-spine and L spine from an outside hospital from 12/2006   revealed mild degenerative changes, annular bulges at L2-3,3-4,4-5 and neural foramina narrowing.C-spine reveals C3-4, 4-5 foraminal narrowing) I had referred her to physical medicine and rehab however she is yet to hear from them; her dose of gabapentin had also been increased at her last office visit. She complains of shooting pains from her neck down left arm with reduced strength in her left hand.  Also complains of new onset numbness in the medial three and half fingers of her left hand and numbness and the tips of her right fingers.  Her fingers cramp up like charley horses. Symptoms are not controlled on her current regimen of Robaxin and gabapentin She complains of right sided throat pain, wheezing and has been sleeping in the hospital lately due to her boyfriend being hospitalized.  Denies fever, sinus tenderness or myalgias.  Past Medical History:  Diagnosis Date  . Anxiety   . Asthma   . Back pain, chronic   . Depression   . Epilepsy (HCC)   . Fatigue   . Mitral valve prolapse   . Pericarditis   . PTSD (post-traumatic stress disorder)   . Stenosis of lumbosacral spine    numbness L hand and L leg  . Thrombophlebitis    Age 57    Past Surgical History:  Procedure Laterality Date  . APPENDECTOMY    . BIOPSY BREAST     Right breast x 10  . BREAST LUMPECTOMY     Right breast  . CESAREAN SECTION     x 2  . NOSE SURGERY     s/p trauma  . OVARY SURGERY    . RHINOPLASTY    . UTERINE FIBROID SURGERY      Allergies  Allergen Reactions  . Phenytoin Sodium Extended Other (See Comments)    seizures  . Caffeine Other (See Comments)    Heart races  .  Dilantin [Phenytoin Sodium Extended] Other (See Comments)    Seizure   . Food Other (See Comments)    Sodium nitrates-headaches   . Ivp Dye [Iodinated Diagnostic Agents] Hives  . Levofloxacin Hives  . Sulfa Drugs Cross Reactors Hives  . Aspirin Other (See Comments)    Upset stomach, resolves with GERD meds   . Penicillins Rash    Pt called in stating she  Was prescribed Pen VK in ED and developed a generalized raised rash.  She has taken amoxicillin and augmentin since this time and not had any problem  . Pregabalin Rash  . Sodium Nitrate Other (See Comments)    headaches     Outpatient Medications Prior to Visit  Medication Sig Dispense Refill  . albuterol (PROVENTIL HFA;VENTOLIN HFA) 108 (90 Base) MCG/ACT inhaler Inhale 2 puffs into the lungs every 6 (six) hours as needed for wheezing or shortness of breath. 1 Inhaler 1  . metoprolol succinate (TOPROL-XL) 50 MG 24 hr tablet Take 1 tablet (50 mg total) by mouth daily. Take with or immediately following a meal. 60 tablet 11  . gabapentin (NEURONTIN) 300 MG capsule Take 2 capsules (600 mg total) by mouth 2 (two) times daily. 120 capsule 3  .  methocarbamol (ROBAXIN) 500 MG tablet Take 2 tablets (1,000 mg total) by mouth 2 (two) times daily as needed for muscle spasms. 120 tablet 3  . rOPINIRole (REQUIP XL) 2 MG 24 hr tablet Take 1 tablet (2 mg total) by mouth at bedtime. 30 tablet 2  . doxycycline (VIBRA-TABS) 100 MG tablet Take 1 tablet (100 mg total) by mouth 2 (two) times daily. (Patient not taking: Reported on 05/18/2017) 20 tablet 0  . metoprolol tartrate (LOPRESSOR) 25 MG tablet Take 0.5 tablets (12.5 mg total) by mouth as needed. For palpitations 30 tablet 3  . mupirocin ointment (BACTROBAN) 2 % Apply to AA bid X 7-10 days (Patient not taking: Reported on 05/18/2017) 22 g 0  . predniSONE (DELTASONE) 10 MG tablet 6,5,4,3,2,1 take each days dose in am with food (Patient not taking: Reported on 08/17/2017) 21 tablet 0  . valACYclovir  (VALTREX) 1000 MG tablet Take 1 tablet (1,000 mg total) by mouth 3 (three) times daily. (Patient not taking: Reported on 08/17/2017) 21 tablet 0  . Vitamin D, Ergocalciferol, (DRISDOL) 50000 units CAPS capsule Take 1 capsule (50,000 Units total) by mouth every 7 (seven) days. (Patient not taking: Reported on 08/17/2017) 16 capsule 0   No facility-administered medications prior to visit.     ROS Review of Systems  Constitutional: Negative for activity change, appetite change and fatigue.  HENT: Negative for congestion, sinus pressure and sore throat.   Eyes: Negative for visual disturbance.  Respiratory: Positive for wheezing. Negative for cough, chest tightness and shortness of breath.   Cardiovascular: Negative for chest pain and palpitations.  Gastrointestinal: Negative for abdominal distention, abdominal pain and constipation.  Endocrine: Negative for polydipsia.  Genitourinary: Negative for dysuria and frequency.  Musculoskeletal: Positive for back pain. Negative for arthralgias.  Skin: Negative for rash.  Neurological: Positive for numbness. Negative for tremors and light-headedness.  Hematological: Does not bruise/bleed easily.  Psychiatric/Behavioral: Negative for agitation and behavioral problems.    Objective:  BP 102/65   Pulse 71   Temp 98.3 F (36.8 C) (Oral)   Ht 5\' 3"  (1.6 m)   Wt 182 lb 6.4 oz (82.7 kg)   SpO2 97%   BMI 32.31 kg/m   BP/Weight 08/17/2017 07/13/2017 06/15/2017  Systolic BP 102 - 110  Diastolic BP 65 - 76  Wt. (Lbs) 182.4 180 182.2  BMI 32.31 31.89 32.28  Some encounter information is confidential and restricted. Go to Review Flowsheets activity to see all data.      Physical Exam  Constitutional: She is oriented to person, place, and time. She appears well-developed and well-nourished.  Cardiovascular: Normal rate, normal heart sounds and intact distal pulses.  No murmur heard. Pulmonary/Chest: Effort normal and breath sounds normal. She has no  wheezes. She has no rales. She exhibits no tenderness.  Abdominal: Soft. Bowel sounds are normal. She exhibits no distension and no mass. There is no tenderness.  Musculoskeletal: Normal range of motion.  Neurological: She is alert and oriented to person, place, and time. A sensory deficit (sensory loss on palmar aspect of medial three fingers) is present.  Reduced hand grip in left hand; normal and right  Skin: Skin is warm and dry.     Assessment & Plan:   1. Cervical radiculopathy Uncontrolled We will follow-up with the referral coordinator regarding previously placed referral to physical medicine and rehab - gabapentin (NEURONTIN) 300 MG capsule; Take 2 capsules (600 mg total) by mouth 2 (two) times daily.  Dispense: 120 capsule; Refill: 3  2. Restless leg syndrome Stable - rOPINIRole (REQUIP XL) 2 MG 24 hr tablet; Take 1 tablet (2 mg total) by mouth at bedtime.  Dispense: 30 tablet; Refill: 2  3. Ulnar neuropathy of left upper extremity Will need to use wrist brace - AMB referral to orthopedics  4. Degenerative disc disease, lumbar Uncontrolled Lidoderm patch added to regimen Consider addition of NSAIDs and Cymbalta if symptoms persist Advised to apply for [facility] discount to facilitate referral process Previously referred to physical medicine and rehab - lidocaine (LIDODERM) 5 %; Place 1 patch onto the skin daily. Remove & Discard patch within 12 hours or as directed by MD  Dispense: 30 patch; Refill: 3 - methocarbamol (ROBAXIN) 500 MG tablet; Take 2 tablets (1,000 mg total) by mouth 2 (two) times daily as needed for muscle spasms.  Dispense: 120 tablet; Refill: 3  5. Pure hypercholesterolemia Currently not on statin Attributes elevated cholesterol to a keto diet She is currently working on lifestyle modifications, will repeat lipid panel at next visit  6.  Asthma Stable, no evidence of exacerbation No antibiotic indicated Advised to use Zyrtec for possible sinus  component and continue with MDI  Meds ordered this encounter  Medications  . lidocaine (LIDODERM) 5 %    Sig: Place 1 patch onto the skin daily. Remove & Discard patch within 12 hours or as directed by MD    Dispense:  30 patch    Refill:  3  . methocarbamol (ROBAXIN) 500 MG tablet    Sig: Take 2 tablets (1,000 mg total) by mouth 2 (two) times daily as needed for muscle spasms.    Dispense:  120 tablet    Refill:  3  . gabapentin (NEURONTIN) 300 MG capsule    Sig: Take 2 capsules (600 mg total) by mouth 2 (two) times daily.    Dispense:  120 capsule    Refill:  3  . rOPINIRole (REQUIP XL) 2 MG 24 hr tablet    Sig: Take 1 tablet (2 mg total) by mouth at bedtime.    Dispense:  30 tablet    Refill:  2    Follow-up: No follow-ups on file.   Hoy Register MD

## 2017-08-17 NOTE — Patient Instructions (Signed)

## 2017-08-26 ENCOUNTER — Ambulatory Visit: Payer: Self-pay | Attending: Family Medicine

## 2017-08-26 MED FILL — LIDOCAINE PATCH 5%: 5 | 30 days supply | Qty: 30 | Fill #0

## 2017-08-26 MED FILL — VIT D2 1.25 MG (50,000 UNIT: 1.25 MG | 28 days supply | Qty: 4 | Fill #2

## 2017-09-07 ENCOUNTER — Ambulatory Visit (INDEPENDENT_AMBULATORY_CARE_PROVIDER_SITE_OTHER): Payer: Self-pay | Admitting: Orthopedic Surgery

## 2017-09-07 ENCOUNTER — Ambulatory Visit (INDEPENDENT_AMBULATORY_CARE_PROVIDER_SITE_OTHER): Payer: Self-pay

## 2017-09-07 ENCOUNTER — Encounter (INDEPENDENT_AMBULATORY_CARE_PROVIDER_SITE_OTHER): Payer: Self-pay | Admitting: Orthopedic Surgery

## 2017-09-07 DIAGNOSIS — M542 Cervicalgia: Secondary | ICD-10-CM

## 2017-09-07 DIAGNOSIS — M5412 Radiculopathy, cervical region: Secondary | ICD-10-CM

## 2017-09-07 NOTE — Progress Notes (Signed)
Office Visit Note   Patient: Sylvia Hall           Date of Birth: October 19, 1960           MRN: [id number] Visit Date: 09/07/2017 Requested by: Hoy Register, MD [address] [place], [place] [address] PCP: Hoy Register, MD  Subjective: Chief Complaint  Patient presents with  . Neck - Pain    HPI: Patient presents with bilateral hand numbness and tingling as well as neck pain.  Fairly incapacitating pain that the patient has been in for several years.  She moved here from [place].  She reports pain with radiation into that left arm along with numbness and tingling primarily on the dorsal aspect of the left hand.  She also reports some palmar paresthesias on the right hand.  Describes decreased strength in the left upper extremity.  Old notes were reviewed and she had an MRI scan of her cervical spine in 2008 which showed early mild left foraminal narrowing at C3-4 and C4-5.  She does have history of cervical spine epidural steroid injections in the past.  Currently she is taking Neurontin and Robaxin.  She has not had narcotics for the problem in 7 years but at times prior to that she was on high doses.              ROS: All systems reviewed are negative as they relate to the chief complaint within the history of present illness.  Patient denies  fevers or chills.   Assessment & Plan: Visit Diagnoses:  1. Neck pain   2. Radiculopathy, cervical region     Plan: Impression is neck pain and left wrist pain and right hand paresthesias.  This could represent some combination of carpal tunnel syndrome and cervical radiculopathy.  Plain radiographs unremarkable.  Plan is referral to Dr. Alvester Morin for nerve conduction study both hands an MRI scan of cervical spine due to the generally incapacitating nature of the pain.  I think it is likely that some of the pathology that was present in 2008 has progressed.  I will see her back after that study  Follow-Up Instructions: Return for after  MRI.   Orders:  Orders Placed This Encounter  Procedures  . XR Cervical Spine 2 or 3 views  . MR Cervical Spine w/o contrast  . Ambulatory referral to Physical Medicine Rehab   No orders of the defined types were placed in this encounter.     Procedures: No procedures performed   Clinical Data: No additional findings.  Objective: Vital Signs: There were no vitals taken for this visit.  Physical Exam:   Constitutional: Patient appears well-developed HEENT:  Head: Normocephalic Eyes:EOM are normal Neck: Normal range of motion Cardiovascular: Normal rate Pulmonary/chest: Effort normal Neurologic: Patient is alert Skin: Skin is warm Psychiatric: Patient has normal mood and affect    Ortho Exam: Ortho exam demonstrates good cervical spine range of motion but with some pain with rotation to the left.  Patient does have some mild paresthesias in the left C6 distribution compared to the right.  Reflexes symmetric bilateral biceps and triceps.  Radial pulse intact bilaterally.  Has negative Tinel's cubital tunnel bilaterally.  Does have definite paresthesias on the dorsal aspect of the left hand compared to the right.  No muscle atrophy in the arms present.  Specialty Comments:  No specialty comments available.  Imaging: Xr Cervical Spine 2 Or 3 Views  Result Date: 09/07/2017 AP lateral cervical spine reviewed.  Normal lordosis is present.  Minimal facet arthritis is present.  Some osteopenia is noted.  Visualized lung fields clear.  No significant degenerative disc disease present.    PMFS History: Patient Active Problem List   Diagnosis Date Noted  . Degenerative disc disease, lumbar 08/17/2017  . Hyperlipidemia 08/17/2017  . Left sided numbness 12/01/2013  . Acute sinusitis, unspecified 12/08/2012  . Muscle spasm of right leg 12/08/2012  . Acute upper respiratory infections of unspecified site 12/08/2012  . Major depressive disorder, recurrent episode, severe,  without mention of psychotic behavior 12/07/2012  . Borderline personality disorder (HCC) 10/19/2012  . Acute posttraumatic stress disorder 08/24/2012  . Palpitations 08/16/2012  . Neuropathy 08/16/2012  . Restless leg syndrome 08/16/2012  . Routine gynecological examination 07/31/2012  . Neurogenic pain 04/12/2012  . Anxiety 11/03/2011  . Vitamin D deficiency 08/08/2011  . Chronic back pain 05/29/2011  . MVP (mitral valve prolapse) 05/29/2011  . Asthma 05/29/2011  . Tobacco user 05/29/2011  . Chronic mastitis 05/29/2011  . Allergic rhinitis 05/29/2011   Past Medical History:  Diagnosis Date  . Anxiety   . Asthma   . Back pain, chronic   . Depression   . Epilepsy (HCC)   . Fatigue   . Mitral valve prolapse   . Pericarditis   . PTSD (post-traumatic stress disorder)   . Stenosis of lumbosacral spine    numbness L hand and L leg  . Thrombophlebitis    Age 57    Family History  Problem Relation Age of Onset  . Diabetes Mother   . Mitral valve prolapse Mother   . Asthma Mother   . CAD Mother 3473  . Anxiety disorder Mother   . Mitral valve prolapse Sister   . Asthma Sister   . Mitral valve prolapse Son   . Alcohol abuse Son   . Diabetes Maternal Uncle   . Diabetes Maternal Grandmother   . Mitral valve prolapse Sister   . CAD Son [id number]       No details available  . Alcohol abuse Father   . Alcohol abuse Brother     Past Surgical History:  Procedure Laterality Date  . APPENDECTOMY    . BIOPSY BREAST     Right breast x 10  . BREAST LUMPECTOMY     Right breast  . CESAREAN SECTION     x 2  . NOSE SURGERY     s/p trauma  . OVARY SURGERY    . RHINOPLASTY    . UTERINE FIBROID SURGERY     Social History   Occupational History  . Not on file  Tobacco Use  . Smoking status: Current Every Day Smoker    Packs/day: 0.50    Years: 12.00    Pack years: 6.00    Types: Cigarettes  . Smokeless tobacco: Never Used  Substance and Sexual Activity  . Alcohol use: No     Alcohol/week: 0.0 oz  . Drug use: No  . Sexual activity: Not Currently    Birth control/protection: None

## 2017-09-13 ENCOUNTER — Telehealth (INDEPENDENT_AMBULATORY_CARE_PROVIDER_SITE_OTHER): Payer: Self-pay | Admitting: Orthopedic Surgery

## 2017-09-13 NOTE — Telephone Encounter (Signed)
Patient called advised she is allergic to the IVP dye and would need a 13 hour prep prior to the MRI. Patient  spoke with [facility] and was told they do not have an open MRI machine. Patient said  she is claustrophobic and [facility]. The number to contact patient is [phone]

## 2017-09-14 NOTE — Telephone Encounter (Signed)
IC and left vm to return my call.

## 2017-09-15 MED FILL — METOPROLOL SUCCINATE ER 50: 50 | 30 days supply | Qty: 30 | Fill #5

## 2017-09-15 MED FILL — rOPINIRole HCL ER 2 MG TB24: 2 | 30 days supply | Qty: 30 | Fill #1

## 2017-09-15 NOTE — Telephone Encounter (Signed)
IC pt again this am, left vm

## 2017-09-16 ENCOUNTER — Telehealth: Payer: Self-pay | Admitting: Family Medicine

## 2017-09-16 MED FILL — METHOCARBAMOL 500 MG TABS: 500 | 30 days supply | Qty: 120 | Fill #0

## 2017-09-16 MED FILL — GABAPENTIN 300 MG CAPSULE: 300 | 30 days supply | Qty: 120 | Fill #0

## 2017-09-16 NOTE — Telephone Encounter (Signed)
Pt came in to request a pain management referral  she has the OC and its due to her neck and back pain which she was seen previously by [facility] on 09/07/2017 , please follow up

## 2017-09-19 ENCOUNTER — Telehealth (INDEPENDENT_AMBULATORY_CARE_PROVIDER_SITE_OTHER): Payer: Self-pay | Admitting: Radiology

## 2017-09-19 ENCOUNTER — Other Ambulatory Visit: Payer: Self-pay | Admitting: Family Medicine

## 2017-09-19 DIAGNOSIS — M51369 Other intervertebral disc degeneration, lumbar region without mention of lumbar back pain or lower extremity pain: Secondary | ICD-10-CM

## 2017-09-19 DIAGNOSIS — M5136 Other intervertebral disc degeneration, lumbar region: Secondary | ICD-10-CM

## 2017-09-19 NOTE — Telephone Encounter (Signed)
I s/w husband, wife has left 3 vc mails, asking about MRI.  She was concerned of any dye, there is no dye to be used.  She Zani Kyllonen need open scanner, due to claustro.  I will discuss with her when I call her tomorrow.

## 2017-09-19 NOTE — Progress Notes (Signed)
Referral has been placed. 

## 2017-09-19 NOTE — Telephone Encounter (Signed)
Referral has been placed. 

## 2017-09-20 ENCOUNTER — Other Ambulatory Visit (INDEPENDENT_AMBULATORY_CARE_PROVIDER_SITE_OTHER): Payer: Self-pay | Admitting: Radiology

## 2017-09-20 ENCOUNTER — Telehealth (INDEPENDENT_AMBULATORY_CARE_PROVIDER_SITE_OTHER): Payer: Self-pay | Admitting: Radiology

## 2017-09-20 DIAGNOSIS — M542 Cervicalgia: Secondary | ICD-10-CM

## 2017-09-20 MED ORDER — DIAZEPAM 10 MG PO TABS: ORAL_TABLET | ORAL | 0 refills | Status: DC

## 2017-09-20 NOTE — Telephone Encounter (Signed)
IC spoke with patient and she says that [facility] not called her yet, I gave her their number to call. She has not cancelled [facility] appt for MRI, I messaged April to cancel. I will f/u with patient later this pm.  She will need valium Rx as well.

## 2017-09-21 ENCOUNTER — Ambulatory Visit (HOSPITAL_COMMUNITY): Payer: Self-pay

## 2017-09-22 NOTE — Telephone Encounter (Signed)
They do not have Val[facility] so rx will need to be sent else where.

## 2017-09-22 NOTE — Telephone Encounter (Signed)
Call patient to see where she wants this sent.

## 2017-09-23 NOTE — Telephone Encounter (Signed)
IC pt and LMVM to call back and tell me what pharmacy to send the Rx to.

## 2017-09-26 MED ORDER — DIAZEPAM 10 MG PO TABS: ORAL_TABLET | ORAL | 0 refills | Status: DC

## 2017-09-26 MED FILL — diazePAM 10 MG TABS: 10 | 3 days supply | Qty: 3 | Fill #0

## 2017-09-26 NOTE — Addendum Note (Signed)
Addended by: Cherre HugerMAY, Dwyne Hasegawa E on: 09/26/2017 02:09 PM   Modules accepted: Orders

## 2017-09-26 NOTE — Telephone Encounter (Signed)
Patient came in, questioning about rx, [facility].

## 2017-09-27 ENCOUNTER — Ambulatory Visit (INDEPENDENT_AMBULATORY_CARE_PROVIDER_SITE_OTHER): Payer: Self-pay | Admitting: Physical Medicine and Rehabilitation

## 2017-09-27 ENCOUNTER — Encounter (INDEPENDENT_AMBULATORY_CARE_PROVIDER_SITE_OTHER): Payer: Self-pay | Admitting: Physical Medicine and Rehabilitation

## 2017-09-27 DIAGNOSIS — R202 Paresthesia of skin: Secondary | ICD-10-CM

## 2017-09-27 NOTE — Progress Notes (Signed)
.  Numeric Pain Rating Scale and Functional Assessment Average Pain 10   In the last MONTH (on 0-10 scale) has pain interfered with the following?  1. General activity like being  able to carry out your everyday physical activities such as walking, climbing stairs, carrying groceries, or moving a chair?  Rating(8)    
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
## 2017-09-27 NOTE — Procedures (Signed)
Impression: The above electrodiagnostic study is ABNORMAL and reveals evidence of:  1.  A very severe Left median nerve entrapment at the wrist (carpal tunnel syndrome) affecting sensory and motor components. The lesion is characterized by sensory and motor demyelination with evidence of axonal injury. Prognostically, they may have residual symptoms despite appropriate decompression.  1.  A severe Right median nerve entrapment at the wrist (carpal tunnel syndrome) affecting sensory and motor components. The lesion is characterized by sensory and motor demyelination with evidence of axonal injury. With active motor units and intact sensation this portends a better outcome after decompression compared to left.   There is no significant electrodiagnostic evidence of any other focal nerve entrapment, brachial plexopathy, cervical radiculopathy or generalized peripheral neuropathy.   Recommendations: 1.  Follow-up with referring physician. 2.  Continue current management of symptoms. 3.  Suggest surgical evaluation. May want to consider surgery on right side first given that patient is right hand dominant and electrodiagnostically and clinically appears to have better chance of recovery.  ___________________________ Naaman PlummerFred Hymen Arnett FAAPMR Board Certified, American Board of Physical Medicine and Rehabilitation    Nerve Conduction Studies Anti Sensory Summary Table   Stim Site NR Peak (ms) Norm Peak (ms) P-T Amp (V) Norm P-T Amp Site1 Site2 Delta-P (ms) Dist (cm) Vel (m/s) Norm Vel (m/s)  Left Median Acr Palm Anti Sensory (2nd Digit)  31.3C  Wrist *NR  <3.6  >10 Wrist Palm  0.0    Palm    *2.8 <2.0 8.8         Right Median Acr Palm Anti Sensory (2nd Digit)  32.7C  Wrist    *4.5 <3.6 *3.7 >10 Wrist Palm 0.4 0.0    Palm    *4.1 <2.0 10.7         Left Radial Anti Sensory (Base 1st Digit)  31.3C  Wrist    2.1 <3.1 12.4  Wrist Base 1st Digit 2.1 0.0    Right Radial Anti Sensory (Base 1st Digit)   32.4C  Wrist    2.1 <3.1 28.0  Wrist Base 1st Digit 2.1 0.0    Left Ulnar Anti Sensory (5th Digit)  31.5C  Wrist    3.3 <3.7 36.5 >15.0 Wrist 5th Digit 3.3 14.0 42 >38  Right Ulnar Anti Sensory (5th Digit)  33C  Wrist    3.3 <3.7 27.7 >15.0 Wrist 5th Digit 3.3 14.0 42 >38   Motor Summary Table   Stim Site NR Onset (ms) Norm Onset (ms) O-P Amp (mV) Norm O-P Amp Site1 Site2 Delta-0 (ms) Dist (cm) Vel (m/s) Norm Vel (m/s)  Left Median Motor (Abd Poll Brev)  31.5C  Wrist *NR  <4.2  >5 Elbow Wrist  20.0  >50  Elbow    8.2  0.4         Right Median Motor (Abd Poll Brev)  32C  Wrist    *8.3 <4.2 *2.4 >5 Elbow Wrist 4.4 19.5 *44 >50  Elbow    12.7  2.4         Left Ulnar Motor (Abd Dig Min)  31.6C  Wrist    2.8 <4.2 9.3 >3 B Elbow Wrist 3.1 19.0 61 >53  B Elbow    5.9  8.9  A Elbow B Elbow 1.2 10.0 83 >53  A Elbow    7.1  7.9         Right Ulnar Motor (Abd Dig Min)  31.8C  Wrist    2.9 <4.2 10.2 >3 B Elbow Wrist  3.0 18.5 62 >53  B Elbow    5.9  10.2  A Elbow B Elbow 1.1 10.0 91 >53  A Elbow    7.0  10.2          EMG   Side Muscle Nerve Root Ins Act Fibs Psw Amp Dur Poly Recrt Int Dennie BiblePat Comment  Left Abd Poll Brev Median C8-T1 *Decr *4+ *4+ Nml Nml 0 *Reduced Nml   Left 1stDorInt Ulnar C8-T1 Nml Nml Nml Nml Nml 0 Nml Nml   Left PronatorTeres Median C6-7 Nml Nml Nml Nml Nml 0 Nml Nml   Left Biceps Musculocut C5-6 Nml Nml Nml Nml Nml 0 Nml Nml   Left Deltoid Axillary C5-6 Nml Nml Nml Nml Nml 0 Nml Nml   Right Abd Poll Brev Median C8-T1 *Incr *4+ *4+ Nml Nml 0 *Reduced Nml     Nerve Conduction Studies Anti Sensory Left/Right Comparison   Stim Site L Lat (ms) R Lat (ms) L-R Lat (ms) L Amp (V) R Amp (V) L-R Amp (%) Site1 Site2 L Vel (m/s) R Vel (m/s) L-R Vel (m/s)  Median Acr Palm Anti Sensory (2nd Digit)  31.3C  Wrist  *4.5   *3.7  Wrist Palm     Palm *2.8 *4.1 1.3 8.8 10.7 17.8       Radial Anti Sensory (Base 1st Digit)  31.3C  Wrist 2.1 2.1 0.0 12.4 28.0 55.7 Wrist Base 1st  Digit     Ulnar Anti Sensory (5th Digit)  31.5C  Wrist 3.3 3.3 0.0 36.5 27.7 24.1 Wrist 5th Digit 42 42 0   Motor Left/Right Comparison   Stim Site L Lat (ms) R Lat (ms) L-R Lat (ms) L Amp (mV) R Amp (mV) L-R Amp (%) Site1 Site2 L Vel (m/s) R Vel (m/s) L-R Vel (m/s)  Median Motor (Abd Poll Brev)  31.5C  Wrist  *8.3   *2.4  Elbow Wrist  44   Elbow 8.2 12.7 4.5 0.4 2.4 83.3       Ulnar Motor (Abd Dig Min)  31.6C  Wrist 2.8 2.9 0.1 9.3 10.2 8.8 B Elbow Wrist 61 62 1  B Elbow 5.9 5.9 0.0 8.9 10.2 12.7 A Elbow B Elbow 83 91 8  A Elbow 7.1 7.0 0.1 7.9 10.2 22.5          Waveforms:

## 2017-09-28 ENCOUNTER — Ambulatory Visit
Admission: RE | Admit: 2017-09-28 | Discharge: 2017-09-28 | Disposition: A | Discharge status: Home / Self Care | Payer: No Typology Code available for payment source | Source: Ambulatory Visit | Attending: Orthopedic Surgery | Admitting: Orthopedic Surgery

## 2017-09-28 DIAGNOSIS — M542 Cervicalgia: Secondary | ICD-10-CM

## 2017-10-05 ENCOUNTER — Encounter (INDEPENDENT_AMBULATORY_CARE_PROVIDER_SITE_OTHER): Payer: Self-pay | Admitting: Orthopedic Surgery

## 2017-10-05 ENCOUNTER — Ambulatory Visit (INDEPENDENT_AMBULATORY_CARE_PROVIDER_SITE_OTHER): Payer: Self-pay

## 2017-10-05 ENCOUNTER — Ambulatory Visit (INDEPENDENT_AMBULATORY_CARE_PROVIDER_SITE_OTHER): Payer: Self-pay | Admitting: Orthopedic Surgery

## 2017-10-05 DIAGNOSIS — M5442 Lumbago with sciatica, left side: Secondary | ICD-10-CM

## 2017-10-05 DIAGNOSIS — M545 Low back pain: Secondary | ICD-10-CM

## 2017-10-05 DIAGNOSIS — G8929 Other chronic pain: Secondary | ICD-10-CM

## 2017-10-05 DIAGNOSIS — G5602 Carpal tunnel syndrome, left upper limb: Secondary | ICD-10-CM

## 2017-10-05 NOTE — Progress Notes (Signed)
Office Visit Note   Patient: Sylvia DesanctisBarbara Hall           Date of Birth: 08/01/1960           MRN: [id number] Visit Date: 10/05/2017 Requested by: Hoy RegisterNewlin, Enobong, MD [address] [place], [place] [id number] PCP: Hoy RegisterNewlin, Enobong, MD  Subjective: Chief Complaint  Patient presents with  . Neck - Pain, Follow-up    MRI Cspine review NCS review  . Lower Back - Pain, Follow-up    HPI: Sylvia MccreedyBarbara is a patient with significant multiple orthopedic issues.  Since I have seen her she has had MRI scan of cervical spine as well as EMG nerve study.  She also reports low back pain of long duration with some radiation into that left buttock area.  She is right-hand dominant.  Left wrist hurts more than the right.  She is tried splints for this problem.  She is going to see the pain clinic on Friday.              ROS: All systems reviewed are negative as they relate to the chief complaint within the history of present illness.  Patient denies  fevers or chills.   Assessment & Plan: Visit Diagnoses:  1. Chronic bilateral low back pain, with sciatica presence unspecified   2. Chronic bilateral low back pain with left-sided sciatica   3. Carpal tunnel syndrome, left upper limb     Plan: Impression is fairly normal-appearing cervical spine MRI.  No nerve compression present.  Both hands have severe carpal tunnel syndrome.  This looks like it is a surgical problem after failure of conservative management.  Low back has also evidence of facet arthritis which may be symptomatic.  Patient could benefit from injections for this region.  She has had courses of therapy in the past along with multiple medical treatments.  The risk benefits of carpal tunnel release are discussed including but not limited to infection nerve vessel damage incomplete pain relief particularly in her case where her compression is severe.  Patient understands the risk benefits and wishes to proceed.  Anticipate minimum of 3 to 4 weeks  between surgeries depending on the response of the left wrist to surgery.  Follow-Up Instructions: No follow-ups on file.   Orders:  Orders Placed This Encounter  Procedures  . XR Lumbar Spine 2-3 Views   No orders of the defined types were placed in this encounter.     Procedures: No procedures performed   Clinical Data: No additional findings.  Objective: Vital Signs: There were no vitals taken for this visit.  Physical Exam:   Constitutional: Patient appears well-developed HEENT:  Head: Normocephalic Eyes:EOM are normal Neck: Normal range of motion Cardiovascular: Normal rate Pulmonary/chest: Effort normal Neurologic: Patient is alert Skin: Skin is warm Psychiatric: Patient has normal mood and affect    Ortho Exam: Ortho exam demonstrates mild abductor pollicis brevis wasting with palpable radial pulses.  She also has evidence of arthritis in the DIP joints particularly in the left hand.  Wrist range of motion otherwise intact.  Negative Tinel's cubital tunnel in the elbow.  Neck range of motion reasonable.  Patient does have positive nerve root tension signs on the left negative on the right.  No other masses lymph adenopathy or skin changes noted in that back region.  Ankle dorsiflexion plantarflexion quad hamstring strength is intact with no groin pain with internal and external rotation of the leg.  Specialty Comments:  No specialty comments available.  Imaging: Xr Lumbar Spine 2-3 Views  Result Date: 10/05/2017 AP lateral lumbar spine reviewed.  L4-5 and L5-S1 facet arthropathy is present.  Fairly minimal degenerative disc disease is noted.  No spondylolisthesis.  Visualized hips normal.    PMFS History: Patient Active Problem List   Diagnosis Date Noted  . Degenerative disc disease, lumbar 08/17/2017  . Hyperlipidemia 08/17/2017  . Left sided numbness 12/01/2013  . Acute sinusitis, unspecified 12/08/2012  . Muscle spasm of right leg 12/08/2012  .  Acute upper respiratory infections of unspecified site 12/08/2012  . Major depressive disorder, recurrent episode, severe, without mention of psychotic behavior 12/07/2012  . Borderline personality disorder (HCC) 10/19/2012  . Acute posttraumatic stress disorder 08/24/2012  . Palpitations 08/16/2012  . Neuropathy 08/16/2012  . Restless leg syndrome 08/16/2012  . Routine gynecological examination 07/31/2012  . Neurogenic pain 04/12/2012  . Anxiety 11/03/2011  . Vitamin D deficiency 08/08/2011  . Chronic back pain 05/29/2011  . MVP (mitral valve prolapse) 05/29/2011  . Asthma 05/29/2011  . Tobacco user 05/29/2011  . Chronic mastitis 05/29/2011  . Allergic rhinitis 05/29/2011   Past Medical History:  Diagnosis Date  . Anxiety   . Asthma   . Back pain, chronic   . Depression   . Epilepsy (HCC)   . Fatigue   . Mitral valve prolapse   . Pericarditis   . PTSD (post-traumatic stress disorder)   . Stenosis of lumbosacral spine    numbness L hand and L leg  . Thrombophlebitis    Age 57    Family History  Problem Relation Age of Onset  . Diabetes Mother   . Mitral valve prolapse Mother   . Asthma Mother   . CAD Mother 34  . Anxiety disorder Mother   . Mitral valve prolapse Sister   . Asthma Sister   . Mitral valve prolapse Son   . Alcohol abuse Son   . Diabetes Maternal Uncle   . Diabetes Maternal Grandmother   . Mitral valve prolapse Sister   . CAD Son 83       No details available  . Alcohol abuse Father   . Alcohol abuse Brother     Past Surgical History:  Procedure Laterality Date  . APPENDECTOMY    . BIOPSY BREAST     Right breast x 10  . BREAST LUMPECTOMY     Right breast  . CESAREAN SECTION     x 2  . NOSE SURGERY     s/p trauma  . OVARY SURGERY    . RHINOPLASTY    . UTERINE FIBROID SURGERY     Social History   Occupational History  . Not on file  Tobacco Use  . Smoking status: Current Every Day Smoker    Packs/day: 0.50    Years: 12.00    Pack  years: 6.00    Types: Cigarettes  . Smokeless tobacco: Never Used  Substance and Sexual Activity  . Alcohol use: No    Alcohol/week: 0.0 standard drinks  . Drug use: No  . Sexual activity: Not Currently    Birth control/protection: None

## 2017-10-06 ENCOUNTER — Ambulatory Visit: Payer: Self-pay | Attending: Family Medicine | Admitting: Physician Assistant

## 2017-10-06 VITALS — BP 107/69 | HR 76 | Temp 98.4°F | Resp 18 | Ht 62.0 in | Wt 186.0 lb

## 2017-10-06 DIAGNOSIS — J45909 Unspecified asthma, uncomplicated: Secondary | ICD-10-CM | POA: Insufficient documentation

## 2017-10-06 DIAGNOSIS — J069 Acute upper respiratory infection, unspecified: Secondary | ICD-10-CM | POA: Insufficient documentation

## 2017-10-06 DIAGNOSIS — R05 Cough: Secondary | ICD-10-CM | POA: Insufficient documentation

## 2017-10-06 DIAGNOSIS — F17200 Nicotine dependence, unspecified, uncomplicated: Secondary | ICD-10-CM

## 2017-10-06 DIAGNOSIS — M549 Dorsalgia, unspecified: Secondary | ICD-10-CM | POA: Insufficient documentation

## 2017-10-06 DIAGNOSIS — Z880 Allergy status to penicillin: Secondary | ICD-10-CM | POA: Insufficient documentation

## 2017-10-06 DIAGNOSIS — F329 Major depressive disorder, single episode, unspecified: Secondary | ICD-10-CM | POA: Insufficient documentation

## 2017-10-06 DIAGNOSIS — I341 Nonrheumatic mitral (valve) prolapse: Secondary | ICD-10-CM | POA: Insufficient documentation

## 2017-10-06 DIAGNOSIS — Z79899 Other long term (current) drug therapy: Secondary | ICD-10-CM | POA: Insufficient documentation

## 2017-10-06 DIAGNOSIS — Z7951 Long term (current) use of inhaled steroids: Secondary | ICD-10-CM | POA: Insufficient documentation

## 2017-10-06 DIAGNOSIS — I10 Essential (primary) hypertension: Secondary | ICD-10-CM | POA: Insufficient documentation

## 2017-10-06 DIAGNOSIS — J181 Lobar pneumonia, unspecified organism: Secondary | ICD-10-CM

## 2017-10-06 DIAGNOSIS — R062 Wheezing: Secondary | ICD-10-CM

## 2017-10-06 DIAGNOSIS — F4310 Post-traumatic stress disorder, unspecified: Secondary | ICD-10-CM | POA: Insufficient documentation

## 2017-10-06 DIAGNOSIS — G40909 Epilepsy, unspecified, not intractable, without status epilepticus: Secondary | ICD-10-CM | POA: Insufficient documentation

## 2017-10-06 DIAGNOSIS — J189 Pneumonia, unspecified organism: Secondary | ICD-10-CM | POA: Insufficient documentation

## 2017-10-06 DIAGNOSIS — G8929 Other chronic pain: Secondary | ICD-10-CM | POA: Insufficient documentation

## 2017-10-06 MED ORDER — IPRATROPIUM-ALBUTEROL 0.5-2.5 (3) MG/3ML IN SOLN
3.0000 mL | Freq: Once | RESPIRATORY_TRACT | Status: AC
  Administered 2017-10-06: 3 mL via RESPIRATORY_TRACT

## 2017-10-06 MED ORDER — ALBUTEROL SULFATE HFA 108 (90 BASE) MCG/ACT IN AERS: 2.0000 | INHALATION_SPRAY | Freq: Four times a day (QID) | RESPIRATORY_TRACT | 1 refills | Status: DC | PRN

## 2017-10-06 MED ORDER — AZITHROMYCIN 250 MG PO TABS: ORAL_TABLET | ORAL | 0 refills | Status: DC

## 2017-10-06 MED ORDER — METHYLPREDNISOLONE SODIUM SUCC 125 MG IJ SOLR
125.0000 mg | Freq: Once | INTRAMUSCULAR | Status: AC
  Administered 2017-10-06: 125 mg via INTRAMUSCULAR

## 2017-10-06 MED ORDER — FLUCONAZOLE 150 MG PO TABS: 150.0000 mg | ORAL_TABLET | Freq: Once | ORAL | 0 refills | Status: AC

## 2017-10-06 MED ORDER — FLUTICASONE-SALMETEROL 250-50 MCG/DOSE IN AEPB: 1.0000 | INHALATION_SPRAY | Freq: Two times a day (BID) | RESPIRATORY_TRACT | 0 refills | Status: DC

## 2017-10-06 MED ORDER — BENZONATATE 100 MG PO CAPS: 200.0000 mg | ORAL_CAPSULE | Freq: Three times a day (TID) | ORAL | 0 refills | Status: DC | PRN

## 2017-10-06 MED FILL — AZITHROMYCIN 250 MG TABLET: 250 | 5 days supply | Qty: 6 | Fill #0

## 2017-10-06 MED FILL — FLUCONAZOLE 150 MG TABS: 150 | 1 days supply | Qty: 1 | Fill #0

## 2017-10-06 MED FILL — BENZONATATE 100 MG CAP: 100 | 6 days supply | Qty: 40 | Fill #0

## 2017-10-06 MED FILL — !ADVAIR 250/50 DISKUS: 250-50 | 30 days supply | Qty: 60 | Fill #0

## 2017-10-06 MED FILL — !VENTOLIN HFA INHALER: 108 (90 BAS | 25 days supply | Qty: 18 | Fill #0

## 2017-10-06 NOTE — Patient Instructions (Signed)
Steps to Quit Smoking Smoking tobacco can be bad for your health. It can also affect almost every organ in your body. Smoking puts you and people around you at risk for many serious long-lasting (chronic) diseases. Quitting smoking is hard, but it is one of the best things that you can do for your health. It is never too late to quit. What are the benefits of quitting smoking? When you quit smoking, you lower your risk for getting serious diseases and conditions. They can include:  Lung cancer or lung disease.  Heart disease.  Stroke.  Heart attack.  Not being able to have children (infertility).  Weak bones (osteoporosis) and broken bones (fractures).  If you have coughing, wheezing, and shortness of breath, those symptoms may get better when you quit. You may also get sick less often. If you are pregnant, quitting smoking can help to lower your chances of having a baby of low birth weight. What can I do to help me quit smoking? Talk with your doctor about what can help you quit smoking. Some things you can do (strategies) include:  Quitting smoking totally, instead of slowly cutting back how much you smoke over a period of time.  Going to in-person counseling. You are more likely to quit if you go to many counseling sessions.  Using resources and support systems, such as: ? Online chats with a counselor. ? Phone quitlines. ? Printed self-help materials. ? Support groups or group counseling. ? Text messaging programs. ? Mobile phone apps or applications.  Taking medicines. Some of these medicines may have nicotine in them. If you are pregnant or breastfeeding, do not take any medicines to quit smoking unless your doctor says it is okay. Talk with your doctor about counseling or other things that can help you.  Talk with your doctor about using more than one strategy at the same time, such as taking medicines while you are also going to in-person counseling. This can help make  quitting easier. What things can I do to make it easier to quit? Quitting smoking might feel very hard at first, but there is a lot that you can do to make it easier. Take these steps:  Talk to your family and friends. Ask them to support and encourage you.  Call phone quitlines, reach out to support groups, or work with a counselor.  Ask people who smoke to not smoke around you.  Avoid places that make you want (trigger) to smoke, such as: ? Bars. ? Parties. ? Smoke-break areas at work.  Spend time with people who do not smoke.  Lower the stress in your life. Stress can make you want to smoke. Try these things to help your stress: ? Getting regular exercise. ? Deep-breathing exercises. ? Yoga. ? Meditating. ? Doing a body scan. To do this, close your eyes, focus on one area of your body at a time from head to toe, and notice which parts of your body are tense. Try to relax the muscles in those areas.  Download or buy apps on your mobile phone or tablet that can help you stick to your quit plan. There are many free apps, such as QuitGuide from the CDC (Centers for Disease Control and Prevention). You can find more support from smokefree.gov and other websites.  This information is not intended to replace advice given to you by your health care provider. Make sure you discuss any questions you have with your health care provider. Document Released: 11/21/2008 Document   Revised: 09/23/2015 Document Reviewed: 06/11/2014 Elsevier Interactive Patient Education  2018 [employer] Community-Acquired Pneumonia, Adult Pneumonia is an infection of the lungs. One type of pneumonia can happen while a person is in a hospital. A different type can happen when a person is not in a hospital (community-acquired pneumonia). It is easy for this kind to spread from person to person. It can spread to you if you breathe near an infected person who coughs or sneezes. Some symptoms include:  A dry  cough.  A wet (productive) cough.  Fever.  Sweating.  Chest pain.  Follow these instructions at home:  Take over-the-counter and prescription medicines only as told by your doctor. ? Only take cough medicine if you are losing sleep. ? If you were prescribed an antibiotic medicine, take it as told by your doctor. Do not stop taking the antibiotic even if you start to feel better.  Sleep with your head and neck raised (elevated). You can do this by putting a few pillows under your head, or you can sleep in a recliner.  Do not use tobacco products. These include cigarettes, chewing tobacco, and e-cigarettes. If you need help quitting, ask your doctor.  Drink enough water to keep your pee (urine) clear or pale yellow. A shot (vaccine) can help prevent pneumonia. Shots are often suggested for:  People older than 57 years of age.  People older than 57 years of age: ? Who are having cancer treatment. ? Who have long-term (chronic) lung disease. ? Who have problems with their body's defense system (immune system).  You may also prevent pneumonia if you take these actions:  Get the flu (influenza) shot every year.  Go to the dentist as often as told.  Wash your hands often. If soap and water are not available, use hand sanitizer.  Contact a doctor if:  You have a fever.  You lose sleep because your cough medicine does not help. Get help right away if:  You are short of breath and it gets worse.  You have more chest pain.  Your sickness gets worse. This is very serious if: ? You are an older adult. ? Your body's defense system is weak.  You cough up blood. This information is not intended to replace advice given to you by your health care provider. Make sure you discuss any questions you have with your health care provider. Document Released: 07/14/2007 Document Revised: 07/03/2015 Document Reviewed: 05/22/2014 Elsevier Interactive Patient Education  [employer]2018 Elsevier  Inc.

## 2017-10-06 NOTE — Progress Notes (Signed)
Patient ID: Sylvia DesanctisBarbara Hall, female   DOB: 02/07/1961, 57 y.o.   MRN: [id number]   Sylvia DesanctisBarbara Hall, is a 57 y.o. female  [id number]  [id number]  DOB - 03/01/1960  Subjective:  Chief Complaint and HPI: Sylvia Hall is a 57 y.o. female here today for cough and URI for 5-6 days that worsened a couple days ago.  Fever 102.1 last 2 days.  Cough is harsh, green/yellow mucus.  +nasal congestion.  Ibuprofen helps with fever.  Robitussin not helping much with cough.    Compliant with meds.  No HA/CP/Dizziness  Social: lives with BF of 7 years.     ROS:   Constitutional:  No f/c, No night sweats, No unexplained weight loss. EENT:  No vision changes, No blurry vision, No hearing changes. No additional mouth, throat, or ear problems.  Respiratory: + cough, + SOB + wheezing Cardiac: No CP, no palpitations GI:  No abd pain, No N/V/D. GU: No Urinary s/sx Musculoskeletal: No joint pain Neuro: No headache, no dizziness, no motor weakness.  Skin: No rash Endocrine:  No polydipsia. No polyuria.  Psych: Denies SI/HI  No problems updated.  ALLERGIES: Allergies  Allergen Reactions  . Phenytoin Sodium Extended Other (See Comments)    seizures  . Caffeine Other (See Comments)    Heart races  . Dilantin [Phenytoin Sodium Extended] Other (See Comments)    Seizure   . Food Other (See Comments)    Sodium nitrates-headaches   . Ivp Dye [Iodinated Diagnostic Agents] Hives  . Levofloxacin Hives  . Sulfa Drugs Cross Reactors Hives  . Aspirin Other (See Comments)    Upset stomach, resolves with GERD meds   . Penicillins Rash    Pt called in stating she  Was prescribed Pen VK in ED and developed a generalized raised rash.  She has taken amoxicillin and augmentin since this time and not had any problem  . Pregabalin Rash  . Sodium Nitrate Other (See Comments)    headaches    PAST MEDICAL HISTORY: Past Medical History:  Diagnosis Date  . Anxiety   . Asthma   . Back pain, chronic   .  Depression   . Epilepsy (HCC)   . Fatigue   . Mitral valve prolapse   . Pericarditis   . PTSD (post-traumatic stress disorder)   . Stenosis of lumbosacral spine    numbness L hand and L leg  . Thrombophlebitis    Age 57    MEDICATIONS AT HOME: Prior to Admission medications   Medication Sig Start Date End Date Taking? Authorizing Provider  albuterol (PROVENTIL HFA;VENTOLIN HFA) 108 (90 Base) MCG/ACT inhaler Inhale 2 puffs into the lungs every 6 (six) hours as needed for wheezing or shortness of breath. 10/06/17  Yes McClung, Angela M, PA-C  gabapentin (NEURONTIN) 300 MG capsule Take 2 capsules (600 mg total) by mouth 2 (two) times daily. 08/17/17  Yes Newlin, Odette HornsEnobong, MD  lidocaine (LIDODERM) 5 % Place 1 patch onto the skin daily. Remove & Discard patch within 12 hours or as directed by MD 08/17/17  Yes Hoy RegisterNewlin, Enobong, MD  methocarbamol (ROBAXIN) 500 MG tablet Take 2 tablets (1,000 mg total) by mouth 2 (two) times daily as needed for muscle spasms. 08/17/17  Yes Hoy RegisterNewlin, Enobong, MD  metoprolol succinate (TOPROL-XL) 50 MG 24 hr tablet Take 1 tablet (50 mg total) by mouth daily. Take with or immediately following a meal. 04/04/17  Yes Hochrein, Fayrene FearingJames, MD  rOPINIRole (REQUIP XL) 2 MG 24 hr tablet Take  1 tablet (2 mg total) by mouth at bedtime. 08/17/17  Yes Hoy Register, MD  valACYclovir (VALTREX) 1000 MG tablet Take 1 tablet (1,000 mg total) by mouth 3 (three) times daily. 06/15/17  Yes Anders Simmonds, PA-C  Vitamin D, Ergocalciferol, (DRISDOL) 50000 units CAPS capsule Take 1 capsule (50,000 Units total) by mouth every 7 (seven) days. 06/20/17  Yes Anders Simmonds, PA-C  azithromycin (ZITHROMAX) 250 MG tablet Take 2 today then 1 daily 10/06/17   Anders Simmonds, PA-C  fluconazole (DIFLUCAN) 150 MG tablet Take 1 tablet (150 mg total) by mouth once for 1 dose. If needed for yeast infection after antibiotics. 10/06/17 10/06/17  Anders Simmonds, PA-C  Fluticasone-Salmeterol (ADVAIR) 250-50  MCG/DOSE AEPB Inhale 1 puff into the lungs 2 (two) times daily. 10/06/17   Anders Simmonds, PA-C  metoprolol tartrate (LOPRESSOR) 25 MG tablet Take 0.5 tablets (12.5 mg total) by mouth as needed. For palpitations 04/04/17 07/03/17  Rollene Rotunda, MD     Objective:  EXAM:   Vitals:   10/06/17 1115  BP: 107/69  Pulse: 76  Resp: 18  Temp: 98.4 F (36.9 C)  TempSrc: Oral  SpO2: 100%  Weight: 186 lb (84.4 kg)  Height: 5\' 2"  (1.575 m)    General appearance : A&OX3. NAD. Non-toxic-appearing HEENT: Atraumatic and Normocephalic.  PERRLA. EOM intact.  TM full B. Mouth-MMM, post pharynx WNL w/o erythema, + PND. Neck: supple, no JVD. No cervical lymphadenopathy. No thyromegaly Chest/Lungs:  Breathing-non-labored, fair air entry bilaterally, breath sounds with moderate wheezing throughout and rales R base.  No rhonchi.   CVS: S1 S2 regular, no murmurs, gallops, rubs  Extremities: Bilateral Lower Ext shows no edema, both legs are warm to touch with = pulse throughout Neurology:  CN II-XII grossly intact, Non focal.   Psych:  TP linear. J/I WNL. Normal speech. Appropriate eye contact and affect.  Skin:  No Rash  Data Review Lab Results  Component Value Date   HGBA1C 5.1 12/01/2013   HGBA1C 5.2 04/12/2012     Assessment & Plan   1. Pneumonia of right lower lobe due to infectious organism (HCC) Fluids, rest, respiratory care.   - azithromycin (ZITHROMAX) 250 MG tablet; Take 2 today then 1 daily  Dispense: 6 tablet; Refill: 0 - fluconazole (DIFLUCAN) 150 MG tablet; Take 1 tablet (150 mg total) by mouth once for 1 dose. If needed for yeast infection after antibiotics.  Dispense: 1 tablet; Refill: 0  2. Wheezing - methylPREDNISolone sodium succinate (SOLU-MEDROL) 125 mg/2 mL injection 125 mg - ipratropium-albuterol (DUONEB) 0.5-2.5 (3) MG/3ML nebulizer solution 3 mL - albuterol (PROVENTIL HFA;VENTOLIN HFA) 108 (90 Base) MCG/ACT inhaler; Inhale 2 puffs into the lungs every 6 (six) hours  as needed for wheezing or shortness of breath.  Dispense: 1 Inhaler; Refill: 1 - Fluticasone-Salmeterol (ADVAIR) 250-50 MCG/DOSE AEPB; Inhale 1 puff into the lungs 2 (two) times daily.  Dispense: 60 each; Refill: 0  3.  htn-controlled-continue current regimen  4.  Smoker- cessation advised and information on quitting given.     Patient have been counseled extensively about nutrition and exercise  Return for keep 10/9 appt with Dr Alvis Lemmings.  The patient was given clear instructions to go to ER or return to medical center if symptoms don't improve, worsen or new problems develop. The patient verbalized understanding. The patient was told to call to get lab results if they haven't heard anything in the next week.     Sylvia Co, PA-C [place]  [facility] [place], [place] [phone]   10/06/2017, 11:37 AM

## 2017-10-07 ENCOUNTER — Encounter
Payer: No Typology Code available for payment source | Attending: Physical Medicine & Rehabilitation | Admitting: Physical Medicine & Rehabilitation

## 2017-10-07 ENCOUNTER — Encounter: Payer: Self-pay | Admitting: Physical Medicine & Rehabilitation

## 2017-10-07 VITALS — BP 110/72 | HR 89 | Ht 63.25 in | Wt 183.0 lb

## 2017-10-07 DIAGNOSIS — M791 Myalgia, unspecified site: Secondary | ICD-10-CM

## 2017-10-07 DIAGNOSIS — G894 Chronic pain syndrome: Secondary | ICD-10-CM

## 2017-10-07 DIAGNOSIS — Z8249 Family history of ischemic heart disease and other diseases of the circulatory system: Secondary | ICD-10-CM | POA: Insufficient documentation

## 2017-10-07 DIAGNOSIS — F329 Major depressive disorder, single episode, unspecified: Secondary | ICD-10-CM | POA: Insufficient documentation

## 2017-10-07 DIAGNOSIS — F4310 Post-traumatic stress disorder, unspecified: Secondary | ICD-10-CM | POA: Insufficient documentation

## 2017-10-07 DIAGNOSIS — M542 Cervicalgia: Secondary | ICD-10-CM | POA: Insufficient documentation

## 2017-10-07 DIAGNOSIS — J45909 Unspecified asthma, uncomplicated: Secondary | ICD-10-CM | POA: Insufficient documentation

## 2017-10-07 DIAGNOSIS — Z6832 Body mass index (BMI) 32.0-32.9, adult: Secondary | ICD-10-CM | POA: Insufficient documentation

## 2017-10-07 DIAGNOSIS — Z825 Family history of asthma and other chronic lower respiratory diseases: Secondary | ICD-10-CM | POA: Insufficient documentation

## 2017-10-07 DIAGNOSIS — G479 Sleep disorder, unspecified: Secondary | ICD-10-CM

## 2017-10-07 DIAGNOSIS — M51369 Other intervertebral disc degeneration, lumbar region without mention of lumbar back pain or lower extremity pain: Secondary | ICD-10-CM

## 2017-10-07 DIAGNOSIS — Z833 Family history of diabetes mellitus: Secondary | ICD-10-CM | POA: Insufficient documentation

## 2017-10-07 DIAGNOSIS — G5603 Carpal tunnel syndrome, bilateral upper limbs: Secondary | ICD-10-CM

## 2017-10-07 DIAGNOSIS — Z79899 Other long term (current) drug therapy: Secondary | ICD-10-CM | POA: Insufficient documentation

## 2017-10-07 DIAGNOSIS — E669 Obesity, unspecified: Secondary | ICD-10-CM | POA: Insufficient documentation

## 2017-10-07 DIAGNOSIS — Z720 Tobacco use: Secondary | ICD-10-CM

## 2017-10-07 DIAGNOSIS — G40909 Epilepsy, unspecified, not intractable, without status epilepticus: Secondary | ICD-10-CM | POA: Insufficient documentation

## 2017-10-07 DIAGNOSIS — M545 Low back pain: Secondary | ICD-10-CM | POA: Insufficient documentation

## 2017-10-07 DIAGNOSIS — M792 Neuralgia and neuritis, unspecified: Secondary | ICD-10-CM

## 2017-10-07 DIAGNOSIS — M5136 Other intervertebral disc degeneration, lumbar region: Secondary | ICD-10-CM

## 2017-10-07 DIAGNOSIS — E559 Vitamin D deficiency, unspecified: Secondary | ICD-10-CM

## 2017-10-07 DIAGNOSIS — F17210 Nicotine dependence, cigarettes, uncomplicated: Secondary | ICD-10-CM | POA: Insufficient documentation

## 2017-10-07 DIAGNOSIS — I341 Nonrheumatic mitral (valve) prolapse: Secondary | ICD-10-CM | POA: Insufficient documentation

## 2017-10-07 DIAGNOSIS — F332 Major depressive disorder, recurrent severe without psychotic features: Secondary | ICD-10-CM

## 2017-10-07 DIAGNOSIS — F419 Anxiety disorder, unspecified: Secondary | ICD-10-CM

## 2017-10-07 DIAGNOSIS — M48061 Spinal stenosis, lumbar region without neurogenic claudication: Secondary | ICD-10-CM | POA: Insufficient documentation

## 2017-10-07 DIAGNOSIS — Z818 Family history of other mental and behavioral disorders: Secondary | ICD-10-CM | POA: Insufficient documentation

## 2017-10-07 DIAGNOSIS — Z8672 Personal history of thrombophlebitis: Secondary | ICD-10-CM | POA: Insufficient documentation

## 2017-10-07 MED ORDER — GABAPENTIN 600 MG PO TABS: 900.0000 mg | ORAL_TABLET | Freq: Three times a day (TID) | ORAL | 1 refills | Status: DC

## 2017-10-07 MED ORDER — DULOXETINE HCL 30 MG PO CPEP: 30.0000 mg | ORAL_CAPSULE | Freq: Every day | ORAL | 1 refills | Status: DC

## 2017-10-07 MED ORDER — METHOCARBAMOL 500 MG PO TABS: 1000.0000 mg | ORAL_TABLET | Freq: Three times a day (TID) | ORAL | 1 refills | Status: DC | PRN

## 2017-10-07 MED ORDER — MELOXICAM 15 MG PO TABS: 15.0000 mg | ORAL_TABLET | Freq: Every day | ORAL | 1 refills | Status: DC

## 2017-10-07 MED FILL — GABAPENTIN 600 MG TABLET: 600 | 30 days supply | Qty: 135 | Fill #0

## 2017-10-07 MED FILL — MELOXICAM 15 MG TABLET: 15 | 30 days supply | Qty: 30 | Fill #0

## 2017-10-07 MED FILL — ?DULOXETINE HCL 30 MG CPEP: 30 | 30 days supply | Qty: 30 | Fill #0

## 2017-10-07 MED FILL — METHOCARBAMOL 500 MG TABS: 500 | 30 days supply | Qty: 180 | Fill #0

## 2017-10-07 NOTE — Progress Notes (Signed)
Subjective:    Patient ID: Sylvia DesanctisBarbara Hall, female    DOB: 02/23/1960, 57 y.o.   MRN: [id number]  HPI 57 y/o female with pmh PTSD, lumbar stenosis, depression/anxiety present with generalized pain > left neck.  Started 2012.  Denies inciting event.  Denies alleviating factors.  Moving exacerbates the pain.  All qualities of pain.  Radiating from hand proximally.  Constant.  Gabapentin, Robaxin, Ibu, Lidocaine patch without benefit.  Associated numbness and weakness.  Pain limits any activity. Denies falls.  Pt recently saw Ortho, notes reviewed, NCS/EMG showing b/l carpal tunnel and lumbar facet arthropathy.   Pain Inventory Average Pain 8 Pain Right Now 9 My pain is constant, sharp, burning, stabbing, tingling and aching  In the last 24 hours, has pain interfered with the following? General activity 8 Relation with others 8 Enjoyment of life 10 What TIME of day is your pain at its worst? all Sleep (in general) Poor  Pain is worse with: walking, bending, sitting, inactivity, standing and some activites Pain improves with: rocking Relief from Meds: 0  Mobility walk without assistance ability to climb steps?  yes do you drive?  yes  Function not employed: date last employed . I need assistance with the following:  dressing, meal prep, household duties and shopping  Neuro/Psych weakness numbness tingling  Prior Studies Any changes since last visit?  no  Physicians involved in your care Any changes since last visit?  no   Family History  Problem Relation Age of Onset  . Diabetes Mother   . Mitral valve prolapse Mother   . Asthma Mother   . CAD Mother [id number]  . Anxiety disorder Mother   . Mitral valve prolapse Sister   . Asthma Sister   . Mitral valve prolapse Son   . Alcohol abuse Son   . Diabetes Maternal Uncle   . Diabetes Maternal Grandmother   . Mitral valve prolapse Sister   . CAD Son [id number]       No details available  . Alcohol abuse Father   . Alcohol abuse  Brother    Social History   Socioeconomic History  . Marital status: Single    Spouse name: Not on file  . Number of children: 2  . Years of education: Not on file  . Highest education level: Not on file  Occupational History  . Not on file  Social Needs  . Financial resource strain: Not on file  . Food insecurity:    Worry: Not on file    Inability: Not on file  . Transportation needs:    Medical: Not on file    Non-medical: Not on file  Tobacco Use  . Smoking status: Current Every Day Smoker    Packs/day: 1.00    Years: 12.00    Pack years: 12.00    Types: Cigarettes  . Smokeless tobacco: Never Used  Substance and Sexual Activity  . Alcohol use: No    Alcohol/week: 0.0 standard drinks  . Drug use: No  . Sexual activity: Not Currently    Birth control/protection: None  Lifestyle  . Physical activity:    Days per week: Not on file    Minutes per session: Not on file  . Stress: Not on file  Relationships  . Social connections:    Talks on phone: Not on file    Gets together: Not on file    Attends religious service: Not on file    Active member of club or organization: Not  on file    Attends meetings of clubs or organizations: Not on file    Relationship status: Not on file  Other Topics Concern  . Not on file  Social History Narrative   Lives alone.     Past Surgical History:  Procedure Laterality Date  . APPENDECTOMY    . BIOPSY BREAST     Right breast x 10  . BREAST LUMPECTOMY     Right breast  . CESAREAN SECTION     x 2  . NOSE SURGERY     s/p trauma  . OVARY SURGERY    . RHINOPLASTY    . UTERINE FIBROID SURGERY     Past Medical History:  Diagnosis Date  . Anxiety   . Asthma   . Back pain, chronic   . Depression   . Epilepsy (HCC)   . Fatigue   . Mitral valve prolapse   . Pericarditis   . PTSD (post-traumatic stress disorder)   . Stenosis of lumbosacral spine    numbness L hand and L leg  . Thrombophlebitis    Age 50   Ht 5' 3.25"  (1.607 m)   Wt 183 lb (83 kg)   BMI 32.16 kg/m   Opioid Risk Score:   Fall Risk Score:  `1  Depression screen PHQ 2/9  Depression screen [facility] 2/9 08/17/2017 06/15/2017 05/18/2017 04/07/2017 02/16/2017 01/12/2017 07/24/2012  Decreased Interest 0 0 0 0 0 0 0  Down, Depressed, Hopeless 0 0 0 0 0 0 0  PHQ - 2 Score 0 0 0 0 0 0 0  Altered sleeping 3 0 0 0 0 3 -  Tired, decreased energy 1 0 0 0 0 0 -  Change in appetite 0 0 0 0 0 0 -  Feeling bad or failure about yourself  0 0 0 0 0 0 -  Trouble concentrating 0 0 0 0 0 0 -  Moving slowly or fidgety/restless 0 0 0 0 0 0 -  Suicidal thoughts 0 0 0 0 0 0 -  PHQ-9 Score 4 0 0 0 0 3 -     Review of Systems  Constitutional: Positive for chills and fever.  HENT: Negative.   Eyes: Negative.   Respiratory: Positive for cough, shortness of breath and wheezing.   Cardiovascular: Negative.   Gastrointestinal: Negative.   Endocrine: Negative.   Genitourinary: Negative.   Musculoskeletal: Positive for arthralgias, back pain, gait problem, myalgias, neck pain and neck stiffness.  Skin: Negative.   Allergic/Immunologic: Negative.   Neurological: Positive for tremors, weakness and numbness.  Hematological: Negative.   Psychiatric/Behavioral: Negative.   All other systems reviewed and are negative.     Objective:   Physical Exam Gen: NAD. Vital signs reviewed HENT: Normocephalic, Atraumatic Eyes: EOMI. No discharge.  Cardio: RRR. No JVD. Pulm: B/l clear to auscultation.  Effort normal Abd: Soft, BS+ MSK:  Gait antalgic.   TTP diffusely neck.    No edema.   Limited ROM in b/l UE and neck Neuro: CN II-XII grossly intact.    Sensation diminished to light touch b/l hands  Strength  4-/5 in all UE myotomes (pain inhibition)  Neg [place] b/l Skin: Warm and Dry. Intact    Assessment & Plan:  57 y/o female with pmh PTSD, lumbar stenosis, depression/anxiety present with generalized pain > left neck.   1. Generalized pain, >Neck  MRI C-spine  from 09/2017 reviewed, showing mild facet arthropathy  Labs reviewed  Referral information reviewed  [facility]  reviewed  No benefit with Heat/Cold  Will order TENS IT  Cont Lidoderm  Will increase Gabapentin to 900 TID  Will order Cymbalta 30mg  daily with food  Will increase Robaxin to 1000mg  TID, will consider Baclofen  Will order Mobic 15mg  with food, d/c Ibu  Will consider referral to Psychology  Will coonsider PT when able to tolerate   2. Sleep disturbance  See #1  Will consider Elavil  4. Obesity  Will consider referral to dietitian  5. Myalgia   Will consider trigger point injections when able to tolerate  6. B/l CTS  Per NCS/EMG, reviewed  Plan for surgery next month  Cont brace  7. Low back pain  ?Plan for injections per Ortho  8. Vit D deficiency  Cont supplement  9. Tobacco abuse  Currently 1PPD  States she is trying to quit

## 2017-10-11 ENCOUNTER — Other Ambulatory Visit: Payer: Self-pay | Admitting: Physician Assistant

## 2017-10-11 ENCOUNTER — Other Ambulatory Visit (INDEPENDENT_AMBULATORY_CARE_PROVIDER_SITE_OTHER): Payer: Self-pay | Admitting: Orthopedic Surgery

## 2017-10-11 DIAGNOSIS — J181 Lobar pneumonia, unspecified organism: Principal | ICD-10-CM

## 2017-10-11 DIAGNOSIS — J189 Pneumonia, unspecified organism: Secondary | ICD-10-CM

## 2017-10-11 DIAGNOSIS — G5602 Carpal tunnel syndrome, left upper limb: Secondary | ICD-10-CM

## 2017-10-13 MED FILL — METOPROLOL SUCCINATE ER 50: 50 | 30 days supply | Qty: 30 | Fill #6

## 2017-10-13 MED FILL — rOPINIRole HCL ER 2 MG TB24: 2 | 30 days supply | Qty: 30 | Fill #2

## 2017-10-16 ENCOUNTER — Encounter (HOSPITAL_COMMUNITY): Payer: Self-pay | Admitting: *Deleted

## 2017-10-16 ENCOUNTER — Emergency Department (HOSPITAL_COMMUNITY)
Admission: EM | Admit: 2017-10-16 | Discharge: 2017-10-16 | Disposition: A | Discharge status: Home / Self Care | Payer: Self-pay | Attending: Emergency Medicine | Admitting: Emergency Medicine

## 2017-10-16 ENCOUNTER — Emergency Department (HOSPITAL_COMMUNITY): Payer: Self-pay

## 2017-10-16 ENCOUNTER — Other Ambulatory Visit: Payer: Self-pay

## 2017-10-16 DIAGNOSIS — Z79899 Other long term (current) drug therapy: Secondary | ICD-10-CM | POA: Insufficient documentation

## 2017-10-16 DIAGNOSIS — J45909 Unspecified asthma, uncomplicated: Secondary | ICD-10-CM | POA: Insufficient documentation

## 2017-10-16 DIAGNOSIS — M545 Low back pain: Secondary | ICD-10-CM | POA: Insufficient documentation

## 2017-10-16 DIAGNOSIS — F17210 Nicotine dependence, cigarettes, uncomplicated: Secondary | ICD-10-CM | POA: Insufficient documentation

## 2017-10-16 DIAGNOSIS — E785 Hyperlipidemia, unspecified: Secondary | ICD-10-CM | POA: Insufficient documentation

## 2017-10-16 MED ORDER — HYDROMORPHONE HCL 1 MG/ML IJ SOLN
0.5000 mg | Freq: Once | INTRAMUSCULAR | Status: AC
  Administered 2017-10-16: 0.5 mg via INTRAVENOUS
  Filled 2017-10-16: qty 1

## 2017-10-16 MED ORDER — KETOROLAC TROMETHAMINE 30 MG/ML IJ SOLN
30.0000 mg | Freq: Once | INTRAMUSCULAR | Status: AC
  Administered 2017-10-16: 30 mg via INTRAVENOUS
  Filled 2017-10-16: qty 1

## 2017-10-16 MED ORDER — DIAZEPAM 5 MG PO TABS
5.0000 mg | ORAL_TABLET | Freq: Once | ORAL | Status: AC
  Administered 2017-10-16: 5 mg via ORAL
  Filled 2017-10-16: qty 1

## 2017-10-16 MED ORDER — OXYCODONE-ACETAMINOPHEN 5-325 MG PO TABS
1.0000 | ORAL_TABLET | Freq: Once | ORAL | Status: AC | PRN
  Administered 2017-10-16: 1 via ORAL
  Filled 2017-10-16: qty 1

## 2017-10-16 MED ORDER — NAPROXEN 500 MG PO TABS: 500.0000 mg | ORAL_TABLET | Freq: Two times a day (BID) | ORAL | 0 refills | Status: DC | PRN

## 2017-10-16 MED ORDER — HYDROCODONE-ACETAMINOPHEN 5-325 MG PO TABS: 1.0000 | ORAL_TABLET | Freq: Four times a day (QID) | ORAL | 0 refills | Status: DC | PRN

## 2017-10-16 NOTE — Discharge Instructions (Signed)
It was my pleasure taking care of you today!   Naproxen as needed for mild to moderate pain.  Your pain medication is only as needed for severe pain -This can make you very drowsy - please do not drink alcohol, operate heavy machinery or drive on this medication.   Please call your orthopedic doctor in the morning to schedule a follow-up appointment.   Return to the ED for worsening back pain, fever, weakness or numbness of either leg, or if you develop either (1) an inability to urinate or have bowel movements, or (2) loss of your ability to control your bathroom functions (if you start having "accidents"), or if you develop other new symptoms that concern you.

## 2017-10-16 NOTE — ED Notes (Signed)
Patient transported to MRI 

## 2017-10-16 NOTE — ED Triage Notes (Signed)
The pt is c/o pain in her lower back after she picked up a 30lb bag of dog food yesterday tonight she has pain in her back and pain down legs and hips  Chronic back pain  She took ibuprofen 2 hours ago  No change in the pain

## 2017-10-16 NOTE — ED Provider Notes (Signed)
[facility] EMERGENCY DEPARTMENT Provider Note   CSN: [id number] Arrival date & time: 10/16/17  0453     History   Chief Complaint Chief Complaint  Patient presents with  . Back Pain    HPI Sylvia Hall is a 57 y.o. female.  The history is provided by the patient and medical records. No language interpreter was used.  Back Pain   Pertinent negatives include no numbness and no weakness.  Sylvia Hall is a 57 y.o. female  with a PMH of lumbar stenosis followed by orthopedics, Dr. August Saucer who presents to the Emergency Department complaining of acute worsening of her chronic back pain which began last night.  She states that she lifted a 30 pound bag of dog food when she suddenly felt a pop in her central low back and immediate onset of pain.  The pain has been constant and radiates around to her groin bilaterally.  She has tried taking ibuprofen with no improvement.  Pain is worse with certain movements, especially trying to move her legs.  She has been unable to ambulate due to the pain. Patient denies new upper back or neck pain. No fever, saddle anesthesia, weakness, numbness, urinary complaints including retention/incontinence. No history of cancer or recent spinal procedures.    Past Medical History:  Diagnosis Date  . Anxiety   . Asthma   . Back pain, chronic   . Depression   . Epilepsy (HCC)   . Fatigue   . Mitral valve prolapse   . Pericarditis   . PTSD (post-traumatic stress disorder)   . Stenosis of lumbosacral spine    numbness L hand and L leg  . Thrombophlebitis    Age 79    Patient Active Problem List   Diagnosis Date Noted  . Chronic pain syndrome 10/07/2017  . Degenerative disc disease, lumbar 08/17/2017  . Hyperlipidemia 08/17/2017  . Left sided numbness 12/01/2013  . Acute sinusitis, unspecified 12/08/2012  . Muscle spasm of right leg 12/08/2012  . Acute upper respiratory infections of unspecified site 12/08/2012  . Severe episode  of recurrent major depressive disorder (HCC) 12/07/2012  . Borderline personality disorder (HCC) 10/19/2012  . Acute posttraumatic stress disorder 08/24/2012  . Palpitations 08/16/2012  . Neuropathy 08/16/2012  . Restless leg syndrome 08/16/2012  . Routine gynecological examination 07/31/2012  . Neurogenic pain 04/12/2012  . Anxiety 11/03/2011  . Vitamin D deficiency 08/08/2011  . Chronic back pain 05/29/2011  . MVP (mitral valve prolapse) 05/29/2011  . Asthma 05/29/2011  . Tobacco user 05/29/2011  . Chronic mastitis 05/29/2011  . Allergic rhinitis 05/29/2011    Past Surgical History:  Procedure Laterality Date  . APPENDECTOMY    . BIOPSY BREAST     Right breast x 10  . BREAST LUMPECTOMY     Right breast  . CESAREAN SECTION     x 2  . NOSE SURGERY     s/p trauma  . OVARY SURGERY    . RHINOPLASTY    . UTERINE FIBROID SURGERY       OB History    Gravida  2   Para  2   Term  0   Preterm  2   AB      Living  2     SAB      TAB      Ectopic      Multiple      Live Births  Home Medications    Prior to Admission medications   Medication Sig Start Date End Date Taking? Authorizing Provider  albuterol (PROVENTIL HFA;VENTOLIN HFA) 108 (90 Base) MCG/ACT inhaler Inhale 2 puffs into the lungs every 6 (six) hours as needed for wheezing or shortness of breath. 10/06/17  Yes McClung, Angela M, PA-C  Fluticasone-Salmeterol (ADVAIR) 250-50 MCG/DOSE AEPB Inhale 1 puff into the lungs 2 (two) times daily. 10/06/17  Yes Anders Simmonds, PA-C  gabapentin (NEURONTIN) 600 MG tablet Take 1.5 tablets (900 mg total) by mouth 3 (three) times daily. 10/07/17 11/06/17 Yes Patel, Maryln Gottron, MD  methocarbamol (ROBAXIN) 500 MG tablet Take 2 tablets (1,000 mg total) by mouth every 8 (eight) hours as needed for muscle spasms. 10/07/17  Yes Marcello Fennel, MD  metoprolol succinate (TOPROL-XL) 50 MG 24 hr tablet Take 1 tablet (50 mg total) by mouth daily. Take with or  immediately following a meal. 04/04/17  Yes Hochrein, Fayrene Fearing, MD  rOPINIRole (REQUIP XL) 2 MG 24 hr tablet Take 1 tablet (2 mg total) by mouth at bedtime. 08/17/17  Yes Hoy Register, MD  benzonatate (TESSALON) 100 MG capsule Take 2 capsules (200 mg total) by mouth 3 (three) times daily as needed for cough. Patient not taking: Reported on 10/13/2017 10/06/17   Anders Simmonds, PA-C  DULoxetine (CYMBALTA) 30 MG capsule Take 1 capsule (30 mg total) by mouth daily. Patient not taking: Reported on 10/13/2017 10/07/17   Marcello Fennel, MD  HYDROcodone-acetaminophen (NORCO) 5-325 MG tablet Take 1 tablet by mouth every 6 (six) hours as needed for moderate pain. 10/16/17   Ward, Chase Picket, PA-C  lidocaine (LIDODERM) 5 % Place 1 patch onto the skin daily. Remove & Discard patch within 12 hours or as directed by MD Patient not taking: Reported on 10/16/2017 08/17/17   Hoy Register, MD  meloxicam (MOBIC) 15 MG tablet Take 1 tablet (15 mg total) by mouth daily. Patient not taking: Reported on 10/13/2017 10/07/17   Marcello Fennel, MD  metoprolol tartrate (LOPRESSOR) 25 MG tablet Take 0.5 tablets (12.5 mg total) by mouth as needed. For palpitations 04/04/17 07/03/17  Rollene Rotunda, MD  naproxen (NAPROSYN) 500 MG tablet Take 1 tablet (500 mg total) by mouth 2 (two) times daily as needed. 10/16/17   Ward, Chase Picket, PA-C  Vitamin D, Ergocalciferol, (DRISDOL) 50000 units CAPS capsule Take 1 capsule (50,000 Units total) by mouth every 7 (seven) days. Patient not taking: Reported on 10/16/2017 06/20/17   Anders Simmonds, PA-C    Family History Family History  Problem Relation Age of Onset  . Diabetes Mother   . Mitral valve prolapse Mother   . Asthma Mother   . CAD Mother 72  . Anxiety disorder Mother   . Mitral valve prolapse Sister   . Asthma Sister   . Mitral valve prolapse Son   . Alcohol abuse Son   . Diabetes Maternal Uncle   . Diabetes Maternal Grandmother   . Mitral valve prolapse Sister   .  CAD Son 62       No details available  . Alcohol abuse Father   . Alcohol abuse Brother     Social History Social History   Tobacco Use  . Smoking status: Current Every Day Smoker    Packs/day: 1.00    Years: 12.00    Pack years: 12.00    Types: Cigarettes  . Smokeless tobacco: Never Used  Substance Use Topics  . Alcohol use: No  Alcohol/week: 0.0 standard drinks  . Drug use: No     Allergies   Phenytoin sodium extended; Caffeine; Cymbalta [duloxetine hcl]; Dilantin [phenytoin sodium extended]; Ivp dye [iodinated diagnostic agents]; Levofloxacin; Sulfa drugs cross reactors; Aspirin; Penicillins; Pregabalin; and Sodium nitrate   Review of Systems Review of Systems  Musculoskeletal: Positive for back pain.  Neurological: Negative for weakness and numbness.  All other systems reviewed and are negative.    Physical Exam Updated Vital Signs BP 92/64   Pulse 66   Temp 98.2 F (36.8 C) (Oral)   Ht 5\' 3"  (1.6 m)   Wt 81.6 kg   SpO2 98%   BMI 31.89 kg/m   Physical Exam  Constitutional: She is oriented to person, place, and time. She appears well-developed and well-nourished.  Cardiovascular: Normal rate, regular rhythm, normal heart sounds and intact distal pulses.  Pulmonary/Chest: Effort normal and breath sounds normal. No respiratory distress.  Abdominal: Soft. Bowel sounds are normal. She exhibits no distension. There is no tenderness.  Musculoskeletal:       Back:  Midline tenderness to palpation as depicted in image. 5/5 muscle strength in bilateral LE's, although range of motion is limited due to pain. Straight leg raises are negative bilaterally for radicular symptoms, however does exacerbate her pain to the low back.  Neurological: She is alert and oriented to person, place, and time. She has normal reflexes.  Bilateral lower extremities neurovascularly intact.  Skin: Skin is warm and dry. No rash noted. No erythema.  Nursing note and vitals  reviewed.    ED Treatments / Results  Labs (all labs ordered are listed, but only abnormal results are displayed) Labs Reviewed - No data to display  EKG None  Radiology Mr Lumbar Spine Wo Contrast  Result Date: 10/16/2017 CLINICAL DATA:  Low back pain after lifting a heavy bag of dog food. Acute on chronic back pain. EXAM: MRI LUMBAR SPINE WITHOUT CONTRAST TECHNIQUE: Multiplanar, multisequence MR imaging of the lumbar spine was performed. No intravenous contrast was administered. COMPARISON:  Lumbar spine radiographs from 10/05/2017 FINDINGS: Segmentation: A transitional lumbosacral vertebra is assumed to represent the S1 level. Careful correlation with this numbering strategy prior to any procedural intervention would be recommended. Alignment:  No vertebral subluxation is observed. Vertebrae: Schmorl's nodes at the L5-S1 level with type 2 degenerative endplate findings. Disc desiccation at all levels between L3 and S2. Conus medullaris and cauda equina: Conus extends to the L1-2 level. Conus and cauda equina appear normal. Paraspinal and other soft tissues: Probable gallstones dependently in the gallbladder on image 5/5. Suspected cyst in segment 4 of the liver. 2.2 by 1.2 cm cystic lesion along the superficial fascia margin of the left psoas muscle, probably cystic lymphangioma. Disc levels: L1-2: Unremarkable. L2-3: Unremarkable. L3-4: No impingement.  Mild disc bulge. L4-5: No impingement. Mild disc bulge and mild right facet arthropathy. L5-S1: Mild left and borderline right subarticular lateral recess stenosis and borderline bilateral foraminal stenosis due to facet arthropathy and disc bulge. S1-2: Mild left foraminal stenosis due to a small left foraminal disc protrusion. IMPRESSION: 1. A transitional lumbosacral vertebra is assumed to represent the S1 level. Careful correlation with this numbering strategy prior to any procedural intervention would be recommended. 2. Mild impingement at  L5-S1 and S1-2 due to spondylosis and degenerative disc disease. 3. Probable cholelithiasis. 4. Small cystic lesion in the left retroperitoneum, probably a cystic lymphangioma. Electronically Signed   By: Gaylyn Rong M.D.   On: 10/16/2017 10:13  Procedures Procedures (including critical care time)  Medications Ordered in ED Medications  HYDROmorphone (DILAUDID) injection 0.5 mg (0.5 mg Intravenous Given 10/16/17 0728)  ketorolac (TORADOL) 30 MG/ML injection 30 mg (30 mg Intravenous Given 10/16/17 0728)  diazepam (VALIUM) tablet 5 mg (5 mg Oral Given 10/16/17 0728)  oxyCODONE-acetaminophen (PERCOCET/ROXICET) 5-325 MG per tablet 1-2 tablet (1 tablet Oral Given 10/16/17 1100)     Initial Impression / Assessment and Plan / ED Course  I have reviewed the triage vital signs and the nursing notes.  Pertinent labs & imaging results that were available during my care of the patient were reviewed by me and considered in my medical decision making (see chart for details).    RYLIEE FIGGE is a 57 y.o. female who presents to ED for acute worsening of chronic back pain. Pain is midline and radiates to groin. Patient demonstrates no saddle anesthesia, bowel or bladder incontinence or neuro deficits. No fevers or other infectious symptoms to suggest that the patient's back pain is due to an infection. Lower extremities are neurovascularly intact. MRI with mild impingement. No emergent findings. Evaluation does not show pathology that would require ongoing emergent intervention or inpatient treatment.  I have reviewed return precautions and the patient has voiced understanding. I reviewed symptomatic home care instructions and importance of following up with her orthopedist. Short course pain control provided for acute pain. Patient voiced understanding and agreement with plan. All questions answered.   Patient discussed with Dr. Jeraldine Loots who agrees with treatment plan.    Final Clinical Impressions(s)  / ED Diagnoses   Final diagnoses:  Midline low back pain, unspecified chronicity, with sciatica presence unspecified    ED Discharge Orders         Ordered    naproxen (NAPROSYN) 500 MG tablet  2 times daily PRN     10/16/17 1109    HYDROcodone-acetaminophen (NORCO) 5-325 MG tablet  Every 6 hours PRN     10/16/17 1109           Ward, Chase Picket, PA-C 10/16/17 1159    Gerhard Munch, MD 10/16/17 1556

## 2017-10-17 ENCOUNTER — Other Ambulatory Visit: Payer: Self-pay

## 2017-10-17 NOTE — Pre-Procedure Instructions (Signed)
Sylvia Hall  10/17/2017      [facility] - [place], [place] [address] [place] [address] Phone: [phone] Fax: [phone]    Your procedure is scheduled on September 12  Report to [facility] Admitting at 200 P.M.  Call this number if you have problems the morning of surgery:  [phone]   Remember:  Do not eat or drink after midnight.     Take these medicines the morning of surgery with A SIP OF WATER  albuterol (PROVENTIL HFA;VENTOLIN HFA)  Fluticasone-Salmeterol (ADVAIR) gabapentin (NEURONTIN)  HYDROcodone-acetaminophen (NORCO) methocarbamol (ROBAXIN) metoprolol succinate (TOPROL-XL)  7 days prior to surgery STOP taking any Aspirin(unless otherwise instructed by your surgeon), Aleve, Naproxen, Ibuprofen, Motrin, Advil, Goody's, BC's, all herbal medications, fish oil, and all vitamins, meloxicam (MOBIC)      Do not wear jewelry  Do not wear lotions, powders, or cologne, or deodorant.  Do not shave 48 hours prior to surgery.    Do not bring valuables to the hospital.  [facility] is not responsible for any belongings or valuables.  Contacts, dentures or bridgework may not be worn into surgery.  Leave your suitcase in the car.  After surgery it may be brought to your room.  For patients admitted to the hospital, discharge time will be determined by your treatment team.  Patients discharged the day of surgery will not be allowed to drive home.   Special instructions:   [place]- Preparing For Surgery  Before surgery, you can play an important role. Because skin is not sterile, your skin needs to be as free of germs as possible. You can reduce the number of germs on your skin by washing with CHG (chlorahexidine gluconate) Soap before surgery.  CHG is an antiseptic cleaner which kills germs and bonds with the skin to continue killing germs even after washing.    Oral Hygiene is also important to  reduce your risk of infection.  Remember - BRUSH YOUR TEETH THE MORNING OF SURGERY WITH YOUR REGULAR TOOTHPASTE  Please do not use if you have an allergy to CHG or antibacterial soaps. If your skin becomes reddened/irritated stop using the CHG.  Do not shave (including legs and underarms) for at least 48 hours prior to first CHG shower. It is OK to shave your face.  Please follow these instructions carefully.   1. Shower the NIGHT BEFORE SURGERY and the MORNING OF SURGERY with CHG.   2. If you chose to wash your hair, wash your hair first as usual with your normal shampoo.  3. After you shampoo, rinse your hair and body thoroughly to remove the shampoo.  4. Use CHG as you would any other liquid soap. You can apply CHG directly to the skin and wash gently with a scrungie or a clean washcloth.   5. Apply the CHG Soap to your body ONLY FROM THE NECK DOWN.  Do not use on open wounds or open sores. Avoid contact with your eyes, ears, mouth and genitals (private parts). Wash Face and genitals (private parts)  with your normal soap.  6. Wash thoroughly, paying special attention to the area where your surgery will be performed.  7. Thoroughly rinse your body with warm water from the neck down.  8. DO NOT shower/wash with your normal soap after using and rinsing off the CHG Soap.  9. Pat yourself dry with a CLEAN TOWEL.  10. Wear CLEAN PAJAMAS  to bed the night before surgery, wear comfortable clothes the morning of surgery  11. Place CLEAN SHEETS on your bed the night of your first shower and DO NOT SLEEP WITH PETS.    Day of Surgery:  Do not apply any deodorants/lotions.  Please wear clean clothes to the hospital/surgery center.   Remember to brush your teeth WITH YOUR REGULAR TOOTHPASTE.    Please read over the following fact sheets that you were given.

## 2017-10-18 ENCOUNTER — Inpatient Hospital Stay (HOSPITAL_COMMUNITY)
Admission: RE | Admit: 2017-10-18 | Discharge: 2017-10-18 | Disposition: A | Discharge status: Home / Self Care | Payer: Self-pay | Source: Ambulatory Visit

## 2017-10-18 ENCOUNTER — Telehealth (INDEPENDENT_AMBULATORY_CARE_PROVIDER_SITE_OTHER): Payer: Self-pay | Admitting: Orthopedic Surgery

## 2017-10-18 NOTE — Pre-Procedure Instructions (Signed)
Laiana Baierl Spenser  10/18/2017      [facility] - [place], [place] - [place] [address] [place] [address] Phone: [phone] Fax: [phone]    Your procedure is scheduled on September 12  Report to [facility] Admitting at 200 P.M.  Call this number if you have problems the morning of surgery:  [phone]   Remember:  Do not eat or drink after midnight.     Take these medicines the morning of surgery with A SIP OF WATER  albuterol (PROVENTIL HFA;VENTOLIN HFA)  Fluticasone-Salmeterol (ADVAIR) gabapentin (NEURONTIN)  HYDROcodone-acetaminophen (NORCO) methocarbamol (ROBAXIN) metoprolol succinate (TOPROL-XL)  7 days prior to surgery STOP taking any Aspirin(unless otherwise instructed by your surgeon), Aleve, Naproxen, Ibuprofen, Motrin, Advil, Goody's, BC's, all herbal medications, fish oil, and all vitamins, meloxicam (MOBIC)      Do not wear jewelry  Do not wear lotions, powders, or cologne, or deodorant.  Do not shave 48 hours prior to surgery.    Do not bring valuables to the hospital.  [facility] is not responsible for any belongings or valuables.  Contacts, dentures or bridgework may not be worn into surgery.  Leave your suitcase in the car.  After surgery it may be brought to your room.  For patients admitted to the hospital, discharge time will be determined by your treatment team.  Patients discharged the day of surgery will not be allowed to drive home.   Special instructions:   - Preparing For Surgery  Before surgery, you can play an important role. Because skin is not sterile, your skin needs to be as free of germs as possible. You can reduce the number of germs on your skin by washing with CHG (chlorahexidine gluconate) Soap before surgery.  CHG is an antiseptic cleaner which kills germs and bonds with the skin to continue killing germs even after washing.    Oral Hygiene is also important to  reduce your risk of infection.  Remember - BRUSH YOUR TEETH THE MORNING OF SURGERY WITH YOUR REGULAR TOOTHPASTE  Please do not use if you have an allergy to CHG or antibacterial soaps. If your skin becomes reddened/irritated stop using the CHG.  Do not shave (including legs and underarms) for at least 48 hours prior to first CHG shower. It is OK to shave your face.  Please follow these instructions carefully.   1. Shower the NIGHT BEFORE SURGERY and the MORNING OF SURGERY with CHG.   2. If you chose to wash your hair, wash your hair first as usual with your normal shampoo.  3. After you shampoo, rinse your hair and body thoroughly to remove the shampoo.  4. Use CHG as you would any other liquid soap. You can apply CHG directly to the skin and wash gently with a scrungie or a clean washcloth.   5. Apply the CHG Soap to your body ONLY FROM THE NECK DOWN.  Do not use on open wounds or open sores. Avoid contact with your eyes, ears, mouth and genitals (private parts). Wash Face and genitals (private parts)  with your normal soap.  6. Wash thoroughly, paying special attention to the area where your surgery will be performed.  7. Thoroughly rinse your body with warm water from the neck down.  8. DO NOT shower/wash with your normal soap after using and rinsing off the CHG Soap.  9. Pat yourself dry with a CLEAN TOWEL.  10. Wear CLEAN PAJAMAS  to bed the night before surgery, wear comfortable clothes the morning of surgery  11. Place CLEAN SHEETS on your bed the night of your first shower and DO NOT SLEEP WITH PETS.    Day of Surgery:  Do not apply any deodorants/lotions.  Please wear clean clothes to the hospital/surgery center.   Remember to brush your teeth WITH YOUR REGULAR TOOTHPASTE.    Please read over the following fact sheets that you were given.

## 2017-10-18 NOTE — Telephone Encounter (Signed)
[name] called to cancel her pre-op appointment and surgery that is scheduled for 10/20/2017.  She stated that she "pulled her back out" lifting a bag of dog food and can hardly walk right now. She would like Debbie to give her call to reschedule the surgery.

## 2017-10-28 ENCOUNTER — Other Ambulatory Visit (INDEPENDENT_AMBULATORY_CARE_PROVIDER_SITE_OTHER): Payer: Self-pay | Admitting: Orthopedic Surgery

## 2017-10-31 ENCOUNTER — Inpatient Hospital Stay (INDEPENDENT_AMBULATORY_CARE_PROVIDER_SITE_OTHER): Payer: Self-pay | Admitting: Orthopedic Surgery

## 2017-11-09 ENCOUNTER — Other Ambulatory Visit: Payer: Self-pay

## 2017-11-09 ENCOUNTER — Encounter (HOSPITAL_COMMUNITY): Payer: Self-pay | Admitting: *Deleted

## 2017-11-09 NOTE — Progress Notes (Signed)
Anesthesia Chart Review: Sylvia Hall  Case:  [id number] Date/Time:  11/10/17 1522   Procedure:  LEFT CARPAL TUNNEL RELEASE (Left )   Anesthesia type:  Regional   Pre-op diagnosis:  LEFT CARPAL TUNNEL SYNDROME   Location:  [facility] OR ROOM 06 / [facility] OR   Surgeon:  Cammy Copa, MD      DISCUSSION: Patient is a 57 year old female scheduled for the above procedure.    History includes smoking, asthma, MVP, PTSD, DVT (in pregnancy), GERD, seizures (high school).    If no acute changes then I would anticipate that she can proceed as planned.   PROVIDERS: Hoy Register, MD is PCP Rollene Rotunda, MD is cardiologist. Last visit 04/04/17 for follow-up palpitations. Two year follow-up recommended.    LABS: She is for updated labs on the day surgery. Cr 0.69 05/19/17.    EKG: 04/04/17: NSR, non-specific T wave abnormality.   CV: Echo 12/02/13: Study Conclusions - Left ventricle: The cavity size was normal. Wall thickness was normal. Systolic function was normal. The estimated ejection fraction was in the range of 55% to 60%. Wall motion was normal; there were no regional wall motion abnormalities. Diastolic dysfunction present, grade indeterminate. Tissue Doppler was not performed, thus filling pressures could not be assessed. - Ventricular septum: Septal motion showed abnormal function and dyssynergy. These changes are consistent with intraventricular conduction delay. - Mitral valve: Mildly thickened leaflets. No significant regurgitation.  Carotid U/S 12/03/13: Summary: Findings suggest 1-39% internal carotid artery stenosis bilaterally. Vertebral arteries are patent with antegrade flow.   Past Medical History:  Diagnosis Date  . Anxiety   . Arthritis   . Asthma   . Back pain, chronic   . Bilateral carpal tunnel syndrome   . Depression   . DVT (deep vein thrombosis) in pregnancy   . Environmental allergies   . Epilepsy (HCC)   . Fatigue   . GERD  (gastroesophageal reflux disease)   . Headache    sinus  . History of kidney stones   . Mitral valve prolapse   . Palpitations   . Pericarditis   . Pneumonia   . PTSD (post-traumatic stress disorder)   . Stenosis of lumbosacral spine    numbness L hand and L leg  . Thrombophlebitis    Age 9  . Wears glasses     Past Surgical History:  Procedure Laterality Date  . ABDOMINAL HYSTERECTOMY     partial  . APPENDECTOMY    . BIOPSY BREAST     Right breast x 10  . BREAST LUMPECTOMY     Right breast  . CESAREAN SECTION     x 2  . NOSE SURGERY     s/p trauma  . OVARY SURGERY    . RHINOPLASTY    . TUBAL LIGATION     left tubal  . UTERINE FIBROID SURGERY      MEDICATIONS: No current facility-administered medications for this encounter.    Marland Kitchen albuterol (PROVENTIL HFA;VENTOLIN HFA) 108 (90 Base) MCG/ACT inhaler  . Fluticasone-Salmeterol (ADVAIR) 250-50 MCG/DOSE AEPB  . gabapentin (NEURONTIN) 600 MG tablet  . lidocaine (LIDODERM) 5 %  . methocarbamol (ROBAXIN) 500 MG tablet  . metoprolol succinate (TOPROL-XL) 50 MG 24 hr tablet  . rOPINIRole (REQUIP XL) 2 MG 24 hr tablet  . Vitamin D, Ergocalciferol, (DRISDOL) 50000 units CAPS capsule  . benzonatate (TESSALON) 100 MG capsule  . DULoxetine (CYMBALTA) 30 MG capsule  . HYDROcodone-acetaminophen (NORCO) 5-325 MG tablet  .  meloxicam (MOBIC) 15 MG tablet  . metoprolol tartrate (LOPRESSOR) 25 MG tablet  . naproxen (NAPROSYN) 500 MG tablet    [facility]/Anesthesiology Phone [phone] 11/09/2017 2:49 PM

## 2017-11-09 NOTE — Progress Notes (Signed)
Pt denies SOB and chest pain. Pt under the care of Dr. Antoine Poche, Cardiology. Pt denies having a cardiac cath but stated that a stress test was performed > 15 years ago. Pt denies having a chest x ray. Pt denies recent labs. Pt stated that she does not take Aspirin. Pt made aware to stop taking vitamins, fish oil and herbal medications. Do not take any NSAIDs ie: Ibuprofen, Advil, Naproxen ( Naprosyn/Aleve), Motrin, BC and Goody Powder. Pt verbalized understanding of all pre-op instructions. Anesthesia asked to review pt history.

## 2017-11-10 ENCOUNTER — Other Ambulatory Visit: Payer: Self-pay

## 2017-11-10 ENCOUNTER — Ambulatory Visit (HOSPITAL_COMMUNITY): Payer: Self-pay | Admitting: Vascular Surgery

## 2017-11-10 ENCOUNTER — Encounter (HOSPITAL_COMMUNITY): Payer: Self-pay | Admitting: Surgery

## 2017-11-10 ENCOUNTER — Encounter (HOSPITAL_COMMUNITY)
Admission: RE | Disposition: A | Discharge status: Home / Self Care | Payer: Self-pay | Source: Ambulatory Visit | Attending: Orthopedic Surgery

## 2017-11-10 ENCOUNTER — Ambulatory Visit (HOSPITAL_COMMUNITY)
Admission: RE | Admit: 2017-11-10 | Discharge: 2017-11-10 | Disposition: A | Discharge status: Home / Self Care | Payer: Self-pay | Source: Ambulatory Visit | Attending: Orthopedic Surgery | Admitting: Orthopedic Surgery

## 2017-11-10 DIAGNOSIS — G5602 Carpal tunnel syndrome, left upper limb: Secondary | ICD-10-CM | POA: Insufficient documentation

## 2017-11-10 DIAGNOSIS — K219 Gastro-esophageal reflux disease without esophagitis: Secondary | ICD-10-CM | POA: Insufficient documentation

## 2017-11-10 DIAGNOSIS — Z79899 Other long term (current) drug therapy: Secondary | ICD-10-CM | POA: Insufficient documentation

## 2017-11-10 DIAGNOSIS — I341 Nonrheumatic mitral (valve) prolapse: Secondary | ICD-10-CM | POA: Insufficient documentation

## 2017-11-10 DIAGNOSIS — Z880 Allergy status to penicillin: Secondary | ICD-10-CM | POA: Insufficient documentation

## 2017-11-10 DIAGNOSIS — M1990 Unspecified osteoarthritis, unspecified site: Secondary | ICD-10-CM | POA: Insufficient documentation

## 2017-11-10 DIAGNOSIS — Z882 Allergy status to sulfonamides status: Secondary | ICD-10-CM | POA: Insufficient documentation

## 2017-11-10 DIAGNOSIS — Z7951 Long term (current) use of inhaled steroids: Secondary | ICD-10-CM | POA: Insufficient documentation

## 2017-11-10 DIAGNOSIS — G8929 Other chronic pain: Secondary | ICD-10-CM | POA: Insufficient documentation

## 2017-11-10 DIAGNOSIS — F329 Major depressive disorder, single episode, unspecified: Secondary | ICD-10-CM | POA: Insufficient documentation

## 2017-11-10 DIAGNOSIS — Z888 Allergy status to other drugs, medicaments and biological substances status: Secondary | ICD-10-CM | POA: Insufficient documentation

## 2017-11-10 DIAGNOSIS — J45909 Unspecified asthma, uncomplicated: Secondary | ICD-10-CM | POA: Insufficient documentation

## 2017-11-10 DIAGNOSIS — F17210 Nicotine dependence, cigarettes, uncomplicated: Secondary | ICD-10-CM | POA: Insufficient documentation

## 2017-11-10 HISTORY — DX: Unspecified osteoarthritis, unspecified site: M19.90

## 2017-11-10 HISTORY — DX: Gastro-esophageal reflux disease without esophagitis: K21.9

## 2017-11-10 HISTORY — DX: Other allergy status, other than to drugs and biological substances: Z91.09

## 2017-11-10 HISTORY — DX: Headache, unspecified: R51.9

## 2017-11-10 HISTORY — DX: Pneumonia, unspecified organism: J18.9

## 2017-11-10 HISTORY — DX: Carpal tunnel syndrome, bilateral upper limbs: G56.03

## 2017-11-10 HISTORY — DX: Presence of spectacles and contact lenses: Z97.3

## 2017-11-10 HISTORY — PX: CARPAL TUNNEL RELEASE: SHX101

## 2017-11-10 HISTORY — DX: Headache: R51

## 2017-11-10 HISTORY — DX: Deep phlebothrombosis in pregnancy, unspecified trimester: O22.30

## 2017-11-10 HISTORY — DX: Palpitations: R00.2

## 2017-11-10 HISTORY — DX: Personal history of urinary calculi: Z87.442

## 2017-11-10 LAB — CBC
HEMATOCRIT: 44.2 % (ref 36.0–46.0)
Hemoglobin: 14.5 g/dL (ref 12.0–15.0)
MCH: 31.9 pg (ref 26.0–34.0)
MCHC: 32.8 g/dL (ref 30.0–36.0)
MCV: 97.4 fL (ref 78.0–100.0)
Platelets: 162 10*3/uL (ref 150–400)
RBC: 4.54 MIL/uL (ref 3.87–5.11)
RDW: 12.1 % (ref 11.5–15.5)
WBC: 4.7 10*3/uL (ref 4.0–10.5)

## 2017-11-10 LAB — BASIC METABOLIC PANEL
Anion gap: 11 (ref 5–15)
BUN: 15 mg/dL (ref 6–20)
CALCIUM: 9.3 mg/dL (ref 8.9–10.3)
CO2: 21 mmol/L — ABNORMAL LOW (ref 22–32)
Chloride: 108 mmol/L (ref 98–111)
Creatinine, Ser: 0.75 mg/dL (ref 0.44–1.00)
GFR calc Af Amer: >60 mL/min (ref >60)
GLUCOSE: 88 mg/dL (ref 70–99)
Potassium: 3.8 mmol/L (ref 3.5–5.1)
Sodium: 140 mmol/L (ref 135–145)

## 2017-11-10 SURGERY — CARPAL TUNNEL RELEASE
Anesthesia: Monitor Anesthesia Care | Laterality: Left

## 2017-11-10 MED ORDER — FENTANYL CITRATE (PF) 250 MCG/5ML IJ SOLN
INTRAMUSCULAR | Status: AC
  Filled 2017-11-10: qty 5

## 2017-11-10 MED ORDER — SUCCINYLCHOLINE CHLORIDE 200 MG/10ML IV SOSY
PREFILLED_SYRINGE | INTRAVENOUS | Status: AC
  Filled 2017-11-10: qty 20

## 2017-11-10 MED ORDER — PHENYLEPHRINE HCL 10 MG/ML IJ SOLN
INTRAMUSCULAR | Status: DC | PRN
  Administered 2017-11-10: 120 ug via INTRAVENOUS
  Administered 2017-11-10: 80 ug via INTRAVENOUS
  Administered 2017-11-10: 120 ug via INTRAVENOUS

## 2017-11-10 MED ORDER — ONDANSETRON HCL 4 MG/2ML IJ SOLN
INTRAMUSCULAR | Status: AC
  Filled 2017-11-10: qty 4

## 2017-11-10 MED ORDER — MIDAZOLAM HCL 5 MG/5ML IJ SOLN
INTRAMUSCULAR | Status: DC | PRN
  Administered 2017-11-10: 2 mg via INTRAVENOUS

## 2017-11-10 MED ORDER — 0.9 % SODIUM CHLORIDE (POUR BTL) OPTIME
TOPICAL | Status: DC | PRN
  Administered 2017-11-10: 1000 mL

## 2017-11-10 MED ORDER — BUPIVACAINE HCL (PF) 0.25 % IJ SOLN
INTRAMUSCULAR | Status: DC | PRN
  Administered 2017-11-10: 10 mL

## 2017-11-10 MED ORDER — OXYCODONE HCL 5 MG PO TABS: 5.0000 mg | ORAL_TABLET | Freq: Once | ORAL | Status: DC | PRN

## 2017-11-10 MED ORDER — FENTANYL CITRATE (PF) 100 MCG/2ML IJ SOLN: 25.0000 ug | INTRAMUSCULAR | Status: DC | PRN

## 2017-11-10 MED ORDER — LIDOCAINE HCL (PF) 0.5 % IJ SOLN
INTRAMUSCULAR | Status: DC | PRN
  Administered 2017-11-10: 30 mL via INTRAVENOUS

## 2017-11-10 MED ORDER — LIDOCAINE 2% (20 MG/ML) 5 ML SYRINGE
INTRAMUSCULAR | Status: AC
  Filled 2017-11-10: qty 5

## 2017-11-10 MED ORDER — FENTANYL CITRATE (PF) 100 MCG/2ML IJ SOLN
INTRAMUSCULAR | Status: DC | PRN
  Administered 2017-11-10: 50 ug via INTRAVENOUS

## 2017-11-10 MED ORDER — LACTATED RINGERS IV SOLN
INTRAVENOUS | Status: DC
  Administered 2017-11-10: 16:00:00 via INTRAVENOUS

## 2017-11-10 MED ORDER — EPHEDRINE 5 MG/ML INJ
INTRAVENOUS | Status: AC
  Filled 2017-11-10: qty 10

## 2017-11-10 MED ORDER — SUGAMMADEX SODIUM 500 MG/5ML IV SOLN
INTRAVENOUS | Status: AC
  Filled 2017-11-10: qty 5

## 2017-11-10 MED ORDER — PROPOFOL 500 MG/50ML IV EMUL
INTRAVENOUS | Status: DC | PRN
  Administered 2017-11-10: 100 ug/kg/min via INTRAVENOUS

## 2017-11-10 MED ORDER — PROPOFOL 10 MG/ML IV BOLUS
INTRAVENOUS | Status: AC
  Filled 2017-11-10: qty 20

## 2017-11-10 MED ORDER — PROPOFOL 1000 MG/100ML IV EMUL
INTRAVENOUS | Status: AC
  Filled 2017-11-10: qty 100

## 2017-11-10 MED ORDER — CHLORHEXIDINE GLUCONATE 4 % EX LIQD: 60.0000 mL | Freq: Once | CUTANEOUS | Status: DC

## 2017-11-10 MED ORDER — SCOPOLAMINE 1 MG/3DAYS TD PT72
MEDICATED_PATCH | TRANSDERMAL | Status: AC
  Filled 2017-11-10: qty 2

## 2017-11-10 MED ORDER — VANCOMYCIN HCL IN DEXTROSE 1-5 GM/200ML-% IV SOLN
1000.0000 mg | INTRAVENOUS | Status: AC
  Administered 2017-11-10: 1000 mg via INTRAVENOUS

## 2017-11-10 MED ORDER — MIDAZOLAM HCL 2 MG/2ML IJ SOLN
INTRAMUSCULAR | Status: AC
  Filled 2017-11-10: qty 2

## 2017-11-10 MED ORDER — BUPIVACAINE HCL (PF) 0.25 % IJ SOLN
INTRAMUSCULAR | Status: AC
  Filled 2017-11-10: qty 30

## 2017-11-10 MED ORDER — OXYCODONE HCL 5 MG/5ML PO SOLN: 5.0000 mg | Freq: Once | ORAL | Status: DC | PRN

## 2017-11-10 MED ORDER — ONDANSETRON HCL 4 MG/2ML IJ SOLN: 4.0000 mg | Freq: Four times a day (QID) | INTRAMUSCULAR | Status: DC | PRN

## 2017-11-10 SURGICAL SUPPLY — 47 items
BANDAGE ACE 3X5.8 VEL STRL LF (GAUZE/BANDAGES/DRESSINGS) ×6 IMPLANT
BANDAGE ACE 4X5 VEL STRL LF (GAUZE/BANDAGES/DRESSINGS) ×3 IMPLANT
BNDG ESMARK 4X9 LF (GAUZE/BANDAGES/DRESSINGS) IMPLANT
BNDG GAUZE ELAST 4 BULKY (GAUZE/BANDAGES/DRESSINGS) ×3 IMPLANT
CORDS BIPOLAR (ELECTRODE) ×3 IMPLANT
COVER SURGICAL LIGHT HANDLE (MISCELLANEOUS) ×3 IMPLANT
COVER WAND RF STERILE (DRAPES) IMPLANT
CUFF TOURN SGL LL 12 NO SLV (MISCELLANEOUS) ×3 IMPLANT
CUFF TOURNIQUET SINGLE 18IN (TOURNIQUET CUFF) IMPLANT
CUFF TOURNIQUET SINGLE 24IN (TOURNIQUET CUFF) IMPLANT
DRAPE SURG 17X23 STRL (DRAPES) ×3 IMPLANT
DRSG PAD ABDOMINAL 8X10 ST (GAUZE/BANDAGES/DRESSINGS) ×3 IMPLANT
DURAPREP 26ML APPLICATOR (WOUND CARE) ×3 IMPLANT
GAUZE SPONGE 4X4 12PLY STRL (GAUZE/BANDAGES/DRESSINGS) ×3 IMPLANT
GAUZE XEROFORM 1X8 LF (GAUZE/BANDAGES/DRESSINGS) ×3 IMPLANT
GAUZE XEROFORM 5X9 LF (GAUZE/BANDAGES/DRESSINGS) ×3 IMPLANT
GLOVE BIOGEL PI IND STRL 8 (GLOVE) ×1 IMPLANT
GLOVE BIOGEL PI INDICATOR 8 (GLOVE) ×2
GLOVE SURG ORTHO 8.0 STRL STRW (GLOVE) ×3 IMPLANT
GOWN STRL REUS W/ TWL LRG LVL3 (GOWN DISPOSABLE) ×2 IMPLANT
GOWN STRL REUS W/ TWL XL LVL3 (GOWN DISPOSABLE) ×1 IMPLANT
GOWN STRL REUS W/TWL LRG LVL3 (GOWN DISPOSABLE) ×4
GOWN STRL REUS W/TWL XL LVL3 (GOWN DISPOSABLE) ×2
KIT BASIN OR (CUSTOM PROCEDURE TRAY) ×3 IMPLANT
KIT TURNOVER KIT B (KITS) ×3 IMPLANT
LOOP VESSEL MAXI BLUE (MISCELLANEOUS) ×3 IMPLANT
NEEDLE HYPO 25GX1X1/2 BEV (NEEDLE) IMPLANT
NS IRRIG 1000ML POUR BTL (IV SOLUTION) ×3 IMPLANT
PACK ORTHO EXTREMITY (CUSTOM PROCEDURE TRAY) ×3 IMPLANT
PAD ARMBOARD 7.5X6 YLW CONV (MISCELLANEOUS) ×6 IMPLANT
PAD CAST 4YDX4 CTTN HI CHSV (CAST SUPPLIES) ×1 IMPLANT
PADDING CAST COTTON 4X4 STRL (CAST SUPPLIES) ×2
RUBBERBAND STERILE (MISCELLANEOUS) ×3 IMPLANT
SUCTION FRAZIER HANDLE 10FR (MISCELLANEOUS)
SUCTION TUBE FRAZIER 10FR DISP (MISCELLANEOUS) IMPLANT
SUT ETHILON 3 0 PS 1 (SUTURE) ×3 IMPLANT
SUT VIC AB 2-0 CT1 27 (SUTURE)
SUT VIC AB 2-0 CT1 TAPERPNT 27 (SUTURE) IMPLANT
SUT VIC AB 3-0 FS2 27 (SUTURE) IMPLANT
SYR CONTROL 10ML LL (SYRINGE) IMPLANT
SYSTEM CHEST DRAIN TLS 7FR (DRAIN) IMPLANT
TOWEL OR 17X24 6PK STRL BLUE (TOWEL DISPOSABLE) ×3 IMPLANT
TOWEL OR 17X26 10 PK STRL BLUE (TOWEL DISPOSABLE) ×3 IMPLANT
TUBE CONNECTING 12'X1/4 (SUCTIONS)
TUBE CONNECTING 12X1/4 (SUCTIONS) IMPLANT
UNDERPAD 30X30 (UNDERPADS AND DIAPERS) ×3 IMPLANT
WATER STERILE IRR 1000ML POUR (IV SOLUTION) ×3 IMPLANT

## 2017-11-10 NOTE — Anesthesia Preprocedure Evaluation (Signed)
Anesthesia Evaluation  Patient identified by MRN, date of birth, ID band Patient awake    Reviewed: Allergy & Precautions, H&P , NPO status , Patient's Chart, lab work & pertinent test results  Airway Mallampati: II   Neck ROM: full    Dental   Pulmonary asthma , Current Smoker,    breath sounds clear to auscultation       Cardiovascular negative cardio ROS   Rhythm:regular Rate:Normal     Neuro/Psych  Headaches, Seizures -,  PSYCHIATRIC DISORDERS Anxiety Depression  Neuromuscular disease    GI/Hepatic GERD  ,  Endo/Other  obese  Renal/GU stones     Musculoskeletal  (+) Arthritis ,   Abdominal   Peds  Hematology   Anesthesia Other Findings   Reproductive/Obstetrics                             Anesthesia Physical Anesthesia Plan  ASA: II  Anesthesia Plan: MAC   Post-op Pain Management:    Induction: Intravenous  PONV Risk Score and Plan: 1 and Ondansetron, Propofol infusion, Treatment may vary due to age or medical condition and Midazolam  Airway Management Planned: Simple Face Mask  Additional Equipment:   Intra-op Plan:   Post-operative Plan:   Informed Consent: I have reviewed the patients History and Physical, chart, labs and discussed the procedure including the risks, benefits and alternatives for the proposed anesthesia with the patient or authorized representative who has indicated his/her understanding and acceptance.     Plan Discussed with: CRNA, Anesthesiologist and Surgeon  Anesthesia Plan Comments:         Anesthesia Quick Evaluation

## 2017-11-10 NOTE — Anesthesia Postprocedure Evaluation (Signed)
Anesthesia Post Note  Patient: KAIDYNCE PFISTER  Procedure(s) Performed: LEFT CARPAL TUNNEL RELEASE (Left )     Patient location during evaluation: PACU Anesthesia Type: MAC and Bier Block Level of consciousness: awake and alert Pain management: pain level controlled Vital Signs Assessment: post-procedure vital signs reviewed and stable Respiratory status: spontaneous breathing, nonlabored ventilation, respiratory function stable and patient connected to nasal cannula oxygen Cardiovascular status: stable and blood pressure returned to baseline Postop Assessment: no apparent nausea or vomiting Anesthetic complications: no    Last Vitals:  Vitals:   11/10/17 1718 11/10/17 1733  BP: (!) 107/49 124/70  Pulse: 71 69  Resp: 19 18  Temp: 36.6 C   SpO2: 99% 97%    Last Pain:  Vitals:   11/10/17 1731  TempSrc:   PainSc: 0-No pain                 Effie Berkshire

## 2017-11-10 NOTE — Op Note (Signed)
NAME: Sylvia Hall, Sylvia Hall MEDICAL RECORD [id number] ACCOUNT [id number] DATE OF BIRTH:[birth year] FACILITY: [facility] LOCATION: [facility] PHYSICIAN:Stormy Connon Diamantina Providence, MD  OPERATIVE REPORT  DATE OF PROCEDURE:  11/10/2017  PREOPERATIVE DIAGNOSIS:  Left carpal tunnel syndrome.  POSTOPERATIVE DIAGNOSIS:  Left carpal tunnel syndrome.  PROCEDURE:  Left carpal tunnel release.  SURGEON:  Cammy Copa, MD  ASSISTANT:  None.  ANESTHESIA:  IV regional.  INDICATIONS:  [name] is a 57 year old patient with severe carpal tunnel syndrome.  She presents for operative management after explanation of risks and benefits.  PROCEDURE IN DETAIL:  The patient was brought to the operating room where IV regional anesthetic was induced.  Preoperative antibiotics were administered.  Timeout was called.  Left hand prescrubbed with alcohol and Betadine and allowed to air dry.   Prepped with DuraPrep solution and draped in a sterile manner.  Ioban used to cover the operative field arm.  After IV regional anesthetic was induced, an incision was made at the intersection of Kaplan's cardinal line and the radial border of the 4th  finger.  Skin and subcutaneous tissue was sharply divided.  Transverse carpal ligament was identified and divided along its midsection distally under direct visualization and proximally to the forearm fascia under direct visualization.  There were no  masses within the carpal tunnel.  The nerve itself appeared compressed.  Following the release, skin edges were anesthetized.  Thorough irrigation was performed.  Tourniquet was released and bleeding points encountered were controlled using bipolar  electrocautery.  Skin was closed using 3-0 simple nylon sutures.  A well-padded volar splint was applied.  The patient tolerated the procedure well without immediate complications and was transferred to the recovery room in stable condition.  LN/NUANCE  D:11/10/2017 T:11/10/2017 JOB:[id number]

## 2017-11-10 NOTE — Brief Op Note (Signed)
11/10/2017  5:15 PM  PATIENT:  Sylvia Hall  57 y.o. female  PRE-OPERATIVE DIAGNOSIS:  LEFT CARPAL TUNNEL SYNDROME  POST-OPERATIVE DIAGNOSIS:  LEFT CARPAL TUNNEL SYNDROME  PROCEDURE:  Procedure(s): LEFT CARPAL TUNNEL RELEASE  SURGEON:  Surgeon(s): Cammy Copa, MD  ASSISTANT: none  ANESTHESIA:   regional  EBL: 1 ml    No intake/output data recorded.  BLOOD ADMINISTERED: none  DRAINS: none   LOCAL MEDICATIONS USED: plain marcaine  SPECIMEN:  No Specimen  COUNTS:  YES  TOURNIQUET:  * Missing tourniquet times found for documented tourniquets in log: [id number] *20 min at 250  DICTATION: .Other Dictation: Dictation Number [id number]  PLAN OF CARE: Discharge to home after PACU  PATIENT DISPOSITION:  PACU - hemodynamically stable

## 2017-11-10 NOTE — H&P (Signed)
Sylvia Hall is an 57 y.o. female.   Chief Complaint: Left wrist pain and numbness HPI: Sylvia Hall is a 57 year old patient with left wrist pain and numbness and tingling.  She is had this for a long period of time.  Cervical spine MRI is negative.  EMG nerve study shows severe carpal tunnel syndrome bilaterally left worse than right.  She has failed conservative measures including splinting and activity modification.  She describes pain and symptoms on a daily basis.  Past Medical History:  Diagnosis Date  . Anxiety   . Arthritis   . Asthma   . Back pain, chronic   . Bilateral carpal tunnel syndrome   . Depression   . DVT (deep vein thrombosis) in pregnancy   . Environmental allergies   . Epilepsy (HCC)   . Fatigue   . GERD (gastroesophageal reflux disease)   . Headache    sinus  . History of kidney stones   . Mitral valve prolapse   . Palpitations   . Pericarditis   . Pneumonia   . PTSD (post-traumatic stress disorder)   . Stenosis of lumbosacral spine    numbness L hand and L leg  . Thrombophlebitis    Age 48  . Wears glasses     Past Surgical History:  Procedure Laterality Date  . ABDOMINAL HYSTERECTOMY     partial  . APPENDECTOMY    . BIOPSY BREAST     Right breast x 10  . BREAST LUMPECTOMY     Right breast  . CESAREAN SECTION     x 2  . NOSE SURGERY     s/p trauma  . OVARY SURGERY    . RHINOPLASTY    . TUBAL LIGATION     left tubal  . UTERINE FIBROID SURGERY      Family History  Problem Relation Age of Onset  . Diabetes Mother   . Mitral valve prolapse Mother   . Asthma Mother   . CAD Mother 58  . Anxiety disorder Mother   . Mitral valve prolapse Sister   . Asthma Sister   . Mitral valve prolapse Son   . Alcohol abuse Son   . Diabetes Maternal Uncle   . Diabetes Maternal Grandmother   . Mitral valve prolapse Sister   . CAD Son 28       No details available  . Alcohol abuse Father   . Alcohol abuse Brother    Social History:  reports that  she has been smoking cigarettes. She has a 12.00 pack-year smoking history. She has never used smokeless tobacco. She reports that she does not drink alcohol or use drugs.  Allergies:  Allergies  Allergen Reactions  . Phenytoin Sodium Extended Other (See Comments)    seizures  . Ivp Dye [Iodinated Diagnostic Agents] Hives  . Levofloxacin Hives  . Sulfa Drugs Cross Reactors Hives  . Aspirin Other (See Comments)    Upset stomach, resolves with GERD meds   . Caffeine Other (See Comments)    Heart races  . Cymbalta [Duloxetine Hcl] Diarrhea and Nausea Only    Dizzy  . Penicillins Rash    Pt called in stating she  Was prescribed Pen VK in ED and developed a generalized raised rash.  She has taken amoxicillin and augmentin since this time and not had any problem  . Pregabalin Rash  . Sodium Nitrate Other (See Comments)    headaches    No medications prior to admission.  No results found for this or any previous visit (from the past 48 hour(s)). No results found.  Review of Systems  Musculoskeletal: Positive for joint pain.  All other systems reviewed and are negative.   There were no vitals taken for this visit. Physical Exam  Constitutional: She appears well-developed.  HENT:  Head: Normocephalic.  Eyes: Pupils are equal, round, and reactive to light.  Neck: Normal range of motion.  Cardiovascular: Normal rate.  Respiratory: Effort normal.  Neurological: She is alert.  Skin: Skin is warm.  Psychiatric: She has a normal mood and affect.  Examination of that left hand demonstrates intact abductor pollicis brevis function.  Negative Tinel's in the cubital tunnel.  Slight flattening of the abductor pollicis brevis.  EPL FPL interosseous function intact.  No definite paresthesias on the dorsal aspect of the hand but they are definitely paresthesias on the palmar aspect.  Radial pulse is intact.  Assessment/Plan Impression is severe left carpal tunnel syndrome.  Plan is open  carpal tunnel release.  Risks and benefits are discussed including but not limited to infection nerve vessel damage as well as potential prolonged period of time in order to achieve relief.  Patient understands the risk and benefits.  She would likely have the other wrist done as well depending on the recovery of the left wrist.  All questions answered  Burnard Bunting, MD 11/10/2017, 11:34 AM

## 2017-11-10 NOTE — Discharge Instructions (Signed)
Carpal Tunnel Release, Care After Refer to this sheet in the next few weeks. These instructions provide you with information about caring for yourself after your procedure. Your health care provider may also give you more specific instructions. Your treatment has been planned according to current medical practices, but problems sometimes occur. Call your health care provider if you have any problems or questions after your procedure. What can I expect after the procedure? After your procedure, it is typical to have the following:  Pain.  Numbness.  Tingling.  Swelling.  Stiffness.  Bruising.  Follow these instructions at home:  Take medicines only as directed by your health care provider.  There are many different ways to close and cover an incision, including stitches (sutures), skin glue, and adhesive strips. Follow your health care provider's instructions about: ? Incision care. ? Bandage (dressing) changes and removal. ? Incision closure removal.  Wear a splint or a brace as directed by your surgeon. You may need to do this for 2-3 weeks.  Keep your hand raised (elevated) above the level of your heart while you are resting. Move your fingers often.  Avoid activities that cause hand pain.  Ask your surgeon when you can start to do all of your usual activities again, such as: ? Driving. ? Returning to work. ? Bathing and swimming.  Keep all follow-up visits as directed by your health care provider. This is important. You may need physical therapy for several months to speed healing and regain movement. Contact a health care provider if:  You have drainage, redness, swelling, or pain at your incision site.  You have a fever.  You have chills.  Your pain medicine is not working.  Your symptoms do not go away after 2 months.  Your symptoms go away and then return. Get help right away if:  You have pain or numbness that is getting worse.  Your fingers change  color.  You are not able to move your fingers. This information is not intended to replace advice given to you by your health care provider. Make sure you discuss any questions you have with your health care provider. Document Released: 08/14/2004 Document Revised: 07/03/2015 Document Reviewed: 09/12/2013 Elsevier Interactive Patient Education  2018 [employer] Inc[employer]  

## 2017-11-10 NOTE — Transfer of Care (Signed)
Immediate Anesthesia Transfer of Care Note  Patient: Sylvia Hall  Procedure(s) Performed: LEFT CARPAL TUNNEL RELEASE (Left )  Patient Location: PACU  Anesthesia Type:Bier block  Level of Consciousness: awake, alert , oriented and patient cooperative  Airway & Oxygen Therapy: Patient Spontanous Breathing and Patient connected to nasal cannula oxygen  Post-op Assessment: Report given to RN and Post -op Vital signs reviewed and stable  Post vital signs: Reviewed and stable  Last Vitals:  Vitals Value Taken Time  BP    Temp    Pulse    Resp    SpO2      Last Pain:  Vitals:   11/10/17 1731  TempSrc:   PainSc: 0-No pain      Patients Stated Pain Goal: 6 (11/10/17 1330)  Complications: No apparent anesthesia complications

## 2017-11-11 ENCOUNTER — Encounter (HOSPITAL_COMMUNITY): Payer: Self-pay | Admitting: Orthopedic Surgery

## 2017-11-14 MED FILL — METOPROLOL SUCCINATE ER 50: 50 | 30 days supply | Qty: 30 | Fill #7

## 2017-11-16 ENCOUNTER — Encounter: Payer: Self-pay | Admitting: Family Medicine

## 2017-11-16 ENCOUNTER — Ambulatory Visit: Payer: Self-pay | Attending: Family Medicine | Admitting: Family Medicine

## 2017-11-16 VITALS — BP 108/76 | HR 69 | Temp 98.0°F | Ht 63.0 in | Wt 192.2 lb

## 2017-11-16 DIAGNOSIS — E7800 Pure hypercholesterolemia, unspecified: Secondary | ICD-10-CM

## 2017-11-16 DIAGNOSIS — E559 Vitamin D deficiency, unspecified: Secondary | ICD-10-CM

## 2017-11-16 DIAGNOSIS — Z886 Allergy status to analgesic agent status: Secondary | ICD-10-CM | POA: Insufficient documentation

## 2017-11-16 DIAGNOSIS — Z880 Allergy status to penicillin: Secondary | ICD-10-CM | POA: Insufficient documentation

## 2017-11-16 DIAGNOSIS — G2581 Restless legs syndrome: Secondary | ICD-10-CM

## 2017-11-16 DIAGNOSIS — G4709 Other insomnia: Secondary | ICD-10-CM

## 2017-11-16 DIAGNOSIS — J0110 Acute frontal sinusitis, unspecified: Secondary | ICD-10-CM

## 2017-11-16 DIAGNOSIS — Z79899 Other long term (current) drug therapy: Secondary | ICD-10-CM | POA: Insufficient documentation

## 2017-11-16 DIAGNOSIS — M5136 Other intervertebral disc degeneration, lumbar region: Secondary | ICD-10-CM

## 2017-11-16 DIAGNOSIS — Z9851 Tubal ligation status: Secondary | ICD-10-CM | POA: Insufficient documentation

## 2017-11-16 DIAGNOSIS — G5603 Carpal tunnel syndrome, bilateral upper limbs: Secondary | ICD-10-CM

## 2017-11-16 DIAGNOSIS — G4700 Insomnia, unspecified: Secondary | ICD-10-CM | POA: Insufficient documentation

## 2017-11-16 DIAGNOSIS — Z90710 Acquired absence of both cervix and uterus: Secondary | ICD-10-CM | POA: Insufficient documentation

## 2017-11-16 DIAGNOSIS — R05 Cough: Secondary | ICD-10-CM | POA: Insufficient documentation

## 2017-11-16 DIAGNOSIS — J4520 Mild intermittent asthma, uncomplicated: Secondary | ICD-10-CM

## 2017-11-16 DIAGNOSIS — F4310 Post-traumatic stress disorder, unspecified: Secondary | ICD-10-CM | POA: Insufficient documentation

## 2017-11-16 DIAGNOSIS — Z9049 Acquired absence of other specified parts of digestive tract: Secondary | ICD-10-CM | POA: Insufficient documentation

## 2017-11-16 DIAGNOSIS — Z882 Allergy status to sulfonamides status: Secondary | ICD-10-CM | POA: Insufficient documentation

## 2017-11-16 DIAGNOSIS — Z888 Allergy status to other drugs, medicaments and biological substances status: Secondary | ICD-10-CM | POA: Insufficient documentation

## 2017-11-16 MED ORDER — ALBUTEROL SULFATE HFA 108 (90 BASE) MCG/ACT IN AERS: 2.0000 | INHALATION_SPRAY | Freq: Four times a day (QID) | RESPIRATORY_TRACT | 1 refills | Status: DC | PRN

## 2017-11-16 MED ORDER — CETIRIZINE HCL 10 MG PO TABS: 10.0000 mg | ORAL_TABLET | Freq: Every day | ORAL | 1 refills | Status: DC

## 2017-11-16 MED ORDER — FLUTICASONE-SALMETEROL 250-50 MCG/DOSE IN AEPB: 1.0000 | INHALATION_SPRAY | Freq: Two times a day (BID) | RESPIRATORY_TRACT | 3 refills | Status: DC

## 2017-11-16 MED ORDER — FLUTICASONE PROPIONATE 50 MCG/ACT NA SUSP: 2.0000 | Freq: Every day | NASAL | 1 refills | Status: DC

## 2017-11-16 MED ORDER — ROPINIROLE HCL ER 2 MG PO TB24: 2.0000 mg | ORAL_TABLET | Freq: Every day | ORAL | 2 refills | Status: DC

## 2017-11-16 NOTE — Progress Notes (Signed)
Subjective:  Patient ID: Sylvia Hall, female    DOB: 1960/04/27  Age: 57 y.o. MRN: [id number]  CC: Back Pain and Cough   HPI ARNA LUIS s a 57 year old female with a history of  Asthma, chronic neck and lower back pain with associated radiculopathy (MRI of the c-spine and L spine from an outside hospital from 12/2006   revealed mild degenerative changes, annular bulges at L2-3,3-4,4-5 and neural foramina narrowing.C-spine reveals C3-4, 4-5 foraminal narrowing) I had referred her to physical medicine and rehab where she saw Dr Allena Katz and the dose of her Gabapentin increased and Cymbalta added. She complained of pedal edema and feeling drunk on the new dose and had to revert back to her old Gabapentin dose with resolution of  symptoms. She has an upcoming appointment with him later this week. She is s/p left carpal tunnel release surgery last week and is currently in a cast. She is still in pain. She will also need surgery on her right but is holding off for now as she is right hand dominant and would have no help at home given her boyfriend who is an amputee is sick..  Over the last few days she has developed sinus pressure worse with bending down,cough worse at night,post nasal drips and had noticed some chills but no fever, headaches. She does have body aches as she states she is always in pain. Denies dyspnea,wheexzing,chest pains.  Also complains of insomnia. Goes to sleep sometimes by 3am and wakes up intermittently. She is tired during the day as a result. Denies using caffeine products but she has not been as active since her surgery and sits at home watching Netflix. She does not nap during the day.  Past Medical History:  Diagnosis Date  . Anxiety   . Arthritis   . Asthma   . Back pain, chronic   . Bilateral carpal tunnel syndrome   . Depression   . DVT (deep vein thrombosis) in pregnancy   . Environmental allergies   . Epilepsy (HCC)   . Fatigue   . GERD  (gastroesophageal reflux disease)   . Headache    sinus  . History of kidney stones   . Mitral valve prolapse   . Palpitations   . Pericarditis   . Pneumonia   . PTSD (post-traumatic stress disorder)   . Stenosis of lumbosacral spine    numbness L hand and L leg  . Thrombophlebitis    Age 61  . Wears glasses     Past Surgical History:  Procedure Laterality Date  . ABDOMINAL HYSTERECTOMY     partial  . APPENDECTOMY    . BIOPSY BREAST     Right breast x 10  . BREAST LUMPECTOMY     Right breast  . CARPAL TUNNEL RELEASE Left 11/10/2017   Procedure: LEFT CARPAL TUNNEL RELEASE;  Surgeon: Cammy Copa, MD;  Location: [facility] OR;  Service: Orthopedics;  Laterality: Left;  . CESAREAN SECTION     x 2  . NOSE SURGERY     s/p trauma  . OVARY SURGERY    . RHINOPLASTY    . TUBAL LIGATION     left tubal  . UTERINE FIBROID SURGERY      Allergies  Allergen Reactions  . Phenytoin Sodium Extended Other (See Comments)    seizures  . Ivp Dye [Iodinated Diagnostic Agents] Hives  . Levofloxacin Hives  . Sulfa Drugs Cross Reactors Hives  . Aspirin Other (See  Comments)    Upset stomach, resolves with GERD meds   . Caffeine Other (See Comments)    Heart races  . Cymbalta [Duloxetine Hcl] Diarrhea and Nausea Only    Dizzy  . Penicillins Rash    Pt called in stating she  Was prescribed Pen VK in ED and developed a generalized raised rash.  She has taken amoxicillin and augmentin since this time and not had any problem  . Pregabalin Rash  . Sodium Nitrate Other (See Comments)    headaches     Outpatient Medications Prior to Visit  Medication Sig Dispense Refill  . HYDROcodone-acetaminophen (NORCO) 5-325 MG tablet Take 1 tablet by mouth every 6 (six) hours as needed for moderate pain. 30 tablet 0  . methocarbamol (ROBAXIN) 500 MG tablet Take 2 tablets (1,000 mg total) by mouth every 8 (eight) hours as needed for muscle spasms. 180 tablet 1  . metoprolol succinate (TOPROL-XL) 50 MG  24 hr tablet Take 1 tablet (50 mg total) by mouth daily. Take with or immediately following a meal. 60 tablet 11  . Vitamin D, Ergocalciferol, (DRISDOL) 50000 units CAPS capsule Take 1 capsule (50,000 Units total) by mouth every 7 (seven) days. 16 capsule 0  . albuterol (PROVENTIL HFA;VENTOLIN HFA) 108 (90 Base) MCG/ACT inhaler Inhale 2 puffs into the lungs every 6 (six) hours as needed for wheezing or shortness of breath. 1 Inhaler 1  . Fluticasone-Salmeterol (ADVAIR) 250-50 MCG/DOSE AEPB Inhale 1 puff into the lungs 2 (two) times daily. 60 each 0  . rOPINIRole (REQUIP XL) 2 MG 24 hr tablet Take 1 tablet (2 mg total) by mouth at bedtime. 30 tablet 2  . gabapentin (NEURONTIN) 600 MG tablet Take 1.5 tablets (900 mg total) by mouth 3 (three) times daily. 135 tablet 1  . lidocaine (LIDODERM) 5 % Place 1 patch onto the skin daily. Remove & Discard patch within 12 hours or as directed by MD (Patient not taking: Reported on 11/16/2017) 30 patch 3  . meloxicam (MOBIC) 15 MG tablet Take 1 tablet (15 mg total) by mouth daily. (Patient not taking: Reported on 10/13/2017) 30 tablet 1  . metoprolol tartrate (LOPRESSOR) 25 MG tablet Take 0.5 tablets (12.5 mg total) by mouth as needed. For palpitations 30 tablet 3   No facility-administered medications prior to visit.     ROS Review of Systems  Constitutional: Negative for activity change, appetite change and fatigue.  HENT: Positive for postnasal drip and sinus pressure. Negative for congestion and sore throat.   Eyes: Negative for visual disturbance.  Respiratory: Positive for cough. Negative for chest tightness, shortness of breath and wheezing.   Cardiovascular: Negative for chest pain and palpitations.  Gastrointestinal: Negative for abdominal distention, abdominal pain and constipation.  Endocrine: Negative for polydipsia.  Genitourinary: Negative for dysuria and frequency.  Musculoskeletal:       See hpi  Skin: Negative for rash.  Neurological:  Negative for tremors, light-headedness and numbness.  Hematological: Does not bruise/bleed easily.  Psychiatric/Behavioral: Negative for agitation and behavioral problems.    Objective:  BP 108/76   Pulse 69   Temp 98 F (36.7 C) (Oral)   Ht 5\' 3"  (1.6 m)   Wt 192 lb 3.2 oz (87.2 kg)   SpO2 98%   BMI 34.05 kg/m   BP/Weight 11/16/2017 11/10/2017 10/16/2017  Systolic BP 108 124 -  Diastolic BP 76 70 -  Wt. (Lbs) 192.2 180 180  BMI 34.05 31.63 31.89  Some encounter information is confidential and  restricted. Go to Review Flowsheets activity to see all data.      Physical Exam  Constitutional: She is oriented to person, place, and time. She appears well-developed and well-nourished.  HENT:  Right Ear: External ear normal.  Left Ear: External ear normal.  Mouth/Throat: Oropharynx is clear and moist.  Fontal and maxillary sinus TTP  Cardiovascular: Normal rate, normal heart sounds and intact distal pulses.  No murmur heard. Pulmonary/Chest: Effort normal and breath sounds normal. She has no wheezes. She has no rales. She exhibits no tenderness.  Abdominal: Soft. Bowel sounds are normal. She exhibits no distension and no mass. There is no tenderness.  Musculoskeletal: Normal range of motion.  Left fore arm in a cast  Neurological: She is alert and oriented to person, place, and time.    The 10-year ASCVD risk score Denman George DC Montez Hageman., et al., 2013) is: 3.9%   Values used to calculate the score:     Age: 17 years     Sex: Female     Is Non-Hispanic African American: No     Diabetic: No     Tobacco smoker: Yes     Systolic Blood Pressure: 108 mmHg     Is BP treated: No     HDL Cholesterol: 62 mg/dL     Total Cholesterol: 214 mg/dL   Assessment & Plan:   1. Restless leg syndrome Stable - rOPINIRole (REQUIP XL) 2 MG 24 hr tablet; Take 1 tablet (2 mg total) by mouth at bedtime.  Dispense: 30 tablet; Refill: 2  2. Mild intermittent asthma without complication No recent  exacerbation - Fluticasone-Salmeterol (ADVAIR) 250-50 MCG/DOSE AEPB; Inhale 1 puff into the lungs 2 (two) times daily.  Dispense: 60 each; Refill: 3 - albuterol (PROVENTIL HFA;VENTOLIN HFA) 108 (90 Base) MCG/ACT inhaler; Inhale 2 puffs into the lungs every 6 (six) hours as needed for wheezing or shortness of breath.  Dispense: 1 Inhaler; Refill: 1  3. Acute non-recurrent frontal sinusitis No antibiotics indicated - fluticasone (FLONASE) 50 MCG/ACT nasal spray; Place 2 sprays into both nostrils daily.  Dispense: 16 g; Refill: 1 - cetirizine (ZYRTEC) 10 MG tablet; Take 1 tablet (10 mg total) by mouth daily.  Dispense: 30 tablet; Refill: 1  4. Degenerative disc disease, lumbar Uncontrolled Unable to tolerate high doses of Gabapentin Currently seeing Pain management  5. Vitamin D deficiency Completed course of replacement - VITAMIN D 25 Hydroxy (Vit-D Deficiency, Fractures)  6. Bilateral carpal tunnel syndrome S/p left release surgery Would like to hold off on right for now  6. Hypercholesterolemia ASCVD risk of 3.4%  She does smoke and states she was on a keto diet at the time labs were drawn. She will work on a low cholesterol diet and lifestyle modification and we will repeat labs at her next visit  7. Insomnia Sleep hygiene discussed,use of melatonin Will initiate Trazodone if still uncontrolled  Meds ordered this encounter  Medications  . fluticasone (FLONASE) 50 MCG/ACT nasal spray    Sig: Place 2 sprays into both nostrils daily.    Dispense:  16 g    Refill:  1  . rOPINIRole (REQUIP XL) 2 MG 24 hr tablet    Sig: Take 1 tablet (2 mg total) by mouth at bedtime.    Dispense:  30 tablet    Refill:  2  . Fluticasone-Salmeterol (ADVAIR) 250-50 MCG/DOSE AEPB    Sig: Inhale 1 puff into the lungs 2 (two) times daily.    Dispense:  60 each  Refill:  3  . albuterol (PROVENTIL HFA;VENTOLIN HFA) 108 (90 Base) MCG/ACT inhaler    Sig: Inhale 2 puffs into the lungs every 6 (six)  hours as needed for wheezing or shortness of breath.    Dispense:  1 Inhaler    Refill:  1  . cetirizine (ZYRTEC) 10 MG tablet    Sig: Take 1 tablet (10 mg total) by mouth daily.    Dispense:  30 tablet    Refill:  1    Follow-up: Return in about 3 months (around 02/16/2018) for follow up of chronic medical conditions.   Hoy Register MD

## 2017-11-16 NOTE — Patient Instructions (Signed)

## 2017-11-17 ENCOUNTER — Encounter: Payer: Self-pay | Admitting: Family Medicine

## 2017-11-17 LAB — VITAMIN D 25 HYDROXY (VIT D DEFICIENCY, FRACTURES): Vit D, 25-Hydroxy: 18 ng/mL — ABNORMAL LOW (ref 30.0–100.0)

## 2017-11-17 MED ORDER — VITAMIN D (ERGOCALCIFEROL) 1.25 MG (50000 UNIT) PO CAPS: 50000.0000 [IU] | ORAL_CAPSULE | ORAL | 0 refills | Status: DC

## 2017-11-17 MED FILL — rOPINIRole HCL ER 2 MG TB24: 2 | 30 days supply | Qty: 30 | Fill #0

## 2017-11-18 ENCOUNTER — Encounter: Payer: No Typology Code available for payment source | Admitting: Physical Medicine & Rehabilitation

## 2017-11-18 ENCOUNTER — Encounter (INDEPENDENT_AMBULATORY_CARE_PROVIDER_SITE_OTHER): Payer: Self-pay | Admitting: Orthopedic Surgery

## 2017-11-18 ENCOUNTER — Ambulatory Visit (INDEPENDENT_AMBULATORY_CARE_PROVIDER_SITE_OTHER): Payer: Self-pay | Admitting: Orthopedic Surgery

## 2017-11-18 DIAGNOSIS — G5602 Carpal tunnel syndrome, left upper limb: Secondary | ICD-10-CM

## 2017-11-18 MED ORDER — HYDROCODONE-ACETAMINOPHEN 5-325 MG PO TABS: ORAL_TABLET | ORAL | 0 refills | Status: DC

## 2017-11-18 NOTE — Progress Notes (Signed)
Post-Op Visit Note   Patient: Sylvia Hall           Date of Birth: [birth year]           MRN: [id number] Visit Date: 11/18/2017 PCP: Hoy Register, MD   Assessment & Plan:  Chief Complaint:  Chief Complaint  Patient presents with  . Left Hand - Routine Post Op   Visit Diagnoses: No diagnosis found.  Plan: [name] is a patient with left wrist pain status post carpal tunnel release a week ago.  On exam incision is intact.  Sutures are not quite ready to come out yet.  Abductor pollicis brevis is functional.  Plan is to refill Norco to see her back on Monday for suture removal.  Soft dressing applied today.  Follow-Up Instructions: No follow-ups on file.   Orders:  No orders of the defined types were placed in this encounter.  Meds ordered this encounter  Medications  . HYDROcodone-acetaminophen (NORCO/VICODIN) 5-325 MG tablet    Sig: 1 po q 8 hrs prn pain    Dispense:  30 tablet    Refill:  0    Imaging: No results found.  PMFS History: Patient Active Problem List   Diagnosis Date Noted  . Chronic pain syndrome 10/07/2017  . Degenerative disc disease, lumbar 08/17/2017  . Hyperlipidemia 08/17/2017  . Left sided numbness 12/01/2013  . Acute sinusitis, unspecified 12/08/2012  . Muscle spasm of right leg 12/08/2012  . Acute upper respiratory infections of unspecified site 12/08/2012  . Severe episode of recurrent major depressive disorder (HCC) 12/07/2012  . Borderline personality disorder (HCC) 10/19/2012  . Acute posttraumatic stress disorder 08/24/2012  . Palpitations 08/16/2012  . Neuropathy 08/16/2012  . Restless leg syndrome 08/16/2012  . Routine gynecological examination 07/31/2012  . Neurogenic pain 04/12/2012  . Anxiety 11/03/2011  . Vitamin D deficiency 08/08/2011  . Chronic back pain 05/29/2011  . MVP (mitral valve prolapse) 05/29/2011  . Asthma 05/29/2011  . Tobacco user 05/29/2011  . Chronic mastitis 05/29/2011  . Allergic rhinitis 05/29/2011    Past Medical History:  Diagnosis Date  . Anxiety   . Arthritis   . Asthma   . Back pain, chronic   . Bilateral carpal tunnel syndrome   . Depression   . DVT (deep vein thrombosis) in pregnancy   . Environmental allergies   . Epilepsy (HCC)   . Fatigue   . GERD (gastroesophageal reflux disease)   . Headache    sinus  . History of kidney stones   . Mitral valve prolapse   . Palpitations   . Pericarditis   . Pneumonia   . PTSD (post-traumatic stress disorder)   . Stenosis of lumbosacral spine    numbness L hand and L leg  . Thrombophlebitis    Age 40  . Wears glasses     Family History  Problem Relation Age of Onset  . Diabetes Mother   . Mitral valve prolapse Mother   . Asthma Mother   . CAD Mother 93  . Anxiety disorder Mother   . Mitral valve prolapse Sister   . Asthma Sister   . Mitral valve prolapse Son   . Alcohol abuse Son   . Diabetes Maternal Uncle   . Diabetes Maternal Grandmother   . Mitral valve prolapse Sister   . CAD Son 20       No details available  . Alcohol abuse Father   . Alcohol abuse Brother     Past Surgical  History:  Procedure Laterality Date  . ABDOMINAL HYSTERECTOMY     partial  . APPENDECTOMY    . BIOPSY BREAST     Right breast x 10  . BREAST LUMPECTOMY     Right breast  . CARPAL TUNNEL RELEASE Left 11/10/2017   Procedure: LEFT CARPAL TUNNEL RELEASE;  Surgeon: Cammy Copa, MD;  Location: [facility] OR;  Service: Orthopedics;  Laterality: Left;  . CESAREAN SECTION     x 2  . NOSE SURGERY     s/p trauma  . OVARY SURGERY    . RHINOPLASTY    . TUBAL LIGATION     left tubal  . UTERINE FIBROID SURGERY     Social History   Occupational History  . Not on file  Tobacco Use  . Smoking status: Current Every Day Smoker    Packs/day: 1.00    Years: 12.00    Pack years: 12.00    Types: Cigarettes  . Smokeless tobacco: Never Used  Substance and Sexual Activity  . Alcohol use: No    Alcohol/week: 0.0 standard drinks  . Drug  use: No  . Sexual activity: Not Currently    Birth control/protection: None

## 2017-11-21 ENCOUNTER — Ambulatory Visit (INDEPENDENT_AMBULATORY_CARE_PROVIDER_SITE_OTHER): Payer: Self-pay | Admitting: Orthopedic Surgery

## 2017-11-21 ENCOUNTER — Encounter (INDEPENDENT_AMBULATORY_CARE_PROVIDER_SITE_OTHER): Payer: Self-pay | Admitting: Orthopedic Surgery

## 2017-11-21 DIAGNOSIS — G5602 Carpal tunnel syndrome, left upper limb: Secondary | ICD-10-CM

## 2017-11-21 NOTE — Progress Notes (Signed)
Post-Op Visit Note   Patient: Sylvia Hall           Date of Birth: 1961-01-05           MRN: [id number] Visit Date: 11/21/2017 PCP: Hoy Register, MD   Assessment & Plan:  Chief Complaint:  Chief Complaint  Patient presents with  . Left Wrist - Routine Post Op   Visit Diagnoses:  1. Carpal tunnel syndrome, left upper limb     Plan: Sylvia Hall presents for suture removal following left carpal tunnel release.  Incision is intact.  I want her to continue to work on range of motion of the hand and wrist.  No lifting.  Come back in 4 weeks for final check  Follow-Up Instructions: Return in about 4 weeks (around 12/19/2017).   Orders:  No orders of the defined types were placed in this encounter.  No orders of the defined types were placed in this encounter.   Imaging: No results found.  PMFS History: Patient Active Problem List   Diagnosis Date Noted  . Chronic pain syndrome 10/07/2017  . Degenerative disc disease, lumbar 08/17/2017  . Hyperlipidemia 08/17/2017  . Left sided numbness 12/01/2013  . Acute sinusitis, unspecified 12/08/2012  . Muscle spasm of right leg 12/08/2012  . Acute upper respiratory infections of unspecified site 12/08/2012  . Severe episode of recurrent major depressive disorder (HCC) 12/07/2012  . Borderline personality disorder (HCC) 10/19/2012  . Acute posttraumatic stress disorder 08/24/2012  . Palpitations 08/16/2012  . Neuropathy 08/16/2012  . Restless leg syndrome 08/16/2012  . Routine gynecological examination 07/31/2012  . Neurogenic pain 04/12/2012  . Anxiety 11/03/2011  . Vitamin D deficiency 08/08/2011  . Chronic back pain 05/29/2011  . MVP (mitral valve prolapse) 05/29/2011  . Asthma 05/29/2011  . Tobacco user 05/29/2011  . Chronic mastitis 05/29/2011  . Allergic rhinitis 05/29/2011   Past Medical History:  Diagnosis Date  . Anxiety   . Arthritis   . Asthma   . Back pain, chronic   . Bilateral carpal tunnel syndrome     . Depression   . DVT (deep vein thrombosis) in pregnancy   . Environmental allergies   . Epilepsy (HCC)   . Fatigue   . GERD (gastroesophageal reflux disease)   . Headache    sinus  . History of kidney stones   . Mitral valve prolapse   . Palpitations   . Pericarditis   . Pneumonia   . PTSD (post-traumatic stress disorder)   . Stenosis of lumbosacral spine    numbness L hand and L leg  . Thrombophlebitis    Age 57  . Wears glasses     Family History  Problem Relation Age of Onset  . Diabetes Mother   . Mitral valve prolapse Mother   . Asthma Mother   . CAD Mother 73  . Anxiety disorder Mother   . Mitral valve prolapse Sister   . Asthma Sister   . Mitral valve prolapse Son   . Alcohol abuse Son   . Diabetes Maternal Uncle   . Diabetes Maternal Grandmother   . Mitral valve prolapse Sister   . CAD Son 65       No details available  . Alcohol abuse Father   . Alcohol abuse Brother     Past Surgical History:  Procedure Laterality Date  . ABDOMINAL HYSTERECTOMY     partial  . APPENDECTOMY    . BIOPSY BREAST     Right breast x  10  . BREAST LUMPECTOMY     Right breast  . CARPAL TUNNEL RELEASE Left 11/10/2017   Procedure: LEFT CARPAL TUNNEL RELEASE;  Surgeon: Cammy Copa, MD;  Location: [facility] OR;  Service: Orthopedics;  Laterality: Left;  . CESAREAN SECTION     x 2  . NOSE SURGERY     s/p trauma  . OVARY SURGERY    . RHINOPLASTY    . TUBAL LIGATION     left tubal  . UTERINE FIBROID SURGERY     Social History   Occupational History  . Not on file  Tobacco Use  . Smoking status: Current Every Day Smoker    Packs/day: 1.00    Years: 12.00    Pack years: 12.00    Types: Cigarettes  . Smokeless tobacco: Never Used  Substance and Sexual Activity  . Alcohol use: No    Alcohol/week: 0.0 standard drinks  . Drug use: No  . Sexual activity: Not Currently    Birth control/protection: None

## 2017-11-28 MED FILL — METHOCARBAMOL 500 MG TABS: 500 | 30 days supply | Qty: 180 | Fill #1

## 2017-12-05 ENCOUNTER — Telehealth (INDEPENDENT_AMBULATORY_CARE_PROVIDER_SITE_OTHER): Payer: Self-pay | Admitting: Orthopedic Surgery

## 2017-12-05 NOTE — Telephone Encounter (Signed)
Hold off on ointment until 4 weeks postop

## 2017-12-05 NOTE — Telephone Encounter (Signed)
Please advise. Carpal tunnel release on 11/10/17.

## 2017-12-05 NOTE — Telephone Encounter (Signed)
Patient is wondering if she can use an ointment or cream over her incision, she said it is dry, scaly, and peeling. Also, she is wondering if she should still be having a lot of pain in her left wrist? She is experiencing sharp and shooting pains that keep her up at night sometimes. She takes advil and tylenol for this. Please advise # [phone]

## 2017-12-06 ENCOUNTER — Telehealth: Payer: Self-pay | Admitting: Family Medicine

## 2017-12-06 NOTE — Telephone Encounter (Signed)
3 mos is as long as I have seen

## 2017-12-06 NOTE — Telephone Encounter (Signed)
Patient wants to know how much longer she should anticipate sharp, shooting pains in her wrist?

## 2017-12-06 NOTE — Telephone Encounter (Signed)
Patient called to get an updated referral for dental. Please follow up with patient.

## 2017-12-07 NOTE — Telephone Encounter (Signed)
I spoke to patient and I told her that her dental referral was faxed 12-06-17 to [facility]   Thank you

## 2017-12-07 NOTE — Telephone Encounter (Signed)
IC s/w patient and advised  

## 2017-12-13 MED FILL — METOPROLOL SUCCINATE ER 50: 50 | 30 days supply | Qty: 30 | Fill #8

## 2017-12-14 ENCOUNTER — Ambulatory Visit: Payer: Self-pay | Admitting: Physical Medicine & Rehabilitation

## 2017-12-19 ENCOUNTER — Ambulatory Visit (INDEPENDENT_AMBULATORY_CARE_PROVIDER_SITE_OTHER): Payer: Self-pay | Admitting: Orthopedic Surgery

## 2017-12-19 ENCOUNTER — Encounter (INDEPENDENT_AMBULATORY_CARE_PROVIDER_SITE_OTHER): Payer: Self-pay | Admitting: Orthopedic Surgery

## 2017-12-19 DIAGNOSIS — G5602 Carpal tunnel syndrome, left upper limb: Secondary | ICD-10-CM

## 2017-12-19 MED ORDER — ACETAMINOPHEN-CODEINE #3 300-30 MG PO TABS: ORAL_TABLET | ORAL | 0 refills | Status: DC

## 2017-12-19 MED FILL — rOPINIRole HCL ER 2 MG TB24: 2 | 30 days supply | Qty: 30 | Fill #1

## 2017-12-19 NOTE — Progress Notes (Signed)
Post-Op Visit Note   Patient: Sylvia Hall           Date of Birth: 1960-04-08           MRN: [id number] Visit Date: 12/19/2017 PCP: Hoy Register, MD   Assessment & Plan:  Chief Complaint:  Chief Complaint  Patient presents with  . Left Wrist - Follow-up   Visit Diagnoses:  1. Carpal tunnel syndrome, left upper limb     Plan: 1 is a patient with left wrist pain following carpal tunnel release about 5 weeks ago.  She had severe carpal tunnel syndrome.  Having some sharp shooting pains in the wrist and fingers.  Not too surprising that she still having symptoms at this time based on the severity of her preoperative nerve study.  Taking naproxen as needed.  On exam she still has abductor pollicis brevis strength intact but there is some wasting.  Grip strength is improving.  I think she needs a little scar desensitization with physical therapy.  Work on range of motion and strengthening also 6-week return.  Tylenol 3 prescribed 1 time only.  Follow-Up Instructions: Return in about 6 weeks (around 01/30/2018).   Orders:  Orders Placed This Encounter  Procedures  . Ambulatory referral to Physical Therapy   Meds ordered this encounter  Medications  . acetaminophen-codeine (TYLENOL #3) 300-30 MG tablet    Sig: 1 po q 12hrs prn pain    Dispense:  45 tablet    Refill:  0    Imaging: No results found.  PMFS History: Patient Active Problem List   Diagnosis Date Noted  . Chronic pain syndrome 10/07/2017  . Degenerative disc disease, lumbar 08/17/2017  . Hyperlipidemia 08/17/2017  . Left sided numbness 12/01/2013  . Acute sinusitis, unspecified 12/08/2012  . Muscle spasm of right leg 12/08/2012  . Acute upper respiratory infections of unspecified site 12/08/2012  . Severe episode of recurrent major depressive disorder (HCC) 12/07/2012  . Borderline personality disorder (HCC) 10/19/2012  . Acute posttraumatic stress disorder 08/24/2012  . Palpitations 08/16/2012  .  Neuropathy 08/16/2012  . Restless leg syndrome 08/16/2012  . Routine gynecological examination 07/31/2012  . Neurogenic pain 04/12/2012  . Anxiety 11/03/2011  . Vitamin D deficiency 08/08/2011  . Chronic back pain 05/29/2011  . MVP (mitral valve prolapse) 05/29/2011  . Asthma 05/29/2011  . Tobacco user 05/29/2011  . Chronic mastitis 05/29/2011  . Allergic rhinitis 05/29/2011   Past Medical History:  Diagnosis Date  . Anxiety   . Arthritis   . Asthma   . Back pain, chronic   . Bilateral carpal tunnel syndrome   . Depression   . DVT (deep vein thrombosis) in pregnancy   . Environmental allergies   . Epilepsy (HCC)   . Fatigue   . GERD (gastroesophageal reflux disease)   . Headache    sinus  . History of kidney stones   . Mitral valve prolapse   . Palpitations   . Pericarditis   . Pneumonia   . PTSD (post-traumatic stress disorder)   . Stenosis of lumbosacral spine    numbness L hand and L leg  . Thrombophlebitis    Age 24  . Wears glasses     Family History  Problem Relation Age of Onset  . Diabetes Mother   . Mitral valve prolapse Mother   . Asthma Mother   . CAD Mother 93  . Anxiety disorder Mother   . Mitral valve prolapse Sister   . Asthma  Sister   . Mitral valve prolapse Son   . Alcohol abuse Son   . Diabetes Maternal Uncle   . Diabetes Maternal Grandmother   . Mitral valve prolapse Sister   . CAD Son 3       No details available  . Alcohol abuse Father   . Alcohol abuse Brother     Past Surgical History:  Procedure Laterality Date  . ABDOMINAL HYSTERECTOMY     partial  . APPENDECTOMY    . BIOPSY BREAST     Right breast x 10  . BREAST LUMPECTOMY     Right breast  . CARPAL TUNNEL RELEASE Left 11/10/2017   Procedure: LEFT CARPAL TUNNEL RELEASE;  Surgeon: Cammy Copa, MD;  Location: [facility] OR;  Service: Orthopedics;  Laterality: Left;  . CESAREAN SECTION     x 2  . NOSE SURGERY     s/p trauma  . OVARY SURGERY    . RHINOPLASTY    . TUBAL  LIGATION     left tubal  . UTERINE FIBROID SURGERY     Social History   Occupational History  . Not on file  Tobacco Use  . Smoking status: Current Every Day Smoker    Packs/day: 1.00    Years: 12.00    Pack years: 12.00    Types: Cigarettes  . Smokeless tobacco: Never Used  Substance and Sexual Activity  . Alcohol use: No    Alcohol/week: 0.0 standard drinks  . Drug use: No  . Sexual activity: Not Currently    Birth control/protection: None

## 2017-12-28 ENCOUNTER — Ambulatory Visit: Payer: No Typology Code available for payment source | Attending: Family Medicine | Admitting: Occupational Therapy

## 2017-12-28 DIAGNOSIS — M25632 Stiffness of left wrist, not elsewhere classified: Secondary | ICD-10-CM

## 2017-12-28 DIAGNOSIS — R278 Other lack of coordination: Secondary | ICD-10-CM

## 2017-12-28 DIAGNOSIS — M6281 Muscle weakness (generalized): Secondary | ICD-10-CM

## 2017-12-28 DIAGNOSIS — M79642 Pain in left hand: Secondary | ICD-10-CM

## 2017-12-28 DIAGNOSIS — M25642 Stiffness of left hand, not elsewhere classified: Secondary | ICD-10-CM

## 2017-12-28 DIAGNOSIS — R208 Other disturbances of skin sensation: Secondary | ICD-10-CM

## 2017-12-28 NOTE — Patient Instructions (Signed)
AROM: Wrist Extension   .  With left palm down, bend wrist up. Repeat __15__ times per set.  Do __2-3__ sessions per day.  Flexor Tendon Gliding (Active Hook Fist)   With fingers and knuckles straight, bend middle and tip joints. Do not bend large knuckles. Repeat _10-15___ times. Do _4-6___ sessions per day.  MP Flexion (Active)   With back of hand on table, bend large knuckles as far as they will go, keeping small joints straight. Repeat _10-15___ times. Do __4-6__ sessions per day. Activity: Reach into a narrow container.*      Finger Flexion / Extension   With palm up, bend fingers of left hand toward palm, making a  fist. Straighten fingers, opening fist. Repeat sequence _10-15___ times per session. Do _4-6__ sessions per day. Hand Variation: Palm down   Copyright  VHI. All rights reserved.        Copyright  VHI. All rights reserved.       .Marland Kitchen

## 2017-12-29 NOTE — Therapy (Signed)
[facility] [address] [place], [place], [address] Phone: [phone]   Fax:  [phone]  Occupational Therapy Evaluation  Patient Details  Name: Sylvia Hall MRN: [id number] Date of Birth: 25-Nov-1960 Referring Provider (OT): Dr. August Saucer   Encounter Date: 12/28/2017  OT End of Session - 12/29/17 1300    Visit Number  1    Number of Visits  17    Date for OT Re-Evaluation  02/27/18    Authorization Type  CAFA    Authorization Time Period  expires 01/21/18    OT Start Time  1535    OT Stop Time  1620    OT Time Calculation (min)  45 min    Activity Tolerance  Patient limited by pain    Behavior During Therapy  [facility] for tasks assessed/performed       Past Medical History:  Diagnosis Date  . Anxiety   . Arthritis   . Asthma   . Back pain, chronic   . Bilateral carpal tunnel syndrome   . Depression   . DVT (deep vein thrombosis) in pregnancy   . Environmental allergies   . Epilepsy (HCC)   . Fatigue   . GERD (gastroesophageal reflux disease)   . Headache    sinus  . History of kidney stones   . Mitral valve prolapse   . Palpitations   . Pericarditis   . Pneumonia   . PTSD (post-traumatic stress disorder)   . Stenosis of lumbosacral spine    numbness L hand and L leg  . Thrombophlebitis    Age 57  . Wears glasses     Past Surgical History:  Procedure Laterality Date  . ABDOMINAL HYSTERECTOMY     partial  . APPENDECTOMY    . BIOPSY BREAST     Right breast x 10  . BREAST LUMPECTOMY     Right breast  . CARPAL TUNNEL RELEASE Left 11/10/2017   Procedure: LEFT CARPAL TUNNEL RELEASE;  Surgeon: Cammy Copa, MD;  Location: [facility] OR;  Service: Orthopedics;  Laterality: Left;  . CESAREAN SECTION     x 2  . NOSE SURGERY     s/p trauma  . OVARY SURGERY    . RHINOPLASTY    . TUBAL LIGATION     left tubal  . UTERINE FIBROID SURGERY      There were no vitals filed for this visit.  Subjective  Assessment - 12/28/17 1537    Subjective   Pt reports pain today in LUE    Pertinent History  Carpal tunnel release LUE    Patient Stated Goals  to regain mobility and grip in left hand    Currently in Pain?  Yes    Pain Score  6     Pain Location  Hand    Pain Orientation  Left    Pain Descriptors / Indicators  Burning;Aching    Pain Type  Acute pain    Pain Onset  More than a month ago    Pain Frequency  Constant    Aggravating Factors   use    Pain Relieving Factors  tylenol    Multiple Pain Sites  No        [facility] OT Assessment - 12/29/17 0001      Assessment   Medical Diagnosis  Carpal tunnel release LUE    Referring Provider (OT)  Dr. 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, Press photographer for disability   Leisure  watch Netflix      ADL   Eating/Feeding  Modified independent    Grooming  Minimal assistance   styling/ brushing hair- needs A   Upper Body Bathing  Modified independent    Lower Body Bathing  Modified independent    Upper Body Dressing  Needs assist for fasteners   able to donn shirt needs A for buttons   Lower Body Dressing  Needs assist for fasteners   assist tying shoes   Toilet Transfer  Modified independent    Tub/Shower Transfer  Modified independent    ADL comments  Pt reports difficulty: styling her hair, washing dishes, retrieving itemsfrom cabinets      IADL   Shopping  Assistance for transportation    Light Housekeeping  --   able to wipe countertops, sweep, and vacuum,    Meal Prep  Needs to have meals prepared and served      Mobility   Mobility Status  Independent      Written Expression   Dominant Hand  Right       Cognition   Overall Cognitive Status  Within Functional Limits for tasks assessed      Sensation   Light Touch  Impaired by gross assessment   bilareal UE's     Coordination   Fine Motor Movements are Fluid and Coordinated  No    9 Hole Peg Test  Right;Left    Right 9 Hole Peg Test   31.18 secs    Left 9 Hole Peg Test  57.25 secs      Edema   Edema  moderate at incision, left palmar incision is closed and appears to be healing well.      ROM / Strength   AROM / PROM / Strength  AROM      AROM   Overall AROM   Deficits;Due to pain    Overall AROM Comments  LUE: Pt demonstrates grossly 95% composite finger flexion, difficulty opposing 5th digit with thumb, wrist flexion/ extension:35/35      Hand Function   Right Hand Grip (lbs)  30    Right Hand Lateral Pinch  12 lbs    Right Hand 3 Point Pinch  6 lbs   tip 2   Left Hand Grip (lbs)  5    Left Hand Lateral Pinch  8 lbs    Left 3 point pinch  4 lbs   tip pinch 2                       OT Short Term Goals - 12/29/17 1308      OT SHORT TERM GOAL #1   Title  I with inital HEP    Time  4    Period  Weeks    Status  New    Target Date  01/28/18      OT SHORT TERM GOAL #2   Title  Pt will report pain less than or equal to 4/10 for ADLS.    Time  4  Period  Weeks    Status  New      OT SHORT TERM GOAL #3   Title  I with scar massage    Time  4    Period  Weeks    Status  New      OT SHORT TERM GOAL #4   Title  Pt will demonstrate LUE wrist flexion/ extension WFLS for ADLs/IADLS.    Time  4    Period  Weeks    Status  New      OT SHORT TERM GOAL #5   Title  I with all basic ADLs    Time  4    Period  Weeks    Status  New        OT Long Term Goals - 12/29/17 1310      OT LONG TERM GOAL #1   Title  I with updated HEP.    Time  8    Status  New    Target Date  02/27/18      OT LONG TERM GOAL #2   Title  Pt will demonstrate grip strength of at least 25 lbs for increased LUE functional  use during ADLs.    Time  8    Period  Weeks    Status  New      OT LONG TERM GOAL #3   Title  Pt will demonstrate improved fine motor coordination for ADLS as evidenced by decreasing 9 hole peg test by 10 secs    Baseline  RUE 31.18, LUE 57.25 secs    Time  8    Period  Weeks    Status  New      OT LONG TERM GOAL #4   Title  Pt will report improved ease with brushing hair, fastening buttons, and washing dishes using LUE.    Time  8    Period  Weeks    Status  New      OT LONG TERM GOAL #5   Title  Pt will perform home management and basic cooking modified independently    Time  8    Period  Weeks            Plan - 12/29/17 1637    Clinical Impression Statement  Pt is a 57 y.o s/p left carpal tunnel release on 11/10/17 by Dr. Cammy CopaGregory Scott Dean presents to occupational therapy with the following deficits: decreased strength, decreased coordination, pain, decreased LUE functional use and sensory impairment which impedes performance of ADLS/IADLS. Pt can benefit from skilled occupational therapy to maximize pt's independence with daily actiivities.    Occupational Profile and client history currently impacting functional performance  Pt was independent prior to surgery, she currntly lives with her significant other who recently had an AKA. PMH: s/p L CTR 11/10/17, Hx of DJD, bilateral carpal tunnel, deperession, borderline peronality d/o, PTSD,     Occupational performance deficits (Please refer to evaluation for details):  ADL's;IADL's;Leisure;Play;Social Participation    Rehab Potential  Good    Current Impairments/barriers affecting progress:  pain, sensory impairment    OT Frequency  2x / week    OT Duration  8 weeks    OT Treatment/Interventions  Self-care/ADL training;Therapeutic exercise;Patient/family education;Splinting;Neuromuscular education;Paraffin;Moist Heat;Fluidtherapy;Energy conservation;Scar mobilization;Therapeutic activities;Cognitive  remediation/compensation;Passive range of motion;Manual Therapy;DME and/or AE instruction;Contrast Bath;Ultrasound;Cryotherapy    Plan  progress HEP, US vs. fluidotherapy    Clinical Decision Making  Limited treatment options, no task modification necessary    OT Home  Exercise Plan  tendon gliding, scar massage    Consulted and Agree with Plan of Care  Patient       Patient will benefit from skilled therapeutic intervention in order to improve the following deficits and impairments:  Increased edema, Impaired flexibility, Pain, Impaired sensation, Decreased coordination, Decreased activity tolerance, Decreased endurance, Decreased range of motion, Decreased strength, Impaired UE functional use, Impaired perceived functional ability, Decreased safety awareness, Decreased knowledge of precautions  Visit Diagnosis: Pain in left hand  Other lack of coordination  Muscle weakness (generalized)  Stiffness of left wrist, not elsewhere classified  Stiffness of left hand, not elsewhere classified  Other disturbances of skin sensation    Problem List Patient Active Problem List   Diagnosis Date Noted  . Chronic pain syndrome 10/07/2017  . Degenerative disc disease, lumbar 08/17/2017  . Hyperlipidemia 08/17/2017  . Left sided numbness 12/01/2013  . Acute sinusitis, unspecified 12/08/2012  . Muscle spasm of right leg 12/08/2012  . Acute upper respiratory infections of unspecified site 12/08/2012  . Severe episode of recurrent major depressive disorder (HCC) 12/07/2012  . Borderline personality disorder (HCC) 10/19/2012  . Acute posttraumatic stress disorder 08/24/2012  . Palpitations 08/16/2012  . Neuropathy 08/16/2012  . Restless leg syndrome 08/16/2012  . Routine gynecological examination 07/31/2012  . Neurogenic pain 04/12/2012  . Anxiety 11/03/2011  . Vitamin D deficiency 08/08/2011  . Chronic back pain 05/29/2011  . MVP (mitral valve prolapse) 05/29/2011  . Asthma 05/29/2011   . Tobacco user 05/29/2011  . Chronic mastitis 05/29/2011  . Allergic rhinitis 05/29/2011    Ramiya Delahunty 12/29/2017, 4:54 PM [facility], OTR/L [phone] Phone: [phone] 4:59 PM 12/29/17 [facility] [address] [place], [place], [address] Phone: [phone]   Fax:  [phone]  Name: LAVERN CRIMI MRN: [id number] Date of Birth: Nov 19, 1960

## 2018-01-04 ENCOUNTER — Other Ambulatory Visit: Payer: Self-pay

## 2018-01-04 ENCOUNTER — Encounter: Payer: Self-pay | Admitting: Physical Medicine & Rehabilitation

## 2018-01-04 ENCOUNTER — Encounter
Payer: No Typology Code available for payment source | Attending: Physical Medicine & Rehabilitation | Admitting: Physical Medicine & Rehabilitation

## 2018-01-04 VITALS — BP 117/82 | HR 70 | Ht 63.0 in | Wt 195.0 lb

## 2018-01-04 DIAGNOSIS — Z8249 Family history of ischemic heart disease and other diseases of the circulatory system: Secondary | ICD-10-CM | POA: Insufficient documentation

## 2018-01-04 DIAGNOSIS — M542 Cervicalgia: Secondary | ICD-10-CM | POA: Insufficient documentation

## 2018-01-04 DIAGNOSIS — G5603 Carpal tunnel syndrome, bilateral upper limbs: Secondary | ICD-10-CM

## 2018-01-04 DIAGNOSIS — Z720 Tobacco use: Secondary | ICD-10-CM

## 2018-01-04 DIAGNOSIS — M48061 Spinal stenosis, lumbar region without neurogenic claudication: Secondary | ICD-10-CM | POA: Insufficient documentation

## 2018-01-04 DIAGNOSIS — G479 Sleep disorder, unspecified: Secondary | ICD-10-CM

## 2018-01-04 DIAGNOSIS — Z6832 Body mass index (BMI) 32.0-32.9, adult: Secondary | ICD-10-CM | POA: Insufficient documentation

## 2018-01-04 DIAGNOSIS — Z818 Family history of other mental and behavioral disorders: Secondary | ICD-10-CM | POA: Insufficient documentation

## 2018-01-04 DIAGNOSIS — G40909 Epilepsy, unspecified, not intractable, without status epilepticus: Secondary | ICD-10-CM | POA: Insufficient documentation

## 2018-01-04 DIAGNOSIS — F4310 Post-traumatic stress disorder, unspecified: Secondary | ICD-10-CM | POA: Insufficient documentation

## 2018-01-04 DIAGNOSIS — G894 Chronic pain syndrome: Secondary | ICD-10-CM

## 2018-01-04 DIAGNOSIS — I341 Nonrheumatic mitral (valve) prolapse: Secondary | ICD-10-CM | POA: Insufficient documentation

## 2018-01-04 DIAGNOSIS — E559 Vitamin D deficiency, unspecified: Secondary | ICD-10-CM

## 2018-01-04 DIAGNOSIS — F329 Major depressive disorder, single episode, unspecified: Secondary | ICD-10-CM | POA: Insufficient documentation

## 2018-01-04 DIAGNOSIS — M791 Myalgia, unspecified site: Secondary | ICD-10-CM

## 2018-01-04 DIAGNOSIS — M5136 Other intervertebral disc degeneration, lumbar region: Secondary | ICD-10-CM

## 2018-01-04 DIAGNOSIS — J45909 Unspecified asthma, uncomplicated: Secondary | ICD-10-CM | POA: Insufficient documentation

## 2018-01-04 DIAGNOSIS — M792 Neuralgia and neuritis, unspecified: Secondary | ICD-10-CM

## 2018-01-04 DIAGNOSIS — F17210 Nicotine dependence, cigarettes, uncomplicated: Secondary | ICD-10-CM | POA: Insufficient documentation

## 2018-01-04 DIAGNOSIS — M545 Low back pain: Secondary | ICD-10-CM | POA: Insufficient documentation

## 2018-01-04 DIAGNOSIS — Z825 Family history of asthma and other chronic lower respiratory diseases: Secondary | ICD-10-CM | POA: Insufficient documentation

## 2018-01-04 DIAGNOSIS — R202 Paresthesia of skin: Secondary | ICD-10-CM

## 2018-01-04 DIAGNOSIS — F332 Major depressive disorder, recurrent severe without psychotic features: Secondary | ICD-10-CM

## 2018-01-04 DIAGNOSIS — Z8672 Personal history of thrombophlebitis: Secondary | ICD-10-CM | POA: Insufficient documentation

## 2018-01-04 DIAGNOSIS — E669 Obesity, unspecified: Secondary | ICD-10-CM | POA: Insufficient documentation

## 2018-01-04 DIAGNOSIS — F419 Anxiety disorder, unspecified: Secondary | ICD-10-CM

## 2018-01-04 DIAGNOSIS — Z833 Family history of diabetes mellitus: Secondary | ICD-10-CM | POA: Insufficient documentation

## 2018-01-04 DIAGNOSIS — Z79899 Other long term (current) drug therapy: Secondary | ICD-10-CM | POA: Insufficient documentation

## 2018-01-04 MED ORDER — AMITRIPTYLINE HCL 10 MG PO TABS: 10.0000 mg | ORAL_TABLET | Freq: Every day | ORAL | 1 refills | Status: DC

## 2018-01-04 MED ORDER — BACLOFEN 10 MG PO TABS: 10.0000 mg | ORAL_TABLET | Freq: Three times a day (TID) | ORAL | 0 refills | Status: DC

## 2018-01-04 MED FILL — BACLOFEN 10 MG TABLET: 10 | 10 days supply | Qty: 30 | Fill #0

## 2018-01-04 MED FILL — AMITRIPTYLINE HCL 10 MG TAB: 10 | 30 days supply | Qty: 30 | Fill #0

## 2018-01-04 NOTE — Progress Notes (Signed)
Subjective:    Patient ID: Sylvia Hall, female    DOB: May 09, 1960, 57 y.o.   MRN: [id number]  HPI 57 y/o female with pmh PTSD, lumbar stenosis, depression/anxiety present with generalized pain > left neck.   Initially stated: Started 2012.  Denies inciting event.  Denies alleviating factors.  Moving exacerbates the pain.  All qualities of pain.  Radiating from hand proximally.  Constant.  Gabapentin, Robaxin, Ibu, Lidocaine patch without benefit.  Associated numbness and weakness.  Pain limits any activity. Denies falls.  Pt recently saw Ortho, notes reviewed, NCS/EMG showing b/l carpal tunnel and lumbar facet arthropathy.   Last clinic visit 10/07/17.  She was asked to return in 4 weeks. Since that time, she had left CTS surgery, notes reviewed. She is in PT. She states she was not able to tolerate Cymbalta. She leg swelling with Gabapentin. She states she did not obtain TENS IT due to cost. She does not notice benefit with Robaxin. She only took Mobic a couple of times. She is scheduled to have injections in her back with Ortho. She states she is "smoking like a freight train".  Pain Inventory Average Pain 6 Pain Right Now 7 My pain is sharp, burning, dull, stabbing, tingling and aching  In the last 24 hours, has pain interfered with the following? General activity 7 Relation with others 7 Enjoyment of life 7 What TIME of day is your pain at its worst? all Sleep (in general) Poor  Pain is worse with: walking, bending, sitting, inactivity, standing and some activites Pain improves with: none Relief from Meds: 0  Mobility walk without assistance ability to climb steps?  yes do you drive?  no  Function not employed: date last employed . I need assistance with the following:  dressing, meal prep, household duties and shopping  Neuro/Psych weakness numbness tingling  Prior Studies Any changes since last visit?  no  Physicians involved in your care Any changes since last  visit?  no   Family History  Problem Relation Age of Onset  . Diabetes Mother   . Mitral valve prolapse Mother   . Asthma Mother   . CAD Mother 20  . Anxiety disorder Mother   . Mitral valve prolapse Sister   . Asthma Sister   . Mitral valve prolapse Son   . Alcohol abuse Son   . Diabetes Maternal Uncle   . Diabetes Maternal Grandmother   . Mitral valve prolapse Sister   . CAD Son 38       No details available  . Alcohol abuse Father   . Alcohol abuse Brother    Social History   Socioeconomic History  . Marital status: Single    Spouse name: Not on file  . Number of children: 2  . Years of education: Not on file  . Highest education level: Not on file  Occupational History  . Not on file  Social Needs  . Financial resource strain: Not on file  . Food insecurity:    Worry: Not on file    Inability: Not on file  . Transportation needs:    Medical: Not on file    Non-medical: Not on file  Tobacco Use  . Smoking status: Current Every Day Smoker    Packs/day: 1.00    Years: 12.00    Pack years: 12.00    Types: Cigarettes  . Smokeless tobacco: Never Used  Substance and Sexual Activity  . Alcohol use: No    Alcohol/week:  0.0 standard drinks  . Drug use: No  . Sexual activity: Not Currently    Birth control/protection: None  Lifestyle  . Physical activity:    Days per week: Not on file    Minutes per session: Not on file  . Stress: Not on file  Relationships  . Social connections:    Talks on phone: Not on file    Gets together: Not on file    Attends religious service: Not on file    Active member of club or organization: Not on file    Attends meetings of clubs or organizations: Not on file    Relationship status: Not on file  Other Topics Concern  . Not on file  Social History Narrative   Lives alone.     Past Surgical History:  Procedure Laterality Date  . ABDOMINAL HYSTERECTOMY     partial  . APPENDECTOMY    . BIOPSY BREAST     Right breast x  10  . BREAST LUMPECTOMY     Right breast  . CARPAL TUNNEL RELEASE Left 11/10/2017   Procedure: LEFT CARPAL TUNNEL RELEASE;  Surgeon: Cammy Copaean, Gregory Scott, MD;  [facility];  Service: Orthopedics;  Laterality: Left;  . CESAREAN SECTION     x 2  . NOSE SURGERY     s/p trauma  . OVARY SURGERY    . RHINOPLASTY    . TUBAL LIGATION     left tubal  . UTERINE FIBROID SURGERY     Past Medical History:  Diagnosis Date  . Anxiety   . Arthritis   . Asthma   . Back pain, chronic   . Bilateral carpal tunnel syndrome   . Depression   . DVT (deep vein thrombosis) in pregnancy   . Environmental allergies   . Epilepsy (HCC)   . Fatigue   . GERD (gastroesophageal reflux disease)   . Headache    sinus  . History of kidney stones   . Mitral valve prolapse   . Palpitations   . Pericarditis   . Pneumonia   . PTSD (post-traumatic stress disorder)   . Stenosis of lumbosacral spine    numbness L hand and L leg  . Thrombophlebitis    Age 57  . Wears glasses    BP 117/82   Pulse 70   Ht 5\' 3"  (1.6 m)   Wt 195 lb (88.5 kg)   SpO2 94%   BMI 34.54 kg/m   Opioid Risk Score:   Fall Risk Score:  `1  Depression screen PHQ 2/9  Dep[facility] 01/04/2018 08/17/2017 06/15/2017 05/18/2017 04/07/2017 02/16/2017 01/12/2017  Decreased Interest 0 0 0 0 0 0 0  Down, Depressed, Hopeless 0 0 0 0 0 0 0  PHQ - 2 Score 0 0 0 0 0 0 0  Altered sleeping - 3 0 0 0 0 3  Tired, decreased energy - 1 0 0 0 0 0  Change in appetite - 0 0 0 0 0 0  Feeling bad or failure about yourself  - 0 0 0 0 0 0  Trouble concentrating - 0 0 0 0 0 0  Moving slowly or fidgety/restless - 0 0 0 0 0 0  Suicidal thoughts - 0 0 0 0 0 0  PHQ-9 Score - 4 0 0 0 0 3     Review of Systems  Constitutional: Positive for chills and fever.  HENT: Negative.   Eyes: Negative.   Respiratory: Positive for cough,  shortness of breath and wheezing.   Cardiovascular: Negative.   Gastrointestinal: Negative.   Endocrine: Negative.     Genitourinary: Negative.   Musculoskeletal: Positive for arthralgias, back pain, gait problem, myalgias, neck pain and neck stiffness.  Skin: Negative.   Allergic/Immunologic: Negative.   Neurological: Positive for tremors, weakness and numbness.  Hematological: Negative.   Psychiatric/Behavioral: Negative.   All other systems reviewed and are negative.     Objective:   Physical Exam Gen: NAD. Vital signs reviewed HENT: Normocephalic, Atraumatic Eyes: EOMI. No discharge.  Cardio: RRR. No JVD. Pulm: B/l clear to auscultation.  Effort normal. Abd: Soft, BS+ MSK:  Gait antalgic.   TTP diffusely neck, R>L.    No edema.   Limited ROM in b/l UE and neck, improving Neuro: CN II-XII grossly intact.    Strength  4-/5 in all UE myotomes (pain inhibition) Skin: Warm and Dry. Intact    Assessment & Plan:  57 y/o female with pmh PTSD, lumbar stenosis, depression/anxiety present with generalized pain > left neck.   1. Generalized pain, >Neck  MRI C-spine from 09/2017 reviewed, showing mild facet arthropathy  No benefit with Heat/Cold  Cont Lidoderm  Unable to tolerate Cymbalta, Lyrica, Gabapentin at higher doses, will d/c  Will consider Gralise in future  No benefit with Robaxin  Unable to afford TENS  Will order Baclofen 10 TID  Cont Mobic 15mg  with food, d/ced Ibu, reminded consistency   Will refer to Psychology  Currently in PT for wrist, will consider PT for next afterward   2. Sleep disturbance  See #1  Will order Elavil 10mg  qhs  4. Obesity  Will consider referral to dietitian  5. Myalgia   Will consider trigger point injections when able to tolerate  6. B/l CTS  Per NCS/EMG, reviewed  S/p left CTS  Plan for right CTS  Cont brace  7. Low back pain  Plan for injections per Ortho in future  8. Vit D deficiency  Cont supplement  9. Tobacco abuse  Encouraged abstinence

## 2018-01-12 ENCOUNTER — Ambulatory Visit: Payer: Medicaid Other | Admitting: Occupational Therapy

## 2018-01-12 MED FILL — METOPROLOL SUCCINATE ER 50: 50 | 30 days supply | Qty: 30 | Fill #9

## 2018-01-16 ENCOUNTER — Other Ambulatory Visit: Payer: Self-pay | Admitting: Physical Medicine & Rehabilitation

## 2018-01-16 ENCOUNTER — Ambulatory Visit: Payer: Self-pay | Attending: Family Medicine

## 2018-01-16 MED FILL — rOPINIRole HCL ER 2 MG TB24: 2 | 30 days supply | Qty: 30 | Fill #2

## 2018-01-16 MED FILL — BACLOFEN 10 MG TABLET: 10 | 10 days supply | Qty: 30 | Fill #0

## 2018-01-17 ENCOUNTER — Ambulatory Visit: Payer: Medicaid Other | Attending: Family Medicine | Admitting: Occupational Therapy

## 2018-01-17 DIAGNOSIS — M79642 Pain in left hand: Secondary | ICD-10-CM

## 2018-01-17 DIAGNOSIS — R278 Other lack of coordination: Secondary | ICD-10-CM | POA: Diagnosis present

## 2018-01-17 DIAGNOSIS — M6281 Muscle weakness (generalized): Secondary | ICD-10-CM

## 2018-01-17 DIAGNOSIS — M25632 Stiffness of left wrist, not elsewhere classified: Secondary | ICD-10-CM

## 2018-01-17 DIAGNOSIS — R208 Other disturbances of skin sensation: Secondary | ICD-10-CM | POA: Diagnosis present

## 2018-01-17 DIAGNOSIS — M25642 Stiffness of left hand, not elsewhere classified: Secondary | ICD-10-CM | POA: Diagnosis present

## 2018-01-17 NOTE — Patient Instructions (Signed)
Flexor Tendon Gliding (Active Hook Fist)   With fingers and knuckles straight, bend middle and tip joints. Do not bend large knuckles. Repeat _10-15___ times. Do _4-6___ sessions per day.  MP Flexion (Active)   With back of hand on table, bend large knuckles as far as they will go, keeping small joints straight. Repeat _10-15___ times. Do __4-6__ sessions per day. Activity: Reach into a narrow container.*      Finger Flexion / Extension   With palm up, bend fingers of left hand toward palm, making a  fist. Straighten fingers, opening fist. Repeat sequence _10-15___ times per session. Do _4-6__ sessions per day. Hand Variation: Palm down   AROM: Wrist Extension   .  With _left___ palm down, bend wrist up. Repeat __15__ times per set.  Do __4-6__ sessions per day.    AROM: Wrist Flexion   With___left__ palm up, bend wrist up. Repeat __15__ times per set.  Do _4-6___ sessions per day.    Copyright  VHI. All rights reserved.    Opposition (Active)   Touch tip of thumb to nail tip of each finger in turn, making an "O" shape. Repeat __10__ times. Do _4-6___ sessions per day.   MP Flexion (Active)   Bend thumb to touch base of little finger, keeping tip joint straight. Repeat __10-15__ times. Do _4-6___ sessions per day.         Brace thumb below tip joint. Bend joint as far as possible. Repeat __10__ times. Do _4-6___ sessions per day.   Composite Extension (Active)   Bring thumb up and out in hitchhiker position.  Repeat __10-15__ times. Do _4-6___ sessions per day.   1. Grip Strengthening (Resistive Putty)   Squeeze putty using thumb and all fingers. Repeat _20___ times. Do __2__ sessions per day.   2. Roll putty into tube on table and pinch between each finger and thumb x 10 reps each. (can do ring and small finger together)     .

## 2018-01-17 NOTE — Therapy (Signed)
[facility] [address] [place], [place], [address] Phone: [phone]   Fax:  [phone]  Occupational Therapy Treatment  Patient Details  Name: Sylvia Hall MRN: [id number] Date of Birth: [birth year] Referring Provider (OT): Dr. August Saucer   Encounter Date: 01/17/2018  OT End of Session - 01/17/18 1411    Visit Number  2    Number of Visits  17    Date for OT Re-Evaluation  02/27/18    Authorization Time Period  expires 01/21/18    OT Start Time  1402    OT Stop Time  1445    OT Time Calculation (min)  43 min    Activity Tolerance  Patient limited by pain    Behavior During Therapy  [facility] for tasks assessed/performed       Past Medical History:  Diagnosis Date  . Anxiety   . Arthritis   . Asthma   . Back pain, chronic   . Bilateral carpal tunnel syndrome   . Depression   . DVT (deep vein thrombosis) in pregnancy   . Environmental allergies   . Epilepsy (HCC)   . Fatigue   . GERD (gastroesophageal reflux disease)   . Headache    sinus  . History of kidney stones   . Mitral valve prolapse   . Palpitations   . Pericarditis   . Pneumonia   . PTSD (post-traumatic stress disorder)   . Stenosis of lumbosacral spine    numbness L hand and L leg  . Thrombophlebitis    Age 57  . Wears glasses     Past Surgical History:  Procedure Laterality Date  . ABDOMINAL HYSTERECTOMY     partial  . APPENDECTOMY    . BIOPSY BREAST     Right breast x 10  . BREAST LUMPECTOMY     Right breast  . CARPAL TUNNEL RELEASE Left 11/10/2017   Procedure: LEFT CARPAL TUNNEL RELEASE;  Surgeon: Cammy Copa, MD;  Location: [facility] OR;  Service: Orthopedics;  Laterality: Left;  . CESAREAN SECTION     x 2  . NOSE SURGERY     s/p trauma  . OVARY SURGERY    . RHINOPLASTY    . TUBAL LIGATION     left tubal  . UTERINE FIBROID SURGERY      There were no vitals filed for this visit.  Subjective Assessment - 01/17/18 1409    Pertinent History  Carpal tunnel release LUE    Patient Stated Goals  to regain mobility and grip in left hand    Currently in Pain?  Yes    Pain Score  5     Pain Location  Hand    Pain Orientation  Left    Pain Descriptors / Indicators  Aching;Burning    Pain Type  Acute pain    Pain Onset  More than a month ago    Pain Frequency  Constant    Aggravating Factors   use    Pain Relieving Factors  tylenol    Multiple Pain Sites  No             Treatment: fluidotherapy x 9 mins to LUE for pain relief and desensitization, no adverse reactions.  [place] to palmar incision and surrounding area 8 mins, 3 mhz, 0.8 w/cm2, 20% , no adverse reactions Ice pack end of session x 5 mins for pain and edema, no adverse reactions  OT Treatment/ Education - 01/17/18 1556    Education Details  tendon gliding exercises review followed by updated HEP including wrist flexion/ extension and yellow putty.- see pt instructions    Person(s) Educated  Patient    Methods  Explanation;Demonstration;Verbal cues;Handout    Comprehension  Verbalized understanding;Returned demonstration       OT Short Term Goals - 12/29/17 1308      OT SHORT TERM GOAL #1   Title  I with inital HEP    Time  4    Period  Weeks    Status  New    Target Date  01/28/18      OT SHORT TERM GOAL #2   Title  Pt will report pain less than or equal to 4/10 for ADLS.    Time  4    Period  Weeks    Status  New      OT SHORT TERM GOAL #3   Title  I with scar massage    Time  4    Period  Weeks    Status  New      OT SHORT TERM GOAL #4   Title  Pt will demonstrate LUE wrist flexion/ extension WFLS for ADLs/IADLS.    Time  4    Period  Weeks    Status  New      OT SHORT TERM GOAL #5   Title  I with all basic ADLs    Time  4    Period  Weeks    Status  New        OT Long Term Goals - 12/29/17 1310      OT LONG TERM GOAL #1   Title  I with updated HEP.    Time  8    Status  New    Target  Date  02/27/18      OT LONG TERM GOAL #2   Title  Pt will demonstrate grip strength of at least 25 lbs for increased LUE functional use during ADLs.    Time  8    Period  Weeks    Status  New      OT LONG TERM GOAL #3   Title  Pt will demonstrate improved fine motor coordination for ADLS as evidenced by decreasing 9 hole peg test by 10 secs    Baseline  RUE 31.18, LUE 57.25 secs    Time  8    Period  Weeks    Status  New      OT LONG TERM GOAL #4   Title  Pt will report improved ease with brushing hair, fastening buttons, and washing dishes using LUE.    Time  8    Period  Weeks    Status  New      OT LONG TERM GOAL #5   Title  Pt will perform home management and basic cooking modified independently    Time  8    Period  Weeks            Plan - 01/17/18 1411    Clinical Impression Statement  Pt is progressing towards goals with improving A/ROM.    Occupational Profile and client history currently impacting functional performance  Pt was independent prior to surgery, she currntly lives with her significant other who recently had an AKA. PMH: s/p L CTR 11/10/17, Hx of DJD, bilateral carpal tunnel, deperession, borderline peronality d/o, PTSD,     Occupational performance deficits (Please refer to  evaluation for details):  ADL's;IADL's;Leisure;Play;Social Participation    Rehab Potential  Good    Current Impairments/barriers affecting progress:  pain, sensory impairment    OT Frequency  2x / week    OT Duration  8 weeks    OT Treatment/Interventions  Self-care/ADL training;Therapeutic exercise;Patient/family education;Splinting;Neuromuscular education;Paraffin;Moist Heat;Fluidtherapy;Energy conservation;Scar mobilization;Therapeutic activities;Cognitive remediation/compensation;Passive range of motion;Manual Therapy;DME and/or AE instruction;Contrast Bath;Ultrasound;Cryotherapy    Plan  continue to porgress HEP, check to see if pt has received updated CAFA , it was scheduled to  expire 12/14.    Clinical Decision Making  Limited treatment options, no task modification necessary    Consulted and Agree with Plan of Care  Patient       Patient will benefit from skilled therapeutic intervention in order to improve the following deficits and impairments:  Increased edema, Impaired flexibility, Pain, Impaired sensation, Decreased coordination, Decreased activity tolerance, Decreased endurance, Decreased range of motion, Decreased strength, Impaired UE functional use, Impaired perceived functional ability, Decreased safety awareness, Decreased knowledge of precautions  Visit Diagnosis: Pain in left hand  Other lack of coordination  Muscle weakness (generalized)  Stiffness of left wrist, not elsewhere classified  Stiffness of left hand, not elsewhere classified  Other disturbances of skin sensation    Problem List Patient Active Problem List   Diagnosis Date Noted  . Chronic pain syndrome 10/07/2017  . Degenerative disc disease, lumbar 08/17/2017  . Hyperlipidemia 08/17/2017  . Left sided numbness 12/01/2013  . Acute sinusitis, unspecified 12/08/2012  . Muscle spasm of right leg 12/08/2012  . Acute upper respiratory infections of unspecified site 12/08/2012  . Severe episode of recurrent major depressive disorder (HCC) 12/07/2012  . Borderline personality disorder (HCC) 10/19/2012  . Acute posttraumatic stress disorder 08/24/2012  . Palpitations 08/16/2012  . Neuropathy 08/16/2012  . Restless leg syndrome 08/16/2012  . Routine gynecological examination 07/31/2012  . Neurogenic pain 04/12/2012  . Anxiety 11/03/2011  . Vitamin D deficiency 08/08/2011  . Chronic back pain 05/29/2011  . MVP (mitral valve prolapse) 05/29/2011  . Asthma 05/29/2011  . Tobacco user 05/29/2011  . Chronic mastitis 05/29/2011  . Allergic rhinitis 05/29/2011    Greysen Devino 01/17/2018, 4:00 PM  [facility] [address] [place], [place], [address] Phone: [phone]   Fax:  [phone]  Name: Sylvia IslamBarbara J Cortez MRN: [id number] Date of Birth: 12/16/1960

## 2018-01-19 ENCOUNTER — Ambulatory Visit: Payer: Medicaid Other | Admitting: Occupational Therapy

## 2018-01-20 ENCOUNTER — Encounter: Payer: Self-pay | Admitting: Internal Medicine

## 2018-01-20 ENCOUNTER — Ambulatory Visit: Payer: Medicaid Other | Attending: Internal Medicine | Admitting: Internal Medicine

## 2018-01-20 VITALS — BP 126/78 | HR 83 | Temp 97.8°F | Resp 16 | Wt 199.6 lb

## 2018-01-20 DIAGNOSIS — Z79899 Other long term (current) drug therapy: Secondary | ICD-10-CM | POA: Diagnosis not present

## 2018-01-20 DIAGNOSIS — F603 Borderline personality disorder: Secondary | ICD-10-CM | POA: Insufficient documentation

## 2018-01-20 DIAGNOSIS — Z888 Allergy status to other drugs, medicaments and biological substances status: Secondary | ICD-10-CM | POA: Insufficient documentation

## 2018-01-20 DIAGNOSIS — F419 Anxiety disorder, unspecified: Secondary | ICD-10-CM | POA: Insufficient documentation

## 2018-01-20 DIAGNOSIS — Z886 Allergy status to analgesic agent status: Secondary | ICD-10-CM | POA: Insufficient documentation

## 2018-01-20 DIAGNOSIS — F17210 Nicotine dependence, cigarettes, uncomplicated: Secondary | ICD-10-CM | POA: Diagnosis not present

## 2018-01-20 DIAGNOSIS — J209 Acute bronchitis, unspecified: Secondary | ICD-10-CM

## 2018-01-20 DIAGNOSIS — J45901 Unspecified asthma with (acute) exacerbation: Secondary | ICD-10-CM | POA: Diagnosis not present

## 2018-01-20 DIAGNOSIS — J0101 Acute recurrent maxillary sinusitis: Secondary | ICD-10-CM | POA: Diagnosis not present

## 2018-01-20 DIAGNOSIS — E559 Vitamin D deficiency, unspecified: Secondary | ICD-10-CM | POA: Diagnosis not present

## 2018-01-20 DIAGNOSIS — G894 Chronic pain syndrome: Secondary | ICD-10-CM | POA: Insufficient documentation

## 2018-01-20 DIAGNOSIS — Z791 Long term (current) use of non-steroidal anti-inflammatories (NSAID): Secondary | ICD-10-CM | POA: Diagnosis not present

## 2018-01-20 DIAGNOSIS — Z881 Allergy status to other antibiotic agents status: Secondary | ICD-10-CM | POA: Diagnosis not present

## 2018-01-20 DIAGNOSIS — J069 Acute upper respiratory infection, unspecified: Secondary | ICD-10-CM | POA: Diagnosis present

## 2018-01-20 DIAGNOSIS — M542 Cervicalgia: Secondary | ICD-10-CM | POA: Diagnosis not present

## 2018-01-20 DIAGNOSIS — E785 Hyperlipidemia, unspecified: Secondary | ICD-10-CM | POA: Insufficient documentation

## 2018-01-20 DIAGNOSIS — Z880 Allergy status to penicillin: Secondary | ICD-10-CM | POA: Diagnosis not present

## 2018-01-20 DIAGNOSIS — M545 Low back pain: Secondary | ICD-10-CM | POA: Diagnosis not present

## 2018-01-20 DIAGNOSIS — G629 Polyneuropathy, unspecified: Secondary | ICD-10-CM | POA: Diagnosis not present

## 2018-01-20 DIAGNOSIS — Z882 Allergy status to sulfonamides status: Secondary | ICD-10-CM | POA: Insufficient documentation

## 2018-01-20 MED ORDER — FLUTICASONE PROPIONATE 50 MCG/ACT NA SUSP: 2.0000 | Freq: Every day | NASAL | 1 refills | Status: DC

## 2018-01-20 MED ORDER — AZITHROMYCIN 250 MG PO TABS: ORAL_TABLET | ORAL | 0 refills | Status: DC

## 2018-01-20 MED ORDER — ALBUTEROL SULFATE (2.5 MG/3ML) 0.083% IN NEBU: 2.5000 mg | INHALATION_SOLUTION | Freq: Four times a day (QID) | RESPIRATORY_TRACT | 1 refills | Status: DC | PRN

## 2018-01-20 MED ORDER — PREDNISONE 10 MG PO TABS: ORAL_TABLET | ORAL | 0 refills | Status: DC

## 2018-01-20 MED FILL — FLUTICASONE PROP 50 MCG SPR: 50 | 30 days supply | Qty: 16 | Fill #0

## 2018-01-20 MED FILL — predniSONE 10 MG TABS: 10 | 6 days supply | Qty: 12 | Fill #0

## 2018-01-20 MED FILL — ALBUTEROL SUL 2.5 MG/3 ML S: (2.5 MG/3ML | 13 days supply | Qty: 150 | Fill #0

## 2018-01-20 MED FILL — AZITHROMYCIN 250 MG TABLET: 250 | 5 days supply | Qty: 6 | Fill #0

## 2018-01-20 NOTE — Patient Instructions (Signed)
Take the antibiotic and prednisone as prescribed. Use the Flonase nasal spray. If you have Claritin-D at home please use that it will help decrease sinus and chest congestion.

## 2018-01-20 NOTE — Progress Notes (Signed)
Patient ID: Sylvia Hall, female    DOB: February 26, 1960  MRN: [id number]  CC: URI   Subjective: Sylvia Hall is a 57 y.o. female who presents for UC visit. Pt of Dr. Baxter Flattery Her concerns today include:  Patient with history of asthma, chronic neck and lower back pain, vitamin D deficiency  C/o of chest and nasal congestion, SOB, wet cough, facial pain over frontal and maxillary sinuses, LT sided chest pain x 1 wk.  Fever for past 2 days.  Yesterday 101.3 yesterday Taking Tylenol with last dose a few hrs ago.  Has not been using Flonase.  She has Zyrtec and Claritin-D at home but has not been using either of them Hx of asthma.  Normally uses Albuterol PRN but in the past wk, she had to use 2-3 x a day Patient Active Problem List   Diagnosis Date Noted  . Chronic pain syndrome 10/07/2017  . Degenerative disc disease, lumbar 08/17/2017  . Hyperlipidemia 08/17/2017  . Left sided numbness 12/01/2013  . Acute sinusitis, unspecified 12/08/2012  . Muscle spasm of right leg 12/08/2012  . Acute upper respiratory infections of unspecified site 12/08/2012  . Severe episode of recurrent major depressive disorder (HCC) 12/07/2012  . Borderline personality disorder (HCC) 10/19/2012  . Acute posttraumatic stress disorder 08/24/2012  . Palpitations 08/16/2012  . Neuropathy 08/16/2012  . Restless leg syndrome 08/16/2012  . Routine gynecological examination 07/31/2012  . Neurogenic pain 04/12/2012  . Anxiety 11/03/2011  . Vitamin D deficiency 08/08/2011  . Chronic back pain 05/29/2011  . MVP (mitral valve prolapse) 05/29/2011  . Asthma 05/29/2011  . Tobacco user 05/29/2011  . Chronic mastitis 05/29/2011  . Allergic rhinitis 05/29/2011     Current Outpatient Medications on File Prior to Visit  Medication Sig Dispense Refill  . acetaminophen-codeine (TYLENOL #3) 300-30 MG tablet 1 po q 12hrs prn pain (Patient not taking: Reported on 01/20/2018) 45 tablet 0  . albuterol (PROVENTIL  HFA;VENTOLIN HFA) 108 (90 Base) MCG/ACT inhaler Inhale 2 puffs into the lungs every 6 (six) hours as needed for wheezing or shortness of breath. 1 Inhaler 1  . amitriptyline (ELAVIL) 10 MG tablet Take 1 tablet (10 mg total) by mouth at bedtime. 30 tablet 1  . baclofen (LIORESAL) 10 MG tablet TAKE 1 TABLET (10 MG TOTAL) BY MOUTH 3 (THREE) TIMES DAILY. 30 tablet 0  . cetirizine (ZYRTEC) 10 MG tablet Take 1 tablet (10 mg total) by mouth daily. 30 tablet 1  . Fluticasone-Salmeterol (ADVAIR) 250-50 MCG/DOSE AEPB Inhale 1 puff into the lungs 2 (two) times daily. 60 each 3  . HYDROcodone-acetaminophen (NORCO) 5-325 MG tablet Take 1 tablet by mouth every 6 (six) hours as needed for moderate pain. (Patient not taking: Reported on 01/20/2018) 30 tablet 0  . HYDROcodone-acetaminophen (NORCO/VICODIN) 5-325 MG tablet 1 po q 8 hrs prn pain (Patient not taking: Reported on 01/20/2018) 30 tablet 0  . lidocaine (LIDODERM) 5 % Place 1 patch onto the skin daily. Remove & Discard patch within 12 hours or as directed by MD 30 patch 3  . meloxicam (MOBIC) 15 MG tablet Take 1 tablet (15 mg total) by mouth daily. 30 tablet 1  . metoprolol succinate (TOPROL-XL) 50 MG 24 hr tablet Take 1 tablet (50 mg total) by mouth daily. Take with or immediately following a meal. 60 tablet 11  . metoprolol tartrate (LOPRESSOR) 25 MG tablet Take 0.5 tablets (12.5 mg total) by mouth as needed. For palpitations 30 tablet 3  . rOPINIRole (  REQUIP XL) 2 MG 24 hr tablet Take 1 tablet (2 mg total) by mouth at bedtime. 30 tablet 2  . Vitamin D, Ergocalciferol, (DRISDOL) 50000 units CAPS capsule Take 1 capsule (50,000 Units total) by mouth every 7 (seven) days. 16 capsule 0   No current facility-administered medications on file prior to visit.     Allergies  Allergen Reactions  . Phenytoin Sodium Extended Other (See Comments)    seizures  . Ivp Dye [Iodinated Diagnostic Agents] Hives  . Levofloxacin Hives  . Sulfa Drugs Cross Reactors Hives    . Aspirin Other (See Comments)    Upset stomach, resolves with GERD meds   . Caffeine Other (See Comments)    Heart races  . Cymbalta [Duloxetine Hcl] Diarrhea and Nausea Only    Dizzy  . Penicillins Rash    Pt called in stating she  Was prescribed Pen VK in ED and developed a generalized raised rash.  She has taken amoxicillin and augmentin since this time and not had any problem  . Pregabalin Rash  . Sodium Nitrate Other (See Comments)    headaches    Social History   Socioeconomic History  . Marital status: Single    Spouse name: Not on file  . Number of children: 2  . Years of education: Not on file  . Highest education level: Not on file  Occupational History  . Not on file  Social Needs  . Financial resource strain: Not on file  . Food insecurity:    Worry: Not on file    Inability: Not on file  . Transportation needs:    Medical: Not on file    Non-medical: Not on file  Tobacco Use  . Smoking status: Current Every Day Smoker    Packs/day: 1.00    Years: 12.00    Pack years: 12.00    Types: Cigarettes  . Smokeless tobacco: Never Used  Substance and Sexual Activity  . Alcohol use: No    Alcohol/week: 0.0 standard drinks  . Drug use: No  . Sexual activity: Not Currently    Birth control/protection: None  Lifestyle  . Physical activity:    Days per week: Not on file    Minutes per session: Not on file  . Stress: Not on file  Relationships  . Social connections:    Talks on phone: Not on file    Gets together: Not on file    Attends religious service: Not on file    Active member of club or organization: Not on file    Attends meetings of clubs or organizations: Not on file    Relationship status: Not on file  . Intimate partner violence:    Fear of current or ex partner: Not on file    Emotionally abused: Not on file    Physically abused: Not on file    Forced sexual activity: Not on file  Other Topics Concern  . Not on file  Social History  Narrative   Lives alone.      Family History  Problem Relation Age of Onset  . Diabetes Mother   . Mitral valve prolapse Mother   . Asthma Mother   . CAD Mother [id number]  . Anxiety disorder Mother   . Mitral valve prolapse Sister   . Asthma Sister   . Mitral valve prolapse Son   . Alcohol abuse Son   . Diabetes Maternal Uncle   . Diabetes Maternal Grandmother   . Mitral valve prolapse  Sister   . CAD Son 51       No details available  . Alcohol abuse Father   . Alcohol abuse Brother     Past Surgical History:  Procedure Laterality Date  . ABDOMINAL HYSTERECTOMY     partial  . APPENDECTOMY    . BIOPSY BREAST     Right breast x 10  . BREAST LUMPECTOMY     Right breast  . CARPAL TUNNEL RELEASE Left 11/10/2017   Procedure: LEFT CARPAL TUNNEL RELEASE;  Surgeon: Cammy Copa, MD;  Location: [facility] OR;  Service: Orthopedics;  Laterality: Left;  . CESAREAN SECTION     x 2  . NOSE SURGERY     s/p trauma  . OVARY SURGERY    . RHINOPLASTY    . TUBAL LIGATION     left tubal  . UTERINE FIBROID SURGERY      ROS: Review of Systems Negative except as above PHYSICAL EXAM: BP 126/78   Pulse 83   Temp 97.8 F (36.6 C) (Oral)   Resp 16   Wt 199 lb 9.6 oz (90.5 kg)   SpO2 96%   BMI 35.36 kg/m   Physical Exam  General appearance -patient with mild to moderate audible congestion in no acute respiratory distress Mental status - normal mood, behavior, speech, dress, motor activity, and thought processes Nose -mild enlargement of nasal turbinates.  Nasal mucosa dry. Mouth - mucous membranes moist, pharynx normal without lesions .throat is without exudates.   Neck -no cervical lymphadenopathy. Chest -mild to moderate expiratory wheezes with moderate rhonchi. Heart - normal rate, regular rhythm, normal S1, S2, no murmurs, rubs, clicks or gallops    ASSESSMENT AND PLAN:  1. Acute bronchitis with asthma with acute exacerbation - azithromycin (ZITHROMAX) 250 MG tablet; 2 tabs  p.o. x1, then 1 tab daily  Dispense: 6 tablet; Refill: 0 - albuterol (PROVENTIL) (2.5 MG/3ML) 0.083% nebulizer solution; Take 3 mLs (2.5 mg total) by nebulization every 6 (six) hours as needed for wheezing or shortness of breath.  Dispense: 150 mL; Refill: 1 - predniSONE (DELTASONE) 10 MG tablet; 3 tabs PO daily x 2 days then 2 tabs x 2 days then 1 tab x 2 days  Dispense: 12 tablet; Refill: 0  2. Acute recurrent maxillary sinusitis Advised to use Claritin-D or Zyrtec-D - fluticasone (FLONASE) 50 MCG/ACT nasal spray; Place 2 sprays into both nostrils daily.  Dispense: 16 g; Refill: 1   Patient was given the opportunity to ask questions.  Patient verbalized understanding of the plan and was able to repeat key elements of the plan.   No orders of the defined types were placed in this encounter.    Requested Prescriptions   Signed Prescriptions Disp Refills  . azithromycin (ZITHROMAX) 250 MG tablet 6 tablet 0    Sig: 2 tabs p.o. x1, then 1 tab daily  . albuterol (PROVENTIL) (2.5 MG/3ML) 0.083% nebulizer solution 150 mL 1    Sig: Take 3 mLs (2.5 mg total) by nebulization every 6 (six) hours as needed for wheezing or shortness of breath.  . predniSONE (DELTASONE) 10 MG tablet 12 tablet 0    Sig: 3 tabs PO daily x 2 days then 2 tabs x 2 days then 1 tab x 2 days  . fluticasone (FLONASE) 50 MCG/ACT nasal spray 16 g 1    Sig: Place 2 sprays into both nostrils daily.    Return if symptoms worsen or fail to improve.  Jonah Blue, MD, FACP

## 2018-01-23 ENCOUNTER — Ambulatory Visit: Payer: Self-pay

## 2018-01-26 ENCOUNTER — Telehealth: Payer: Self-pay | Admitting: Family Medicine

## 2018-01-26 ENCOUNTER — Ambulatory Visit: Payer: Medicaid Other | Admitting: Occupational Therapy

## 2018-01-26 NOTE — Telephone Encounter (Signed)
Pt called stating that she feels horrible and  -predazithromycin (ZITHROMAX) 250 MG tablet  -azithromycin (ZITHROMAX) 250 MG tablet  Is not working, please advice Patient uses [facility]  [phone]

## 2018-01-26 NOTE — Telephone Encounter (Signed)
Will route to PCP for review. 

## 2018-01-27 MED ORDER — DOXYCYCLINE HYCLATE 100 MG PO TABS: 100.0000 mg | ORAL_TABLET | Freq: Two times a day (BID) | ORAL | 0 refills | Status: DC

## 2018-01-27 MED FILL — DOXYCYCLINE HYCLATE 100 MG: 100 | 10 days supply | Qty: 20 | Fill #0

## 2018-01-27 NOTE — Telephone Encounter (Signed)
Doxycycline sent to the pharmacy.

## 2018-01-30 NOTE — Telephone Encounter (Signed)
Patient called back regarding her medication request. Please follow up.

## 2018-01-30 NOTE — Telephone Encounter (Signed)
Patient was called and informed of medication being sent to onsite pharmacy. 

## 2018-01-30 NOTE — Telephone Encounter (Signed)
Patient was called and a voicemail was left informing patient to return nurse phone call.

## 2018-02-02 ENCOUNTER — Ambulatory Visit: Payer: Medicaid Other | Admitting: Occupational Therapy

## 2018-02-03 ENCOUNTER — Encounter: Payer: Self-pay | Admitting: Physical Medicine & Rehabilitation

## 2018-02-03 ENCOUNTER — Encounter: Payer: Medicaid Other | Attending: Physical Medicine & Rehabilitation | Admitting: Physical Medicine & Rehabilitation

## 2018-02-03 VITALS — BP 117/79 | HR 80 | Ht 63.75 in | Wt 197.8 lb

## 2018-02-03 DIAGNOSIS — M791 Myalgia, unspecified site: Secondary | ICD-10-CM | POA: Insufficient documentation

## 2018-02-03 DIAGNOSIS — G40909 Epilepsy, unspecified, not intractable, without status epilepticus: Secondary | ICD-10-CM | POA: Insufficient documentation

## 2018-02-03 DIAGNOSIS — Z79899 Other long term (current) drug therapy: Secondary | ICD-10-CM | POA: Insufficient documentation

## 2018-02-03 DIAGNOSIS — Z8672 Personal history of thrombophlebitis: Secondary | ICD-10-CM | POA: Insufficient documentation

## 2018-02-03 DIAGNOSIS — M48061 Spinal stenosis, lumbar region without neurogenic claudication: Secondary | ICD-10-CM | POA: Insufficient documentation

## 2018-02-03 DIAGNOSIS — G479 Sleep disorder, unspecified: Secondary | ICD-10-CM | POA: Diagnosis not present

## 2018-02-03 DIAGNOSIS — F4310 Post-traumatic stress disorder, unspecified: Secondary | ICD-10-CM | POA: Diagnosis not present

## 2018-02-03 DIAGNOSIS — F17210 Nicotine dependence, cigarettes, uncomplicated: Secondary | ICD-10-CM | POA: Insufficient documentation

## 2018-02-03 DIAGNOSIS — M542 Cervicalgia: Secondary | ICD-10-CM | POA: Insufficient documentation

## 2018-02-03 DIAGNOSIS — M5136 Other intervertebral disc degeneration, lumbar region: Secondary | ICD-10-CM

## 2018-02-03 DIAGNOSIS — Z833 Family history of diabetes mellitus: Secondary | ICD-10-CM | POA: Diagnosis not present

## 2018-02-03 DIAGNOSIS — Z6832 Body mass index (BMI) 32.0-32.9, adult: Secondary | ICD-10-CM | POA: Insufficient documentation

## 2018-02-03 DIAGNOSIS — E669 Obesity, unspecified: Secondary | ICD-10-CM | POA: Diagnosis not present

## 2018-02-03 DIAGNOSIS — Z818 Family history of other mental and behavioral disorders: Secondary | ICD-10-CM | POA: Diagnosis not present

## 2018-02-03 DIAGNOSIS — Z720 Tobacco use: Secondary | ICD-10-CM

## 2018-02-03 DIAGNOSIS — Z8249 Family history of ischemic heart disease and other diseases of the circulatory system: Secondary | ICD-10-CM | POA: Diagnosis not present

## 2018-02-03 DIAGNOSIS — M545 Low back pain: Secondary | ICD-10-CM | POA: Diagnosis not present

## 2018-02-03 DIAGNOSIS — F419 Anxiety disorder, unspecified: Secondary | ICD-10-CM | POA: Diagnosis not present

## 2018-02-03 DIAGNOSIS — E559 Vitamin D deficiency, unspecified: Secondary | ICD-10-CM | POA: Insufficient documentation

## 2018-02-03 DIAGNOSIS — I341 Nonrheumatic mitral (valve) prolapse: Secondary | ICD-10-CM | POA: Diagnosis not present

## 2018-02-03 DIAGNOSIS — F329 Major depressive disorder, single episode, unspecified: Secondary | ICD-10-CM | POA: Insufficient documentation

## 2018-02-03 DIAGNOSIS — Z825 Family history of asthma and other chronic lower respiratory diseases: Secondary | ICD-10-CM | POA: Insufficient documentation

## 2018-02-03 DIAGNOSIS — G894 Chronic pain syndrome: Secondary | ICD-10-CM

## 2018-02-03 DIAGNOSIS — M792 Neuralgia and neuritis, unspecified: Secondary | ICD-10-CM

## 2018-02-03 DIAGNOSIS — J45909 Unspecified asthma, uncomplicated: Secondary | ICD-10-CM | POA: Diagnosis not present

## 2018-02-03 DIAGNOSIS — G5603 Carpal tunnel syndrome, bilateral upper limbs: Secondary | ICD-10-CM

## 2018-02-03 MED ORDER — AMITRIPTYLINE HCL 25 MG PO TABS: 25.0000 mg | ORAL_TABLET | Freq: Every day | ORAL | 1 refills | Status: DC

## 2018-02-03 MED ORDER — GABAPENTIN (ONCE-DAILY) 300 MG PO TABS: 300.0000 mg | ORAL_TABLET | Freq: Every day | ORAL | 1 refills | Status: DC

## 2018-02-03 MED ORDER — METAXALONE 800 MG PO TABS: 800.0000 mg | ORAL_TABLET | Freq: Three times a day (TID) | ORAL | 1 refills | Status: DC

## 2018-02-03 MED ORDER — DICLOFENAC SODIUM 50 MG PO TBEC: 50.0000 mg | DELAYED_RELEASE_TABLET | Freq: Three times a day (TID) | ORAL | 1 refills | Status: DC

## 2018-02-03 MED FILL — DICLOFENAC SOD DR 50 MG TAB: 50 | 30 days supply | Qty: 90 | Fill #0

## 2018-02-03 MED FILL — GABAPENTIN 300 MG CAPSULE: 300 | 30 days supply | Qty: 30 | Fill #0

## 2018-02-03 MED FILL — AMITRIPTYLINE HCL 25 MG TAB: 25 | 30 days supply | Qty: 30 | Fill #0

## 2018-02-03 NOTE — Progress Notes (Addendum)
Subjective:    Patient ID: Sylvia Hall, female    DOB: 01/31/1961, 57 y.o.   MRN: [id number]  HPI 57 y/o female with pmh PTSD, lumbar stenosis, depression/anxiety present with generalized pain.   Initially stated: >>Left neck pain. Started 2012.  Denies inciting event.  Denies alleviating factors.  Moving exacerbates the pain.  All qualities of pain.  Radiating from hand proximally.  Constant.  Gabapentin, Robaxin, Ibu, Lidocaine patch without benefit.  Associated numbness and weakness.  Pain limits any activity. Denies falls.  Pt recently saw Ortho, notes reviewed, NCS/EMG showing b/l carpal tunnel and lumbar facet arthropathy.   Last clinic visit 01/04/18.  Since that time, pt states she has been in pain all over.  She states Baclofen causes dizziness. No benefit with Mobic.  She has not followed up with Psychology, states she never received a call.  She is still in PT for her wrist.  She did not have any benefit with Elavil. She is to have CTS surgery on left hand in ~2 months. She has not followed up for L-spine injections. She continues to smoke 1 PPD.  Pain Inventory Average Pain 10 Pain Right Now 10 My pain is sharp, burning, stabbing, tingling and aching  In the last 24 hours, has pain interfered with the following? General activity 10 Relation with others 10 Enjoyment of life 10 What TIME of day is your pain at its worst? all Sleep (in general) Poor  Pain is worse with: walking, bending, sitting, inactivity and standing Pain improves with: none Relief from Meds: 0  Mobility walk without assistance how many minutes can you walk? 5 ability to climb steps?  yes do you drive?  no  Function not employed: date last employed n/a I need assistance with the following:  dressing, meal prep, household duties and shopping  Neuro/Psych weakness numbness tingling trouble walking dizziness  Prior Studies Any changes since last visit?  no  Physicians involved in your  care Any changes since last visit?  no   Family History  Problem Relation Age of Onset  . Diabetes Mother   . Mitral valve prolapse Mother   . Asthma Mother   . CAD Mother 69  . Anxiety disorder Mother   . Mitral valve prolapse Sister   . Asthma Sister   . Mitral valve prolapse Son   . Alcohol abuse Son   . Diabetes Maternal Uncle   . Diabetes Maternal Grandmother   . Mitral valve prolapse Sister   . CAD Son 73       No details available  . Alcohol abuse Father   . Alcohol abuse Brother    Social History   Socioeconomic History  . Marital status: Single    Spouse name: Not on file  . Number of children: 2  . Years of education: Not on file  . Highest education level: Not on file  Occupational History  . Not on file  Social Needs  . Financial resource strain: Not on file  . Food insecurity:    Worry: Not on file    Inability: Not on file  . Transportation needs:    Medical: Not on file    Non-medical: Not on file  Tobacco Use  . Smoking status: Current Every Day Smoker    Packs/day: 1.00    Years: 12.00    Pack years: 12.00    Types: Cigarettes  . Smokeless tobacco: Never Used  Substance and Sexual Activity  . Alcohol use:  No    Alcohol/week: 0.0 standard drinks  . Drug use: No  . Sexual activity: Not Currently    Birth control/protection: None  Lifestyle  . Physical activity:    Days per week: Not on file    Minutes per session: Not on file  . Stress: Not on file  Relationships  . Social connections:    Talks on phone: Not on file    Gets together: Not on file    Attends religious service: Not on file    Active member of club or organization: Not on file    Attends meetings of clubs or organizations: Not on file    Relationship status: Not on file  Other Topics Concern  . Not on file  Social History Narrative   Lives alone.     Past Surgical History:  Procedure Laterality Date  . ABDOMINAL HYSTERECTOMY     partial  . APPENDECTOMY    .  BIOPSY BREAST     Right breast x 10  . BREAST LUMPECTOMY     Right breast  . CARPAL TUNNEL RELEASE Left 11/10/2017   Procedure: LEFT CARPAL TUNNEL RELEASE;  Surgeon: Meredith Pel, MD;  Location: [facility];  Service: Orthopedics;  Laterality: Left;  . CESAREAN SECTION     x 2  . NOSE SURGERY     s/p trauma  . OVARY SURGERY    . RHINOPLASTY    . TUBAL LIGATION     left tubal  . UTERINE FIBROID SURGERY     Past Medical History:  Diagnosis Date  . Anxiety   . Arthritis   . Asthma   . Back pain, chronic   . Bilateral carpal tunnel syndrome   . Depression   . DVT (deep vein thrombosis) in pregnancy   . Environmental allergies   . Epilepsy ([place])   . Fatigue   . GERD (gastroesophageal reflux disease)   . Headache    sinus  . History of kidney stones   . Mitral valve prolapse   . Palpitations   . Pericarditis   . Pneumonia   . PTSD (post-traumatic stress disorder)   . Stenosis of lumbosacral spine    numbness L hand and L leg  . Thrombophlebitis    Age 64  . Wears glasses    BP 117/79   Pulse 80   Ht 5' 3.75" (1.619 m)   Wt 197 lb 12.8 oz (89.7 kg)   SpO2 96%   BMI 34.22 kg/m   Opioid Risk Score:   Fall Risk Score:  `1  Depression screen PHQ 2/9  Depression screen [facility] 2/9 01/20/2018 01/04/2018 08/17/2017 06/15/2017 05/18/2017 04/07/2017 02/16/2017  Decreased Interest 0 0 0 0 0 0 0  Down, Depressed, Hopeless 0 0 0 0 0 0 0  PHQ - 2 Score 0 0 0 0 0 0 0  Altered sleeping - - 3 0 0 0 0  Tired, decreased energy - - 1 0 0 0 0  Change in appetite - - 0 0 0 0 0  Feeling bad or failure about yourself  - - 0 0 0 0 0  Trouble concentrating - - 0 0 0 0 0  Moving slowly or fidgety/restless - - 0 0 0 0 0  Suicidal thoughts - - 0 0 0 0 0  PHQ-9 Score - - 4 0 0 0 0     Review of Systems  Constitutional: Positive for chills and fever.  HENT: Negative.   Eyes:  Negative.   Respiratory: Positive for cough, shortness of breath and wheezing.   Cardiovascular: Negative.    Gastrointestinal: Negative.   Endocrine: Negative.   Genitourinary: Negative.   Musculoskeletal: Positive for arthralgias, back pain, gait problem, myalgias, neck pain and neck stiffness.  Skin: Negative.   Allergic/Immunologic: Negative.   Neurological: Positive for tremors, weakness and numbness.  Hematological: Negative.   Psychiatric/Behavioral: Negative.   All other systems reviewed and are negative.     Objective:   Physical Exam Gen: NAD. Vital signs reviewed HENT: Normocephalic, Atraumatic Eyes: EOMI. No discharge.  Cardio: RRR. No JVD. Pulm: B/l clear to auscultation.  Effort normal. Abd: Nondistended. BS+ MSK:  Gait antalgic.   +TTP diffusely   No edema.  Neuro:   Strength  4-/5 in all nmyotomes (pain inhibition) Skin: Warm and Dry. Intact Psych: Flat    Assessment & Plan:  57 y/o female with pmh PTSD, lumbar stenosis, depression/anxiety present with generalized pain.   1. Generalized pain  MRI C-spine from 09/2017 reviewed, showing mild facet arthropathy  No benefit with Heat/Cold, Robaxin, Mobic  Cont Lidoderm  Unable to tolerate Cymbalta, Lyrica, Gabapentin at higher doses, Baclofen  Will order Gralise  Will order Skelaxin  Will consider Sevella  Will order Diclofenac 50 TID  Will order ESR/CRP/ANA  Unable to afford TENS  Refered to Psychology, recommended follow up  Currently in PT for wrist, will consider PT afterward   2. Sleep disturbance  See #1  Will increase Elavil to 48m qhs  4. Obesity  Will consider referral to dietitian in future  5. Myalgia   Will consider trigger point injections when able to tolerate  6. B/l CTS  Per NCS/EMG, reviewed  S/p left CTS  Plan for right CTS in ?~2 months  Cont brace  7. Low back pain  Plan for injections per Ortho in future  8. Vit D deficiency  Cont supplement  9. Tobacco abuse  Encouraged abstinence again

## 2018-02-06 ENCOUNTER — Other Ambulatory Visit: Payer: Self-pay

## 2018-02-06 MED ORDER — CYCLOBENZAPRINE HCL 5 MG PO TABS: 5.0000 mg | ORAL_TABLET | Freq: Two times a day (BID) | ORAL | 0 refills | Status: DC | PRN

## 2018-02-06 MED FILL — CYCLOBENZAPRINE 5 MG TABLET: 5 | 15 days supply | Qty: 30 | Fill #0

## 2018-02-06 NOTE — Telephone Encounter (Signed)
[name] from [facility] called stating that Sylvia BlakeMetaxalone is too expensive for them to order. A new prescription of different muscle relaxer would have to be sent. Ex. Cyclobenzaprine, Methacarbamol,Baclofen.

## 2018-02-06 NOTE — Addendum Note (Signed)
Addended by: Silas SacramentoLEE, Tarance Balan T on: 02/06/2018 03:16 PM   Modules accepted: Orders

## 2018-02-06 NOTE — Telephone Encounter (Signed)
We can try Flexaril 5mg  BID PRN if patient can tolerate.  Thanks.

## 2018-02-07 ENCOUNTER — Ambulatory Visit: Payer: Medicaid Other | Admitting: Occupational Therapy

## 2018-02-07 DIAGNOSIS — M79642 Pain in left hand: Secondary | ICD-10-CM | POA: Diagnosis not present

## 2018-02-07 DIAGNOSIS — M6281 Muscle weakness (generalized): Secondary | ICD-10-CM

## 2018-02-07 DIAGNOSIS — M25632 Stiffness of left wrist, not elsewhere classified: Secondary | ICD-10-CM

## 2018-02-07 DIAGNOSIS — R208 Other disturbances of skin sensation: Secondary | ICD-10-CM

## 2018-02-07 DIAGNOSIS — M25642 Stiffness of left hand, not elsewhere classified: Secondary | ICD-10-CM

## 2018-02-07 DIAGNOSIS — R278 Other lack of coordination: Secondary | ICD-10-CM

## 2018-02-07 NOTE — Patient Instructions (Signed)
AROM: Wrist Extension   .  With __left__ palm down, bend wrist up.Hold 1 lbs water bottle. Repeat __10_ times per set.  Do __1__ sessions per day.    AROM: Wrist Flexion   With__left___ palm up, bend wrist up. Repeat __10_ times per set.  Do _1__ sessions per day.

## 2018-02-07 NOTE — Therapy (Signed)
[facility] [address] [place], [place], [address] Phone: [phone]   Fax:  [phone]  Occupational Therapy Treatment  Patient Details  Name: Sylvia IslamBarbara J Hall MRN: [id number] Date of Birth: 02/21/1960 Referring Provider (OT): Dr. August Saucerean   Encounter Date: 02/07/2018  OT End of Session - 02/07/18 1329    Visit Number  3    Number of Visits  17    Date for OT Re-Evaluation  02/27/18    Authorization Type  CAFA    Authorization Time Period  12/16-6/16/2020    OT Start Time  1317    OT Stop Time  1400    OT Time Calculation (min)  43 min    Activity Tolerance  Patient limited by pain    Behavior During Therapy  [facility] for tasks assessed/performed       Past Medical History:  Diagnosis Date  . Anxiety   . Arthritis   . Asthma   . Back pain, chronic   . Bilateral carpal tunnel syndrome   . Depression   . DVT (deep vein thrombosis) in pregnancy   . Environmental allergies   . Epilepsy ([facility])   . Fatigue   . GERD (gastroesophageal reflux disease)   . Headache    sinus  . History of kidney stones   . Mitral valve prolapse   . Palpitations   . Pericarditis   . Pneumonia   . PTSD (post-traumatic stress disorder)   . Stenosis of lumbosacral spine    numbness L hand and L leg  . Thrombophlebitis    Age 57  . Wears glasses     Past Surgical History:  Procedure Laterality Date  . ABDOMINAL HYSTERECTOMY     partial  . APPENDECTOMY    . BIOPSY BREAST     Right breast x 10  . BREAST LUMPECTOMY     Right breast  . CARPAL TUNNEL RELEASE Left 11/10/2017   Procedure: LEFT CARPAL TUNNEL RELEASE;  Surgeon: Cammy Copaean, Gregory Scott, MD;  [facility];  Service: Orthopedics;  Laterality: Left;  . CESAREAN SECTION     x 2  . NOSE SURGERY     s/p trauma  . OVARY SURGERY    . RHINOPLASTY    . TUBAL LIGATION     left tubal  . UTERINE FIBROID SURGERY      There were no vitals filed for this visit.  Subjective  Assessment - 02/07/18 1328    Subjective   Pt reports pain today in LUE    Pertinent History  Carpal tunnel release LUE    Patient Stated Goals  to regain mobility and grip in left hand    Currently in Pain?  Yes    Pain Score  5     Pain Location  Hand    Pain Orientation  Left    Pain Descriptors / Indicators  Aching    Pain Type  Acute pain    Pain Onset  More than a month ago    Pain Frequency  Constant    Aggravating Factors   use    Pain Relieving Factors  tylenol            Treatment: fluidotherapy x 9 mins to LUE for pain relief and desensitization, no adverse reactions.  US to palmar incision and surrounding area 8 mins, 3 mhz, 0.8 w/cm2, 20% , no adverse reactions Ice pack end of session x 5 mins for pain and edema, no adverse  reactions                 OT Education - 02/07/18 1401    Education Details  tendon gliding exercises review followed by updated HEP including wrist flexion/ extension with 1 lbs weight and red putty. See pt instructions   Person(s) Educated  Patient    Methods  Explanation;Demonstration;Verbal cues;Handout    Comprehension  Verbalized understanding;Returned demonstration       OT Short Term Goals - 12/29/17 1308      OT SHORT TERM GOAL #1   Title  I with inital HEP    Time  4    Period  Weeks    Status  New    Target Date  01/28/18      OT SHORT TERM GOAL #2   Title  Pt will report pain less than or equal to 4/10 for ADLS.    Time  4    Period  Weeks    Status  New      OT SHORT TERM GOAL #3   Title  I with scar massage    Time  4    Period  Weeks    Status  New      OT SHORT TERM GOAL #4   Title  Pt will demonstrate LUE wrist flexion/ extension WFLS for ADLs/IADLS.    Time  4    Period  Weeks    Status  New      OT SHORT TERM GOAL #5   Title  I with all basic ADLs    Time  4    Period  Weeks    Status  New        OT Long Term Goals - 12/29/17 1310      OT LONG TERM GOAL #1   Title  I with  updated HEP.    Time  8    Status  New    Target Date  02/27/18      OT LONG TERM GOAL #2   Title  Pt will demonstrate grip strength of at least 25 lbs for increased LUE functional use during ADLs.    Time  8    Period  Weeks    Status  New      OT LONG TERM GOAL #3   Title  Pt will demonstrate improved fine motor coordination for ADLS as evidenced by decreasing 9 hole peg test by 10 secs    Baseline  RUE 31.18, LUE 57.25 secs    Time  8    Period  Weeks    Status  New      OT LONG TERM GOAL #4   Title  Pt will report improved ease with brushing hair, fastening buttons, and washing dishes using LUE.    Time  8    Period  Weeks    Status  New      OT LONG TERM GOAL #5   Title  Pt will perform home management and basic cooking modified independently    Time  8    Period  Weeks            Plan - 02/07/18 1355    Clinical Impression Statement  Pt is progressing towards goals with improving A/ROM and strength.    Occupational Profile and client history currently impacting functional performance  Pt was independent prior to surgery, she currntly lives with her significant other who recently had an AKA. PMH: s/p L CTR 11/10/17,  Hx of DJD, bilateral carpal tunnel, deperession, borderline peronality d/o, PTSD,     Rehab Potential  Good    Current Impairments/barriers affecting progress:  pain, sensory impairment    OT Frequency  2x / week    OT Duration  8 weeks    OT Treatment/Interventions  Self-care/ADL training;Therapeutic exercise;Patient/family education;Splinting;Neuromuscular education;Paraffin;Moist Heat;Fluidtherapy;Energy conservation;Scar mobilization;Therapeutic activities;Cognitive remediation/compensation;Passive range of motion;Manual Therapy;DME and/or AE instruction;Contrast Bath;Ultrasound;Cryotherapy    Plan  continue to work towards short term goals, and long term goals.    Clinical Decision Making  Limited treatment options, no task modification necessary     OT Home Exercise Plan  tendon gliding, scar massage    Consulted and Agree with Plan of Care  Patient       Patient will benefit from skilled therapeutic intervention in order to improve the following deficits and impairments:  Increased edema, Impaired flexibility, Pain, Impaired sensation, Decreased coordination, Decreased activity tolerance, Decreased endurance, Decreased range of motion, Decreased strength, Impaired UE functional use, Impaired perceived functional ability, Decreased safety awareness, Decreased knowledge of precautions  Visit Diagnosis: Pain in left hand  Other lack of coordination  Muscle weakness (generalized)  Stiffness of left wrist, not elsewhere classified  Stiffness of left hand, not elsewhere classified  Other disturbances of skin sensation    Problem List Patient Active Problem List   Diagnosis Date Noted  . Chronic pain syndrome 10/07/2017  . Degenerative disc disease, lumbar 08/17/2017  . Hyperlipidemia 08/17/2017  . Left sided numbness 12/01/2013  . Acute sinusitis, unspecified 12/08/2012  . Muscle spasm of right leg 12/08/2012  . Acute upper respiratory infections of unspecified site 12/08/2012  . Severe episode of recurrent major depressive disorder (HCC) 12/07/2012  . Borderline personality disorder (HCC) 10/19/2012  . Acute posttraumatic stress disorder 08/24/2012  . Palpitations 08/16/2012  . Neuropathy 08/16/2012  . Restless leg syndrome 08/16/2012  . Routine gynecological examination 07/31/2012  . Neurogenic pain 04/12/2012  . Anxiety 11/03/2011  . Vitamin D deficiency 08/08/2011  . Chronic back pain 05/29/2011  . MVP (mitral valve prolapse) 05/29/2011  . Asthma 05/29/2011  . Tobacco user 05/29/2011  . Chronic mastitis 05/29/2011  . Allergic rhinitis 05/29/2011    Camrynn Mcclintic 02/07/2018, 2:03 PM  [facility] [address] [place], [place], [address] Phone:  [phone]   Fax:  [phone]  Name: Sylvia IslamBarbara J Hall MRN: [id number] Date of Birth: 03/20/1960

## 2018-02-09 ENCOUNTER — Other Ambulatory Visit: Payer: Self-pay | Admitting: Family Medicine

## 2018-02-09 ENCOUNTER — Ambulatory Visit: Payer: Medicaid Other | Attending: Family Medicine | Admitting: Occupational Therapy

## 2018-02-09 DIAGNOSIS — M25642 Stiffness of left hand, not elsewhere classified: Secondary | ICD-10-CM | POA: Diagnosis present

## 2018-02-09 DIAGNOSIS — M25632 Stiffness of left wrist, not elsewhere classified: Secondary | ICD-10-CM | POA: Diagnosis present

## 2018-02-09 DIAGNOSIS — R278 Other lack of coordination: Secondary | ICD-10-CM | POA: Insufficient documentation

## 2018-02-09 DIAGNOSIS — M79642 Pain in left hand: Secondary | ICD-10-CM | POA: Insufficient documentation

## 2018-02-09 DIAGNOSIS — M6281 Muscle weakness (generalized): Secondary | ICD-10-CM | POA: Diagnosis present

## 2018-02-09 DIAGNOSIS — G2581 Restless legs syndrome: Secondary | ICD-10-CM

## 2018-02-09 DIAGNOSIS — R208 Other disturbances of skin sensation: Secondary | ICD-10-CM | POA: Diagnosis present

## 2018-02-09 LAB — C-REACTIVE PROTEIN: CRP: 4 mg/L (ref 0–10)

## 2018-02-09 LAB — ANA: Anti Nuclear Antibody(ANA): NEGATIVE

## 2018-02-09 LAB — SEDIMENTATION RATE: Sed Rate: 13 mm/hr (ref 0–40)

## 2018-02-09 MED FILL — METOPROLOL SUCCINATE ER 50: 50 | 30 days supply | Qty: 30 | Fill #10

## 2018-02-09 NOTE — Therapy (Signed)
[facility] [address] [place], [place], [address] Phone: [phone]   Fax:  [phone]  Occupational Therapy Treatment  Patient Details  Name: Sylvia Hall MRN: [id number] Date of Birth: 26-Oct-1960 Referring Provider (OT): Dr. August Saucer   Encounter Date: 02/09/2018  OT End of Session - 02/09/18 1319    Visit Number  4    Number of Visits  17    Date for OT Re-Evaluation  02/27/18    Authorization Type  CAFA    Authorization Time Period  12/16-6/16/2020    OT Start Time  1317    OT Stop Time  1400    OT Time Calculation (min)  43 min    Activity Tolerance  Patient limited by pain    Behavior During Therapy  [facility] for tasks assessed/performed       Past Medical History:  Diagnosis Date  . Anxiety   . Arthritis   . Asthma   . Back pain, chronic   . Bilateral carpal tunnel syndrome   . Depression   . DVT (deep vein thrombosis) in pregnancy   . Environmental allergies   . Epilepsy ([facility])   . Fatigue   . GERD (gastroesophageal reflux disease)   . Headache    sinus  . History of kidney stones   . Mitral valve prolapse   . Palpitations   . Pericarditis   . Pneumonia   . PTSD (post-traumatic stress disorder)   . Stenosis of lumbosacral spine    numbness L hand and L leg  . Thrombophlebitis    Age 58  . Wears glasses     Past Surgical History:  Procedure Laterality Date  . ABDOMINAL HYSTERECTOMY     partial  . APPENDECTOMY    . BIOPSY BREAST     Right breast x 10  . BREAST LUMPECTOMY     Right breast  . CARPAL TUNNEL RELEASE Left 11/10/2017   Procedure: LEFT CARPAL TUNNEL RELEASE;  Surgeon: Cammy Copa, MD;  Location: [facility] OR;  Service: Orthopedics;  Laterality: Left;  . CESAREAN SECTION     x 2  . NOSE SURGERY     s/p trauma  . OVARY SURGERY    . RHINOPLASTY    . TUBAL LIGATION     left tubal  . UTERINE FIBROID SURGERY      There were no vitals filed for this visit.  Subjective  Assessment - 02/09/18 1321    Currently in Pain?  Yes    Pain Score  7     Pain Location  Hand    Pain Orientation  Left    Pain Descriptors / Indicators  Aching    Pain Type  Chronic pain    Pain Onset  More than a month ago    Pain Frequency  Constant    Aggravating Factors   use     Pain Relieving Factors  tylenol             Treatment: Paraffin to LUE x 10 mins for pain relief , no adverse reactions. [place] , 0.8w/cm2 50% x 8 mins for scar mobilization. Tendon gliding exercises, followed by A/ROM wrist flexion/ extension x 10-15 reps each Reviewed red putty exercises for grip and pinch, min v.c Ice pack at end of session x 8 mins                 OT Short Term Goals - 02/09/18 1320  OT SHORT TERM GOAL #1   Title  I with inital HEP    Time  4    Period  Weeks    Status  Achieved      OT SHORT TERM GOAL #2   Title  Pt will report pain less than or equal to 4/10 for ADLS.    Time  4    Period  Weeks    Status  On-going      OT SHORT TERM GOAL #3   Title  I with scar massage    Time  4    Period  Weeks    Status  Achieved      OT SHORT TERM GOAL #4   Title  Pt will demonstrate LUE wrist flexion/ extension WFLS for ADLs/IADLS.    Time  4    Period  Weeks    Status  On-going      OT SHORT TERM GOAL #5   Title  I with all basic ADLs    Time  4    Period  Weeks    Status  On-going        OT Long Term Goals - 12/29/17 1310      OT LONG TERM GOAL #1   Title  I with updated HEP.    Time  8    Status  New    Target Date  02/27/18      OT LONG TERM GOAL #2   Title  Pt will demonstrate grip strength of at least 25 lbs for increased LUE functional use during ADLs.    Time  8    Period  Weeks    Status  New      OT LONG TERM GOAL #3   Title  Pt will demonstrate improved fine motor coordination for ADLS as evidenced by decreasing 9 hole peg test by 10 secs    Baseline  RUE 31.18, LUE 57.25 secs    Time  8    Period  Weeks    Status   New      OT LONG TERM GOAL #4   Title  Pt will report improved ease with brushing hair, fastening buttons, and washing dishes using LUE.    Time  8    Period  Weeks    Status  New      OT LONG TERM GOAL #5   Title  Pt will perform home management and basic cooking modified independently    Time  8    Period  Weeks            Plan - 02/09/18 1323    Clinical Impression Statement  Pt is progressing towards goals with improving A/ROM and strength. Pain remains a limiting factor.    Occupational Profile and client history currently impacting functional performance  Pt was independent prior to surgery, she currntly lives with her significant other who recently had an AKA. PMH: s/p L CTR 11/10/17, Hx of DJD, bilateral carpal tunnel, deperession, borderline peronality d/o, PTSD,     Occupational performance deficits (Please refer to evaluation for details):  ADL's;IADL's;Leisure;Play;Social Participation    Rehab Potential  Good    Current Impairments/barriers affecting progress:  pain, sensory impairment    OT Frequency  2x / week    OT Duration  8 weeks    OT Treatment/Interventions  Self-care/ADL training;Therapeutic exercise;Patient/family education;Splinting;Neuromuscular education;Paraffin;Moist Heat;Fluidtherapy;Energy conservation;Scar mobilization;Therapeutic activities;Cognitive remediation/compensation;Passive range of motion;Manual Therapy;DME and/or AE instruction;Contrast Bath;Ultrasound;Cryotherapy    Plan  continue to work towards short term goals, and long term goals.    Clinical Decision Making  Limited treatment options, no task modification necessary    OT Home Exercise Plan  tendon gliding, scar massage    Consulted and Agree with Plan of Care  Patient       Patient will benefit from skilled therapeutic intervention in order to improve the following deficits and impairments:  Increased edema, Impaired flexibility, Pain, Impaired sensation, Decreased coordination,  Decreased activity tolerance, Decreased endurance, Decreased range of motion, Decreased strength, Impaired UE functional use, Impaired perceived functional ability, Decreased safety awareness, Decreased knowledge of precautions  Visit Diagnosis: Pain in left hand  Other lack of coordination  Muscle weakness (generalized)  Stiffness of left wrist, not elsewhere classified  Stiffness of left hand, not elsewhere classified    Problem List Patient Active Problem List   Diagnosis Date Noted  . Chronic pain syndrome 10/07/2017  . Degenerative disc disease, lumbar 08/17/2017  . Hyperlipidemia 08/17/2017  . Left sided numbness 12/01/2013  . Acute sinusitis, unspecified 12/08/2012  . Muscle spasm of right leg 12/08/2012  . Acute upper respiratory infections of unspecified site 12/08/2012  . Severe episode of recurrent major depressive disorder (HCC) 12/07/2012  . Borderline personality disorder (HCC) 10/19/2012  . Acute posttraumatic stress disorder 08/24/2012  . Palpitations 08/16/2012  . Neuropathy 08/16/2012  . Restless leg syndrome 08/16/2012  . Routine gynecological examination 07/31/2012  . Neurogenic pain 04/12/2012  . Anxiety 11/03/2011  . Vitamin D deficiency 08/08/2011  . Chronic back pain 05/29/2011  . MVP (mitral valve prolapse) 05/29/2011  . Asthma 05/29/2011  . Tobacco user 05/29/2011  . Chronic mastitis 05/29/2011  . Allergic rhinitis 05/29/2011    Petrina Melby 02/09/2018, 1:25 PM  [facility] [address] [place], [place], [address] Phone: [phone]   Fax:  [phone]  Name: Sylvia Hall MRN: [id number] Date of Birth: 26-Sep-1960

## 2018-02-13 MED FILL — rOPINIRole HCL ER 2 MG TB24: 2 | 30 days supply | Qty: 30 | Fill #0

## 2018-02-14 ENCOUNTER — Ambulatory Visit: Payer: Medicaid Other | Admitting: Occupational Therapy

## 2018-02-16 ENCOUNTER — Ambulatory Visit: Payer: Medicaid Other | Admitting: Occupational Therapy

## 2018-02-16 DIAGNOSIS — M79642 Pain in left hand: Secondary | ICD-10-CM | POA: Diagnosis not present

## 2018-02-16 DIAGNOSIS — M25642 Stiffness of left hand, not elsewhere classified: Secondary | ICD-10-CM

## 2018-02-16 DIAGNOSIS — M25632 Stiffness of left wrist, not elsewhere classified: Secondary | ICD-10-CM

## 2018-02-16 DIAGNOSIS — R278 Other lack of coordination: Secondary | ICD-10-CM

## 2018-02-16 DIAGNOSIS — M6281 Muscle weakness (generalized): Secondary | ICD-10-CM

## 2018-02-16 NOTE — Therapy (Signed)
[facility] [address] [place], [place], [address] Phone: [phone]   Fax:  [phone]  Occupational Therapy Treatment  Patient Details  Name: Sylvia Hall MRN: [id number] Date of Birth: Mar 10, 1960 Referring Provider (OT): Dr. August Saucer   Encounter Date: 02/16/2018  OT End of Session - 02/16/18 1352    Visit Number  5    Number of Visits  17    Date for OT Re-Evaluation  03/09/17   extended due to missed visits   Authorization Type  CAFA    Authorization Time Period  12/16-6/16/2020    OT Start Time  1318    OT Stop Time  1400    OT Time Calculation (min)  42 min    Activity Tolerance  Patient limited by pain    Behavior During Therapy  [facility] for tasks assessed/performed       Past Medical History:  Diagnosis Date  . Anxiety   . Arthritis   . Asthma   . Back pain, chronic   . Bilateral carpal tunnel syndrome   . Depression   . DVT (deep vein thrombosis) in pregnancy   . Environmental allergies   . Epilepsy (HCC)   . Fatigue   . GERD (gastroesophageal reflux disease)   . Headache    sinus  . History of kidney stones   . Mitral valve prolapse   . Palpitations   . Pericarditis   . Pneumonia   . PTSD (post-traumatic stress disorder)   . Stenosis of lumbosacral spine    numbness L hand and L leg  . Thrombophlebitis    Age 58  . Wears glasses     Past Surgical History:  Procedure Laterality Date  . ABDOMINAL HYSTERECTOMY     partial  . APPENDECTOMY    . BIOPSY BREAST     Right breast x 10  . BREAST LUMPECTOMY     Right breast  . CARPAL TUNNEL RELEASE Left 11/10/2017   Procedure: LEFT CARPAL TUNNEL RELEASE;  Surgeon: Cammy Copa, MD;  Location: [facility] OR;  Service: Orthopedics;  Laterality: Left;  . CESAREAN SECTION     x 2  . NOSE SURGERY     s/p trauma  . OVARY SURGERY    . RHINOPLASTY    . TUBAL LIGATION     left tubal  . UTERINE FIBROID SURGERY      There were no vitals filed for  this visit.  Subjective Assessment - 02/16/18 1354    Subjective   Pt reports getting her meds refilled and she feels much bettter.    Pertinent History  Carpal tunnel release LUE    Patient Stated Goals  to regain mobility and grip in left hand    Currently in Pain?  Yes    Pain Score  5     Pain Location  Hand    Pain Orientation  Left    Pain Descriptors / Indicators  Aching    Pain Type  Chronic pain    Pain Onset  More than a month ago    Pain Frequency  Constant    Aggravating Factors   use     Pain Relieving Factors  heat             Treatment: Fluidotherapy x 9 mins to LUE for pain relief and desensitization, no adverse reactions. [place] , 0.8w/cm2 50% x 8 mins for scar mobilizationfollowed by A/ROM wrist flexion/ extension x 10-15 reps each, then  with 1 lbs weight Graded clothespins with LUE for sustained 3 pt and lateral pinch, followed by gripper set at level 1 to pick up 1 inch blocks, several rest breaks required, for sustained grip, mod difficulty                OT Short Term Goals - 02/09/18 1320      OT SHORT TERM GOAL #1   Title  I with inital HEP    Time  4    Period  Weeks    Status  Achieved      OT SHORT TERM GOAL #2   Title  Pt will report pain less than or equal to 4/10 for ADLS.    Time  4    Period  Weeks    Status  On-going      OT SHORT TERM GOAL #3   Title  I with scar massage    Time  4    Period  Weeks    Status  Achieved      OT SHORT TERM GOAL #4   Title  Pt will demonstrate LUE wrist flexion/ extension WFLS for ADLs/IADLS.    Time  4    Period  Weeks    Status  On-going      OT SHORT TERM GOAL #5   Title  I with all basic ADLs    Time  4    Period  Weeks    Status  On-going        OT Long Term Goals - 12/29/17 1310      OT LONG TERM GOAL #1   Title  I with updated HEP.    Time  8    Status  New    Target Date  02/27/18      OT LONG TERM GOAL #2   Title  Pt will demonstrate grip strength of at least  25 lbs for increased LUE functional use during ADLs.    Time  8    Period  Weeks    Status  New      OT LONG TERM GOAL #3   Title  Pt will demonstrate improved fine motor coordination for ADLS as evidenced by decreasing 9 hole peg test by 10 secs    Baseline  RUE 31.18, LUE 57.25 secs    Time  8    Period  Weeks    Status  New      OT LONG TERM GOAL #4   Title  Pt will report improved ease with brushing hair, fastening buttons, and washing dishes using LUE.    Time  8    Period  Weeks    Status  New      OT LONG TERM GOAL #5   Title  Pt will perform home management and basic cooking modified independently    Time  8    Period  Weeks            Plan - 02/16/18 1353    Clinical Impression Statement  Pt is progressing towards goals. She demonstrates decreasing pain and increasing strength.    Occupational Profile and client history currently impacting functional performance  Pt was independent prior to surgery, she currently lives with her significant other who recently had an AKA. PMH: s/p L CTR 11/10/17, Hx of DJD, bilateral carpal tunnel, deperession, borderline peronality d/o, PTSD,     Occupational performance deficits (Please refer to evaluation for details):  ADL's;IADL's;Leisure;Play;Social Participation  Rehab Potential  Good    Current Impairments/barriers affecting progress:  pain, sensory impairment    OT Frequency  2x / week    OT Duration  8 weeks    OT Treatment/Interventions  Self-care/ADL training;Therapeutic exercise;Patient/family education;Splinting;Neuromuscular education;Paraffin;Moist Heat;Fluidtherapy;Energy conservation;Scar mobilization;Therapeutic activities;Cognitive remediation/compensation;Passive range of motion;Manual Therapy;DME and/or AE instruction;Contrast Bath;Ultrasound;Cryotherapy    Plan  continue to work towards short term goals, and long term goals.    Consulted and Agree with Plan of Care  Patient       Patient will benefit from  skilled therapeutic intervention in order to improve the following deficits and impairments:  Increased edema, Impaired flexibility, Pain, Impaired sensation, Decreased coordination, Decreased activity tolerance, Decreased endurance, Decreased range of motion, Decreased strength, Impaired UE functional use, Impaired perceived functional ability, Decreased safety awareness, Decreased knowledge of precautions  Visit Diagnosis: No diagnosis found.    Problem List Patient Active Problem List   Diagnosis Date Noted  . Chronic pain syndrome 10/07/2017  . Degenerative disc disease, lumbar 08/17/2017  . Hyperlipidemia 08/17/2017  . Left sided numbness 12/01/2013  . Acute sinusitis, unspecified 12/08/2012  . Muscle spasm of right leg 12/08/2012  . Acute upper respiratory infections of unspecified site 12/08/2012  . Severe episode of recurrent major depressive disorder (HCC) 12/07/2012  . Borderline personality disorder (HCC) 10/19/2012  . Acute posttraumatic stress disorder 08/24/2012  . Palpitations 08/16/2012  . Neuropathy 08/16/2012  . Restless leg syndrome 08/16/2012  . Routine gynecological examination 07/31/2012  . Neurogenic pain 04/12/2012  . Anxiety 11/03/2011  . Vitamin D deficiency 08/08/2011  . Chronic back pain 05/29/2011  . MVP (mitral valve prolapse) 05/29/2011  . Asthma 05/29/2011  . Tobacco user 05/29/2011  . Chronic mastitis 05/29/2011  . Allergic rhinitis 05/29/2011    Ama Mcmaster 02/16/2018, 1:56 PM  [facility] [address] [place], [place], [address] Phone: [phone]   Fax:  [phone]  Name: Asencion IslamBarbara J Lybarger MRN: [id number] Date of Birth: 05/21/1960

## 2018-02-20 ENCOUNTER — Ambulatory Visit: Payer: Medicaid Other | Attending: Family Medicine | Admitting: Family Medicine

## 2018-02-20 ENCOUNTER — Encounter: Payer: Self-pay | Admitting: Family Medicine

## 2018-02-20 ENCOUNTER — Other Ambulatory Visit: Payer: Self-pay | Admitting: Physical Medicine & Rehabilitation

## 2018-02-20 VITALS — BP 107/68 | HR 71 | Temp 97.9°F | Ht 63.75 in | Wt 200.0 lb

## 2018-02-20 DIAGNOSIS — M25561 Pain in right knee: Secondary | ICD-10-CM | POA: Insufficient documentation

## 2018-02-20 DIAGNOSIS — M542 Cervicalgia: Secondary | ICD-10-CM | POA: Insufficient documentation

## 2018-02-20 DIAGNOSIS — Z79899 Other long term (current) drug therapy: Secondary | ICD-10-CM | POA: Diagnosis not present

## 2018-02-20 DIAGNOSIS — Z880 Allergy status to penicillin: Secondary | ICD-10-CM | POA: Insufficient documentation

## 2018-02-20 DIAGNOSIS — J4520 Mild intermittent asthma, uncomplicated: Secondary | ICD-10-CM | POA: Diagnosis present

## 2018-02-20 DIAGNOSIS — F4310 Post-traumatic stress disorder, unspecified: Secondary | ICD-10-CM | POA: Insufficient documentation

## 2018-02-20 DIAGNOSIS — Z886 Allergy status to analgesic agent status: Secondary | ICD-10-CM | POA: Diagnosis not present

## 2018-02-20 DIAGNOSIS — L659 Nonscarring hair loss, unspecified: Secondary | ICD-10-CM | POA: Insufficient documentation

## 2018-02-20 DIAGNOSIS — G40909 Epilepsy, unspecified, not intractable, without status epilepticus: Secondary | ICD-10-CM | POA: Insufficient documentation

## 2018-02-20 DIAGNOSIS — K219 Gastro-esophageal reflux disease without esophagitis: Secondary | ICD-10-CM | POA: Diagnosis not present

## 2018-02-20 DIAGNOSIS — Z888 Allergy status to other drugs, medicaments and biological substances status: Secondary | ICD-10-CM | POA: Diagnosis not present

## 2018-02-20 DIAGNOSIS — Z86718 Personal history of other venous thrombosis and embolism: Secondary | ICD-10-CM | POA: Insufficient documentation

## 2018-02-20 DIAGNOSIS — M5136 Other intervertebral disc degeneration, lumbar region: Secondary | ICD-10-CM | POA: Diagnosis not present

## 2018-02-20 DIAGNOSIS — Z87442 Personal history of urinary calculi: Secondary | ICD-10-CM | POA: Diagnosis not present

## 2018-02-20 DIAGNOSIS — G5603 Carpal tunnel syndrome, bilateral upper limbs: Secondary | ICD-10-CM | POA: Insufficient documentation

## 2018-02-20 DIAGNOSIS — I341 Nonrheumatic mitral (valve) prolapse: Secondary | ICD-10-CM | POA: Diagnosis not present

## 2018-02-20 DIAGNOSIS — Z882 Allergy status to sulfonamides status: Secondary | ICD-10-CM | POA: Diagnosis not present

## 2018-02-20 DIAGNOSIS — G2581 Restless legs syndrome: Secondary | ICD-10-CM | POA: Insufficient documentation

## 2018-02-20 DIAGNOSIS — Z7951 Long term (current) use of inhaled steroids: Secondary | ICD-10-CM | POA: Diagnosis not present

## 2018-02-20 DIAGNOSIS — G8929 Other chronic pain: Secondary | ICD-10-CM | POA: Diagnosis not present

## 2018-02-20 DIAGNOSIS — Z881 Allergy status to other antibiotic agents status: Secondary | ICD-10-CM | POA: Insufficient documentation

## 2018-02-20 MED ORDER — FLUTICASONE-SALMETEROL 250-50 MCG/DOSE IN AEPB: 1.0000 | INHALATION_SPRAY | Freq: Two times a day (BID) | RESPIRATORY_TRACT | 6 refills | Status: DC

## 2018-02-20 MED ORDER — ROPINIROLE HCL ER 2 MG PO TB24: 2.0000 mg | ORAL_TABLET | Freq: Every day | ORAL | 6 refills | Status: DC

## 2018-02-20 MED FILL — !ADVAIR 250/50 DISKUS: 250-50 | 30 days supply | Qty: 60 | Fill #0

## 2018-02-20 NOTE — Telephone Encounter (Signed)
Recieved electronic medication refill request for cyclobenzaprine.  No mention in previous notes of this medication.  Unsure to refill.  Please advise.

## 2018-02-20 NOTE — Patient Instructions (Signed)
Acute Knee Pain, Adult  Acute knee pain is sudden and may be caused by damage, swelling, or irritation of the muscles and tissues that support your knee. The injury may result from:   A fall.   An injury to your knee from twisting motions.   A hit to the knee.   Infection.  Acute knee pain may go away on its own with time and rest. If it does not, your health care provider may order tests to find the cause of the pain. These may include:   Imaging tests, such as an X-ray, MRI, or ultrasound.   Joint aspiration. In this test, fluid is removed from the knee.   Arthroscopy. In this test, a lighted tube is inserted into the knee and an image is projected onto a TV screen.   Biopsy. In this test, a sample of tissue is removed from the body and studied under a microscope.  Follow these instructions at home:  Pay attention to any changes in your symptoms. Take these actions to relieve your pain.  If you have a knee sleeve or brace:     Wear the sleeve or brace as told by your health care provider. Remove it only as told by your health care provider.   Loosen the sleeve or brace if your toes tingle, become numb, or turn cold and blue.   Keep the sleeve or brace clean.   If the sleeve or brace is not waterproof:  ? Do not let it get wet.  ? Cover it with a watertight covering when you take a bath or shower.  Activity   Rest your knee.   Do not do things that cause pain or make pain worse.   Avoid high-impact activities or exercises, such as running, jumping rope, or doing jumping jacks.   Work with a physical therapist to make a safe exercise program, as recommended by your health care provider. Do exercises as told by your physical therapist.  Managing pain, stiffness, and swelling     If directed, put ice on the knee:  ? Put ice in a plastic bag.  ? Place a towel between your skin and the bag.  ? Leave the ice on for 20 minutes, 2-3 times a day.   If directed, use an elastic bandage to put pressure  (compression) on your injured knee. This may control swelling, give support, and help with discomfort.  General instructions   Take over-the-counter and prescription medicines only as told by your health care provider.   Raise (elevate) your knee above the level of your heart when you are sitting or lying down.   Sleep with a pillow under your knee.   Do not use any products that contain nicotine or tobacco, such as cigarettes, e-cigarettes, and chewing tobacco. These can delay healing. If you need help quitting, ask your health care provider.   If you are overweight, work with your health care provider and a dietitian to set a weight-loss goal that is healthy and reasonable for you. Extra weight can put pressure on your knee.   Keep all follow-up visits as told by your health care provider. This is important.  Contact a health care provider if:   Your knee pain continues, changes, or gets worse.   You have a fever along with knee pain.   Your knee feels warm to the touch.   Your knee buckles or locks up.  Get help right away if:   Your knee swells,   and the swelling becomes worse.   You cannot move your knee.   You have severe pain in your knee.  Summary   Acute knee pain can be caused by a fall, an injury, an infection, or damage, swelling, or irritation of the tissues that support your knee.   Your health care provider may perform tests to find out the cause of the pain.   Pay attention to any changes in your symptoms. Relieve your pain with rest, medicines, light activity, and use of ice.   Get help if your pain continues or becomes worse, your knee swells, or you cannot move your knee.  This information is not intended to replace advice given to you by your health care provider. Make sure you discuss any questions you have with your health care provider.  Document Released: 11/22/2006 Document Revised: 07/07/2017 Document Reviewed: 07/07/2017  Elsevier Interactive Patient Education  2019  [employer]vier Inc[employer]

## 2018-02-20 NOTE — Progress Notes (Signed)
Subjective:  Patient ID: Sylvia IslamBarbara J Holliman, female    DOB: 07/23/1960  Age: 58 y.o. MRN: [id number]  CC: Asthma   HPI Sylvia Hall is a 58 year old female with a history of  Asthma, chronic neck and lower back pain with associated radiculopathy (MRI of the c-spine and L spine from an outside hospital from 12/2006   revealed mild degenerative changes, annular bulges at L2-3,3-4,4-5 and neural foramina narrowing.C-spine reveals C3-4, 4-5 foraminal narrowing). Currently followed by Dr. Allena KatzPatel of pain management for her low back and neck pain and is also undergoing physical therapy after her left hand carpal tunnel release surgery.  Would need to undergo release surgery and right hand as well but would like to hold off at this time. Her asthma is controlled with no recent exacerbations.  She has noticed popping of her right knee and right ankle and sometimes has a 6 out of 10 pain but no swelling.  Pain does not radiate. She has also had headache and pressure in her head which is worse when she bends down.  At her last visit she was treated for sinusitis. Denies fevers, upper respiratory symptoms. Complains of hair thinning and brittle nails which have been present for the last couple of weeks; no known history of thyroid disorders.  Past Medical History:  Diagnosis Date  . Anxiety   . Arthritis   . Asthma   . Back pain, chronic   . Bilateral carpal tunnel syndrome   . Depression   . DVT (deep vein thrombosis) in pregnancy   . Environmental allergies   . Epilepsy (HCC)   . Fatigue   . GERD (gastroesophageal reflux disease)   . Headache    sinus  . History of kidney stones   . Mitral valve prolapse   . Palpitations   . Pericarditis   . Pneumonia   . PTSD (post-traumatic stress disorder)   . Stenosis of lumbosacral spine    numbness L hand and L leg  . Thrombophlebitis    Age 58  . Wears glasses    Past Surgical History:  Procedure Laterality Date  . ABDOMINAL HYSTERECTOMY       partial  . APPENDECTOMY    . BIOPSY BREAST     Right breast x 10  . BREAST LUMPECTOMY     Right breast  . CARPAL TUNNEL RELEASE Left 11/10/2017   Procedure: LEFT CARPAL TUNNEL RELEASE;  Surgeon: Cammy Copaean, Gregory Scott, MD;  [facility];  Service: Orthopedics;  Laterality: Left;  . CESAREAN SECTION     x 2  . NOSE SURGERY     s/p trauma  . OVARY SURGERY    . RHINOPLASTY    . TUBAL LIGATION     left tubal  . UTERINE FIBROID SURGERY      Allergies  Allergen Reactions  . Phenytoin Sodium Extended Other (See Comments)    seizures  . Ivp Dye [Iodinated Diagnostic Agents] Hives  . Levofloxacin Hives  . Sulfa Drugs Cross Reactors Hives  . Aspirin Other (See Comments)    Upset stomach, resolves with GERD meds   . Caffeine Other (See Comments)    Heart races  . Cymbalta [Duloxetine Hcl] Diarrhea and Nausea Only    Dizzy  . Penicillins Rash    Pt called in stating she  Was prescribed Pen VK in ED and developed a generalized raised rash.  She has taken amoxicillin and augmentin since this time and not had any problem  .  Pregabalin Rash  . Sodium Nitrate Other (See Comments)    headaches     Outpatient Medications Prior to Visit  Medication Sig Dispense Refill  . albuterol (PROVENTIL HFA;VENTOLIN HFA) 108 (90 Base) MCG/ACT inhaler Inhale 2 puffs into the lungs every 6 (six) hours as needed for wheezing or shortness of breath. 1 Inhaler 1  . albuterol (PROVENTIL) (2.5 MG/3ML) 0.083% nebulizer solution Take 3 mLs (2.5 mg total) by nebulization every 6 (six) hours as needed for wheezing or shortness of breath. 150 mL 1  . amitriptyline (ELAVIL) 25 MG tablet Take 1 tablet (25 mg total) by mouth at bedtime. 30 tablet 1  . cyclobenzaprine (FLEXERIL) 5 MG tablet Take 1 tablet (5 mg total) by mouth 2 (two) times daily as needed for muscle spasms. 30 tablet 0  . diclofenac (VOLTAREN) 50 MG EC tablet Take 1 tablet (50 mg total) by mouth 3 (three) times daily. 90 tablet 1  . fluticasone  (FLONASE) 50 MCG/ACT nasal spray Place 2 sprays into both nostrils daily. 16 g 1  . Gabapentin, Once-Daily, (GRALISE) 300 MG TABS Take 300 mg by mouth at bedtime. 30 tablet 1  . lidocaine (LIDODERM) 5 % Place 1 patch onto the skin daily. Remove & Discard patch within 12 hours or as directed by MD 30 patch 3  . metaxalone (SKELAXIN) 800 MG tablet Take 1 tablet (800 mg total) by mouth 3 (three) times daily. 90 tablet 1  . metoprolol succinate (TOPROL-XL) 50 MG 24 hr tablet Take 1 tablet (50 mg total) by mouth daily. Take with or immediately following a meal. 60 tablet 11  . Fluticasone-Salmeterol (ADVAIR) 250-50 MCG/DOSE AEPB Inhale 1 puff into the lungs 2 (two) times daily. 60 each 3  . rOPINIRole (REQUIP XL) 2 MG 24 hr tablet TAKE 1 TABLET (2 MG TOTAL) BY MOUTH AT BEDTIME. 30 tablet 0  . acetaminophen-codeine (TYLENOL #3) 300-30 MG tablet 1 po q 12hrs prn pain (Patient not taking: Reported on 01/20/2018) 45 tablet 0  . azithromycin (ZITHROMAX) 250 MG tablet 2 tabs p.o. x1, then 1 tab daily (Patient not taking: Reported on 02/20/2018) 6 tablet 0  . cetirizine (ZYRTEC) 10 MG tablet Take 1 tablet (10 mg total) by mouth daily. (Patient not taking: Reported on 02/20/2018) 30 tablet 1  . doxycycline (VIBRA-TABS) 100 MG tablet Take 1 tablet (100 mg total) by mouth 2 (two) times daily. (Patient not taking: Reported on 02/20/2018) 20 tablet 0  . HYDROcodone-acetaminophen (NORCO) 5-325 MG tablet Take 1 tablet by mouth every 6 (six) hours as needed for moderate pain. (Patient not taking: Reported on 01/20/2018) 30 tablet 0  . HYDROcodone-acetaminophen (NORCO/VICODIN) 5-325 MG tablet 1 po q 8 hrs prn pain (Patient not taking: Reported on 01/20/2018) 30 tablet 0  . metoprolol tartrate (LOPRESSOR) 25 MG tablet Take 0.5 tablets (12.5 mg total) by mouth as needed. For palpitations 30 tablet 3  . predniSONE (DELTASONE) 10 MG tablet 3 tabs PO daily x 2 days then 2 tabs x 2 days then 1 tab x 2 days (Patient not taking:  Reported on 02/20/2018) 12 tablet 0  . Vitamin D, Ergocalciferol, (DRISDOL) 50000 units CAPS capsule Take 1 capsule (50,000 Units total) by mouth every 7 (seven) days. (Patient not taking: Reported on 02/20/2018) 16 capsule 0   No facility-administered medications prior to visit.     ROS Review of Systems  Constitutional: Negative for activity change, appetite change and fatigue.  HENT: Negative for congestion, sinus pressure and sore throat.  Eyes: Negative for visual disturbance.  Respiratory: Negative for cough, chest tightness, shortness of breath and wheezing.   Cardiovascular: Negative for chest pain and palpitations.  Gastrointestinal: Negative for abdominal distention, abdominal pain and constipation.  Endocrine: Negative for polydipsia.  Genitourinary: Negative for dysuria and frequency.  Musculoskeletal:       See hpi  Skin: Negative for rash.  Neurological: Positive for headaches. Negative for tremors, light-headedness and numbness.  Hematological: Does not bruise/bleed easily.  Psychiatric/Behavioral: Negative for agitation and behavioral problems.    Objective:  BP 107/68   Pulse 71   Temp 97.9 F (36.6 C) (Oral)   Ht 5' 3.75" (1.619 m)   Wt 200 lb (90.7 kg)   SpO2 96%   BMI 34.60 kg/m   BP/Weight 02/20/2018 02/03/2018 01/20/2018  Systolic BP 107 117 126  Diastolic BP 68 79 78  Wt. (Lbs) 200 197.8 199.6  BMI 34.6 34.22 35.36  Some encounter information is confidential and restricted. Go to Review Flowsheets activity to see all data.      Physical Exam Constitutional:      Appearance: She is well-developed.  Cardiovascular:     Rate and Rhythm: Normal rate.     Heart sounds: Normal heart sounds. No murmur.  Pulmonary:     Effort: Pulmonary effort is normal.     Breath sounds: Normal breath sounds. No wheezing or rales.  Chest:     Chest wall: No tenderness.  Abdominal:     General: Bowel sounds are normal. There is no distension.     Palpations:  Abdomen is soft. There is no mass.     Tenderness: There is no abdominal tenderness.  Musculoskeletal: Normal range of motion.     Comments: Healed surgical scar on ventral aspect of left wrist Crepitus on range of motion of both knees, non-on range of motion of ankles No edema or tenderness of b/l ankles and knees  Neurological:     Mental Status: She is alert and oriented to person, place, and time.  Psychiatric:        Mood and Affect: Mood normal.        Behavior: Behavior normal.      Assessment & Plan:   1. Restless leg syndrome Stable - rOPINIRole (REQUIP XL) 2 MG 24 hr tablet; Take 1 tablet (2 mg total) by mouth at bedtime.  Dispense: 30 tablet; Refill: 6  2. Mild intermittent asthma without complication Stable - Fluticasone-Salmeterol (ADVAIR) 250-50 MCG/DOSE AEPB; Inhale 1 puff into the lungs 2 (two) times daily.  Dispense: 60 each; Refill: 6  3. Hair thinning Advised to use OTC Biotin - TSH - T4, free - CBC with Differential/Platelet  4. Chronic pain of right knee Weight loss will be beneficial  5. Degenerative disc disease, lumbar Followed by pain management Discussed water aerobics, yoga  Meds ordered this encounter  Medications  . rOPINIRole (REQUIP XL) 2 MG 24 hr tablet    Sig: Take 1 tablet (2 mg total) by mouth at bedtime.    Dispense:  30 tablet    Refill:  6  . Fluticasone-Salmeterol (ADVAIR) 250-50 MCG/DOSE AEPB    Sig: Inhale 1 puff into the lungs 2 (two) times daily.    Dispense:  60 each    Refill:  6    Follow-up: Return in about 1 month (around 03/23/2018) for complete physical exam.   Hoy RegisterEnobong Uzoma Vivona MD

## 2018-02-21 ENCOUNTER — Other Ambulatory Visit: Payer: Self-pay | Admitting: Physical Medicine & Rehabilitation

## 2018-02-21 ENCOUNTER — Ambulatory Visit: Payer: Medicaid Other | Admitting: Occupational Therapy

## 2018-02-21 ENCOUNTER — Other Ambulatory Visit: Payer: Self-pay

## 2018-02-21 DIAGNOSIS — R278 Other lack of coordination: Secondary | ICD-10-CM

## 2018-02-21 DIAGNOSIS — M6281 Muscle weakness (generalized): Secondary | ICD-10-CM

## 2018-02-21 DIAGNOSIS — R208 Other disturbances of skin sensation: Secondary | ICD-10-CM

## 2018-02-21 DIAGNOSIS — M79642 Pain in left hand: Secondary | ICD-10-CM | POA: Diagnosis not present

## 2018-02-21 DIAGNOSIS — M25642 Stiffness of left hand, not elsewhere classified: Secondary | ICD-10-CM

## 2018-02-21 DIAGNOSIS — M25632 Stiffness of left wrist, not elsewhere classified: Secondary | ICD-10-CM

## 2018-02-21 LAB — TSH: TSH: 2.42 u[IU]/mL (ref 0.450–4.500)

## 2018-02-21 LAB — CBC WITH DIFFERENTIAL/PLATELET
Basophils Absolute: 0 10*3/uL (ref 0.0–0.2)
Basos: 1 %
EOS (ABSOLUTE): 0.1 10*3/uL (ref 0.0–0.4)
Eos: 2 %
Hematocrit: 38.9 % (ref 34.0–46.6)
Hemoglobin: 13.3 g/dL (ref 11.1–15.9)
IMMATURE GRANS (ABS): 0 10*3/uL (ref 0.0–0.1)
Immature Granulocytes: 0 %
Lymphocytes Absolute: 1.6 10*3/uL (ref 0.7–3.1)
Lymphs: 34 %
MCH: 32.1 pg (ref 26.6–33.0)
MCHC: 34.2 g/dL (ref 31.5–35.7)
MCV: 94 fL (ref 79–97)
MONOS ABS: 0.5 10*3/uL (ref 0.1–0.9)
Monocytes: 11 %
NEUTROS PCT: 52 %
Neutrophils Absolute: 2.6 10*3/uL (ref 1.4–7.0)
PLATELETS: 165 10*3/uL (ref 150–450)
RBC: 4.14 x10E6/uL (ref 3.77–5.28)
RDW: 12.3 % (ref 11.7–15.4)
WBC: 4.8 10*3/uL (ref 3.4–10.8)

## 2018-02-21 LAB — T4, FREE: Free T4: 1.11 ng/dL (ref 0.82–1.77)

## 2018-02-21 NOTE — Telephone Encounter (Signed)
Yes, can refill.  Thanks.

## 2018-02-21 NOTE — Telephone Encounter (Signed)
Patient called and requested a refill of cyclobenzaprine.  No mention in previous note to use this medication. Unsure if ok to refill.  Please advise.

## 2018-02-21 NOTE — Therapy (Signed)
[facility] [address] [place], [place], [address] Phone: [phone]   Fax:  [phone]  Occupational Therapy Treatment  Patient Details  Name: Sylvia Hall MRN: [id number] Date of Birth: December 09, 1960 Referring Provider (OT): Dr. Marlou Sa   Encounter Date: 02/21/2018  OT End of Session - 02/21/18 1347    Visit Number  6    Number of Visits  17    Date for OT Re-Evaluation  03/09/17    Authorization Type  CAFA    Authorization Time Period  12/16-6/16/2020    OT Start Time  1317    OT Stop Time  1400    OT Time Calculation (min)  43 min    Activity Tolerance  Patient limited by pain    Behavior During Therapy  [facility] for tasks assessed/performed       Past Medical History:  Diagnosis Date  . Anxiety   . Arthritis   . Asthma   . Back pain, chronic   . Bilateral carpal tunnel syndrome   . Depression   . DVT (deep vein thrombosis) in pregnancy   . Environmental allergies   . Epilepsy ([place])   . Fatigue   . GERD (gastroesophageal reflux disease)   . Headache    sinus  . History of kidney stones   . Mitral valve prolapse   . Palpitations   . Pericarditis   . Pneumonia   . PTSD (post-traumatic stress disorder)   . Stenosis of lumbosacral spine    numbness L hand and L leg  . Thrombophlebitis    Age 58  . Wears glasses     Past Surgical History:  Procedure Laterality Date  . ABDOMINAL HYSTERECTOMY     partial  . APPENDECTOMY    . BIOPSY BREAST     Right breast x 10  . BREAST LUMPECTOMY     Right breast  . CARPAL TUNNEL RELEASE Left 11/10/2017   Procedure: LEFT CARPAL TUNNEL RELEASE;  Surgeon: Meredith Pel, MD;  Location: [facility];  Service: Orthopedics;  Laterality: Left;  . CESAREAN SECTION     x 2  . NOSE SURGERY     s/p trauma  . OVARY SURGERY    . RHINOPLASTY    . TUBAL LIGATION     left tubal  . UTERINE FIBROID SURGERY      There were no vitals filed for this visit.  Subjective  Assessment - 02/21/18 1357    Subjective   Pt reports increased pain today    Pertinent History  Carpal tunnel release LUE    Patient Stated Goals  to regain mobility and grip in left hand    Currently in Pain?  Yes    Pain Score  8     Pain Location  Generalized    Pain Descriptors / Indicators  Aching    Pain Type  Chronic pain    Pain Onset  More than a month ago    Pain Frequency  Constant    Aggravating Factors   weather    Pain Relieving Factors  meds, heat                treatment: Fluidotherapy x 9 mins to LUE for pain relief, no adverse reactions. Wrist flexion/ extension with 1 lbs weight x 10 reps followed by graded clothespins, for sustained pinch, then reviewed red putty exercises, min v.c              OT  Short Term Goals - 02/21/18 1348      OT SHORT TERM GOAL #1   Title  I with inital HEP    Time  4    Period  Weeks    Status  Achieved      OT SHORT TERM GOAL #2   Title  Pt will report pain less than or equal to 4/10 for ADLS.    Time  4    Period  Weeks    Status  On-going      OT SHORT TERM GOAL #3   Title  I with scar massage    Time  4    Period  Weeks    Status  Achieved      OT SHORT TERM GOAL #4   Title  Pt will demonstrate LUE wrist flexion/ extension WFLS for ADLs/IADLS.    Time  4    Period  Weeks    Status  On-going      OT SHORT TERM GOAL #5   Title  I with all basic ADLs    Time  4    Period  Weeks    Status  Partially Met   I with all ADLS except needs assist for brushing hair with RUE       OT Long Term Goals - 12/29/17 1310      OT LONG TERM GOAL #1   Title  I with updated HEP.    Time  8    Status  New    Target Date  02/27/18      OT LONG TERM GOAL #2   Title  Pt will demonstrate grip strength of at least 25 lbs for increased LUE functional use during ADLs.    Time  8    Period  Weeks    Status  New      OT LONG TERM GOAL #3   Title  Pt will demonstrate improved fine motor coordination for ADLS  as evidenced by decreasing 9 hole peg test by 10 secs    Baseline  RUE 31.18, LUE 57.25 secs    Time  8    Period  Weeks    Status  New      OT LONG TERM GOAL #4   Title  Pt will report improved ease with brushing hair, fastening buttons, and washing dishes using LUE.    Time  8    Period  Weeks    Status  New      OT LONG TERM GOAL #5   Title  Pt will perform home management and basic cooking modified independently    Time  8    Period  Weeks            Plan - 02/21/18 1347    Clinical Impression Statement  Pt is progressing towards goals. She demonstrates decreasing pain and increasing strength overall however pt's pain is increased today. She relates the pain to the weatther.    Occupational Profile and client history currently impacting functional performance  Pt was independent prior to surgery, she currently lives with her significant other who recently had an AKA. PMH: s/p L CTR 11/10/17, Hx of DJD, bilateral carpal tunnel, deperession, borderline peronality d/o, PTSD,     Occupational performance deficits (Please refer to evaluation for details):  ADL's;IADL's;Leisure;Play;Social Participation    Rehab Potential  Good    Current Impairments/barriers affecting progress:  pain, sensory impairment    OT Frequency  2x / week  OT Duration  8 weeks    OT Treatment/Interventions  Self-care/ADL training;Therapeutic exercise;Patient/family education;Splinting;Neuromuscular education;Paraffin;Moist Heat;Fluidtherapy;Energy conservation;Scar mobilization;Therapeutic activities;Cognitive remediation/compensation;Passive range of motion;Manual Therapy;DME and/or AE instruction;Contrast Bath;Ultrasound;Cryotherapy    Plan  continue to work towards short term goals, and long term goals.    OT Home Exercise Plan  tendon gliding, scar massage, wrist flexion/ extension    Consulted and Agree with Plan of Care  Patient       Patient will benefit from skilled therapeutic intervention in  order to improve the following deficits and impairments:  Increased edema, Impaired flexibility, Pain, Impaired sensation, Decreased coordination, Decreased activity tolerance, Decreased endurance, Decreased range of motion, Decreased strength, Impaired UE functional use, Impaired perceived functional ability, Decreased safety awareness, Decreased knowledge of precautions  Visit Diagnosis: Pain in left hand  Other lack of coordination  Muscle weakness (generalized)  Stiffness of left hand, not elsewhere classified  Stiffness of left wrist, not elsewhere classified  Other disturbances of skin sensation    Problem List Patient Active Problem List   Diagnosis Date Noted  . Chronic pain syndrome 10/07/2017  . Degenerative disc disease, lumbar 08/17/2017  . Hyperlipidemia 08/17/2017  . Left sided numbness 12/01/2013  . Acute sinusitis, unspecified 12/08/2012  . Muscle spasm of right leg 12/08/2012  . Acute upper respiratory infections of unspecified site 12/08/2012  . Severe episode of recurrent major depressive disorder ([place]) 12/07/2012  . Borderline personality disorder ([place]) 10/19/2012  . Acute posttraumatic stress disorder 08/24/2012  . Palpitations 08/16/2012  . Neuropathy 08/16/2012  . Restless leg syndrome 08/16/2012  . Routine gynecological examination 07/31/2012  . Neurogenic pain 04/12/2012  . Anxiety 11/03/2011  . Vitamin D deficiency 08/08/2011  . Chronic back pain 05/29/2011  . MVP (mitral valve prolapse) 05/29/2011  . Asthma 05/29/2011  . Tobacco user 05/29/2011  . Chronic mastitis 05/29/2011  . Allergic rhinitis 05/29/2011    RINE,KATHRYN 02/21/2018, 1:58 PM  [facility] [address] [place], [place], [address] Phone: [phone]   Fax:  [phone]  Name: Sylvia Hall MRN: [id number] Date of Birth: [birth year]

## 2018-02-22 MED ORDER — CYCLOBENZAPRINE HCL 5 MG PO TABS: 5.0000 mg | ORAL_TABLET | Freq: Two times a day (BID) | ORAL | 0 refills | Status: DC | PRN

## 2018-02-22 MED FILL — CYCLOBENZAPRINE 5 MG TABLET: 5 | 15 days supply | Qty: 30 | Fill #0

## 2018-02-22 NOTE — Telephone Encounter (Signed)
Done

## 2018-02-22 NOTE — Addendum Note (Signed)
Addended by: Angela Nevin D on: 02/22/2018 08:36 AM   Modules accepted: Orders

## 2018-02-23 ENCOUNTER — Ambulatory Visit: Payer: Medicaid Other | Admitting: Occupational Therapy

## 2018-02-24 ENCOUNTER — Telehealth: Payer: Self-pay

## 2018-02-24 NOTE — Telephone Encounter (Signed)
Patient was called and informed of lab results. 

## 2018-02-24 NOTE — Telephone Encounter (Signed)
-----   Message from Hoy Register, MD sent at 02/21/2018  2:18 PM EST ----- Please inform the patient that labs are normal. Thank you.

## 2018-02-27 MED FILL — GABAPENTIN 600 MG TABLET: 600 | 30 days supply | Qty: 135 | Fill #1

## 2018-02-28 ENCOUNTER — Encounter: Payer: Self-pay | Admitting: Occupational Therapy

## 2018-02-28 ENCOUNTER — Ambulatory Visit: Payer: Medicaid Other | Admitting: Occupational Therapy

## 2018-02-28 DIAGNOSIS — M79642 Pain in left hand: Secondary | ICD-10-CM | POA: Diagnosis not present

## 2018-02-28 DIAGNOSIS — M25642 Stiffness of left hand, not elsewhere classified: Secondary | ICD-10-CM

## 2018-02-28 DIAGNOSIS — M6281 Muscle weakness (generalized): Secondary | ICD-10-CM

## 2018-02-28 DIAGNOSIS — M25632 Stiffness of left wrist, not elsewhere classified: Secondary | ICD-10-CM

## 2018-02-28 DIAGNOSIS — R278 Other lack of coordination: Secondary | ICD-10-CM

## 2018-02-28 DIAGNOSIS — R208 Other disturbances of skin sensation: Secondary | ICD-10-CM

## 2018-02-28 NOTE — Therapy (Signed)
[facility] [address] [place], [place], [address] Phone: [phone]   Fax:  [phone]  Occupational Therapy Treatment  Patient Details  Name: Sylvia Hall MRN: [id number] Date of Birth: 1960-10-25 Referring Provider (OT): Dr. Marlou Sa   Encounter Date: 02/28/2018  OT End of Session - 02/28/18 1327    Visit Number  7    Number of Visits  17    Date for OT Re-Evaluation  03/09/17    Authorization Type  CAFA    Authorization Time Period  12/16-6/16/2020    OT Start Time  1324    OT Stop Time  1402    OT Time Calculation (min)  38 min    Activity Tolerance  Patient limited by pain    Behavior During Therapy  [facility] for tasks assessed/performed       Past Medical History:  Diagnosis Date  . Anxiety   . Arthritis   . Asthma   . Back pain, chronic   . Bilateral carpal tunnel syndrome   . Depression   . DVT (deep vein thrombosis) in pregnancy   . Environmental allergies   . Epilepsy ([place])   . Fatigue   . GERD (gastroesophageal reflux disease)   . Headache    sinus  . History of kidney stones   . Mitral valve prolapse   . Palpitations   . Pericarditis   . Pneumonia   . PTSD (post-traumatic stress disorder)   . Stenosis of lumbosacral spine    numbness L hand and L leg  . Thrombophlebitis    Age 58  . Wears glasses     Past Surgical History:  Procedure Laterality Date  . ABDOMINAL HYSTERECTOMY     partial  . APPENDECTOMY    . BIOPSY BREAST     Right breast x 10  . BREAST LUMPECTOMY     Right breast  . CARPAL TUNNEL RELEASE Left 11/10/2017   Procedure: LEFT CARPAL TUNNEL RELEASE;  Surgeon: Meredith Pel, MD;  Location: [facility];  Service: Orthopedics;  Laterality: Left;  . CESAREAN SECTION     x 2  . NOSE SURGERY     s/p trauma  . OVARY SURGERY    . RHINOPLASTY    . TUBAL LIGATION     left tubal  . UTERINE FIBROID SURGERY      There were no vitals filed for this visit.  Subjective  Assessment - 02/28/18 1326    Subjective   Pt reports that she drops things, but it has gotten more stronger.  Pt continues to report difficulty opening things and holding dishes.      Pertinent History  Carpal tunnel release LUE    Patient Stated Goals  to regain mobility and grip in left hand    Currently in Pain?  Yes    Pain Score  5     Pain Orientation  Left    Pain Descriptors / Indicators  Aching;Shooting    Pain Type  Chronic pain    Pain Onset  More than a month ago    Pain Frequency  Constant    Aggravating Factors   hold something heavy    Pain Relieving Factors  meds, heat          Fluidotherapy x 10 mins to LUE for pain relief, no adverse reactions.  Wrist flexion/ extension x15  With 1lb wt. UD/RD with 1 lbs weight x 10 reps   Functional reaching to place/remove  graded clothespins, for sustained pinch  Picking up blocks using gripper set on level 1 (black [place]) for incr grip strength  Picking up coins and stacking, then manipulating in hand to place in coin bank for incr coordination with min difficulty.  Checked progress towards goals and discussed progress.     Functional reaching to place/remove large cylinder object from overhead with min difficulty grasping and holding.   Recommended pt incr nondominant LUE use for reaching (with RUE support for safety prn) as pt reports that she anticipates upcoming R CTR and is primarily doing reaching with RUE now.        OT Short Term Goals - 02/28/18 1338      OT SHORT TERM GOAL #1   Title  I with inital HEP    Time  4    Period  Weeks    Status  Achieved      OT SHORT TERM GOAL #2   Title  Pt will report pain less than or equal to 4/10 for ADLS.    Time  4    Period  Weeks    Status  On-going      OT SHORT TERM GOAL #3   Title  I with scar massage    Time  4    Period  Weeks    Status  Achieved      OT SHORT TERM GOAL #4   Title  Pt will demonstrate LUE wrist flexion/ extension WFLS for  ADLs/IADLS.    Time  4    Period  Weeks    Status  Achieved   02/28/18:  flexion/ext 60*     OT SHORT TERM GOAL #5   Title  I with all basic ADLs    Time  4    Period  Weeks    Status  Partially Met   I with all ADLS except needs assist for brushing hair with RUE       OT Long Term Goals - 02/28/18 1330      OT LONG TERM GOAL #1   Title  I with updated HEP.    Time  8    Status  New      OT LONG TERM GOAL #2   Title  Pt will demonstrate grip strength of at least 25 lbs for increased LUE functional use during ADLs.    Time  8    Period  Weeks    Status  Achieved   02/28/18     OT LONG TERM GOAL #3   Title  Pt will demonstrate improved fine motor coordination for ADLS as evidenced by decreasing 9 hole peg test by 10 secs    Baseline  RUE 31.18, LUE 57.25 secs    Time  8    Period  Weeks    Status  Achieved   02/28/18:  31.59sec     OT LONG TERM GOAL #4   Title  Pt will report improved ease with brushing hair, fastening buttons, and washing dishes using LUE.    Time  8    Period  Weeks    Status  New      OT LONG TERM GOAL #5   Title  Pt will perform home management and basic cooking modified independently    Time  8    Period  Weeks            Plan - 02/28/18 1327    Clinical Impression Statement  Pt is progressing  towards goals with improved strength, coordination, and LUE functional use.    Occupational Profile and client history currently impacting functional performance  Pt was independent prior to surgery, she currently lives with her significant other who recently had an AKA. PMH: s/p L CTR 11/10/17, Hx of DJD, bilateral carpal tunnel, deperession, borderline peronality d/o, PTSD,     Occupational performance deficits (Please refer to evaluation for details):  ADL's;IADL's;Leisure;Play;Social Participation    Rehab Potential  Good    Current Impairments/barriers affecting progress:  pain, sensory impairment    OT Frequency  2x / week    OT Duration  8  weeks    OT Treatment/Interventions  Self-care/ADL training;Therapeutic exercise;Patient/family education;Splinting;Neuromuscular education;Paraffin;Moist Heat;Fluidtherapy;Energy conservation;Scar mobilization;Therapeutic activities;Cognitive remediation/compensation;Passive range of motion;Manual Therapy;DME and/or AE instruction;Contrast Bath;Ultrasound;Cryotherapy    Plan  continue to work towards short term goals, and long term goals. continue with strengthening and reaching with LUE    OT Home Exercise Plan  tendon gliding, scar massage, wrist flexion/ extension    Consulted and Agree with Plan of Care  Patient       Patient will benefit from skilled therapeutic intervention in order to improve the following deficits and impairments:  Increased edema, Impaired flexibility, Pain, Impaired sensation, Decreased coordination, Decreased activity tolerance, Decreased endurance, Decreased range of motion, Decreased strength, Impaired UE functional use, Impaired perceived functional ability, Decreased safety awareness, Decreased knowledge of precautions  Visit Diagnosis: Pain in left hand  Other lack of coordination  Muscle weakness (generalized)  Stiffness of left hand, not elsewhere classified  Stiffness of left wrist, not elsewhere classified  Other disturbances of skin sensation    Problem List Patient Active Problem List   Diagnosis Date Noted  . Chronic pain syndrome 10/07/2017  . Degenerative disc disease, lumbar 08/17/2017  . Hyperlipidemia 08/17/2017  . Left sided numbness 12/01/2013  . Acute sinusitis, unspecified 12/08/2012  . Muscle spasm of right leg 12/08/2012  . Acute upper respiratory infections of unspecified site 12/08/2012  . Severe episode of recurrent major depressive disorder ([place]) 12/07/2012  . Borderline personality disorder ([place]) 10/19/2012  . Acute posttraumatic stress disorder 08/24/2012  . Palpitations 08/16/2012  . Neuropathy 08/16/2012  . Restless  leg syndrome 08/16/2012  . Routine gynecological examination 07/31/2012  . Neurogenic pain 04/12/2012  . Anxiety 11/03/2011  . Vitamin D deficiency 08/08/2011  . Chronic back pain 05/29/2011  . MVP (mitral valve prolapse) 05/29/2011  . Asthma 05/29/2011  . Tobacco user 05/29/2011  . Chronic mastitis 05/29/2011  . Allergic rhinitis 05/29/2011    [facility] 02/28/2018, 3:32 PM  [facility] [address] [place], [place], [address] Phone: [phone]   Fax:  [phone]  Name: Sylvia Hall MRN: [id number] Date of Birth: 12-17-1960   Vianne Bulls, OTR/L [facility] [address] [place], [place]  [id number] [phone] phone [phone] 02/28/18 3:32 PM

## 2018-03-02 ENCOUNTER — Ambulatory Visit: Payer: Medicaid Other | Admitting: Occupational Therapy

## 2018-03-02 DIAGNOSIS — M79642 Pain in left hand: Secondary | ICD-10-CM | POA: Diagnosis not present

## 2018-03-02 DIAGNOSIS — M25642 Stiffness of left hand, not elsewhere classified: Secondary | ICD-10-CM

## 2018-03-02 DIAGNOSIS — M6281 Muscle weakness (generalized): Secondary | ICD-10-CM

## 2018-03-02 DIAGNOSIS — R278 Other lack of coordination: Secondary | ICD-10-CM

## 2018-03-02 NOTE — Therapy (Signed)
[facility] [address] [place], [place], [address] Phone: [phone]   Fax:  [phone]  Occupational Therapy Treatment  Patient Details  Name: Sylvia Hall MRN: [id number] Date of Birth: [birth year] Referring Provider (OT): Dr. Marlou Sa   Encounter Date: 03/02/2018  OT End of Session - 03/02/18 1648    Visit Number  8    Number of Visits  17    Date for OT Re-Evaluation  03/09/17    Authorization Type  CAFA    Authorization Time Period  12/16-6/16/2020    OT Start Time  1320    OT Stop Time  1400    OT Time Calculation (min)  40 min    Activity Tolerance  Patient limited by pain    Behavior During Therapy  [facility] for tasks assessed/performed       Past Medical History:  Diagnosis Date  . Anxiety   . Arthritis   . Asthma   . Back pain, chronic   . Bilateral carpal tunnel syndrome   . Depression   . DVT (deep vein thrombosis) in pregnancy   . Environmental allergies   . Epilepsy ([place])   . Fatigue   . GERD (gastroesophageal reflux disease)   . Headache    sinus  . History of kidney stones   . Mitral valve prolapse   . Palpitations   . Pericarditis   . Pneumonia   . PTSD (post-traumatic stress disorder)   . Stenosis of lumbosacral spine    numbness L hand and L leg  . Thrombophlebitis    Age 58  . Wears glasses     Past Surgical History:  Procedure Laterality Date  . ABDOMINAL HYSTERECTOMY     partial  . APPENDECTOMY    . BIOPSY BREAST     Right breast x 10  . BREAST LUMPECTOMY     Right breast  . CARPAL TUNNEL RELEASE Left 11/10/2017   Procedure: LEFT CARPAL TUNNEL RELEASE;  Surgeon: Meredith Pel, MD;  Location: [facility];  Service: Orthopedics;  Laterality: Left;  . CESAREAN SECTION     x 2  . NOSE SURGERY     s/p trauma  . OVARY SURGERY    . RHINOPLASTY    . TUBAL LIGATION     left tubal  . UTERINE FIBROID SURGERY      There were no vitals filed for this visit.  Subjective  Assessment - 03/02/18 1323    Pertinent History  Carpal tunnel release LUE 11/10/17    Patient Stated Goals  to regain mobility and grip in left hand    Currently in Pain?  Yes    Pain Score  5     Pain Location  Hand   and forearm   Pain Orientation  Left    Pain Descriptors / Indicators  Aching    Pain Type  Chronic pain    Pain Onset  More than a month ago    Pain Frequency  Constant    Aggravating Factors   hold something heavy    Pain Relieving Factors  meds, heat                   OT Treatments/Exercises (OP) - 03/02/18 0001      ADLs   ADL Comments  Discussion re: pt's symptoms and presentation - pt also has difficulty w/ ulnar nerve functions Lt hand including intrinsic + movement and finger abd/add. Pt's cervical MRI however did  not show signfiicant evidence of problems at neck level. Pt instructed to report symptoms to MD. Pt also complained of numbness in Rt thigh which just started - recommended discussing w/ MD      Exercises   Exercises  Wrist;Hand      Wrist Exercises   Other wrist exercises  Wrist flexion and extension w/ 2 lb weight x 10 reps each way      Hand Exercises   Other Hand Exercises  Reviewed pinching ex w/ putty and recommended pt pinch w/ ring and small finger together      Modalities   Modalities  Fluidotherapy      LUE Fluidotherapy   Number Minutes Fluidotherapy  10 Minutes    LUE Fluidotherapy Location  Hand;Wrist    Comments  at beginning of session to decr. pain/stiffness      Fine Motor Coordination (Hand/Wrist)   Fine Motor Coordination  In hand manipuation training    In Hand Manipulation Training  using coins for fingertip to/from palm translation               OT Short Term Goals - 02/28/18 1338      OT SHORT TERM GOAL #1   Title  I with inital HEP    Time  4    Period  Weeks    Status  Achieved      OT SHORT TERM GOAL #2   Title  Pt will report pain less than or equal to 4/10 for ADLS.    Time  4     Period  Weeks    Status  On-going      OT SHORT TERM GOAL #3   Title  I with scar massage    Time  4    Period  Weeks    Status  Achieved      OT SHORT TERM GOAL #4   Title  Pt will demonstrate LUE wrist flexion/ extension WFLS for ADLs/IADLS.    Time  4    Period  Weeks    Status  Achieved   02/28/18:  flexion/ext 60*     OT SHORT TERM GOAL #5   Title  I with all basic ADLs    Time  4    Period  Weeks    Status  Partially Met   I with all ADLS except needs assist for brushing hair with RUE       OT Long Term Goals - 02/28/18 1330      OT LONG TERM GOAL #1   Title  I with updated HEP.    Time  8    Status  New      OT LONG TERM GOAL #2   Title  Pt will demonstrate grip strength of at least 25 lbs for increased LUE functional use during ADLs.    Time  8    Period  Weeks    Status  Achieved   02/28/18     OT LONG TERM GOAL #3   Title  Pt will demonstrate improved fine motor coordination for ADLS as evidenced by decreasing 9 hole peg test by 10 secs    Baseline  RUE 31.18, LUE 57.25 secs    Time  8    Period  Weeks    Status  Achieved   02/28/18:  31.59sec     OT LONG TERM GOAL #4   Title  Pt will report improved ease with brushing hair, fastening buttons, and washing  dishes using LUE.    Time  8    Period  Weeks    Status  New      OT LONG TERM GOAL #5   Title  Pt will perform home management and basic cooking modified independently    Time  8    Period  Weeks            Plan - 03/02/18 1648    Clinical Impression Statement  Pt noted to have difficulty w/ intrinsic + movement and finger abd/add (ulnar n. functions) Lt hand. Pt is progressing towards goals with improved strength, coordination, and LUE functional use.    Occupational Profile and client history currently impacting functional performance  Pt was independent prior to surgery, she currently lives with her significant other who recently had an AKA. PMH: s/p L CTR 11/10/17, Hx of DJD, bilateral  carpal tunnel, deperession, borderline peronality d/o, PTSD,     Occupational performance deficits (Please refer to evaluation for details):  ADL's;IADL's;Leisure;Play;Social Participation    Rehab Potential  Good    Current Impairments/barriers affecting progress:  pain, sensory impairment    OT Frequency  2x / week    OT Duration  8 weeks    OT Treatment/Interventions  Self-care/ADL training;Therapeutic exercise;Patient/family education;Splinting;Neuromuscular education;Paraffin;Moist Heat;Fluidtherapy;Energy conservation;Scar mobilization;Therapeutic activities;Cognitive remediation/compensation;Passive range of motion;Manual Therapy;DME and/or AE instruction;Contrast Bath;Ultrasound;Cryotherapy    Plan  continue to work towards short term goals, and long term goals. continue with strengthening and reaching with LUE    Consulted and Agree with Plan of Care  Patient       Patient will benefit from skilled therapeutic intervention in order to improve the following deficits and impairments:  Increased edema, Impaired flexibility, Pain, Impaired sensation, Decreased coordination, Decreased activity tolerance, Decreased endurance, Decreased range of motion, Decreased strength, Impaired UE functional use, Impaired perceived functional ability, Decreased safety awareness, Decreased knowledge of precautions  Visit Diagnosis: Other lack of coordination  Muscle weakness (generalized)  Pain in left hand  Stiffness of left hand, not elsewhere classified    Problem List Patient Active Problem List   Diagnosis Date Noted  . Chronic pain syndrome 10/07/2017  . Degenerative disc disease, lumbar 08/17/2017  . Hyperlipidemia 08/17/2017  . Left sided numbness 12/01/2013  . Acute sinusitis, unspecified 12/08/2012  . Muscle spasm of right leg 12/08/2012  . Acute upper respiratory infections of unspecified site 12/08/2012  . Severe episode of recurrent major depressive disorder ([place]) 12/07/2012  .  Borderline personality disorder ([place]) 10/19/2012  . Acute posttraumatic stress disorder 08/24/2012  . Palpitations 08/16/2012  . Neuropathy 08/16/2012  . Restless leg syndrome 08/16/2012  . Routine gynecological examination 07/31/2012  . Neurogenic pain 04/12/2012  . Anxiety 11/03/2011  . Vitamin D deficiency 08/08/2011  . Chronic back pain 05/29/2011  . MVP (mitral valve prolapse) 05/29/2011  . Asthma 05/29/2011  . Tobacco user 05/29/2011  . Chronic mastitis 05/29/2011  . Allergic rhinitis 05/29/2011    Carey Bullocks, OTR/L 03/02/2018, 4:50 PM  [facility] [address] [place], [place], [address] Phone: [phone]   Fax:  [phone]  Name: BERENIS CORTER MRN: [id number] Date of Birth: [birth year]

## 2018-03-03 ENCOUNTER — Encounter: Payer: Self-pay | Admitting: Physical Medicine & Rehabilitation

## 2018-03-03 ENCOUNTER — Encounter: Payer: Medicaid Other | Attending: Physical Medicine & Rehabilitation | Admitting: Physical Medicine & Rehabilitation

## 2018-03-03 VITALS — BP 112/75 | HR 86 | Ht 63.0 in | Wt 203.0 lb

## 2018-03-03 DIAGNOSIS — E559 Vitamin D deficiency, unspecified: Secondary | ICD-10-CM | POA: Diagnosis not present

## 2018-03-03 DIAGNOSIS — F17210 Nicotine dependence, cigarettes, uncomplicated: Secondary | ICD-10-CM | POA: Diagnosis not present

## 2018-03-03 DIAGNOSIS — G40909 Epilepsy, unspecified, not intractable, without status epilepticus: Secondary | ICD-10-CM | POA: Insufficient documentation

## 2018-03-03 DIAGNOSIS — G894 Chronic pain syndrome: Secondary | ICD-10-CM

## 2018-03-03 DIAGNOSIS — Z8672 Personal history of thrombophlebitis: Secondary | ICD-10-CM | POA: Diagnosis not present

## 2018-03-03 DIAGNOSIS — I341 Nonrheumatic mitral (valve) prolapse: Secondary | ICD-10-CM | POA: Diagnosis not present

## 2018-03-03 DIAGNOSIS — Z825 Family history of asthma and other chronic lower respiratory diseases: Secondary | ICD-10-CM | POA: Diagnosis not present

## 2018-03-03 DIAGNOSIS — Z8249 Family history of ischemic heart disease and other diseases of the circulatory system: Secondary | ICD-10-CM | POA: Diagnosis not present

## 2018-03-03 DIAGNOSIS — M791 Myalgia, unspecified site: Secondary | ICD-10-CM

## 2018-03-03 DIAGNOSIS — J45909 Unspecified asthma, uncomplicated: Secondary | ICD-10-CM | POA: Insufficient documentation

## 2018-03-03 DIAGNOSIS — F4310 Post-traumatic stress disorder, unspecified: Secondary | ICD-10-CM | POA: Diagnosis not present

## 2018-03-03 DIAGNOSIS — Z6832 Body mass index (BMI) 32.0-32.9, adult: Secondary | ICD-10-CM | POA: Insufficient documentation

## 2018-03-03 DIAGNOSIS — G5603 Carpal tunnel syndrome, bilateral upper limbs: Secondary | ICD-10-CM

## 2018-03-03 DIAGNOSIS — M792 Neuralgia and neuritis, unspecified: Secondary | ICD-10-CM

## 2018-03-03 DIAGNOSIS — Z79899 Other long term (current) drug therapy: Secondary | ICD-10-CM | POA: Diagnosis not present

## 2018-03-03 DIAGNOSIS — G479 Sleep disorder, unspecified: Secondary | ICD-10-CM

## 2018-03-03 DIAGNOSIS — M545 Low back pain: Secondary | ICD-10-CM | POA: Diagnosis not present

## 2018-03-03 DIAGNOSIS — F419 Anxiety disorder, unspecified: Secondary | ICD-10-CM | POA: Diagnosis not present

## 2018-03-03 DIAGNOSIS — Z720 Tobacco use: Secondary | ICD-10-CM

## 2018-03-03 DIAGNOSIS — M48061 Spinal stenosis, lumbar region without neurogenic claudication: Secondary | ICD-10-CM | POA: Insufficient documentation

## 2018-03-03 DIAGNOSIS — M5136 Other intervertebral disc degeneration, lumbar region: Secondary | ICD-10-CM

## 2018-03-03 DIAGNOSIS — E669 Obesity, unspecified: Secondary | ICD-10-CM | POA: Diagnosis not present

## 2018-03-03 DIAGNOSIS — M542 Cervicalgia: Secondary | ICD-10-CM | POA: Diagnosis present

## 2018-03-03 DIAGNOSIS — Z833 Family history of diabetes mellitus: Secondary | ICD-10-CM | POA: Diagnosis not present

## 2018-03-03 DIAGNOSIS — Z818 Family history of other mental and behavioral disorders: Secondary | ICD-10-CM | POA: Insufficient documentation

## 2018-03-03 DIAGNOSIS — F329 Major depressive disorder, single episode, unspecified: Secondary | ICD-10-CM | POA: Insufficient documentation

## 2018-03-03 DIAGNOSIS — M461 Sacroiliitis, not elsewhere classified: Secondary | ICD-10-CM

## 2018-03-03 DIAGNOSIS — G5711 Meralgia paresthetica, right lower limb: Secondary | ICD-10-CM

## 2018-03-03 DIAGNOSIS — M51369 Other intervertebral disc degeneration, lumbar region without mention of lumbar back pain or lower extremity pain: Secondary | ICD-10-CM

## 2018-03-03 MED ORDER — CYCLOBENZAPRINE HCL 10 MG PO TABS: 10.0000 mg | ORAL_TABLET | Freq: Three times a day (TID) | ORAL | 1 refills | Status: DC | PRN

## 2018-03-03 MED ORDER — AMITRIPTYLINE HCL 50 MG PO TABS: 50.0000 mg | ORAL_TABLET | Freq: Every day | ORAL | 1 refills | Status: DC

## 2018-03-03 MED FILL — CYCLOBENZAPRINE 10 MG TAB: 10 | 30 days supply | Qty: 90 | Fill #0

## 2018-03-03 MED FILL — AMITRIPTYLINE HCL 50 MG TAB: 50 | 30 days supply | Qty: 30 | Fill #0

## 2018-03-03 NOTE — Progress Notes (Signed)
Subjective:    Patient ID: Sylvia Hall, female    DOB: 05/20/1960, 58 y.o.   MRN: [id number]  HPI 58 y/o female with pmh PTSD, lumbar stenosis, depression/anxiety present with generalized pain.   Initially stated: >>Left neck pain. Started 2012.  Denies inciting event.  Denies alleviating factors.  Moving exacerbates the pain.  All qualities of pain.  Radiating from hand proximally.  Constant.  Gabapentin, Robaxin, Ibu, Lidocaine patch without benefit.  Associated numbness and weakness.  Pain limits any activity. Denies falls.  Pt recently saw Ortho, notes reviewed, NCS/EMG showing b/l carpal tunnel and lumbar facet arthropathy.   Last clinic visit 02/03/18.  Since that time, pt states her right thigh is numb and had a migraine yesterday. She has not tried Gralise because Gabapentin was given instead.   She did not get Skelaxin due to cost pharmacy did not fill and instead was given Flexaril with minimal benefit. Benefit with Voltaren. She obtain labs. She states she never received a call from Neuropsych. She is still in OT for wrist.  Some benefit with Elavil. She continues to await for improvement in left wrist prior to right wrist surgery. She is awaiting to be released from OT prior to back injections. She is smoking the same amount.   Pain Inventory Average Pain 8 Pain Right Now 9 My pain is constant, sharp, burning, stabbing, tingling and aching  In the last 24 hours, has pain interfered with the following? General activity 7 Relation with others 8 Enjoyment of life 7 What TIME of day is your pain at its worst? all Sleep (in general) Poor  Pain is worse with: walking, bending, sitting, inactivity and standing Pain improves with: none Relief from Meds: 0  Mobility walk without assistance how many minutes can you walk? 5 ability to climb steps?  yes do you drive?  no  Function not employed: date last employed n/a I need assistance with the following:  dressing, meal prep,  household duties and shopping  Neuro/Psych weakness numbness tingling trouble walking dizziness  Prior Studies Any changes since last visit?  no  Physicians involved in your care Any changes since last visit?  no   Family History  Problem Relation Age of Onset  . Diabetes Mother   . Mitral valve prolapse Mother   . Asthma Mother   . CAD Mother [id number]  . Anxiety disorder Mother   . Mitral valve prolapse Sister   . Asthma Sister   . Mitral valve prolapse Son   . Alcohol abuse Son   . Diabetes Maternal Uncle   . Diabetes Maternal Grandmother   . Mitral valve prolapse Sister   . CAD Son [id number]       No details available  . Alcohol abuse Father   . Alcohol abuse Brother    Social History   Socioeconomic History  . Marital status: Single    Spouse name: Not on file  . Number of children: 2  . Years of education: Not on file  . Highest education level: Not on file  Occupational History  . Not on file  Social Needs  . Financial resource strain: Not on file  . Food insecurity:    Worry: Not on file    Inability: Not on file  . Transportation needs:    Medical: Not on file    Non-medical: Not on file  Tobacco Use  . Smoking status: Current Every Day Smoker    Packs/day: 1.00  Years: 12.00    Pack years: 12.00    Types: Cigarettes  . Smokeless tobacco: Never Used  Substance and Sexual Activity  . Alcohol use: No    Alcohol/week: 0.0 standard drinks  . Drug use: No  . Sexual activity: Not Currently    Birth control/protection: None  Lifestyle  . Physical activity:    Days per week: Not on file    Minutes per session: Not on file  . Stress: Not on file  Relationships  . Social connections:    Talks on phone: Not on file    Gets together: Not on file    Attends religious service: Not on file    Active member of club or organization: Not on file    Attends meetings of clubs or organizations: Not on file    Relationship status: Not on file  Other Topics  Concern  . Not on file  Social History Narrative   Lives alone.     Past Surgical History:  Procedure Laterality Date  . ABDOMINAL HYSTERECTOMY     partial  . APPENDECTOMY    . BIOPSY BREAST     Right breast x 10  . BREAST LUMPECTOMY     Right breast  . CARPAL TUNNEL RELEASE Left 11/10/2017   Procedure: LEFT CARPAL TUNNEL RELEASE;  Surgeon: Cammy Copaean, Gregory Scott, MD;  [facility];  Service: Orthopedics;  Laterality: Left;  . CESAREAN SECTION     x 2  . NOSE SURGERY     s/p trauma  . OVARY SURGERY    . RHINOPLASTY    . TUBAL LIGATION     left tubal  . UTERINE FIBROID SURGERY     Past Medical History:  Diagnosis Date  . Anxiety   . Arthritis   . Asthma   . Back pain, chronic   . Bilateral carpal tunnel syndrome   . Depression   . DVT (deep vein thrombosis) in pregnancy   . Environmental allergies   . Epilepsy ([facility])   . Fatigue   . GERD (gastroesophageal reflux disease)   . Headache    sinus  . History of kidney stones   . Mitral valve prolapse   . Palpitations   . Pericarditis   . Pneumonia   . PTSD (post-traumatic stress disorder)   . Stenosis of lumbosacral spine    numbness L hand and L leg  . Thrombophlebitis    Age 58  . Wears glasses    BP 112/75   Pulse 86   Ht 5\' 3"  (1.6 m)   Wt 203 lb (92.1 kg)   SpO2 98%   BMI 35.96 kg/m   Opioid Risk Score:   Fall Risk Score:  `1  Depression screen PHQ 2/9  Dep[facility] screen [facility] 02/20/2018 01/20/2018 01/04/2018 08/17/2017 06/15/2017 05/18/2017 04/07/2017  Decreased Interest 0 0 0 0 0 0 0  Down, Depressed, Hopeless 0 0 0 0 0 0 0  PHQ - 2 Score 0 0 0 0 0 0 0  Altered sleeping 0 - - 3 0 0 0  Tired, decreased energy 0 - - 1 0 0 0  Change in appetite 0 - - 0 0 0 0  Feeling bad or failure about yourself  0 - - 0 0 0 0  Trouble concentrating 0 - - 0 0 0 0  Moving slowly or fidgety/restless 0 - - 0 0 0 0  Suicidal thoughts 0 - - 0 0 0 0  PHQ-9 Score 0 - -  4 0 0 0     Review of Systems  Constitutional:  Positive for chills and fever.  HENT: Negative.   Eyes: Negative.   Respiratory: Positive for cough, shortness of breath and wheezing.   Cardiovascular: Negative.   Gastrointestinal: Negative.   Endocrine: Negative.   Genitourinary: Negative.   Musculoskeletal: Positive for arthralgias, back pain, gait problem, myalgias, neck pain and neck stiffness.  Skin: Negative.   Allergic/Immunologic: Negative.   Neurological: Positive for tremors, weakness and numbness.  Hematological: Negative.   Psychiatric/Behavioral: Negative.   All other systems reviewed and are negative.     Objective:   Physical Exam Gen: NAD. Vital signs reviewed HENT: Normocephalic, Atraumatic Eyes: EOMI. No discharge.  Cardio: RRR. No JVD. Pulm: B/l clear to auscultation.  Effort normal.  Abd: Nondistended. BS+ MSK:  Gait antalgic.   +TTP diffusely   +Fortin finger on right, FABERs limited due to pain  Numbness right anterior thigh  No edema.  Neuro:   Strength  4-/5 in all nmyotomes (pain inhibition) Skin: Warm and Dry. Intact Psych: Flat    Assessment & Plan:  58 y/o female with pmh PTSD, lumbar stenosis, depression/anxiety present with generalized pain.   1. Generalized pain  MRI C-spine from 09/2017 reviewed, showing mild facet arthropathy  MRI L-spine from 10/2017 reviewed, showing sacral spondylosis  No benefit with Heat/Cold, Robaxin, Mobic  Cont Lidoderm  Unable to tolerate Cymbalta, Lyrica, Gabapentin at higher doses, Baclofen  [facility] could not fill Skelaxin due to cost  Cont Gabapentin, now states it is beneficial  Will order Pregabalin 50 TID  Will order Flexaril 10 TID PRN  Will consider Sevella, likely cost limiting  Cont Diclofenac 50 TID  CRP/ANA WNL  Unable to afford TENS  Refered to Psychology, recommended follow up again, states she did not receive call abck  Currently in PT for wrist, will consider PT afterward   2. Sleep disturbance  See #1  Will increase Elavil  to 50mg  qhs  4. Obesity  Will consider referral to dietitian in future  5. Myalgia   Will consider trigger point injections when able to tolerate  6. B/l CTS  Per NCS/EMG, reviewed  S/p left CTS  Plan for right CTS after recovery from left CTS surgery  Cont brace  7. Low back pain, now with pain and numbness in right thigh  Plan for injections per Ortho in future after improvement in   8. Vit D deficiency  Cont supplement  9. Tobacco abuse  Encouraged abstinence again  10. Meralgia Paresthetica on right  See #1  Will schedule for injection  11. Right Sacroiliitis  Encouraged follow up with Ortho regarding injection    > 40 minutes spent with patient and friend with >25 minutes in counseling regarding above mention

## 2018-03-09 ENCOUNTER — Ambulatory Visit: Payer: Medicaid Other | Admitting: Occupational Therapy

## 2018-03-13 MED FILL — rOPINIRole HCL ER 2 MG TB24: 2 | 30 days supply | Qty: 30 | Fill #0

## 2018-03-13 MED FILL — METOPROLOL SUCCINATE ER 50: 50 | 30 days supply | Qty: 30 | Fill #11

## 2018-03-13 MED FILL — DICLOFENAC SOD EC 50 MG TAB: 50 | 30 days supply | Qty: 90 | Fill #1

## 2018-03-13 MED FILL — AMOXICILLIN 500 MG CAPSULE: 500 | 3 days supply | Qty: 12 | Fill #0

## 2018-03-15 MED FILL — DICLOFENAC SOD EC 50 MG TAB: 50 | 30 days supply | Qty: 90 | Fill #1

## 2018-03-17 ENCOUNTER — Encounter: Payer: No Typology Code available for payment source | Admitting: Physical Medicine & Rehabilitation

## 2018-03-31 ENCOUNTER — Ambulatory Visit: Payer: Self-pay | Admitting: Physical Medicine & Rehabilitation

## 2018-04-07 MED FILL — CYCLOBENZAPRINE 10 MG TAB: 10 | 30 days supply | Qty: 90 | Fill #1

## 2018-04-12 ENCOUNTER — Other Ambulatory Visit: Payer: Self-pay | Admitting: Cardiology

## 2018-04-12 MED FILL — METOPROLOL SUCCINATE ER 50: 50 | 30 days supply | Qty: 30 | Fill #0

## 2018-04-13 ENCOUNTER — Encounter: Payer: No Typology Code available for payment source | Admitting: Physical Medicine & Rehabilitation

## 2018-04-13 MED FILL — rOPINIRole HCL ER 2 MG TB24: 2 | 30 days supply | Qty: 30 | Fill #1

## 2018-04-24 ENCOUNTER — Telehealth: Payer: Self-pay

## 2018-04-24 MED ORDER — GABAPENTIN (ONCE-DAILY) 300 MG PO TABS: 300.0000 mg | ORAL_TABLET | Freq: Every day | ORAL | 1 refills | Status: DC

## 2018-04-24 MED ORDER — DICLOFENAC SODIUM 50 MG PO TBEC: 50.0000 mg | DELAYED_RELEASE_TABLET | Freq: Three times a day (TID) | ORAL | 1 refills | Status: DC

## 2018-04-24 MED FILL — GABAPENTIN 300 MG CAPSULE: 300 | 30 days supply | Qty: 30 | Fill #0

## 2018-04-24 MED FILL — DICLOFENAC SOD EC 50 MG TAB: 50 | 30 days supply | Qty: 90 | Fill #0

## 2018-04-24 NOTE — Telephone Encounter (Signed)
Called in.

## 2018-04-27 ENCOUNTER — Telehealth: Payer: Self-pay

## 2018-04-27 NOTE — Telephone Encounter (Signed)
Patient called stating she is suppose to be on Gabapentin 3 times daily. Changed at last appt. I don't see that was indicated. Need new prescription sent to [facility]

## 2018-04-27 NOTE — Telephone Encounter (Signed)
We discussed transitioning to Pregabalin 50 TID.  Thanks.

## 2018-04-27 NOTE — Telephone Encounter (Signed)
Patient states that she tried Lyrica and had an allergic reaction to it. She states she has  Gabapentin 600 mg one three times a day but she is running out and needs a prescription. Wants to stay on Gabapentin.

## 2018-04-27 NOTE — Telephone Encounter (Signed)
We can provide a refill.  Thanks. 

## 2018-04-28 ENCOUNTER — Ambulatory Visit: Payer: Self-pay | Admitting: Physical Medicine & Rehabilitation

## 2018-04-28 MED ORDER — GABAPENTIN 600 MG PO TABS: 600.0000 mg | ORAL_TABLET | Freq: Three times a day (TID) | ORAL | 0 refills | Status: DC

## 2018-04-28 NOTE — Addendum Note (Signed)
Addended by: Angela Nevin D on: 04/28/2018 01:59 PM   Modules accepted: Orders

## 2018-05-01 MED FILL — GABAPENTIN 600 MG TABLET: 600 | 30 days supply | Qty: 90 | Fill #0

## 2018-05-08 MED FILL — rOPINIRole HCL ER 2 MG TB24: 2 | 30 days supply | Qty: 30 | Fill #2

## 2018-05-08 MED FILL — ?METOPROLOL SUCC ER 50MG TA: 50 | 30 days supply | Qty: 30 | Fill #1

## 2018-05-10 ENCOUNTER — Encounter: Payer: Self-pay | Admitting: Family Medicine

## 2018-05-15 ENCOUNTER — Telehealth: Payer: Self-pay | Admitting: *Deleted

## 2018-05-15 DIAGNOSIS — J0110 Acute frontal sinusitis, unspecified: Secondary | ICD-10-CM

## 2018-05-15 MED ORDER — CYCLOBENZAPRINE HCL 10 MG PO TABS: 10.0000 mg | ORAL_TABLET | Freq: Three times a day (TID) | ORAL | 0 refills | Status: DC | PRN

## 2018-05-15 NOTE — Telephone Encounter (Signed)
Sylvia Hall needed a refill on her flexeril.(sent)  She is having trouble getting meds from [facility] (due to covid 19).  I informed her she needs a appt.  Please call her tomorrow and set up a web ex appt with Dr Allena Katz.

## 2018-05-17 MED FILL — CYCLOBENZAPRINE 10 MG TAB: 10 | 30 days supply | Qty: 90 | Fill #0

## 2018-05-17 NOTE — Telephone Encounter (Signed)
Patient has an appointment with Allena Katz on 4./9

## 2018-05-18 ENCOUNTER — Encounter: Payer: Self-pay | Admitting: Physical Medicine & Rehabilitation

## 2018-05-18 ENCOUNTER — Encounter
Payer: No Typology Code available for payment source | Attending: Physical Medicine & Rehabilitation | Admitting: Physical Medicine & Rehabilitation

## 2018-05-18 ENCOUNTER — Other Ambulatory Visit: Payer: Self-pay

## 2018-05-18 DIAGNOSIS — Z6832 Body mass index (BMI) 32.0-32.9, adult: Secondary | ICD-10-CM | POA: Insufficient documentation

## 2018-05-18 DIAGNOSIS — E669 Obesity, unspecified: Secondary | ICD-10-CM | POA: Insufficient documentation

## 2018-05-18 DIAGNOSIS — Z818 Family history of other mental and behavioral disorders: Secondary | ICD-10-CM | POA: Insufficient documentation

## 2018-05-18 DIAGNOSIS — Z8249 Family history of ischemic heart disease and other diseases of the circulatory system: Secondary | ICD-10-CM | POA: Insufficient documentation

## 2018-05-18 DIAGNOSIS — M542 Cervicalgia: Secondary | ICD-10-CM | POA: Insufficient documentation

## 2018-05-18 DIAGNOSIS — M545 Low back pain: Secondary | ICD-10-CM | POA: Insufficient documentation

## 2018-05-18 DIAGNOSIS — F4310 Post-traumatic stress disorder, unspecified: Secondary | ICD-10-CM | POA: Insufficient documentation

## 2018-05-18 DIAGNOSIS — I341 Nonrheumatic mitral (valve) prolapse: Secondary | ICD-10-CM | POA: Insufficient documentation

## 2018-05-18 DIAGNOSIS — M792 Neuralgia and neuritis, unspecified: Secondary | ICD-10-CM

## 2018-05-18 DIAGNOSIS — Z8672 Personal history of thrombophlebitis: Secondary | ICD-10-CM | POA: Insufficient documentation

## 2018-05-18 DIAGNOSIS — Z833 Family history of diabetes mellitus: Secondary | ICD-10-CM | POA: Insufficient documentation

## 2018-05-18 DIAGNOSIS — M5136 Other intervertebral disc degeneration, lumbar region: Secondary | ICD-10-CM

## 2018-05-18 DIAGNOSIS — F419 Anxiety disorder, unspecified: Secondary | ICD-10-CM | POA: Insufficient documentation

## 2018-05-18 DIAGNOSIS — M791 Myalgia, unspecified site: Secondary | ICD-10-CM | POA: Insufficient documentation

## 2018-05-18 DIAGNOSIS — M461 Sacroiliitis, not elsewhere classified: Secondary | ICD-10-CM

## 2018-05-18 DIAGNOSIS — G479 Sleep disorder, unspecified: Secondary | ICD-10-CM | POA: Insufficient documentation

## 2018-05-18 DIAGNOSIS — F329 Major depressive disorder, single episode, unspecified: Secondary | ICD-10-CM | POA: Insufficient documentation

## 2018-05-18 DIAGNOSIS — Z720 Tobacco use: Secondary | ICD-10-CM

## 2018-05-18 DIAGNOSIS — G40909 Epilepsy, unspecified, not intractable, without status epilepticus: Secondary | ICD-10-CM | POA: Insufficient documentation

## 2018-05-18 DIAGNOSIS — E559 Vitamin D deficiency, unspecified: Secondary | ICD-10-CM | POA: Insufficient documentation

## 2018-05-18 DIAGNOSIS — Z79899 Other long term (current) drug therapy: Secondary | ICD-10-CM | POA: Insufficient documentation

## 2018-05-18 DIAGNOSIS — G5711 Meralgia paresthetica, right lower limb: Secondary | ICD-10-CM

## 2018-05-18 DIAGNOSIS — M48061 Spinal stenosis, lumbar region without neurogenic claudication: Secondary | ICD-10-CM | POA: Insufficient documentation

## 2018-05-18 DIAGNOSIS — J45909 Unspecified asthma, uncomplicated: Secondary | ICD-10-CM | POA: Insufficient documentation

## 2018-05-18 DIAGNOSIS — Z825 Family history of asthma and other chronic lower respiratory diseases: Secondary | ICD-10-CM | POA: Insufficient documentation

## 2018-05-18 DIAGNOSIS — F17210 Nicotine dependence, cigarettes, uncomplicated: Secondary | ICD-10-CM | POA: Insufficient documentation

## 2018-05-18 DIAGNOSIS — G894 Chronic pain syndrome: Secondary | ICD-10-CM

## 2018-05-18 MED ORDER — AMITRIPTYLINE HCL 100 MG PO TABS: 100.0000 mg | ORAL_TABLET | Freq: Every day | ORAL | 1 refills | Status: DC

## 2018-05-18 MED FILL — AMITRIPTYLINE HCL 100 MG TA: 100 | 30 days supply | Qty: 30 | Fill #0

## 2018-05-18 NOTE — Progress Notes (Signed)
Subjective:    Patient ID: Sylvia Hall, female    DOB: 25-Oct-1960, 58 y.o.   MRN: [id number]  TELEHEALTH NOTE  Due to national recommendations of social distancing due to COVID 19, an audio/video telehealth visit is felt to be most appropriate for this patient at this time.  See Chart message from today for the patient's consent to telehealth from [facility].     I verified that I am speaking with the correct person using two identifiers.  Location of patient: Home Location of provider: Office Method of communication: Telephone Names of participants : Wadie Lessen scheduling, Suezanne Jacquet obtaining consent and vitals if available Established patient Time spent on call: 17 min  HPI  Female with pmh PTSD, lumbar stenosis, depression/anxiety present with generalized pain.   Initially stated: >>Left neck pain. Started 2012.  Denies inciting event.  Denies alleviating factors.  Moving exacerbates the pain.  All qualities of pain.  Radiating from hand proximally.  Constant.  Gabapentin, Robaxin, Ibu, Lidocaine patch without benefit.  Associated numbness and weakness.  Pain limits any activity. Denies falls.  Pt recently saw Ortho, notes reviewed, NCS/EMG showing b/l carpal tunnel and lumbar facet arthropathy.   Last clinic visit 03/03/2018.  Since that time, she continues to take [facility] with benefit.  She states she never received a call from Psychology. She continues to take Gabapentin, states she is allergic to Lyrica now. She denies benefit with Elavil.  She states before it is going to rain her ankle swell become red, with increasing pain. She states she did not go back for ESIs. She did not follow up with injection for meralgia paresthetica due to bronchitis.  She continues to smoke the same amount.    Pain Inventory Average Pain 7 Pain Right Now 7 My pain is constant, burning, tingling and throbbing  In the last 24 hours, has pain interfered  with the following? General activity 9 Relation with others 4 Enjoyment of life 1 What TIME of day is your pain at its worst? all Sleep (in general) Poor  Pain is worse with: walking, bending, sitting, inactivity and standing Pain improves with: none Relief from Meds: 0  Mobility walk without assistance ability to climb steps?  yes do you drive?  no  Function not employed: date last employed n/a disabled: date disabled application in process I need assistance with the following:  dressing, household duties and shopping  Neuro/Psych numbness tingling trouble walking spasms  Prior Studies Any changes since last visit?  no had CT release on left wrist but has not had right one done yet  Physicians involved in your care Any changes since last visit?  no Primary care Newlin Enobong   Family History  Problem Relation Age of Onset  . Diabetes Mother   . Mitral valve prolapse Mother   . Asthma Mother   . CAD Mother 67  . Anxiety disorder Mother   . Mitral valve prolapse Sister   . Asthma Sister   . Mitral valve prolapse Son   . Alcohol abuse Son   . Diabetes Maternal Uncle   . Diabetes Maternal Grandmother   . Mitral valve prolapse Sister   . CAD Son 6       No details available  . Alcohol abuse Father   . Alcohol abuse Brother    Social History   Socioeconomic History  . Marital status: Single    Spouse name: Not on file  . Number  of children: 2  . Years of education: Not on file  . Highest education level: Not on file  Occupational History  . Not on file  Social Needs  . Financial resource strain: Not on file  . Food insecurity:    Worry: Not on file    Inability: Not on file  . Transportation needs:    Medical: Not on file    Non-medical: Not on file  Tobacco Use  . Smoking status: Current Every Day Smoker    Packs/day: 1.00    Years: 12.00    Pack years: 12.00    Types: Cigarettes  . Smokeless tobacco: Never Used  Substance and Sexual  Activity  . Alcohol use: No    Alcohol/week: 0.0 standard drinks  . Drug use: No  . Sexual activity: Not Currently    Birth control/protection: None  Lifestyle  . Physical activity:    Days per week: Not on file    Minutes per session: Not on file  . Stress: Not on file  Relationships  . Social connections:    Talks on phone: Not on file    Gets together: Not on file    Attends religious service: Not on file    Active member of club or organization: Not on file    Attends meetings of clubs or organizations: Not on file    Relationship status: Not on file  Other Topics Concern  . Not on file  Social History Narrative   Lives alone.     Past Surgical History:  Procedure Laterality Date  . ABDOMINAL HYSTERECTOMY     partial  . APPENDECTOMY    . BIOPSY BREAST     Right breast x 10  . BREAST LUMPECTOMY     Right breast  . CARPAL TUNNEL RELEASE Left 11/10/2017   Procedure: LEFT CARPAL TUNNEL RELEASE;  Surgeon: Cammy Copa, MD;  Location: [facility] OR;  Service: Orthopedics;  Laterality: Left;  . CESAREAN SECTION     x 2  . NOSE SURGERY     s/p trauma  . OVARY SURGERY    . RHINOPLASTY    . TUBAL LIGATION     left tubal  . UTERINE FIBROID SURGERY     Past Medical History:  Diagnosis Date  . Anxiety   . Arthritis   . Asthma   . Back pain, chronic   . Bilateral carpal tunnel syndrome   . Depression   . DVT (deep vein thrombosis) in pregnancy   . Environmental allergies   . Epilepsy (HCC)   . Fatigue   . GERD (gastroesophageal reflux disease)   . Headache    sinus  . History of kidney stones   . Mitral valve prolapse   . Palpitations   . Pericarditis   . Pneumonia   . PTSD (post-traumatic stress disorder)   . Stenosis of lumbosacral spine    numbness L hand and L leg  . Thrombophlebitis    Age 55  . Wears glasses    There were no vitals taken for this visit.  Opioid Risk Score:   Fall Risk Score:  `1  Depression screen PHQ 2/9  Depression screen [facility]  2/9 05/18/2018 02/20/2018 01/20/2018 01/04/2018 08/17/2017 06/15/2017 05/18/2017  Decreased Interest 0 0 0 0 0 0 0  Down, Depressed, Hopeless 0 0 0 0 0 0 0  PHQ - 2 Score 0 0 0 0 0 0 0  Altered sleeping - 0 - - 3 0 0  Tired, decreased energy - 0 - - 1 0 0  Change in appetite - 0 - - 0 0 0  Feeling bad or failure about yourself  - 0 - - 0 0 0  Trouble concentrating - 0 - - 0 0 0  Moving slowly or fidgety/restless - 0 - - 0 0 0  Suicidal thoughts - 0 - - 0 0 0  PHQ-9 Score - 0 - - 4 0 0     Review of Systems  Constitutional: Negative.   HENT: Positive for congestion and sinus pressure.        Seasonal allergies  Eyes: Negative.   Respiratory: Positive for cough, shortness of breath and wheezing.   Cardiovascular: Negative.   Gastrointestinal: Negative.   Endocrine: Negative.   Genitourinary: Negative.   Musculoskeletal: Positive for arthralgias, back pain, gait problem, myalgias, neck pain and neck stiffness.  Skin: Negative.   Allergic/Immunologic:       Seasonal allergies  Neurological: Positive for numbness.       Tingling  Hematological: Negative.   Psychiatric/Behavioral: Negative.   All other systems reviewed and are negative.     Objective:   Physical Exam Gen: NAD. Pulm: Effort normal Neuro: Alert and oriented    Assessment & Plan:  58 y/o female with pmh PTSD, lumbar stenosis, depression/anxiety present with generalized pain.   1. Generalized pain  MRI C-spine from 09/2017 reviewed, showing mild facet arthropathy  MRI L-spine from 10/2017 reviewed, showing sacral spondylosis  No benefit with Heat/Cold, Robaxin, Mobic  Now states allergic to Lyrica  Cont Lidoderm  Unable to tolerate Cymbalta, Lyrica, Gabapentin at higher doses, Baclofen  [facility] could not fill Skelaxin due to cost  Cont Gabapentin 600 TID  Cont Flexaril 10 TID PRN  Will consider Sevella, likely cost limiting  Cont Diclofenac 50 TID  CRP/ANA WNL  Unable to afford TENS  Refered to  Psychology, recommended follow up again, states she did not receive call back again  Will refer for PT   2. Sleep disturbance  See #1  Will increase Elavil to 100mg  qhs  4. Obesity  Will consider referral to dietitian in future  5. Myalgia   Will consider trigger point injections when able to tolerate  6. B/l CTS  Per NCS/EMG, reviewed  S/p left CTS  Plan for right CTS after recovery from left CTS surgery  Cont brace  7. Low back pain, now with pain and numbness in right thigh  Plan for injections per Ortho in future   8. Vit D deficiency  Vit D 18 on 11/16/17, will order lab after CoVid  Cont supplement  9. Tobacco abuse  Encouraged abstinence again  10. Meralgia Paresthetica on right  See #1  Will schedule for injection, plan for injection CoVid  11. Right Sacroiliitis  Encouraged follow up with Ortho regarding injection after CoVid

## 2018-06-07 MED FILL — rOPINIRole HCL ER 2 MG TB24: 2 | 30 days supply | Qty: 30 | Fill #3

## 2018-06-07 MED FILL — DICLOFENAC SOD EC 50 MG TAB: 50 | 30 days supply | Qty: 90 | Fill #1

## 2018-06-07 MED FILL — ?METOPROLOL SUCC ER 50MG TA: 50 | 30 days supply | Qty: 30 | Fill #2

## 2018-06-15 ENCOUNTER — Encounter: Payer: Self-pay | Admitting: Physical Medicine & Rehabilitation

## 2018-06-15 ENCOUNTER — Encounter: Payer: Medicaid Other | Attending: Physical Medicine & Rehabilitation | Admitting: Physical Medicine & Rehabilitation

## 2018-06-15 ENCOUNTER — Other Ambulatory Visit: Payer: Self-pay

## 2018-06-15 VITALS — Ht 63.0 in | Wt 175.0 lb

## 2018-06-15 DIAGNOSIS — Z6832 Body mass index (BMI) 32.0-32.9, adult: Secondary | ICD-10-CM | POA: Insufficient documentation

## 2018-06-15 DIAGNOSIS — F4310 Post-traumatic stress disorder, unspecified: Secondary | ICD-10-CM | POA: Insufficient documentation

## 2018-06-15 DIAGNOSIS — M48061 Spinal stenosis, lumbar region without neurogenic claudication: Secondary | ICD-10-CM | POA: Insufficient documentation

## 2018-06-15 DIAGNOSIS — Z825 Family history of asthma and other chronic lower respiratory diseases: Secondary | ICD-10-CM | POA: Insufficient documentation

## 2018-06-15 DIAGNOSIS — F419 Anxiety disorder, unspecified: Secondary | ICD-10-CM

## 2018-06-15 DIAGNOSIS — G40909 Epilepsy, unspecified, not intractable, without status epilepticus: Secondary | ICD-10-CM | POA: Insufficient documentation

## 2018-06-15 DIAGNOSIS — J45909 Unspecified asthma, uncomplicated: Secondary | ICD-10-CM | POA: Insufficient documentation

## 2018-06-15 DIAGNOSIS — F329 Major depressive disorder, single episode, unspecified: Secondary | ICD-10-CM | POA: Insufficient documentation

## 2018-06-15 DIAGNOSIS — Z833 Family history of diabetes mellitus: Secondary | ICD-10-CM | POA: Insufficient documentation

## 2018-06-15 DIAGNOSIS — F17210 Nicotine dependence, cigarettes, uncomplicated: Secondary | ICD-10-CM | POA: Insufficient documentation

## 2018-06-15 DIAGNOSIS — G894 Chronic pain syndrome: Secondary | ICD-10-CM

## 2018-06-15 DIAGNOSIS — Z818 Family history of other mental and behavioral disorders: Secondary | ICD-10-CM | POA: Insufficient documentation

## 2018-06-15 DIAGNOSIS — E669 Obesity, unspecified: Secondary | ICD-10-CM | POA: Insufficient documentation

## 2018-06-15 DIAGNOSIS — M51369 Other intervertebral disc degeneration, lumbar region without mention of lumbar back pain or lower extremity pain: Secondary | ICD-10-CM

## 2018-06-15 DIAGNOSIS — Z8249 Family history of ischemic heart disease and other diseases of the circulatory system: Secondary | ICD-10-CM | POA: Insufficient documentation

## 2018-06-15 DIAGNOSIS — M545 Low back pain: Secondary | ICD-10-CM | POA: Insufficient documentation

## 2018-06-15 DIAGNOSIS — Z720 Tobacco use: Secondary | ICD-10-CM

## 2018-06-15 DIAGNOSIS — M542 Cervicalgia: Secondary | ICD-10-CM | POA: Insufficient documentation

## 2018-06-15 DIAGNOSIS — Z8672 Personal history of thrombophlebitis: Secondary | ICD-10-CM | POA: Insufficient documentation

## 2018-06-15 DIAGNOSIS — M5136 Other intervertebral disc degeneration, lumbar region: Secondary | ICD-10-CM

## 2018-06-15 DIAGNOSIS — I341 Nonrheumatic mitral (valve) prolapse: Secondary | ICD-10-CM | POA: Insufficient documentation

## 2018-06-15 DIAGNOSIS — G5711 Meralgia paresthetica, right lower limb: Secondary | ICD-10-CM

## 2018-06-15 DIAGNOSIS — Z79899 Other long term (current) drug therapy: Secondary | ICD-10-CM | POA: Insufficient documentation

## 2018-06-15 DIAGNOSIS — M792 Neuralgia and neuritis, unspecified: Secondary | ICD-10-CM

## 2018-06-15 DIAGNOSIS — M791 Myalgia, unspecified site: Secondary | ICD-10-CM

## 2018-06-15 DIAGNOSIS — G479 Sleep disorder, unspecified: Secondary | ICD-10-CM

## 2018-06-15 DIAGNOSIS — E559 Vitamin D deficiency, unspecified: Secondary | ICD-10-CM

## 2018-06-15 MED ORDER — CELECOXIB 100 MG PO CAPS: 100.0000 mg | ORAL_CAPSULE | Freq: Two times a day (BID) | ORAL | 1 refills | Status: DC

## 2018-06-15 MED ORDER — AMITRIPTYLINE HCL 50 MG PO TABS: 50.0000 mg | ORAL_TABLET | Freq: Every day | ORAL | 1 refills | Status: DC

## 2018-06-15 MED ORDER — LIDOCAINE 5 % EX OINT: 1.0000 "application " | TOPICAL_OINTMENT | CUTANEOUS | 0 refills | Status: AC | PRN

## 2018-06-15 MED FILL — CELECOXIB 100 MG CAPSULE: 100 | 30 days supply | Qty: 60 | Fill #0

## 2018-06-15 MED FILL — LIDOCAINE 5 % OINT: 5 | 30 days supply | Qty: 35 | Fill #0

## 2018-06-15 MED FILL — AMITRIPTYLINE HCL 50 MG TAB: 50 | 30 days supply | Qty: 30 | Fill #0

## 2018-06-15 NOTE — Progress Notes (Signed)
Subjective:    Patient ID: Sylvia IslamBarbara J Hovland, female    DOB: 09/18/1960, 58 y.o.   MRN: [id number]  TELEHEALTH NOTE  Due to national recommendations of social distancing due to COVID 19, an audio/video telehealth visit is felt to be most appropriate for this patient at this time.  See Chart message from today for the patient's consent to telehealth from [facility].     I verified that I am speaking with the correct person using two identifiers.  Location of patient: Home Location of provider: Office Method of communication: Telephone Names of participants : Wadie LessenLisa Kellner scheduling, Barbee ShropshireBruce Bright obtaining consent and vitals if available Established patient Time spent on call: 19 minutes  HPI  Female with pmh PTSD, lumbar stenosis, depression/anxiety present with generalized pain.   Initially stated: >>Left neck pain. Started 2012.  Denies inciting event.  Denies alleviating factors.  Moving exacerbates the pain.  All qualities of pain.  Radiating from hand proximally.  Constant.  Gabapentin, Robaxin, Ibu, Lidocaine patch without benefit.  Associated numbness and weakness.  Pain limits any activity. Denies falls.  Pt recently saw Ortho, notes reviewed, NCS/EMG showing b/l carpal tunnel and lumbar facet arthropathy.   Last clinic visit 05/18/2018.  Since that time, pt states she still has not heard back from Psychology. She states she was told she would be receiving a call, but did not.  Her PT was cancelled due to CoVid. She notes limited benefit with Elavil. She is not wearing there braces at night, states she wears them during the day.  She states she is going to follow up with Ortho after tomorrow. She states she is now smoking 1/2 PPD (from 1 PPD). Pain scores are improved, however, patient states she is not any better.   Pain Inventory Average Pain 6 Pain Right Now 6 My pain is constant, sharp, burning, dull, stabbing, tingling, aching and throbbing  In  the last 24 hours, has pain interfered with the following? General activity 9 Relation with others 4 Enjoyment of life 1 What TIME of day is your pain at its worst? all Sleep (in general) Fair  Pain is worse with: walking, bending, sitting, inactivity and standing Pain improves with: none Relief from Meds: 0  Mobility walk without assistance ability to climb steps?  yes do you drive?  no  Function not employed: date last employed n/a disabled: date disabled application in process I need assistance with the following:  dressing, household duties and shopping  Neuro/Psych weakness numbness tingling spasms  Prior Studies Any changes since last visit?  no had CT release on left wrist but has not had right one done yet  Physicians involved in your care Any changes since last visit?  no Primary care Newlin Enobong   Family History  Problem Relation Age of Onset  . Diabetes Mother   . Mitral valve prolapse Mother   . Asthma Mother   . CAD Mother [id number]  . Anxiety disorder Mother   . Mitral valve prolapse Sister   . Asthma Sister   . Mitral valve prolapse Son   . Alcohol abuse Son   . Diabetes Maternal Uncle   . Diabetes Maternal Grandmother   . Mitral valve prolapse Sister   . CAD Son 1523       No details available  . Alcohol abuse Father   . Alcohol abuse Brother    Social History   Socioeconomic History  . Marital status: Single  Spouse name: Not on file  . Number of children: 2  . Years of education: Not on file  . Highest education level: Not on file  Occupational History  . Not on file  Social Needs  . Financial resource strain: Not on file  . Food insecurity:    Worry: Not on file    Inability: Not on file  . Transportation needs:    Medical: Not on file    Non-medical: Not on file  Tobacco Use  . Smoking status: Current Every Day Smoker    Packs/day: 1.00    Years: 12.00    Pack years: 12.00    Types: Cigarettes  . Smokeless tobacco: Never  Used  Substance and Sexual Activity  . Alcohol use: No    Alcohol/week: 0.0 standard drinks  . Drug use: No  . Sexual activity: Not Currently    Birth control/protection: None  Lifestyle  . Physical activity:    Days per week: Not on file    Minutes per session: Not on file  . Stress: Not on file  Relationships  . Social connections:    Talks on phone: Not on file    Gets together: Not on file    Attends religious service: Not on file    Active member of club or organization: Not on file    Attends meetings of clubs or organizations: Not on file    Relationship status: Not on file  Other Topics Concern  . Not on file  Social History Narrative   Lives alone.     Past Surgical History:  Procedure Laterality Date  . ABDOMINAL HYSTERECTOMY     partial  . APPENDECTOMY    . BIOPSY BREAST     Right breast x 10  . BREAST LUMPECTOMY     Right breast  . CARPAL TUNNEL RELEASE Left 11/10/2017   Procedure: LEFT CARPAL TUNNEL RELEASE;  Surgeon: Cammy Copa, MD;  Location: [facility] OR;  Service: Orthopedics;  Laterality: Left;  . CESAREAN SECTION     x 2  . NOSE SURGERY     s/p trauma  . OVARY SURGERY    . RHINOPLASTY    . TUBAL LIGATION     left tubal  . UTERINE FIBROID SURGERY     Past Medical History:  Diagnosis Date  . Anxiety   . Arthritis   . Asthma   . Back pain, chronic   . Bilateral carpal tunnel syndrome   . Depression   . DVT (deep vein thrombosis) in pregnancy   . Environmental allergies   . Epilepsy (HCC)   . Fatigue   . GERD (gastroesophageal reflux disease)   . Headache    sinus  . History of kidney stones   . Mitral valve prolapse   . Palpitations   . Pericarditis   . Pneumonia   . PTSD (post-traumatic stress disorder)   . Stenosis of lumbosacral spine    numbness L hand and L leg  . Thrombophlebitis    Age 8  . Wears glasses    Ht 5\' 3"  (1.6 m)   Wt 175 lb (79.4 kg)   BMI 31.00 kg/m   Opioid Risk Score:   Fall Risk Score:  `1   Depression screen PHQ 2/9  Depression screen [facility] 2/9 05/18/2018 02/20/2018 01/20/2018 01/04/2018 08/17/2017 06/15/2017 05/18/2017  Decreased Interest 0 0 0 0 0 0 0  Down, Depressed, Hopeless 0 0 0 0 0 0 0  PHQ - 2 Score  0 0 0 0 0 0 0  Altered sleeping - 0 - - 3 0 0  Tired, decreased energy - 0 - - 1 0 0  Change in appetite - 0 - - 0 0 0  Feeling bad or failure about yourself  - 0 - - 0 0 0  Trouble concentrating - 0 - - 0 0 0  Moving slowly or fidgety/restless - 0 - - 0 0 0  Suicidal thoughts - 0 - - 0 0 0  PHQ-9 Score - 0 - - 4 0 0     Review of Systems  Constitutional: Negative.   HENT: Positive for congestion and sinus pressure.   Eyes: Negative.   Respiratory: Positive for cough, shortness of breath and wheezing.   Cardiovascular: Negative.   Gastrointestinal: Negative.   Endocrine: Negative.   Genitourinary: Negative.   Musculoskeletal: Positive for arthralgias, back pain, gait problem, myalgias, neck pain and neck stiffness.  Skin: Negative.   Allergic/Immunologic: Positive for environmental allergies.       Seasonal allergies  Neurological: Positive for weakness and numbness.       Tingling  Hematological: Negative.   Psychiatric/Behavioral: Negative.   All other systems reviewed and are negative.     Objective:   Physical Exam Gen: NAD. Pulm: Effort normal. Neuro: Alert and oriented    Assessment & Plan:  Female with pmh PTSD, lumbar stenosis, depression/anxiety present with generalized pain.   1. Generalized pain  MRI C-spine from 09/2017 reviewed, showing mild facet arthropathy  MRI L-spine from 10/2017 reviewed, showing sacral spondylosis  No benefit with Heat/Cold, Robaxin, Mobic, Diclofenac  Cont Lidoderm patch/ointment  Unable to tolerate Cymbalta, Lyrica, Gabapentin at higher doses, Baclofen, Lyrica  [facility] could not fill Skelaxin due to cost  Cont Gabapentin 600 TID  Cont Flexaril 10 TID PRN  Will consider Sevella, likely cost limiting  Will  order Celebrex 100 BID  CRP/ANA WNL  Will order TENS  Refered to Psychology, recommended follow up again, states she did not receive call back again - made referral again  Referred for PT, going to start once CoVid.  Avoid Tylenol >3grams per day  Follow up with Ortho regarding hands   2. Sleep disturbance  See #1  Will increase Elavil to  qhs, then wean if not effective  4. Obesity  Will consider referral to dietitian in future  5. Myalgia   Will consider trigger point injections when able to tolerate  6. B/l CTS  Per NCS/EMG, reviewed  S/p left CTS  Plan for right CTS after recovery from left CTS surgery  Cont brace  7. Low back pain, now with pain and numbness in right thigh  Plan for injections per Ortho in future   8. Vit D deficiency  Vit D 18 on 11/16/17, will order lab after CoVid  Cont supplement  9. Tobacco abuse  Encouraged abstinence again  10. Meralgia Paresthetica on right  See #1  Will schedule for injection, plan for injection CoVid  11. Right Sacroiliitis  Encouraged follow up with Ortho regarding injection after CoVid

## 2018-06-22 ENCOUNTER — Telehealth: Payer: Self-pay

## 2018-06-22 MED ORDER — CYCLOBENZAPRINE HCL 10 MG PO TABS: 10.0000 mg | ORAL_TABLET | Freq: Three times a day (TID) | ORAL | 0 refills | Status: DC | PRN

## 2018-06-22 MED FILL — ALBUTEROL SUL 2.5 MG/3 ML S: (2.5 MG/3ML | 13 days supply | Qty: 150 | Fill #1

## 2018-06-22 MED FILL — CYCLOBENZAPRINE 10 MG TAB: 10 | 30 days supply | Qty: 90 | Fill #0

## 2018-06-22 NOTE — Telephone Encounter (Signed)
Pt called stating she had a virtual visit with Dr. Allena Katz and Flexeril was not refilled. Sending refill to [facility] and [facility]

## 2018-06-25 ENCOUNTER — Encounter (HOSPITAL_COMMUNITY): Payer: Self-pay

## 2018-06-25 ENCOUNTER — Other Ambulatory Visit: Payer: Self-pay

## 2018-06-25 ENCOUNTER — Emergency Department (HOSPITAL_COMMUNITY): Payer: Medicaid Other

## 2018-06-25 ENCOUNTER — Emergency Department (HOSPITAL_COMMUNITY)
Admission: EM | Admit: 2018-06-25 | Discharge: 2018-06-26 | Disposition: A | Discharge status: Home / Self Care | Payer: Medicaid Other | Attending: Emergency Medicine | Admitting: Emergency Medicine

## 2018-06-25 DIAGNOSIS — G8929 Other chronic pain: Secondary | ICD-10-CM | POA: Insufficient documentation

## 2018-06-25 DIAGNOSIS — I341 Nonrheumatic mitral (valve) prolapse: Secondary | ICD-10-CM | POA: Insufficient documentation

## 2018-06-25 DIAGNOSIS — L539 Erythematous condition, unspecified: Secondary | ICD-10-CM | POA: Diagnosis present

## 2018-06-25 DIAGNOSIS — F17210 Nicotine dependence, cigarettes, uncomplicated: Secondary | ICD-10-CM | POA: Diagnosis not present

## 2018-06-25 DIAGNOSIS — Z86718 Personal history of other venous thrombosis and embolism: Secondary | ICD-10-CM | POA: Diagnosis not present

## 2018-06-25 DIAGNOSIS — Z79899 Other long term (current) drug therapy: Secondary | ICD-10-CM | POA: Insufficient documentation

## 2018-06-25 DIAGNOSIS — L03116 Cellulitis of left lower limb: Secondary | ICD-10-CM | POA: Insufficient documentation

## 2018-06-25 DIAGNOSIS — Z8709 Personal history of other diseases of the respiratory system: Secondary | ICD-10-CM | POA: Insufficient documentation

## 2018-06-25 DIAGNOSIS — L03115 Cellulitis of right lower limb: Secondary | ICD-10-CM | POA: Insufficient documentation

## 2018-06-25 DIAGNOSIS — R000 Tachycardia, unspecified: Secondary | ICD-10-CM | POA: Insufficient documentation

## 2018-06-25 LAB — CBC WITH DIFFERENTIAL/PLATELET
Abs Immature Granulocytes: 0.03 10*3/uL (ref 0.00–0.07)
Basophils Absolute: 0 10*3/uL (ref 0.0–0.1)
Basophils Relative: 0 %
Eosinophils Absolute: 0.1 10*3/uL (ref 0.0–0.5)
Eosinophils Relative: 1 %
HCT: 42 % (ref 36.0–46.0)
Hemoglobin: 14.1 g/dL (ref 12.0–15.0)
Immature Granulocytes: 0 %
Lymphocytes Relative: 24 %
Lymphs Abs: 2.1 10*3/uL (ref 0.7–4.0)
MCH: 31.6 pg (ref 26.0–34.0)
MCHC: 33.6 g/dL (ref 30.0–36.0)
MCV: 94.2 fL (ref 80.0–100.0)
Monocytes Absolute: 0.7 10*3/uL (ref 0.1–1.0)
Monocytes Relative: 8 %
Neutro Abs: 5.8 10*3/uL (ref 1.7–7.7)
Neutrophils Relative %: 67 %
Platelets: 172 10*3/uL (ref 150–400)
RBC: 4.46 MIL/uL (ref 3.87–5.11)
RDW: 12.4 % (ref 11.5–15.5)
WBC: 8.7 10*3/uL (ref 4.0–10.5)
nRBC: 0 % (ref 0.0–0.2)

## 2018-06-25 LAB — BASIC METABOLIC PANEL
Anion gap: 14 (ref 5–15)
BUN: 16 mg/dL (ref 6–20)
CO2: 23 mmol/L (ref 22–32)
Calcium: 9.5 mg/dL (ref 8.9–10.3)
Chloride: 100 mmol/L (ref 98–111)
Creatinine, Ser: 1.12 mg/dL — ABNORMAL HIGH (ref 0.44–1.00)
GFR calc Af Amer: >60 mL/min (ref >60)
GFR calc non Af Amer: 54 mL/min — ABNORMAL LOW (ref >60)
Glucose, Bld: 105 mg/dL — ABNORMAL HIGH (ref 70–99)
Potassium: 3.3 mmol/L — ABNORMAL LOW (ref 3.5–5.1)
Sodium: 137 mmol/L (ref 135–145)

## 2018-06-25 LAB — BRAIN NATRIURETIC PEPTIDE: B Natriuretic Peptide: 19.9 pg/mL (ref 0.0–100.0)

## 2018-06-25 MED ORDER — SODIUM CHLORIDE 0.9 % IV BOLUS: 1000.0000 mL | Freq: Once | INTRAVENOUS | Status: DC

## 2018-06-25 MED ORDER — HYDROCODONE-ACETAMINOPHEN 5-325 MG PO TABS
1.0000 | ORAL_TABLET | Freq: Once | ORAL | Status: AC
  Administered 2018-06-26: 1 via ORAL
  Filled 2018-06-25: qty 1

## 2018-06-25 NOTE — ED Triage Notes (Signed)
Onset 5 days ago bilateral LE redness, swelling, burning, and throbbing.

## 2018-06-25 NOTE — ED Provider Notes (Signed)
[facility]   CSN: [id number] Arrival date & time: 06/25/18  2049    History   Chief Complaint Chief Complaint  Patient presents with  . Leg Swelling    HPI Sylvia Hall is a 58 y.o. female with history of DVT in pregnancy, mitral valve prolapse, nephrolithiasis, GERD, chronic pain syndrome, hyperlipidemia, restless leg syndrome, palpitations, asthma presents for evaluation of acute onset, progressively worsening bilateral lower extremity pain described as a burning sensation, erythema, swelling for 5 days.  Reports that pain is primarily localized to the posterior aspect of the bilateral lower extremities around the ankles.  Pain and erythema have been progressively spreading upward posteriorly.  Has been elevating without relief of symptoms.  Today noted some feelings of malaise and "feeling off ".  Reports that symptoms began 2 days after using her significant other's razor to shave her legs.  Denies fever, nausea, vomiting, abdominal pain.  No chest pain.  Denies significant shortness of breath.  No recent travel or surgeries, no hemoptysis.  She does have a prior history of DVT in the setting of pregnancy for which she required anticoagulation but is not currently on any anticoagulants.  No history of hormonal placement therapy.  He is a current smoker. Also takes metoprolol for palpitations and has been compliant.       The history is provided by the patient.    Past Medical History:  Diagnosis Date  . Anxiety   . Arthritis   . Asthma   . Back pain, chronic   . Bilateral carpal tunnel syndrome   . Depression   . DVT (deep vein thrombosis) in pregnancy   . Environmental allergies   . Epilepsy (HCC)   . Fatigue   . GERD (gastroesophageal reflux disease)   . Headache    sinus  . History of kidney stones   . Mitral valve prolapse   . Palpitations   . Pericarditis   . Pneumonia   . PTSD (post-traumatic stress  disorder)   . Stenosis of lumbosacral spine    numbness L hand and L leg  . Thrombophlebitis    Age 58  . Wears glasses     Patient Active Problem List   Diagnosis Date Noted  . Chronic pain syndrome 10/07/2017  . Degenerative disc disease, lumbar 08/17/2017  . Hyperlipidemia 08/17/2017  . Left sided numbness 12/01/2013  . Acute sinusitis, unspecified 12/08/2012  . Muscle spasm of right leg 12/08/2012  . Acute upper respiratory infections of unspecified site 12/08/2012  . Severe episode of recurrent major depressive disorder (HCC) 12/07/2012  . Borderline personality disorder (HCC) 10/19/2012  . Acute posttraumatic stress disorder 08/24/2012  . Palpitations 08/16/2012  . Neuropathy 08/16/2012  . Restless leg syndrome 08/16/2012  . Routine gynecological examination 07/31/2012  . Neurogenic pain 04/12/2012  . Anxiety 11/03/2011  . Vitamin D deficiency 08/08/2011  . Chronic back pain 05/29/2011  . MVP (mitral valve prolapse) 05/29/2011  . Asthma 05/29/2011  . Tobacco user 05/29/2011  . Chronic mastitis 05/29/2011  . Allergic rhinitis 05/29/2011    Past Surgical History:  Procedure Laterality Date  . ABDOMINAL HYSTERECTOMY     partial  . APPENDECTOMY    . BIOPSY BREAST     Right breast x 10  . BREAST LUMPECTOMY     Right breast  . CARPAL TUNNEL RELEASE Left 11/10/2017   Procedure: LEFT CARPAL TUNNEL RELEASE;  Surgeon: Cammy Copaean, Gregory Scott, MD;  Location: [facility] OR;  Service: Orthopedics;  Laterality: Left;  . CESAREAN SECTION     x 2  . NOSE SURGERY     s/p trauma  . OVARY SURGERY    . RHINOPLASTY    . TUBAL LIGATION     left tubal  . UTERINE FIBROID SURGERY       OB History    Gravida  2   Para  2   Term  0   Preterm  2   AB      Living  2     SAB      TAB      Ectopic      Multiple      Live Births               Home Medications    Prior to Admission medications   Medication Sig Start Date End Date Taking? Authorizing Provider   albuterol (PROVENTIL HFA;VENTOLIN HFA) 108 (90 Base) MCG/ACT inhaler Inhale 2 puffs into the lungs every 6 (six) hours as needed for wheezing or shortness of breath. 11/16/17   Hoy Register, MD  albuterol (PROVENTIL) (2.5 MG/3ML) 0.083% nebulizer solution Take 3 mLs (2.5 mg total) by nebulization every 6 (six) hours as needed for wheezing or shortness of breath. 01/20/18   Marcine Matar, MD  amitriptyline (ELAVIL) 50 MG tablet Take 1 tablet (50 mg total) by mouth at bedtime. 06/15/18   Marcello Fennel, MD  celecoxib (CELEBREX) 100 MG capsule Take 1 capsule (100 mg total) by mouth 2 (two) times daily. 06/15/18   Marcello Fennel, MD  cephALEXin (KEFLEX) 500 MG capsule Take 1 capsule (500 mg total) by mouth 4 (four) times daily for 7 days. 06/26/18 07/03/18  Michela Pitcher A, PA-C  cyclobenzaprine (FLEXERIL) 10 MG tablet Take 1 tablet (10 mg total) by mouth 3 (three) times daily as needed for muscle spasms. 06/22/18   Marcello Fennel, MD  diclofenac (VOLTAREN) 50 MG EC tablet Take 1 tablet (50 mg total) by mouth 3 (three) times daily. 04/24/18   Marcello Fennel, MD  fluticasone (FLONASE) 50 MCG/ACT nasal spray Place 2 sprays into both nostrils daily. 01/20/18   Marcine Matar, MD  Fluticasone-Salmeterol (ADVAIR) 250-50 MCG/DOSE AEPB Inhale 1 puff into the lungs 2 (two) times daily. 02/20/18   Hoy Register, MD  gabapentin (NEURONTIN) 600 MG tablet Take 1 tablet (600 mg total) by mouth 3 (three) times daily. 04/28/18   Marcello Fennel, MD  lidocaine (LIDODERM) 5 % Place 1 patch onto the skin daily. Remove & Discard patch within 12 hours or as directed by MD 08/17/17   Hoy Register, MD  lidocaine (XYLOCAINE) 5 % ointment Apply 1 application topically as needed. 06/15/18   Marcello Fennel, MD  metoprolol succinate (TOPROL-XL) 50 MG 24 hr tablet TAKE 1 TABLET BY MOUTH DAILY IMMEDIATELY FOLLOWING A MEAL. 04/12/18   Rollene Rotunda, MD  metoprolol tartrate (LOPRESSOR) 25 MG tablet Take 0.5 tablets  (12.5 mg total) by mouth as needed. For palpitations 04/04/17 05/18/19  Rollene Rotunda, MD  pseudoephedrine-acetaminophen (TYLENOL SINUS) 30-500 MG TABS tablet Take 1 tablet by mouth every 4 (four) hours as needed.    [provider]  rOPINIRole (REQUIP XL) 2 MG 24 hr tablet Take 1 tablet (2 mg total) by mouth at bedtime. 02/20/18   Hoy Register, MD  Vitamin D, Ergocalciferol, (DRISDOL) 50000 units CAPS capsule Take 1 capsule (50,000 Units total) by mouth every 7 (seven) days. 11/17/17   Newlin,  Odette Horns, MD    Family History Family History  Problem Relation Age of Onset  . Diabetes Mother   . Mitral valve prolapse Mother   . Asthma Mother   . CAD Mother 57  . Anxiety disorder Mother   . Mitral valve prolapse Sister   . Asthma Sister   . Mitral valve prolapse Son   . Alcohol abuse Son   . Diabetes Maternal Uncle   . Diabetes Maternal Grandmother   . Mitral valve prolapse Sister   . CAD Son 92       No details available  . Alcohol abuse Father   . Alcohol abuse Brother     Social History Social History   Tobacco Use  . Smoking status: Current Every Day Smoker    Packs/day: 0.50    Years: 12.00    Pack years: 6.00    Types: Cigarettes  . Smokeless tobacco: Never Used  Substance Use Topics  . Alcohol use: No    Alcohol/week: 0.0 standard drinks  . Drug use: No     Allergies   Ivp dye [iodinated diagnostic agents]; Levofloxacin; Sulfa drugs cross reactors; Dilantin [phenytoin sodium extended]; Aspirin; Caffeine; Cymbalta [duloxetine hcl]; Penicillins; Pregabalin; and Sodium nitrate   Review of Systems Review of Systems  Constitutional: Positive for fatigue. Negative for chills and fever.  Respiratory: Negative for shortness of breath.   Cardiovascular: Positive for leg swelling. Negative for chest pain.  Gastrointestinal: Negative for abdominal pain, nausea and vomiting.  Skin: Positive for color change.  All other systems reviewed and are negative.     Physical Exam Updated Vital Signs BP 113/78 (BP Location: Right Arm)   Pulse 91   Temp 98.6 F (37 C) (Oral)   Resp 18   Ht 5\' 3"  (1.6 m)   Wt 86.2 kg   SpO2 99%   BMI 33.66 kg/m   Physical Exam Vitals signs and nursing note reviewed.  Constitutional:      General: She is not in acute distress.    Appearance: She is well-developed.  HENT:     Head: Normocephalic and atraumatic.  Eyes:     General:        Right eye: No discharge.        Left eye: No discharge.     Conjunctiva/sclera: Conjunctivae normal.  Neck:     Vascular: No JVD.     Trachea: No tracheal deviation.  Cardiovascular:     Rate and Rhythm: Regular rhythm. Tachycardia present.     Pulses: Normal pulses.     Comments: 2+ radial and DP/PT pulses bilaterally, Homans sign present bilaterally, no palpable cords, compartments are soft. Trace nonpitting edema of the bilateral lower extremities.  Pulmonary:     Effort: Pulmonary effort is normal.     Breath sounds: Normal breath sounds.  Abdominal:     General: Bowel sounds are normal. There is no distension.     Palpations: Abdomen is soft.     Tenderness: There is no abdominal tenderness. There is no guarding or rebound.  Musculoskeletal:        General: Swelling and tenderness present. No deformity.     Comments: Erythema noted to the posterior lower legs bilaterally from the ankle to the calves. No involvement of the feet.  5/5 strength of BLE major muscle groups.  Skin:    General: Skin is warm and dry.     Capillary Refill: Capillary refill takes less than 2 seconds.  Findings: No erythema.  Neurological:     Mental Status: She is alert.     Comments: Fluent speech, no facial droop, sensation intact to soft touch of bilateral lower extremities  Psychiatric:        Behavior: Behavior normal.      ED Treatments / Results  Labs (all labs ordered are listed, but only abnormal results are displayed) Labs Reviewed  BASIC METABOLIC PANEL - Abnormal;  Notable for the following components:      Result Value   Potassium 3.3 (*)    Glucose, Bld 105 (*)    Creatinine, Ser 1.12 (*)    GFR calc non Af Amer 54 (*)    All other components within normal limits  CBC WITH DIFFERENTIAL/PLATELET  BRAIN NATRIURETIC PEPTIDE  D-DIMER, QUANTITATIVE (NOT AT [facility])    EKG None  Radiology Dg Chest Port 1 View  Result Date: 06/25/2018 CLINICAL DATA:  Leg swelling EXAM: PORTABLE CHEST 1 VIEW COMPARISON:  None. FINDINGS: The heart size and mediastinal contours are within normal limits. Both lungs are clear. The visualized skeletal structures are unremarkable. IMPRESSION: No acute abnormality of the lungs in AP portable projection. Electronically Signed   By: Lauralyn Primes M.D.   On: 06/25/2018 23:03    Procedures Procedures (including critical care time)  Medications Ordered in ED Medications  HYDROcodone-acetaminophen (NORCO/VICODIN) 5-325 MG per tablet 1 tablet (1 tablet Oral Given 06/26/18 0020)  cephALEXin (KEFLEX) capsule 500 mg (500 mg Oral Given 06/26/18 0112)     Initial Impression / Assessment and Plan / ED Course  I have reviewed the triage vital signs and the nursing notes.  Pertinent labs & imaging results that were available during my care of the patient were reviewed by me and considered in my medical decision making (see chart for details).        Patient presenting for evaluation of bilateral lower extremity erythema swelling and burning for 5 days.  She is afebrile, initially tachycardic with resolution on subsequent reevaluation's and vital signs otherwise stable.  She is nontoxic in appearance.  Her symptoms began 2 days after using her significant other's razor to shave her legs.  Her presentation is suggestive of cellulitis however she does have a history of DVT.  She denies shortness of breath but does note some vague constitutional symptoms.  Lab work reviewed by me is reassuring with no leukocytosis, no anemia.  BNP is within  normal limits.  Her chest x-ray shows no signs of edema or consolidation and d-dimer is negative so I have a low suspicion of DVT or PE.  No evidence of CHF.  No evidence of septic joint.  Will discharge with course of Keflex.  Borders of erythema were marked with a skin pen.  Recommend follow-up with PCP for reevaluation of symptoms.  Discussed strict ED return precautions. Patient verbalized understanding of and agreement with plan and is safe for discharge home at this time.   Final Clinical Impressions(s) / ED Diagnoses   Final diagnoses:  Bilateral lower leg cellulitis    ED Discharge Orders         Ordered    cephALEXin (KEFLEX) 500 MG capsule  4 times daily,   Status:  Discontinued     06/26/18 0027    cephALEXin (KEFLEX) 500 MG capsule  4 times daily     06/26/18 0054           Jeanie Sewer, PA-C 06/26/18 2142    Derwood Kaplan, MD  06/27/18 2122  

## 2018-06-26 LAB — D-DIMER, QUANTITATIVE: D-Dimer, Quant: <0.27 ug/mL-FEU (ref 0.00–0.50)

## 2018-06-26 MED ORDER — CEPHALEXIN 250 MG PO CAPS
500.0000 mg | ORAL_CAPSULE | Freq: Once | ORAL | Status: AC
  Administered 2018-06-26: 500 mg via ORAL
  Filled 2018-06-26: qty 2

## 2018-06-26 MED ORDER — CEPHALEXIN 500 MG PO CAPS: 500.0000 mg | ORAL_CAPSULE | Freq: Four times a day (QID) | ORAL | 0 refills | Status: DC

## 2018-06-26 MED ORDER — CEPHALEXIN 500 MG PO CAPS: 500.0000 mg | ORAL_CAPSULE | Freq: Four times a day (QID) | ORAL | 0 refills | Status: AC

## 2018-06-26 MED FILL — CEPHALEXIN 500 MG CAPSULE: 500 | 28 days supply | Qty: 28 | Fill #0

## 2018-06-26 NOTE — Discharge Instructions (Addendum)
Please take all of your antibiotics until finished!   You may develop abdominal discomfort or diarrhea from the antibiotic.  You may help offset this with probiotics which you can buy or get in yogurt. Do not eat  or take the probiotics until 2 hours after your antibiotic.   Call your primary care doctor tomorrow to set up follow-up appointment for reevaluation.  Return to the emergency department immediately if any concerning signs or symptoms develop such as shortness of breath, high fevers, spread of redness up the leg, loss of pulses, or persistent vomiting.

## 2018-06-30 MED FILL — ALBUTEROL SULFATE HFA 108 (: 108 (90 BAS | 25 days supply | Qty: 9 | Fill #0

## 2018-07-10 MED FILL — rOPINIRole HCL ER 2 MG TB24: 2 | 30 days supply | Qty: 30 | Fill #4

## 2018-07-10 MED FILL — ?METOPROLOL SUCC ER 50MG TA: 50 | 30 days supply | Qty: 30 | Fill #3

## 2018-07-13 ENCOUNTER — Encounter: Payer: Self-pay | Admitting: Physical Medicine & Rehabilitation

## 2018-07-13 ENCOUNTER — Encounter: Payer: Medicaid Other | Attending: Physical Medicine & Rehabilitation | Admitting: Physical Medicine & Rehabilitation

## 2018-07-13 ENCOUNTER — Other Ambulatory Visit: Payer: Self-pay

## 2018-07-13 VITALS — Ht 63.0 in | Wt 185.0 lb

## 2018-07-13 DIAGNOSIS — G894 Chronic pain syndrome: Secondary | ICD-10-CM | POA: Diagnosis not present

## 2018-07-13 DIAGNOSIS — I341 Nonrheumatic mitral (valve) prolapse: Secondary | ICD-10-CM | POA: Insufficient documentation

## 2018-07-13 DIAGNOSIS — M461 Sacroiliitis, not elsewhere classified: Secondary | ICD-10-CM

## 2018-07-13 DIAGNOSIS — J45909 Unspecified asthma, uncomplicated: Secondary | ICD-10-CM | POA: Insufficient documentation

## 2018-07-13 DIAGNOSIS — F17210 Nicotine dependence, cigarettes, uncomplicated: Secondary | ICD-10-CM | POA: Insufficient documentation

## 2018-07-13 DIAGNOSIS — F4310 Post-traumatic stress disorder, unspecified: Secondary | ICD-10-CM | POA: Insufficient documentation

## 2018-07-13 DIAGNOSIS — M48061 Spinal stenosis, lumbar region without neurogenic claudication: Secondary | ICD-10-CM | POA: Insufficient documentation

## 2018-07-13 DIAGNOSIS — G5711 Meralgia paresthetica, right lower limb: Secondary | ICD-10-CM

## 2018-07-13 DIAGNOSIS — Z720 Tobacco use: Secondary | ICD-10-CM

## 2018-07-13 DIAGNOSIS — Z833 Family history of diabetes mellitus: Secondary | ICD-10-CM | POA: Insufficient documentation

## 2018-07-13 DIAGNOSIS — M792 Neuralgia and neuritis, unspecified: Secondary | ICD-10-CM

## 2018-07-13 DIAGNOSIS — E559 Vitamin D deficiency, unspecified: Secondary | ICD-10-CM | POA: Insufficient documentation

## 2018-07-13 DIAGNOSIS — Z79899 Other long term (current) drug therapy: Secondary | ICD-10-CM | POA: Insufficient documentation

## 2018-07-13 DIAGNOSIS — Z8249 Family history of ischemic heart disease and other diseases of the circulatory system: Secondary | ICD-10-CM | POA: Insufficient documentation

## 2018-07-13 DIAGNOSIS — M5136 Other intervertebral disc degeneration, lumbar region: Secondary | ICD-10-CM

## 2018-07-13 DIAGNOSIS — M542 Cervicalgia: Secondary | ICD-10-CM | POA: Insufficient documentation

## 2018-07-13 DIAGNOSIS — Z8672 Personal history of thrombophlebitis: Secondary | ICD-10-CM | POA: Insufficient documentation

## 2018-07-13 DIAGNOSIS — F419 Anxiety disorder, unspecified: Secondary | ICD-10-CM | POA: Insufficient documentation

## 2018-07-13 DIAGNOSIS — M545 Low back pain: Secondary | ICD-10-CM | POA: Insufficient documentation

## 2018-07-13 DIAGNOSIS — Z825 Family history of asthma and other chronic lower respiratory diseases: Secondary | ICD-10-CM | POA: Insufficient documentation

## 2018-07-13 DIAGNOSIS — M791 Myalgia, unspecified site: Secondary | ICD-10-CM | POA: Insufficient documentation

## 2018-07-13 DIAGNOSIS — G479 Sleep disorder, unspecified: Secondary | ICD-10-CM | POA: Insufficient documentation

## 2018-07-13 DIAGNOSIS — G40909 Epilepsy, unspecified, not intractable, without status epilepticus: Secondary | ICD-10-CM | POA: Insufficient documentation

## 2018-07-13 DIAGNOSIS — F329 Major depressive disorder, single episode, unspecified: Secondary | ICD-10-CM | POA: Insufficient documentation

## 2018-07-13 DIAGNOSIS — Z818 Family history of other mental and behavioral disorders: Secondary | ICD-10-CM | POA: Insufficient documentation

## 2018-07-13 DIAGNOSIS — Z6832 Body mass index (BMI) 32.0-32.9, adult: Secondary | ICD-10-CM | POA: Insufficient documentation

## 2018-07-13 DIAGNOSIS — E669 Obesity, unspecified: Secondary | ICD-10-CM | POA: Insufficient documentation

## 2018-07-13 MED ORDER — METHYLPREDNISOLONE 4 MG PO TBPK: ORAL_TABLET | ORAL | 0 refills | Status: DC

## 2018-07-13 MED FILL — METHYLPREDNISOLONE 4 MG TBP: 4 | 6 days supply | Qty: 21 | Fill #0

## 2018-07-13 NOTE — Progress Notes (Signed)
Subjective:    Patient ID: Sylvia Hall, female    DOB: 05/20/1960, 58 y.o.   MRN: [id number]  TELEHEALTH NOTE  Due to national recommendations of social distancing due to COVID 19, an audio/video telehealth visit is felt to be most appropriate for this patient at this time.  See Chart message from today for the patient's consent to telehealth from [facility].     I verified that I am speaking with the correct person using two identifiers.  Location of patient: Home Location of provider: Office Method of communication: Telephone Names of participants : Wadie Lessen scheduling, Barbee Shropshire obtaining consent and vitals if available Established patient Time spent on call: 21 minutes  HPI  Female with pmh PTSD, lumbar stenosis, depression/anxiety present with generalized pain.   Initially stated: >>Left neck pain. Started 2012.  Denies inciting event.  Denies alleviating factors.  Moving exacerbates the pain.  All qualities of pain.  Radiating from hand proximally.  Constant.  Gabapentin, Robaxin, Ibu, Lidocaine patch without benefit.  Associated numbness and weakness.  Pain limits any activity. Denies falls.  Pt recently saw Ortho, notes reviewed, NCS/EMG showing b/l carpal tunnel and lumbar facet arthropathy.   Last clinic visit 06/18/2018.  Since that time, she went to the ED for lower extremity swelling.  She was diagnosed with cellulitis and given a course of antibiotics. No improvement with abx, she is scheduled to see PCP tomorrow. She has edema distal to knees.  Communication exchanged between visits regarding meds. She is taking less Gabapentin.  She does not notice difference with Flexaril. No benefit with Celebrex. She has picked up prescription for TENS. She is awaiting Neuropsych appointment. She is still waiting therapies, maybe to start next week.  She is planning on seeing Ortho again for her CTS. No benefit with increase Elavil. She continues  to smoke.   Pain Inventory Average Pain 6 Pain Right Now 8 My pain is constant, sharp, burning, dull, stabbing, tingling, aching and throbbing  In the last 24 hours, has pain interfered with the following? General activity 9 Relation with others 4 Enjoyment of life 1 What TIME of day is your pain at its worst? all Sleep (in general) Fair  Pain is worse with: walking, bending, sitting, inactivity and standing Pain improves with: none Relief from Meds: 0  Mobility walk without assistance ability to climb steps?  no do you drive?  no  Function not employed: date last employed n/a disabled: date disabled application in process I need assistance with the following:  dressing, household duties and shopping  Neuro/Psych weakness numbness tingling spasms  Prior Studies Any changes since last visit?  no had CT release on left wrist but has not had right one done yet  Physicians involved in your care Any changes since last visit?  no Primary care Newlin Enobong   Family History  Problem Relation Age of Onset  . Diabetes Mother   . Mitral valve prolapse Mother   . Asthma Mother   . CAD Mother 8  . Anxiety disorder Mother   . Mitral valve prolapse Sister   . Asthma Sister   . Mitral valve prolapse Son   . Alcohol abuse Son   . Diabetes Maternal Uncle   . Diabetes Maternal Grandmother   . Mitral valve prolapse Sister   . CAD Son 60       No details available  . Alcohol abuse Father   . Alcohol abuse Brother  Social History   Socioeconomic History  . Marital status: Single    Spouse name: Not on file  . Number of children: 2  . Years of education: Not on file  . Highest education level: Not on file  Occupational History  . Not on file  Social Needs  . Financial resource strain: Not on file  . Food insecurity:    Worry: Not on file    Inability: Not on file  . Transportation needs:    Medical: Not on file    Non-medical: Not on file  Tobacco Use  .  Smoking status: Current Every Day Smoker    Packs/day: 0.50    Years: 12.00    Pack years: 6.00    Types: Cigarettes  . Smokeless tobacco: Never Used  Substance and Sexual Activity  . Alcohol use: No    Alcohol/week: 0.0 standard drinks  . Drug use: No  . Sexual activity: Not Currently    Birth control/protection: None  Lifestyle  . Physical activity:    Days per week: Not on file    Minutes per session: Not on file  . Stress: Not on file  Relationships  . Social connections:    Talks on phone: Not on file    Gets together: Not on file    Attends religious service: Not on file    Active member of club or organization: Not on file    Attends meetings of clubs or organizations: Not on file    Relationship status: Not on file  Other Topics Concern  . Not on file  Social History Narrative   Lives alone.     Past Surgical History:  Procedure Laterality Date  . ABDOMINAL HYSTERECTOMY     partial  . APPENDECTOMY    . BIOPSY BREAST     Right breast x 10  . BREAST LUMPECTOMY     Right breast  . CARPAL TUNNEL RELEASE Left 11/10/2017   Procedure: LEFT CARPAL TUNNEL RELEASE;  Surgeon: Cammy Copa, MD;  Location: [facility] OR;  Service: Orthopedics;  Laterality: Left;  . CESAREAN SECTION     x 2  . NOSE SURGERY     s/p trauma  . OVARY SURGERY    . RHINOPLASTY    . TUBAL LIGATION     left tubal  . UTERINE FIBROID SURGERY     Past Medical History:  Diagnosis Date  . Anxiety   . Arthritis   . Asthma   . Back pain, chronic   . Bilateral carpal tunnel syndrome   . Depression   . DVT (deep vein thrombosis) in pregnancy   . Environmental allergies   . Epilepsy (HCC)   . Fatigue   . GERD (gastroesophageal reflux disease)   . Headache    sinus  . History of kidney stones   . Mitral valve prolapse   . Palpitations   . Pericarditis   . Pneumonia   . PTSD (post-traumatic stress disorder)   . Stenosis of lumbosacral spine    numbness L hand and L leg  .  Thrombophlebitis    Age 10  . Wears glasses    Ht 5\' 3"  (1.6 m)   Wt 185 lb (83.9 kg)   BMI 32.77 kg/m   Opioid Risk Score:   Fall Risk Score:  `1  Depression screen PHQ 2/9  Depression screen [facility] 2/9 05/18/2018 02/20/2018 01/20/2018 01/04/2018 08/17/2017 06/15/2017 05/18/2017  Decreased Interest 0 0 0 0 0 0 0  Down,  Depressed, Hopeless 0 0 0 0 0 0 0  PHQ - 2 Score 0 0 0 0 0 0 0  Altered sleeping - 0 - - 3 0 0  Tired, decreased energy - 0 - - 1 0 0  Change in appetite - 0 - - 0 0 0  Feeling bad or failure about yourself  - 0 - - 0 0 0  Trouble concentrating - 0 - - 0 0 0  Moving slowly or fidgety/restless - 0 - - 0 0 0  Suicidal thoughts - 0 - - 0 0 0  PHQ-9 Score - 0 - - 4 0 0     Review of Systems  Constitutional: Negative.   HENT: Negative.   Eyes: Negative.   Respiratory: Negative.   Cardiovascular: Negative.   Gastrointestinal: Negative.   Endocrine: Negative.   Genitourinary: Negative.   Musculoskeletal: Positive for arthralgias, back pain, gait problem, myalgias, neck pain and neck stiffness.  Skin: Negative.   Allergic/Immunologic: Positive for environmental allergies.       Seasonal allergies  Neurological: Positive for weakness and numbness.       Tingling  Hematological: Negative.   Psychiatric/Behavioral: Negative.   All other systems reviewed and are negative.     Objective:   Physical Exam Gen: NAD. Pulm: Effort normal. Neuro: Alert and oriented    Assessment & Plan:  Female with pmh PTSD, lumbar stenosis, depression/anxiety present with generalized pain.   1. Generalized pain  MRI C-spine from 09/2017 reviewed, showing mild facet arthropathy  MRI L-spine from 10/2017 reviewed, showing sacral spondylosis  No benefit with Heat/Cold, Robaxin, Mobic, Diclofenac, Flexaril, Celebrex  Cont Lidoderm patch/ointment  Unable to tolerate Cymbalta, Lyrica, Gabapentin at higher doses, Baclofen  [facility] could not fill Skelaxin due to cost  D/c  Gabapentin   D/c Flexaril 10 TID PRN  Will consider Sevella, likely cost limiting  D/c Celebrex 100 BID  CRP/ANA WNL  Encouraged patient to pick up TENS  Refered to Psychology, recommended follow up again, states she did not receive call back again - made referral again, awaiting appointment  Referred for PT, going to start once CoVid.  Avoid Tylenol >3grams per day  Follow up with Ortho regarding hands, encouraged again  Will order Medrol dose pack   2. Sleep disturbance  See #1  Decrease Elavil to 75mg  qhs, plan to wean further  4. Obesity  Will consider referral to dietitian in future  5. Myalgia   Will consider trigger point injections when able to tolerate  6. B/l CTS  Per NCS/EMG, reviewed  S/p left CTS  Plan for right CTS after recovery from left CTS surgery  Cont brace  Follow up with Ortho  7. Low back pain, now with pain and numbness in right thigh  Plan for injections per Ortho in future   8. Vit D deficiency  Vit D 18 on 11/16/17, will order lab after CoVid  Cont supplement  9. Tobacco abuse  Encouraged abstinence again, now smoking 1/2 PPD  10. Meralgia Paresthetica on right  See #1  Will schedule for injection, plan for injection CoVid  11. Right Sacroiliitis  Encouraged follow up with Ortho regarding injection after CoVid  12. LE edema  Follow up with PCP  Hold off on Ibu for time being

## 2018-07-13 NOTE — Therapy (Signed)
[facility] [address] [place], [place], [address] Phone: [phone]   Fax:  [phone]  Patient Details  Name: TRISSA MOLINA MRN: [id number] Date of Birth: December 31, 1960 Referring Provider:  No ref. provider found  Encounter Date: 07/13/2018  OCCUPATIONAL THERAPY DISCHARGE SUMMARY  Visits from Start of Care: 8  Current functional level related to goals / functional outcomes:  See last treatment note on 03/02/18 for most recent goal update   Remaining deficits: unknown   Education / Equipment: HEP's  Plan: Patient agrees to discharge.  Patient goals were partially met. Patient is being discharged due to not returning since the last visit.  ?????            Carey Bullocks, OTR/L 07/13/2018, 7:51 AM  [facility] [address] [place], [place], [address] Phone: [phone]   Fax:  [phone]

## 2018-07-14 ENCOUNTER — Ambulatory Visit: Payer: Medicaid Other | Attending: Primary Care | Admitting: Primary Care

## 2018-07-14 ENCOUNTER — Other Ambulatory Visit: Payer: Self-pay

## 2018-07-14 ENCOUNTER — Encounter: Payer: Self-pay | Admitting: Primary Care

## 2018-07-14 VITALS — BP 109/70 | HR 83 | Temp 98.1°F | Ht 63.0 in | Wt 211.0 lb

## 2018-07-14 DIAGNOSIS — F4310 Post-traumatic stress disorder, unspecified: Secondary | ICD-10-CM | POA: Diagnosis not present

## 2018-07-14 DIAGNOSIS — F17200 Nicotine dependence, unspecified, uncomplicated: Secondary | ICD-10-CM

## 2018-07-14 DIAGNOSIS — Z79899 Other long term (current) drug therapy: Secondary | ICD-10-CM | POA: Diagnosis not present

## 2018-07-14 DIAGNOSIS — G40909 Epilepsy, unspecified, not intractable, without status epilepticus: Secondary | ICD-10-CM | POA: Insufficient documentation

## 2018-07-14 DIAGNOSIS — I341 Nonrheumatic mitral (valve) prolapse: Secondary | ICD-10-CM | POA: Diagnosis not present

## 2018-07-14 DIAGNOSIS — Z09 Encounter for follow-up examination after completed treatment for conditions other than malignant neoplasm: Secondary | ICD-10-CM

## 2018-07-14 DIAGNOSIS — E876 Hypokalemia: Secondary | ICD-10-CM | POA: Insufficient documentation

## 2018-07-14 DIAGNOSIS — G5601 Carpal tunnel syndrome, right upper limb: Secondary | ICD-10-CM | POA: Diagnosis not present

## 2018-07-14 DIAGNOSIS — J45909 Unspecified asthma, uncomplicated: Secondary | ICD-10-CM | POA: Insufficient documentation

## 2018-07-14 DIAGNOSIS — G8929 Other chronic pain: Secondary | ICD-10-CM | POA: Diagnosis not present

## 2018-07-14 DIAGNOSIS — R601 Generalized edema: Secondary | ICD-10-CM

## 2018-07-14 DIAGNOSIS — M2550 Pain in unspecified joint: Secondary | ICD-10-CM | POA: Diagnosis not present

## 2018-07-14 DIAGNOSIS — F17210 Nicotine dependence, cigarettes, uncomplicated: Secondary | ICD-10-CM | POA: Diagnosis not present

## 2018-07-14 DIAGNOSIS — M1990 Unspecified osteoarthritis, unspecified site: Secondary | ICD-10-CM | POA: Diagnosis not present

## 2018-07-14 MED ORDER — POTASSIUM CHLORIDE CRYS ER 20 MEQ PO TBCR: 20.0000 meq | EXTENDED_RELEASE_TABLET | Freq: Every day | ORAL | 3 refills | Status: DC

## 2018-07-14 MED ORDER — FUROSEMIDE 20 MG PO TABS: 20.0000 mg | ORAL_TABLET | Freq: Every day | ORAL | 0 refills | Status: DC

## 2018-07-14 MED FILL — FUROSEMIDE 20 MG TABS: 20 | 30 days supply | Qty: 30 | Fill #0

## 2018-07-14 MED FILL — POTASSIUM CL ER 20 MEQ TAB: 20 | 30 days supply | Qty: 30 | Fill #0

## 2018-07-14 NOTE — Progress Notes (Signed)
Patient still has swelling in legs.  Patient states that pain is mostly in ankles.(stabbing, burning)

## 2018-07-14 NOTE — Patient Instructions (Signed)

## 2018-07-14 NOTE — Progress Notes (Signed)
Established Patient Office Visit  Subjective:  Patient ID: Sylvia Hall, female    DOB: [birth year]  Age: 58 y.o. MRN: [id number]  CC:  Chief Complaint  Patient presents with   Hospitalization Follow-up    HPI Sylvia Hall presents for ED follow up for bilateral cellulitis and tx with Keflex. Past medical  Shown below.  Past Medical History:  Diagnosis Date   Anxiety    Arthritis    Asthma    Back pain, chronic    Bilateral carpal tunnel syndrome    Depression    DVT (deep vein thrombosis) in pregnancy    Environmental allergies    Epilepsy (HCC)    Fatigue    GERD (gastroesophageal reflux disease)    Headache    sinus   History of kidney stones    Mitral valve prolapse    Palpitations    Pericarditis    Pneumonia    PTSD (post-traumatic stress disorder)    Stenosis of lumbosacral spine    numbness L hand and L leg   Thrombophlebitis    Age 53   Wears glasses     Past Surgical History:  Procedure Laterality Date   ABDOMINAL HYSTERECTOMY     partial   APPENDECTOMY     BIOPSY BREAST     Right breast x 10   BREAST LUMPECTOMY     Right breast   CARPAL TUNNEL RELEASE Left 11/10/2017   Procedure: LEFT CARPAL TUNNEL RELEASE;  Surgeon: Cammy Copa, MD;  Location: [facility];  Service: Orthopedics;  Laterality: Left;   CESAREAN SECTION     x 2   NOSE SURGERY     s/p trauma   OVARY SURGERY     RHINOPLASTY     TUBAL LIGATION     left tubal   UTERINE FIBROID SURGERY      Family History  Problem Relation Age of Onset   Diabetes Mother    Mitral valve prolapse Mother    Asthma Mother    CAD Mother 51   Anxiety disorder Mother    Mitral valve prolapse Sister    Asthma Sister    Mitral valve prolapse Son    Alcohol abuse Son    Diabetes Maternal Uncle    Diabetes Maternal Grandmother    Mitral valve prolapse Sister    CAD Son 23       No details available   Alcohol abuse Father    Alcohol abuse  Brother     Social History   Socioeconomic History   Marital status: Single    Spouse name: Not on file   Number of children: 2   Years of education: Not on file   Highest education level: Not on file  Occupational History   Not on file  Social Needs   Financial resource strain: Not on file   Food insecurity:    Worry: Not on file    Inability: Not on file   Transportation needs:    Medical: Not on file    Non-medical: Not on file  Tobacco Use   Smoking status: Current Every Day Smoker    Packs/day: 0.50    Years: 12.00    Pack years: 6.00    Types: Cigarettes   Smokeless tobacco: Never Used  Substance and Sexual Activity   Alcohol use: No    Alcohol/week: 0.0 standard drinks   Drug use: No   Sexual activity: Not Currently    Birth control/protection: None  Lifestyle   Physical activity:    Days per week: Not on file    Minutes per session: Not on file   Stress: Not on file  Relationships   Social connections:    Talks on phone: Not on file    Gets together: Not on file    Attends religious service: Not on file    Active member of club or organization: Not on file    Attends meetings of clubs or organizations: Not on file    Relationship status: Not on file   Intimate partner violence:    Fear of current or ex partner: Not on file    Emotionally abused: Not on file    Physically abused: Not on file    Forced sexual activity: Not on file  Other Topics Concern   Not on file  Social History Narrative   Lives alone.      Outpatient Medications Prior to Visit  Medication Sig Dispense Refill   albuterol (PROVENTIL HFA;VENTOLIN HFA) 108 (90 Base) MCG/ACT inhaler Inhale 2 puffs into the lungs every 6 (six) hours as needed for wheezing or shortness of breath. 1 Inhaler 1   albuterol (PROVENTIL) (2.5 MG/3ML) 0.083% nebulizer solution Take 3 mLs (2.5 mg total) by nebulization every 6 (six) hours as needed for wheezing or shortness of breath. 150  mL 1   metoprolol succinate (TOPROL-XL) 50 MG 24 hr tablet TAKE 1 TABLET BY MOUTH DAILY IMMEDIATELY FOLLOWING A MEAL. 60 tablet 11   metoprolol tartrate (LOPRESSOR) 25 MG tablet Take 0.5 tablets (12.5 mg total) by mouth as needed. For palpitations 30 tablet 3   rOPINIRole (REQUIP XL) 2 MG 24 hr tablet Take 1 tablet (2 mg total) by mouth at bedtime. 30 tablet 6   amitriptyline (ELAVIL) 50 MG tablet Take 1 tablet (50 mg total) by mouth at bedtime. (Patient not taking: Reported on 07/14/2018) 30 tablet 1   celecoxib (CELEBREX) 100 MG capsule Take 1 capsule (100 mg total) by mouth 2 (two) times daily. (Patient not taking: Reported on 07/14/2018) 60 capsule 1   cyclobenzaprine (FLEXERIL) 10 MG tablet Take 1 tablet (10 mg total) by mouth 3 (three) times daily as needed for muscle spasms. (Patient not taking: Reported on 07/14/2018) 90 tablet 0   diclofenac (VOLTAREN) 50 MG EC tablet Take 1 tablet (50 mg total) by mouth 3 (three) times daily. (Patient not taking: Reported on 07/14/2018) 90 tablet 1   fluticasone (FLONASE) 50 MCG/ACT nasal spray Place 2 sprays into both nostrils daily. (Patient not taking: Reported on 07/14/2018) 16 g 1   Fluticasone-Salmeterol (ADVAIR) 250-50 MCG/DOSE AEPB Inhale 1 puff into the lungs 2 (two) times daily. (Patient not taking: Reported on 07/14/2018) 60 each 6   gabapentin (NEURONTIN) 600 MG tablet Take 1 tablet (600 mg total) by mouth 3 (three) times daily. (Patient not taking: Reported on 07/14/2018) 90 tablet 0   lidocaine (LIDODERM) 5 % Place 1 patch onto the skin daily. Remove & Discard patch within 12 hours or as directed by MD (Patient not taking: Reported on 07/14/2018) 30 patch 3   lidocaine (XYLOCAINE) 5 % ointment Apply 1 application topically as needed. (Patient not taking: Reported on 07/14/2018) 35.44 g 0   methylPREDNISolone (MEDROL DOSEPAK) 4 MG TBPK tablet As instruced (Patient not taking: Reported on 07/14/2018) 21 tablet 0   pseudoephedrine-acetaminophen (TYLENOL  SINUS) 30-500 MG TABS tablet Take 1 tablet by mouth every 4 (four) hours as needed.     Vitamin D, Ergocalciferol, (DRISDOL)  50000 units CAPS capsule Take 1 capsule (50,000 Units total) by mouth every 7 (seven) days. (Patient not taking: Reported on 07/14/2018) 16 capsule 0   No facility-administered medications prior to visit.     Allergies  Allergen Reactions   Ivp Dye [Iodinated Diagnostic Agents] Hives   Levofloxacin Hives   Sulfa Drugs Cross Reactors Hives   Dilantin [Phenytoin Sodium Extended] Hives and Itching   Aspirin Other (See Comments)    Upset stomach, resolves with GERD meds    Caffeine Other (See Comments)    Heart races   Cymbalta [Duloxetine Hcl] Diarrhea and Nausea Only    Dizzy   Penicillins Rash    Pt called in stating she  Was prescribed Pen VK in ED and developed a generalized raised rash.  She has taken amoxicillin and augmentin since this time and not had any problem   Pregabalin Rash   Sodium Nitrate Other (See Comments)    headaches    ROS Review of Systems  Constitutional: Negative.   HENT: Positive for congestion.   Eyes: Negative.   Respiratory: Negative.   Cardiovascular: Negative.   Gastrointestinal: Negative.   Endocrine: Negative.   Genitourinary: Negative.   Musculoskeletal: Positive for arthralgias.  Skin:       Red blanch white  Allergic/Immunologic: Positive for environmental allergies.  Neurological: Positive for headaches.  Psychiatric/Behavioral: Negative.       Objective:    Physical Exam  Constitutional: She is oriented to person, place, and time. She appears well-developed and well-nourished.  HENT:  Head: Normocephalic and atraumatic.  Neck: Normal range of motion. Neck supple.  Cardiovascular: Normal rate and regular rhythm.  Pulmonary/Chest: She has wheezes.  Abdominal: Soft. Bowel sounds are normal. She exhibits distension.  Musculoskeletal:     Comments: Right arm carpal tunnel  Neurological: She is  oriented to person, place, and time.  Skin: There is erythema. There is pallor.  Psychiatric: She has a normal mood and affect.    BP 109/70    Pulse 83    Temp 98.1 F (36.7 C) (Oral)    Ht 5\' 3"  (1.6 m)    Wt 211 lb (95.7 kg)    SpO2 96%    BMI 37.38 kg/m  Wt Readings from Last 3 Encounters:  07/14/18 211 lb (95.7 kg)  07/13/18 185 lb (83.9 kg)  06/25/18 190 lb (86.2 kg)     Health Maintenance Due  Topic Date Due   MAMMOGRAM  03/14/2010   COLONOSCOPY  03/14/2010   PAP SMEAR-Modifier  08/01/2015    There are no preventive care reminders to display for this patient.  Lab Results  Component Value Date   TSH 2.420 02/20/2018   Lab Results  Component Value Date   WBC 8.7 06/25/2018   HGB 14.1 06/25/2018   HCT 42.0 06/25/2018   MCV 94.2 06/25/2018   PLT 172 06/25/2018   Lab Results  Component Value Date   NA 137 06/25/2018   K 3.3 (L) 06/25/2018   CO2 23 06/25/2018   GLUCOSE 105 (H) 06/25/2018   BUN 16 06/25/2018   CREATININE 1.12 (H) 06/25/2018   BILITOT 0.3 05/19/2017   ALKPHOS 75 05/19/2017   AST 19 05/19/2017   ALT 15 05/19/2017   PROT 6.4 05/19/2017   ALBUMIN 4.2 05/19/2017   CALCIUM 9.5 06/25/2018   ANIONGAP 14 06/25/2018   Lab Results  Component Value Date   CHOL 214 (H) 05/19/2017   Lab Results  Component Value Date  HDL 62 05/19/2017   Lab Results  Component Value Date   LDLCALC 132 (H) 05/19/2017   Lab Results  Component Value Date   TRIG 98 05/19/2017   Lab Results  Component Value Date   CHOLHDL 3.5 05/19/2017   Lab Results  Component Value Date   HGBA1C 5.1 12/01/2013      Assessment & Plan:  Sylvia Hall was seen today for hospitalization follow-up.  Diagnoses and all orders for this visit:  Arthralgia, unspecified joint -     Uric Acid Painful and swollen ankles and toes  Hospital discharge follow-up ED records indicate no evidence of septic joint. Dx bilateral lower leg cellulitis. Discharge on Keflex.  Recommend  follow-up with PCP for reevaluation of symptoms.   Smoker Discussed cessation and the risk factors associated with continue to smoke  Other chronic pain Patient asked for pain meds for painful bilateral legs referred to d/w Dr. Allena Katz Followed by Dr. Allena Katz physical medicine and rehabilitation   Generalized edema Low dose of diuretic  -     Compression stockings  Carpal tunnel syndrome of right wrist Left arm surgery done by Dr.Dean previously  -     Ambulatory referral to Orthopedic Surgery  Hypokalemia Added K+ 20 meq QD  -     Comprehensive metabolic panel; Future  Other orders -     potassium chloride SA (K-DUR) 20 MEQ tablet; Take 1 tablet (20 mEq total) by mouth daily. -     furosemide (LASIX) 20 MG tablet; Take 1 tablet (20 mg total) by mouth daily.   Problem List Items Addressed This Visit    None    Visit Diagnoses    Arthralgia, unspecified joint    -  Primary   Relevant Orders   Uric Acid   Ambulatory referral to Orthopedic Surgery   Hospital discharge follow-up       Smoker       Other chronic pain       Generalized edema       Relevant Orders   Compression stockings   Carpal tunnel syndrome of right wrist       Relevant Orders   Ambulatory referral to Orthopedic Surgery   Hypokalemia       Relevant Orders   Comprehensive metabolic panel     Dr Allena Katz manages pain   Meds ordered this encounter  Medications   potassium chloride SA (K-DUR) 20 MEQ tablet    Sig: Take 1 tablet (20 mEq total) by mouth daily.    Dispense:  30 tablet    Refill:  3    Follow-up: Return for Wants to see Dr. Alvis Lemmings 1st avail.    Grayce Sessions, NP

## 2018-07-15 LAB — URIC ACID: Uric Acid: 5.9 mg/dL (ref 2.5–7.1)

## 2018-07-26 ENCOUNTER — Ambulatory Visit: Payer: Medicaid Other | Admitting: Orthopedic Surgery

## 2018-07-31 ENCOUNTER — Encounter: Payer: Self-pay | Admitting: Family Medicine

## 2018-07-31 ENCOUNTER — Ambulatory Visit: Payer: Medicaid Other | Attending: Family Medicine | Admitting: Family Medicine

## 2018-07-31 ENCOUNTER — Other Ambulatory Visit: Payer: Self-pay

## 2018-07-31 VITALS — BP 106/71 | HR 102 | Temp 98.4°F | Ht 63.0 in | Wt 207.6 lb

## 2018-07-31 DIAGNOSIS — G2581 Restless legs syndrome: Secondary | ICD-10-CM

## 2018-07-31 DIAGNOSIS — J328 Other chronic sinusitis: Secondary | ICD-10-CM | POA: Diagnosis not present

## 2018-07-31 DIAGNOSIS — E669 Obesity, unspecified: Secondary | ICD-10-CM

## 2018-07-31 DIAGNOSIS — G5601 Carpal tunnel syndrome, right upper limb: Secondary | ICD-10-CM | POA: Diagnosis not present

## 2018-07-31 DIAGNOSIS — G894 Chronic pain syndrome: Secondary | ICD-10-CM

## 2018-07-31 MED ORDER — CETIRIZINE HCL 10 MG PO TABS: 10.0000 mg | ORAL_TABLET | Freq: Every day | ORAL | 1 refills | Status: DC

## 2018-07-31 MED ORDER — ROPINIROLE HCL ER 2 MG PO TB24: 2.0000 mg | ORAL_TABLET | Freq: Every day | ORAL | 6 refills | Status: DC

## 2018-07-31 MED FILL — CETIRIZINE HCL 10 MG TABLET: 10 | 30 days supply | Qty: 30 | Fill #0

## 2018-07-31 NOTE — Progress Notes (Signed)
Patient is having pain in her neck,back ankles, knees and right hand.  Patient is having trouble with her sinus.

## 2018-07-31 NOTE — Progress Notes (Signed)
Subjective:  Patient ID: Sylvia Hall, female    DOB: [birth year]  Age: 58 y.o. MRN: [id number]  CC: Sinusitis and Back Pain   HPI Sylvia Hall is a 58 year old female with a history of  Asthma, chronic neck and lower back pain with associated radiculopathy (MRI of the c-spine and L spine from an outside hospital from 12/2006   revealed mild degenerative changes, annular bulges at L2-3,3-4,4-5 and neural foramina narrowing.C-spine reveals C3-4, 4-5 foraminal narrowing). Last month she had bilateral pedal edema and so her pain medications were discontinued by her rehab physician Dr. Posey Pronto.  She was also placed on Lasix, prednisone.  Prior to that on 06/25/2018 she was seen at the ED for bilateral lower extremity cellulitis, was thought to have low suspicion for DVT and was treated with Keflex.  Today she reports resolution of edema but currently does not have any pain medications.  She has pain in her posterior neck, left knee, ankles and right hand where she suffers from carpal tunnel syndrome and is using a brace.  She has had left carpal tunnel release surgery and has an upcoming appointment with her orthopedic. At her last visit with her rehab physician the plan was to refer her to a psychologist and commence PT She also complains of pain and pressure in her frontal sinuses, temples, beneath both eyes, postnasal drip and has been compliant with Flonase to no avail.  Her symptoms are in keeping with her allergies and she received allergy shots in the past.  Her allergy symptoms worsened when she moved from [place] to [place].  Past Medical History:  Diagnosis Date  . Anxiety   . Arthritis   . Asthma   . Back pain, chronic   . Bilateral carpal tunnel syndrome   . Depression   . DVT (deep vein thrombosis) in pregnancy   . Environmental allergies   . Epilepsy ([place])   . Fatigue   . GERD (gastroesophageal reflux disease)   . Headache    sinus  . History of kidney stones   .  Mitral valve prolapse   . Palpitations   . Pericarditis   . Pneumonia   . PTSD (post-traumatic stress disorder)   . Stenosis of lumbosacral spine    numbness L hand and L leg  . Thrombophlebitis    Age 49  . Wears glasses     Past Surgical History:  Procedure Laterality Date  . ABDOMINAL HYSTERECTOMY     partial  . APPENDECTOMY    . BIOPSY BREAST     Right breast x 10  . BREAST LUMPECTOMY     Right breast  . CARPAL TUNNEL RELEASE Left 11/10/2017   Procedure: LEFT CARPAL TUNNEL RELEASE;  Surgeon: Meredith Pel, MD;  Location: [facility];  Service: Orthopedics;  Laterality: Left;  . CESAREAN SECTION     x 2  . NOSE SURGERY     s/p trauma  . OVARY SURGERY    . RHINOPLASTY    . TUBAL LIGATION     left tubal  . UTERINE FIBROID SURGERY      Family History  Problem Relation Age of Onset  . Diabetes Mother   . Mitral valve prolapse Mother   . Asthma Mother   . CAD Mother 42  . Anxiety disorder Mother   . Mitral valve prolapse Sister   . Asthma Sister   . Mitral valve prolapse Son   . Alcohol abuse Son   .  Diabetes Maternal Uncle   . Diabetes Maternal Grandmother   . Mitral valve prolapse Sister   . CAD Son [id number]       No details available  . Alcohol abuse Father   . Alcohol abuse Brother     Allergies  Allergen Reactions  . Ivp Dye [Iodinated Diagnostic Agents] Hives  . Levofloxacin Hives  . Sulfa Drugs Cross Reactors Hives  . Dilantin [Phenytoin Sodium Extended] Hives and Itching  . Aspirin Other (See Comments)    Upset stomach, resolves with GERD meds   . Caffeine Other (See Comments)    Heart races  . Cymbalta [Duloxetine Hcl] Diarrhea and Nausea Only    Dizzy  . Penicillins Rash    Pt called in stating she  Was prescribed Pen VK in ED and developed a generalized raised rash.  She has taken amoxicillin and augmentin since this time and not had any problem  . Pregabalin Rash  . Sodium Nitrate Other (See Comments)    headaches    Outpatient Medications  Prior to Visit  Medication Sig Dispense Refill  . albuterol (PROVENTIL HFA;VENTOLIN HFA) 108 (90 Base) MCG/ACT inhaler Inhale 2 puffs into the lungs every 6 (six) hours as needed for wheezing or shortness of breath. 1 Inhaler 1  . albuterol (PROVENTIL) (2.5 MG/3ML) 0.083% nebulizer solution Take 3 mLs (2.5 mg total) by nebulization every 6 (six) hours as needed for wheezing or shortness of breath. 150 mL 1  . metoprolol succinate (TOPROL-XL) 50 MG 24 hr tablet TAKE 1 TABLET BY MOUTH DAILY IMMEDIATELY FOLLOWING A MEAL. 60 tablet 11  . rOPINIRole (REQUIP XL) 2 MG 24 hr tablet Take 1 tablet (2 mg total) by mouth at bedtime. 30 tablet 6  . amitriptyline (ELAVIL) 50 MG tablet Take 1 tablet (50 mg total) by mouth at bedtime. (Patient not taking: Reported on 07/14/2018) 30 tablet 1  . celecoxib (CELEBREX) 100 MG capsule Take 1 capsule (100 mg total) by mouth 2 (two) times daily. (Patient not taking: Reported on 07/14/2018) 60 capsule 1  . cyclobenzaprine (FLEXERIL) 10 MG tablet Take 1 tablet (10 mg total) by mouth 3 (three) times daily as needed for muscle spasms. (Patient not taking: Reported on 07/14/2018) 90 tablet 0  . diclofenac (VOLTAREN) 50 MG EC tablet Take 1 tablet (50 mg total) by mouth 3 (three) times daily. (Patient not taking: Reported on 07/14/2018) 90 tablet 1  . fluticasone (FLONASE) 50 MCG/ACT nasal spray Place 2 sprays into both nostrils daily. (Patient not taking: Reported on 07/14/2018) 16 g 1  . Fluticasone-Salmeterol (ADVAIR) 250-50 MCG/DOSE AEPB Inhale 1 puff into the lungs 2 (two) times daily. (Patient not taking: Reported on 07/14/2018) 60 each 6  . furosemide (LASIX) 20 MG tablet Take 1 tablet (20 mg total) by mouth daily. (Patient not taking: Reported on 07/31/2018) 30 tablet 0  . gabapentin (NEURONTIN) 600 MG tablet Take 1 tablet (600 mg total) by mouth 3 (three) times daily. (Patient not taking: Reported on 07/14/2018) 90 tablet 0  . lidocaine (LIDODERM) 5 % Place 1 patch onto the skin daily.  Remove & Discard patch within 12 hours or as directed by MD (Patient not taking: Reported on 07/14/2018) 30 patch 3  . lidocaine (XYLOCAINE) 5 % ointment Apply 1 application topically as needed. (Patient not taking: Reported on 07/14/2018) 35.44 g 0  . methylPREDNISolone (MEDROL DOSEPAK) 4 MG TBPK tablet As instruced (Patient not taking: Reported on 07/14/2018) 21 tablet 0  . metoprolol tartrate (LOPRESSOR) 25 MG  tablet Take 0.5 tablets (12.5 mg total) by mouth as needed. For palpitations (Patient not taking: Reported on 07/31/2018) 30 tablet 3  . potassium chloride SA (K-DUR) 20 MEQ tablet Take 1 tablet (20 mEq total) by mouth daily. (Patient not taking: Reported on 07/31/2018) 30 tablet 3  . pseudoephedrine-acetaminophen (TYLENOL SINUS) 30-500 MG TABS tablet Take 1 tablet by mouth every 4 (four) hours as needed.    . Vitamin D, Ergocalciferol, (DRISDOL) 50000 units CAPS capsule Take 1 capsule (50,000 Units total) by mouth every 7 (seven) days. (Patient not taking: Reported on 07/14/2018) 16 capsule 0   No facility-administered medications prior to visit.      ROS Review of Systems  Constitutional: Negative for activity change, appetite change and fatigue.  HENT: Positive for postnasal drip and sinus pressure. Negative for congestion and sore throat.   Eyes: Negative for visual disturbance.  Respiratory: Negative for cough, chest tightness, shortness of breath and wheezing.   Cardiovascular: Negative for chest pain and palpitations.  Gastrointestinal: Negative for abdominal distention, abdominal pain and constipation.  Endocrine: Negative for polydipsia.  Genitourinary: Negative for dysuria and frequency.  Musculoskeletal:       See hpi  Skin: Negative for rash.  Neurological: Negative for tremors, light-headedness and numbness.  Hematological: Does not bruise/bleed easily.  Psychiatric/Behavioral: Negative for agitation and behavioral problems.    Objective:  BP 106/71   Pulse (!) 102   Temp  98.4 F (36.9 C) (Oral)   Ht  (1.6 m)   Wt 207 lb 9.6 oz (94.2 kg)   SpO2 96%   BMI 36.77 kg/m   BP/Weight 07/31/2018 07/14/2018 07/13/2018  Systolic BP 106 109 -  Diastolic BP 71 70 -  Wt. (Lbs) 207.6 211 185  BMI 36.77 37.38 32.77  Some encounter information is confidential and restricted. Go to Review Flowsheets activity to see all data.      Physical Exam Constitutional:      Appearance: She is well-developed.  HENT:     Head:     Comments: Slight frontal and bilateral maxillary sinus tenderness    Mouth/Throat:     Mouth: Mucous membranes are moist.     Pharynx: No posterior oropharyngeal erythema.  Neck:     Musculoskeletal: Muscular tenderness (tenderness on palpation of posterior spine b/l trapezius muscles) present.  Cardiovascular:     Rate and Rhythm: Normal rate.     Heart sounds: Normal heart sounds. No murmur.  Pulmonary:     Effort: Pulmonary effort is normal.     Breath sounds: Normal breath sounds. No wheezing or rales.  Chest:     Chest wall: No tenderness.  Abdominal:     General: Bowel sounds are normal. There is no distension.     Palpations: Abdomen is soft. There is no mass.     Tenderness: There is no abdominal tenderness.  Musculoskeletal:     Comments: Right wrist in brace Tenderness on palpation of left knee and on flexion and extension  Neurological:     Mental Status: She is alert and oriented to person, place, and time.     CMP Latest Ref Rng & Units 06/25/2018 11/10/2017 05/19/2017  Glucose 70 - 99 mg/dL 161(W) 88 92  BUN 6 - 20 mg/dL Creatinine 0.44 - 1.00 mg/dL 9.60(A) 5.40 9.81  Sodium 135 - 145 mmol/L 137 140 142  Potassium 3.5 - 5.1 mmol/L 3.3(L) 3.8 4.0  Chloride 98 - 111 mmol/L 100 108 106  CO2 22 - 32 mmol/L 23 21(L) 20  Calcium 8.9 - 10.3 mg/dL 9.5 9.3 9.1  Total Protein 6.0 - 8.5 g/dL - - 6.4  Total Bilirubin 0.0 - 1.2 mg/dL - - 0.3  Alkaline Phos 39 - 117 IU/L - - 75  AST 0 - 40 IU/L - - 19  ALT 0 - 32 IU/L  - - 15    Lipid Panel     Component Value Date/Time   CHOL 214 (H) 05/19/2017 1021   TRIG 98 05/19/2017 1021   HDL 62 05/19/2017 1021   CHOLHDL 3.5 05/19/2017 1021   CHOLHDL 3.4 12/01/2013 1615   VLDL 23 12/01/2013 1615   LDLCALC 132 (H) 05/19/2017 1021    CBC    Component Value Date/Time   WBC 8.7 06/25/2018 2305   RBC 4.46 06/25/2018 2305   HGB 14.1 06/25/2018 2305   HGB 13.3 02/20/2018 1552   HCT 42.0 06/25/2018 2305   HCT 38.9 02/20/2018 1552   PLT 172 06/25/2018 2305   PLT 165 02/20/2018 1552   MCV 94.2 06/25/2018 2305   MCV 94 02/20/2018 1552   MCH 31.6 06/25/2018 2305   MCHC 33.6 06/25/2018 2305   RDW 12.4 06/25/2018 2305   RDW 12.3 02/20/2018 1552   LYMPHSABS 2.1 06/25/2018 2305   LYMPHSABS 1.6 02/20/2018 1552   MONOABS 0.7 06/25/2018 2305   EOSABS 0.1 06/25/2018 2305   EOSABS 0.1 02/20/2018 1552   BASOSABS 0.0 06/25/2018 2305   BASOSABS 0.0 02/20/2018 1552    Lab Results  Component Value Date   HGBA1C 5.1 12/01/2013    Assessment & Plan:   1. Restless leg syndrome Stable - rOPINIRole (REQUIP XL) 2 MG 24 hr tablet; Take 1 tablet (2 mg total) by mouth at bedtime.  Dispense: 30 tablet; Refill: 6  2. Other chronic sinusitis Uncontrolled Commenced Zyrtec advised to continue Flonase Counseled on the use of nasal saline irrigation She received allergy shots in the past and if symptoms remain persistent I will refer her to an allergist - cetirizine (ZYRTEC) 10 MG tablet; Take 1 tablet (10 mg total) by mouth daily.  Dispense: 30 tablet; Refill: 1  3. Chronic pain syndrome Uncontrolled She is currently not on any pain medications as her medications have been discontinued by her rehab doctor when she developed pedal edema Advised to resume gabapentin She will be following up with Dr. Allena KatzPatel of rehab for her chronic pain Also scheduled to see a psychologist  4. Carpal tunnel syndrome of right wrist Uncontrolled She will probably need release surgery  Has upcoming appointment with her orthopedics.  5.  Obesity Weight loss will be beneficial especially given her left knee symptoms which could be secondary to osteoarthritis Discussed exercising, reducing portion sizes but unfortunately her ongoing pain is a limitation and she will need to work on reducing her caloric intake  Meds ordered this encounter  Medications  . rOPINIRole (REQUIP XL) 2 MG 24 hr tablet    Sig: Take 1 tablet (2 mg total) by mouth at bedtime.    Dispense:  30 tablet    Refill:  6  . cetirizine (ZYRTEC) 10 MG tablet    Sig: Take 1 tablet (10 mg total) by mouth daily.    Dispense:  30 tablet    Refill:  1    Follow-up: Return in about 1 month (around 08/30/2018) for complete physical exam.       Hoy RegisterEnobong Milarose Savich, MD, FAAFP. [facility] [place],  [place] [phone]   07/31/2018, 2:59 PM

## 2018-07-31 NOTE — Patient Instructions (Signed)

## 2018-08-01 DIAGNOSIS — E669 Obesity, unspecified: Secondary | ICD-10-CM | POA: Insufficient documentation

## 2018-08-02 ENCOUNTER — Ambulatory Visit (INDEPENDENT_AMBULATORY_CARE_PROVIDER_SITE_OTHER): Payer: Medicaid Other | Admitting: Orthopedic Surgery

## 2018-08-02 ENCOUNTER — Encounter: Payer: Self-pay | Admitting: Orthopedic Surgery

## 2018-08-02 ENCOUNTER — Other Ambulatory Visit: Payer: Self-pay

## 2018-08-02 ENCOUNTER — Ambulatory Visit: Payer: Self-pay

## 2018-08-02 VITALS — Ht 63.0 in | Wt 207.0 lb

## 2018-08-02 DIAGNOSIS — G8929 Other chronic pain: Secondary | ICD-10-CM | POA: Diagnosis not present

## 2018-08-02 DIAGNOSIS — M25561 Pain in right knee: Secondary | ICD-10-CM | POA: Diagnosis not present

## 2018-08-02 DIAGNOSIS — S83206D Unspecified tear of unspecified meniscus, current injury, right knee, subsequent encounter: Secondary | ICD-10-CM

## 2018-08-02 DIAGNOSIS — G5602 Carpal tunnel syndrome, left upper limb: Secondary | ICD-10-CM

## 2018-08-03 ENCOUNTER — Encounter: Payer: Self-pay | Admitting: Orthopedic Surgery

## 2018-08-03 DIAGNOSIS — S83206D Unspecified tear of unspecified meniscus, current injury, right knee, subsequent encounter: Secondary | ICD-10-CM

## 2018-08-03 MED ORDER — LIDOCAINE HCL 1 % IJ SOLN
5.0000 mL | INTRAMUSCULAR | Status: AC | PRN
  Administered 2018-08-03: 5 mL

## 2018-08-03 MED ORDER — METHYLPREDNISOLONE ACETATE 40 MG/ML IJ SUSP
40.0000 mg | INTRAMUSCULAR | Status: AC | PRN
  Administered 2018-08-03: 40 mg via INTRA_ARTICULAR

## 2018-08-03 MED ORDER — BUPIVACAINE HCL 0.25 % IJ SOLN
4.0000 mL | INTRAMUSCULAR | Status: AC | PRN
  Administered 2018-08-03: 4 mL via INTRA_ARTICULAR

## 2018-08-03 NOTE — Progress Notes (Signed)
Office Visit Note   Patient: Sylvia Hall           Date of Birth: 12/27/1960           MRN: [id number] Visit Date: 08/02/2018 Requested by: Grayce SessionsEdwards, Michelle P, NP [address] [place],  [place] [id number] PCP: Hoy RegisterNewlin, Enobong, MD  Subjective: Chief Complaint  Patient presents with  . Right Wrist - Follow-up  . Right Knee - Pain    HPI: Sylvia Hall is a patient with right wrist pain.  He is right-hand dominant.  Has a history of left carpal tunnel release and is done reasonably well with that.  Also had similar but slightly less severe symptoms on the right-hand side by EMG study.  She also reports pain in the base of the right thumb.  Symptoms by history are consistent with CMC arthritis.  Patient also describes 6818-month history right knee pain where the knee is "popping out".  She is using an open patella brace.  She reports clicking and popping as well as some giving way.  She states that the hand hurts more than the knee.              ROS: All systems reviewed are negative as they relate to the chief complaint within the history of present illness.  Patient denies  fevers or chills.   Assessment & Plan: Visit Diagnoses:  1. Chronic pain of right knee   2. Carpal tunnel syndrome, left upper limb   3. Acute meniscal tear of right knee, subsequent encounter     Plan: Impression is right knee pain which may be degenerative meniscal tear.  She has mild joint space narrowing on plain radiographs.  No significant clear-cut arthritis with spurring however.  I think we should try an injection in that knee and then consider MRI scanning if the knee remains symptomatic after that.  Regarding the right wrist she has had a left carpal tunnel release.  She is made a functional recovery from that surgery.  Plan for that is right carpal tunnel release.  Risks and benefits are discussed including but limited to infection nerve vessel damage incomplete pain relief and potential loss of strength.  I  think at the same time I would also like to do fluoroscopic guided injection into that [facility].  Follow-Up Instructions: No follow-ups on file.   Orders:  Orders Placed This Encounter  Procedures  . XR KNEE 3 VIEW RIGHT   No orders of the defined types were placed in this encounter.     Procedures: Large Joint Inj: R knee on 08/03/2018 10:31 PM Indications: diagnostic evaluation, joint swelling and pain Details: 18 G 1.5 in needle, superolateral approach  Arthrogram: No  Medications: 5 mL lidocaine 1 %; 40 mg methylPREDNISolone acetate 40 MG/ML; 4 mL bupivacaine 0.25 % Outcome: tolerated well, no immediate complications Procedure, treatment alternatives, risks and benefits explained, specific risks discussed. Consent was given by the patient. Immediately prior to procedure a time out was called to verify the correct patient, procedure, equipment, support staff and site/side marked as required. Patient was prepped and draped in the usual sterile fashion.       Clinical Data: No additional findings.  Objective: Vital Signs: Ht 5\' 3"  (1.6 m)   Wt 207 lb (93.9 kg)   BMI 36.67 kg/m   Physical Exam:   Constitutional: Patient appears well-developed HEENT:  Head: Normocephalic Eyes:EOM are normal Neck: Normal range of motion Cardiovascular: Normal rate Pulmonary/chest: Effort normal Neurologic:  Patient is alert Skin: Skin is warm Psychiatric: Patient has normal mood and affect    Ortho Exam: Ortho exam demonstrates full active and passive range of motion of the right wrist.  There is some abductor pollicis brevis wasting.  EPL FPL interosseous function is intact but there is positive grind testing on the right.  Negative Finkelstein testing.  Radial pulses intact.  Examination of the right knee demonstrates mild medial joint line tenderness but stable collateral crucial ligaments.  Extensor mechanism is intact.  No other masses lymphadenopathy or skin changes noted in  the right knee region.  Range of motion is full.  Trace effusion present.  Specialty Comments:  No specialty comments available.  Imaging: No results found.   PMFS History: Patient Active Problem List   Diagnosis Date Noted  . Obesity (BMI 35.0-39.9 without comorbidity) 08/01/2018  . Myalgia 07/13/2018  . Chronic pain syndrome 10/07/2017  . Degenerative disc disease, lumbar 08/17/2017  . Hyperlipidemia 08/17/2017  . Left sided numbness 12/01/2013  . Acute sinusitis, unspecified 12/08/2012  . Muscle spasm of right leg 12/08/2012  . Acute upper respiratory infections of unspecified site 12/08/2012  . Severe episode of recurrent major depressive disorder ([place]) 12/07/2012  . Borderline personality disorder ([place]) 10/19/2012  . Acute posttraumatic stress disorder 08/24/2012  . Palpitations 08/16/2012  . Neuropathy 08/16/2012  . Restless leg syndrome 08/16/2012  . Routine gynecological examination 07/31/2012  . Neurogenic pain 04/12/2012  . Anxiety 11/03/2011  . Vitamin D deficiency 08/08/2011  . Chronic back pain 05/29/2011  . MVP (mitral valve prolapse) 05/29/2011  . Asthma 05/29/2011  . Tobacco user 05/29/2011  . Chronic mastitis 05/29/2011  . Allergic rhinitis 05/29/2011   Past Medical History:  Diagnosis Date  . Anxiety   . Arthritis   . Asthma   . Back pain, chronic   . Bilateral carpal tunnel syndrome   . Depression   . DVT (deep vein thrombosis) in pregnancy   . Environmental allergies   . Epilepsy ([place])   . Fatigue   . GERD (gastroesophageal reflux disease)   . Headache    sinus  . History of kidney stones   . Mitral valve prolapse   . Palpitations   . Pericarditis   . Pneumonia   . PTSD (post-traumatic stress disorder)   . Stenosis of lumbosacral spine    numbness L hand and L leg  . Thrombophlebitis    Age 31  . Wears glasses     Family History  Problem Relation Age of Onset  . Diabetes Mother   . Mitral valve prolapse Mother   . Asthma Mother    . CAD Mother 66  . Anxiety disorder Mother   . Mitral valve prolapse Sister   . Asthma Sister   . Mitral valve prolapse Son   . Alcohol abuse Son   . Diabetes Maternal Uncle   . Diabetes Maternal Grandmother   . Mitral valve prolapse Sister   . CAD Son 71       No details available  . Alcohol abuse Father   . Alcohol abuse Brother     Past Surgical History:  Procedure Laterality Date  . ABDOMINAL HYSTERECTOMY     partial  . APPENDECTOMY    . BIOPSY BREAST     Right breast x 10  . BREAST LUMPECTOMY     Right breast  . CARPAL TUNNEL RELEASE Left 11/10/2017   Procedure: LEFT CARPAL TUNNEL RELEASE;  Surgeon: Meredith Pel, MD;  Location: [facility];  Service: Orthopedics;  Laterality: Left;  . CESAREAN SECTION     x 2  . NOSE SURGERY     s/p trauma  . OVARY SURGERY    . RHINOPLASTY    . TUBAL LIGATION     left tubal  . UTERINE FIBROID SURGERY     Social History   Occupational History  . Not on file  Tobacco Use  . Smoking status: Current Every Day Smoker    Packs/day: 0.50    Years: 12.00    Pack years: 6.00    Types: Cigarettes  . Smokeless tobacco: Never Used  Substance and Sexual Activity  . Alcohol use: No    Alcohol/week: 0.0 standard drinks  . Drug use: No  . Sexual activity: Not Currently    Birth control/protection: None

## 2018-08-09 MED FILL — rOPINIRole HCL ER 2 MG TB24: 2 | 30 days supply | Qty: 30 | Fill #0

## 2018-08-10 ENCOUNTER — Encounter: Payer: Self-pay | Admitting: Physical Medicine & Rehabilitation

## 2018-08-10 ENCOUNTER — Other Ambulatory Visit: Payer: Self-pay

## 2018-08-10 ENCOUNTER — Encounter: Payer: Medicaid Other | Attending: Physical Medicine & Rehabilitation | Admitting: Physical Medicine & Rehabilitation

## 2018-08-10 VITALS — BP 113/79 | HR 81 | Temp 97.7°F | Ht 63.0 in | Wt 209.0 lb

## 2018-08-10 DIAGNOSIS — Z8249 Family history of ischemic heart disease and other diseases of the circulatory system: Secondary | ICD-10-CM | POA: Diagnosis not present

## 2018-08-10 DIAGNOSIS — M542 Cervicalgia: Secondary | ICD-10-CM | POA: Insufficient documentation

## 2018-08-10 DIAGNOSIS — Z720 Tobacco use: Secondary | ICD-10-CM | POA: Diagnosis not present

## 2018-08-10 DIAGNOSIS — F329 Major depressive disorder, single episode, unspecified: Secondary | ICD-10-CM | POA: Insufficient documentation

## 2018-08-10 DIAGNOSIS — M47812 Spondylosis without myelopathy or radiculopathy, cervical region: Secondary | ICD-10-CM

## 2018-08-10 DIAGNOSIS — M792 Neuralgia and neuritis, unspecified: Secondary | ICD-10-CM

## 2018-08-10 DIAGNOSIS — Z6832 Body mass index (BMI) 32.0-32.9, adult: Secondary | ICD-10-CM | POA: Insufficient documentation

## 2018-08-10 DIAGNOSIS — F4310 Post-traumatic stress disorder, unspecified: Secondary | ICD-10-CM | POA: Diagnosis not present

## 2018-08-10 DIAGNOSIS — G894 Chronic pain syndrome: Secondary | ICD-10-CM | POA: Diagnosis not present

## 2018-08-10 DIAGNOSIS — G479 Sleep disorder, unspecified: Secondary | ICD-10-CM | POA: Diagnosis not present

## 2018-08-10 DIAGNOSIS — M5136 Other intervertebral disc degeneration, lumbar region: Secondary | ICD-10-CM

## 2018-08-10 DIAGNOSIS — Z8672 Personal history of thrombophlebitis: Secondary | ICD-10-CM | POA: Insufficient documentation

## 2018-08-10 DIAGNOSIS — E669 Obesity, unspecified: Secondary | ICD-10-CM | POA: Diagnosis not present

## 2018-08-10 DIAGNOSIS — M545 Low back pain: Secondary | ICD-10-CM | POA: Insufficient documentation

## 2018-08-10 DIAGNOSIS — Z79899 Other long term (current) drug therapy: Secondary | ICD-10-CM | POA: Insufficient documentation

## 2018-08-10 DIAGNOSIS — Z833 Family history of diabetes mellitus: Secondary | ICD-10-CM | POA: Diagnosis not present

## 2018-08-10 DIAGNOSIS — Z825 Family history of asthma and other chronic lower respiratory diseases: Secondary | ICD-10-CM | POA: Diagnosis not present

## 2018-08-10 DIAGNOSIS — G40909 Epilepsy, unspecified, not intractable, without status epilepticus: Secondary | ICD-10-CM | POA: Insufficient documentation

## 2018-08-10 DIAGNOSIS — F17210 Nicotine dependence, cigarettes, uncomplicated: Secondary | ICD-10-CM | POA: Insufficient documentation

## 2018-08-10 DIAGNOSIS — I341 Nonrheumatic mitral (valve) prolapse: Secondary | ICD-10-CM | POA: Diagnosis not present

## 2018-08-10 DIAGNOSIS — Z818 Family history of other mental and behavioral disorders: Secondary | ICD-10-CM | POA: Insufficient documentation

## 2018-08-10 DIAGNOSIS — J45909 Unspecified asthma, uncomplicated: Secondary | ICD-10-CM | POA: Diagnosis not present

## 2018-08-10 DIAGNOSIS — E559 Vitamin D deficiency, unspecified: Secondary | ICD-10-CM | POA: Insufficient documentation

## 2018-08-10 DIAGNOSIS — F419 Anxiety disorder, unspecified: Secondary | ICD-10-CM | POA: Diagnosis not present

## 2018-08-10 DIAGNOSIS — M791 Myalgia, unspecified site: Secondary | ICD-10-CM | POA: Insufficient documentation

## 2018-08-10 DIAGNOSIS — M48061 Spinal stenosis, lumbar region without neurogenic claudication: Secondary | ICD-10-CM | POA: Diagnosis not present

## 2018-08-10 DIAGNOSIS — G5603 Carpal tunnel syndrome, bilateral upper limbs: Secondary | ICD-10-CM

## 2018-08-10 DIAGNOSIS — G5711 Meralgia paresthetica, right lower limb: Secondary | ICD-10-CM

## 2018-08-10 MED ORDER — CARISOPRODOL 350 MG PO TABS: 350.0000 mg | ORAL_TABLET | Freq: Two times a day (BID) | ORAL | 0 refills | Status: AC

## 2018-08-10 MED FILL — METOPROLOL SUCCINATE ER 50: 50 | 30 days supply | Qty: 30 | Fill #4

## 2018-08-10 NOTE — Progress Notes (Signed)
Subjective:    Patient ID: Sylvia IslamBarbara J Duet, female    DOB: 06/21/1960, 58 y.o.   MRN: [id number]  HPI  Female with pmh PTSD, lumbar stenosis, depression/anxiety present with generalized pain.   Initially stated: >>Left neck pain. Started 2012.  Denies inciting event.  Denies alleviating factors.  Moving exacerbates the pain.  All qualities of pain.  Radiating from hand proximally.  Constant.  Gabapentin, Robaxin, Ibu, Lidocaine patch without benefit.  Associated numbness and weakness.  Pain limits any activity. Denies falls.  Pt recently saw Ortho, notes reviewed, NCS/EMG showing b/l carpal tunnel and lumbar facet arthropathy.   Last clinic visit 07/13/2018. Since that time, she notes increased in pain.  She had steroid injection in her right knee.  She has not picked up TENS unit. She has planned right CTS surgery at the end of this month. She states she has not heard back from Psychology. PT is till on hold. No benefit with medrol dose pack. She stopped taking Elavil. She was told she could  Get back injections. She is still smoking 1/2 PPD. She states worse pain is in neck.    Pain Inventory Average Pain 9 Pain Right Now 9 My pain is constant, sharp, burning, stabbing, tingling and aching  In the last 24 hours, has pain interfered with the following? General activity 9 Relation with others 9 Enjoyment of life 10 What TIME of day is your pain at its worst? all Sleep (in general) Poor  Pain is worse with: walking, bending, sitting, inactivity and standing Pain improves with: none Relief from Meds: 0  Mobility walk without assistance ability to climb steps?  no do you drive?  no  Function not employed: date last employed n/a disabled: date disabled application in process I need assistance with the following:  dressing, household duties and shopping  Neuro/Psych weakness numbness tingling spasms  Prior Studies Any changes since last visit?  yes x-rays  Physicians involved  in your care Any changes since last visit?  yes Orthopedist Dr. August Saucerean   Family History  Problem Relation Age of Onset  . Diabetes Mother   . Mitral valve prolapse Mother   . Asthma Mother   . CAD Mother [id number]  . Anxiety disorder Mother   . Mitral valve prolapse Sister   . Asthma Sister   . Mitral valve prolapse Son   . Alcohol abuse Son   . Diabetes Maternal Uncle   . Diabetes Maternal Grandmother   . Mitral valve prolapse Sister   . CAD Son [id number]       No details available  . Alcohol abuse Father   . Alcohol abuse Brother    Social History   Socioeconomic History  . Marital status: Single    Spouse name: Not on file  . Number of children: 2  . Years of education: Not on file  . Highest education level: Not on file  Occupational History  . Not on file  Social Needs  . Financial resource strain: Not on file  . Food insecurity    Worry: Not on file    Inability: Not on file  . Transportation needs    Medical: Not on file    Non-medical: Not on file  Tobacco Use  . Smoking status: Current Every Day Smoker    Packs/day: 0.50    Years: 12.00    Pack years: 6.00    Types: Cigarettes  . Smokeless tobacco: Never Used  Substance and Sexual Activity  .  Alcohol use: No    Alcohol/week: 0.0 standard drinks  . Drug use: No  . Sexual activity: Not Currently    Birth control/protection: None  Lifestyle  . Physical activity    Days per week: Not on file    Minutes per session: Not on file  . Stress: Not on file  Relationships  . Social Herbalist on phone: Not on file    Gets together: Not on file    Attends religious service: Not on file    Active member of club or organization: Not on file    Attends meetings of clubs or organizations: Not on file    Relationship status: Not on file  Other Topics Concern  . Not on file  Social History Narrative   Lives alone.     Past Surgical History:  Procedure Laterality Date  . ABDOMINAL HYSTERECTOMY     partial   . APPENDECTOMY    . BIOPSY BREAST     Right breast x 10  . BREAST LUMPECTOMY     Right breast  . CARPAL TUNNEL RELEASE Left 11/10/2017   Procedure: LEFT CARPAL TUNNEL RELEASE;  Surgeon: Meredith Pel, MD;  Location: [facility];  Service: Orthopedics;  Laterality: Left;  . CESAREAN SECTION     x 2  . NOSE SURGERY     s/p trauma  . OVARY SURGERY    . RHINOPLASTY    . TUBAL LIGATION     left tubal  . UTERINE FIBROID SURGERY     Past Medical History:  Diagnosis Date  . Anxiety   . Arthritis   . Asthma   . Back pain, chronic   . Bilateral carpal tunnel syndrome   . Depression   . DVT (deep vein thrombosis) in pregnancy   . Environmental allergies   . Epilepsy ([place])   . Fatigue   . GERD (gastroesophageal reflux disease)   . Headache    sinus  . History of kidney stones   . Mitral valve prolapse   . Palpitations   . Pericarditis   . Pneumonia   . PTSD (post-traumatic stress disorder)   . Stenosis of lumbosacral spine    numbness L hand and L leg  . Thrombophlebitis    Age 17  . Wears glasses    BP 113/79   Pulse 81   Temp 97.7 F (36.5 C)   Ht 5\' 3"  (1.6 m)   Wt 209 lb (94.8 kg)   SpO2 97%   BMI 37.02 kg/m   Opioid Risk Score:   Fall Risk Score:  `1  Depression screen PHQ 2/9  Depression screen [facility] 2/9 07/31/2018 07/14/2018 05/18/2018 02/20/2018 01/20/2018 01/04/2018 08/17/2017  Decreased Interest 0 0 0 0 0 0 0  Down, Depressed, Hopeless 0 0 0 0 0 0 0  PHQ - 2 Score 0 0 0 0 0 0 0  Altered sleeping 0 3 - 0 - - 3  Tired, decreased energy 0 0 - 0 - - 1  Change in appetite 0 0 - 0 - - 0  Feeling bad or failure about yourself  0 0 - 0 - - 0  Trouble concentrating 0 0 - 0 - - 0  Moving slowly or fidgety/restless 0 0 - 0 - - 0  Suicidal thoughts 0 0 - 0 - - 0  PHQ-9 Score 0 3 - 0 - - 4  Some recent data might be hidden     Review of  Systems  Constitutional: Negative.   HENT: Negative.   Eyes: Negative.   Respiratory: Negative.   Cardiovascular: Negative.    Gastrointestinal: Negative.   Endocrine: Negative.   Genitourinary: Negative.   Musculoskeletal: Positive for arthralgias, back pain, gait problem, myalgias, neck pain and neck stiffness.  Skin: Negative.   Allergic/Immunologic: Positive for environmental allergies.       Seasonal allergies  Neurological: Positive for weakness and numbness.       Tingling  Hematological: Negative.   Psychiatric/Behavioral: Negative.   All other systems reviewed and are negative.     Objective:   Physical Exam Constitutional: No distress . Vital signs reviewed. HENT: Normocephalic.  Atraumatic. Eyes: EOMI. No discharge. Cardiovascular: No JVD. Respiratory: Normal effort. GI: Non-distended. MSK:   Gait antalgic             +TTP diffusely              Numbness right anterior thigh             No edema.  Neuro:              Strength          4-/5 in all nmyotomes (pain inhibition) Skin: Warm and Dry. Intact Psych: Flat    Assessment & Plan:  Female with pmh PTSD, lumbar stenosis, depression/anxiety present with generalized pain.   1. Generalized pain  MRI C-spine from 09/2017 reviewed, showing mild facet arthropathy C3-6, C7-T1  MRI L-spine from 10/2017 reviewed, showing sacral spondylosis  No benefit with Heat/Cold, Robaxin, Mobic, Diclofenac, Flexaril, Celebrex, Medrol dose pack, Elavil  Cont Lidoderm patch/ointment  Unable to tolerate Cymbalta, Lyrica, Gabapentin at higher doses, Baclofen, Gabapentin, Flexaril, Celebrex  [facility] could not fill Skelaxin due to cost  Will consider Sevella, likely cost limiting  CRP/ANA WNL  Encouraged patient to pick up TENS, states she does not have a presciption now, will reorder  Refered to Psychology, recommended follow up again, states she did not receive call back again - made referral again, awaiting appointment, states no one called her, will follow up again  Referred for PT, going to start after CoVid.  Avoid Tylenol >3grams per day   Soma 350 BID started on 08/10/2018.  She states her mother has similar problems and Tresa GarterSoma works for her.  Informed patient this would be one time prescription   2. Sleep disturbance  See #1  No benefit with Elavil  4. Obesity  Will consider referral to dietitian in future  5. Myalgia   Will consider trigger point injections when able to tolerate  6. B/l CTS  Per NCS/EMG, reviewed  S/p left CTS  Plan for right CTS end of this month  Cont brace  Follow up with Ortho  7. Mechanical neck/low back pain, now with pain and numbness in right thigh  Neck more bothersome at present, will refer for cervical MBB   Will refer to injection in future for back  8. Vit D deficiency  Vit D 18 on 11/16/17, will reorder lab   Completed supplement  9. Tobacco abuse  Encouraged abstinence again, still smoking 1/2 PPD  10. Meralgia Paresthetica on right  See #1  Would like to hold off on injection at present  11. Right Sacroiliitis  Encouraged follow up with Ortho regarding injection after CoVid

## 2018-08-14 ENCOUNTER — Encounter (HOSPITAL_BASED_OUTPATIENT_CLINIC_OR_DEPARTMENT_OTHER): Payer: Self-pay

## 2018-08-14 ENCOUNTER — Ambulatory Visit (HOSPITAL_BASED_OUTPATIENT_CLINIC_OR_DEPARTMENT_OTHER): Admit: 2018-08-14 | Payer: Medicaid Other | Admitting: Orthopedic Surgery

## 2018-08-14 SURGERY — CARPAL TUNNEL RELEASE
Anesthesia: Regional | Laterality: Right

## 2018-08-16 ENCOUNTER — Telehealth: Payer: Self-pay | Admitting: Family Medicine

## 2018-08-16 NOTE — Telephone Encounter (Signed)
In person or tele?

## 2018-08-16 NOTE — Telephone Encounter (Signed)
Patient came in stating she was told by her dental provider to get clearance for a tooth extraction states that PCP needs to OK the procedure. Please follow up.

## 2018-08-17 NOTE — Telephone Encounter (Signed)
Patient was called and a voicemail was left informing patient to return phone call to schedule an appointment.

## 2018-08-17 NOTE — Telephone Encounter (Signed)
In person. Please find out if the dental office needs a form completed so the patient can bring this in for her appointment.Thanks.

## 2018-08-21 ENCOUNTER — Telehealth: Payer: Self-pay | Admitting: Family Medicine

## 2018-08-21 NOTE — Telephone Encounter (Signed)
Patient was called and informed to contact heart doctor for surgery clearance.

## 2018-08-21 NOTE — Telephone Encounter (Signed)
Pt called stating that she doesn't want an appt for clearance all she wants is an ok for antibiotics..please follow up

## 2018-08-24 ENCOUNTER — Telehealth: Payer: Self-pay | Admitting: Orthopedic Surgery

## 2018-08-24 NOTE — Telephone Encounter (Signed)
Patient called and left a message on voice mail.  She is cancelling her right carpal tunnel release surgery at [facility] on 08-28-18 because she has an infected tooth and is having dental work done.  She will reschedule to a later date.

## 2018-08-25 NOTE — Telephone Encounter (Signed)
See note from Dr Dean. Thanks.  

## 2018-08-25 NOTE — Telephone Encounter (Signed)
FYI

## 2018-08-25 NOTE — Telephone Encounter (Signed)
Ok thx see if debbie can put something else in

## 2018-08-28 ENCOUNTER — Telehealth: Payer: Self-pay | Admitting: *Deleted

## 2018-08-28 NOTE — Telephone Encounter (Signed)
Pt called and states she would like to cancel her appt 08/29/2018 due to a dental surgery she is having. Pt states she will call back once she is ready to schedule appt.

## 2018-08-29 ENCOUNTER — Ambulatory Visit: Payer: Medicaid Other | Admitting: Physical Medicine and Rehabilitation

## 2018-08-30 ENCOUNTER — Ambulatory Visit: Payer: Medicaid Other | Admitting: Family Medicine

## 2018-09-06 ENCOUNTER — Inpatient Hospital Stay: Payer: Medicaid Other | Admitting: Orthopedic Surgery

## 2018-09-18 ENCOUNTER — Ambulatory Visit: Payer: Medicaid Other | Admitting: Family Medicine

## 2018-09-25 ENCOUNTER — Encounter: Payer: Medicaid Other | Attending: Physical Medicine & Rehabilitation | Admitting: Psychology

## 2018-09-25 ENCOUNTER — Other Ambulatory Visit: Payer: Self-pay

## 2018-09-25 ENCOUNTER — Telehealth: Payer: Self-pay | Admitting: Physical Medicine & Rehabilitation

## 2018-09-25 DIAGNOSIS — F4312 Post-traumatic stress disorder, chronic: Secondary | ICD-10-CM | POA: Diagnosis not present

## 2018-09-25 DIAGNOSIS — Z818 Family history of other mental and behavioral disorders: Secondary | ICD-10-CM | POA: Diagnosis not present

## 2018-09-25 DIAGNOSIS — Z833 Family history of diabetes mellitus: Secondary | ICD-10-CM | POA: Insufficient documentation

## 2018-09-25 DIAGNOSIS — M48061 Spinal stenosis, lumbar region without neurogenic claudication: Secondary | ICD-10-CM | POA: Insufficient documentation

## 2018-09-25 DIAGNOSIS — G894 Chronic pain syndrome: Secondary | ICD-10-CM

## 2018-09-25 DIAGNOSIS — J45909 Unspecified asthma, uncomplicated: Secondary | ICD-10-CM | POA: Insufficient documentation

## 2018-09-25 DIAGNOSIS — F329 Major depressive disorder, single episode, unspecified: Secondary | ICD-10-CM | POA: Diagnosis not present

## 2018-09-25 DIAGNOSIS — M545 Low back pain: Secondary | ICD-10-CM | POA: Diagnosis not present

## 2018-09-25 DIAGNOSIS — I341 Nonrheumatic mitral (valve) prolapse: Secondary | ICD-10-CM | POA: Insufficient documentation

## 2018-09-25 DIAGNOSIS — F17210 Nicotine dependence, cigarettes, uncomplicated: Secondary | ICD-10-CM | POA: Insufficient documentation

## 2018-09-25 DIAGNOSIS — E669 Obesity, unspecified: Secondary | ICD-10-CM | POA: Insufficient documentation

## 2018-09-25 DIAGNOSIS — Z8672 Personal history of thrombophlebitis: Secondary | ICD-10-CM | POA: Diagnosis not present

## 2018-09-25 DIAGNOSIS — F419 Anxiety disorder, unspecified: Secondary | ICD-10-CM | POA: Diagnosis not present

## 2018-09-25 DIAGNOSIS — M542 Cervicalgia: Secondary | ICD-10-CM | POA: Diagnosis present

## 2018-09-25 DIAGNOSIS — M791 Myalgia, unspecified site: Secondary | ICD-10-CM | POA: Diagnosis not present

## 2018-09-25 DIAGNOSIS — Z8249 Family history of ischemic heart disease and other diseases of the circulatory system: Secondary | ICD-10-CM | POA: Insufficient documentation

## 2018-09-25 DIAGNOSIS — G479 Sleep disorder, unspecified: Secondary | ICD-10-CM | POA: Diagnosis not present

## 2018-09-25 DIAGNOSIS — F4310 Post-traumatic stress disorder, unspecified: Secondary | ICD-10-CM | POA: Insufficient documentation

## 2018-09-25 DIAGNOSIS — Z79899 Other long term (current) drug therapy: Secondary | ICD-10-CM | POA: Insufficient documentation

## 2018-09-25 DIAGNOSIS — E559 Vitamin D deficiency, unspecified: Secondary | ICD-10-CM | POA: Diagnosis not present

## 2018-09-25 DIAGNOSIS — Z825 Family history of asthma and other chronic lower respiratory diseases: Secondary | ICD-10-CM | POA: Insufficient documentation

## 2018-09-25 DIAGNOSIS — Z6832 Body mass index (BMI) 32.0-32.9, adult: Secondary | ICD-10-CM | POA: Insufficient documentation

## 2018-09-25 DIAGNOSIS — G40909 Epilepsy, unspecified, not intractable, without status epilepticus: Secondary | ICD-10-CM | POA: Diagnosis not present

## 2018-09-25 NOTE — Telephone Encounter (Signed)
If this is for Manuela Neptune, it was a one time prescription.

## 2018-09-25 NOTE — Telephone Encounter (Signed)
Patient is needing a refill on her muscle relaxer.  Please make sure that is sent to her new pharmacy [facility] [address].

## 2018-09-26 NOTE — Telephone Encounter (Signed)
Patient stated that she is not requested Soma but she would like Flexeril or Robaxin. She is having severe muscles spasms and has taken those in the past per patient so she would like to take one of them again for this issue.

## 2018-09-27 ENCOUNTER — Telehealth: Payer: Self-pay | Admitting: Family Medicine

## 2018-09-27 ENCOUNTER — Telehealth: Payer: Self-pay | Admitting: *Deleted

## 2018-09-27 NOTE — Telephone Encounter (Signed)
Pt was prescribed methocarbamol by Dr[facility]. Last rx was written in 09/2017. Will defer to PCP to write if appropriate. Pt may need to obtain from Dr. Posey Pronto as he manages patient's chronic pain.

## 2018-09-27 NOTE — Telephone Encounter (Signed)
1) Medication(s) Requested (by name): nethocarbamol  2) Pharmacy [facility]: [id number] [place], [place] - [address]  3) Special Requests:   Approved medications will be sent to the pharmacy, we will reach out if there is an issue.  Requests made after 3pm may not be addressed until the following business day!  If a patient is unsure of the name of the medication(s) please note and ask patient to call back when they are able to provide all info, do not send to responsible party until all information is available!

## 2018-09-27 NOTE — Telephone Encounter (Signed)
Sylvia Hall called back about her medication.  I explained that Dr Posey Pronto is out of the office until Monday and I cannot refill her medication without his order. She is not under a contract for a control substance so I recommended she see her PCP. She is going to try and get in with her primary MD and will get a referral elsewhere because she does not feel like anything is being done.

## 2018-09-28 NOTE — Telephone Encounter (Signed)
Dr Posey Pronto has her on Flexeril from 06/2018. I would suggest she reach out to him regarding this. Thanks

## 2018-09-29 NOTE — Telephone Encounter (Signed)
Patient was called and a voicemail was left informing patient to return phone call. 

## 2018-09-30 NOTE — Telephone Encounter (Signed)
We can prescribed Robaxin 750 TID PRN, although she stated it was ineffective on last visit.

## 2018-10-02 ENCOUNTER — Telehealth: Payer: Self-pay | Admitting: Family Medicine

## 2018-10-02 MED ORDER — METHOCARBAMOL 750 MG PO TABS: 750.0000 mg | ORAL_TABLET | Freq: Three times a day (TID) | ORAL | 0 refills | Status: DC | PRN

## 2018-10-02 MED FILL — METHOCARBAMOL 750 MG TABS: 750 | 30 days supply | Qty: 90 | Fill #0

## 2018-10-02 NOTE — Addendum Note (Signed)
Addended by: Caro Hight on: 10/02/2018 11:19 AM   Modules accepted: Orders

## 2018-10-02 NOTE — Telephone Encounter (Signed)
Order sent to the pharmacy. LVM for Mrs Gordy Levan.

## 2018-10-02 NOTE — Telephone Encounter (Signed)
Pt would like to be called back she states she'll be home for the rest of the day..please follow up

## 2018-10-03 NOTE — Telephone Encounter (Signed)
Patient was called and a voicemail was left informing patient to return phone call for medication concern.

## 2018-10-12 ENCOUNTER — Encounter: Payer: Medicaid Other | Attending: Physical Medicine & Rehabilitation | Admitting: Physical Medicine & Rehabilitation

## 2018-10-12 DIAGNOSIS — I341 Nonrheumatic mitral (valve) prolapse: Secondary | ICD-10-CM | POA: Insufficient documentation

## 2018-10-12 DIAGNOSIS — Z833 Family history of diabetes mellitus: Secondary | ICD-10-CM | POA: Insufficient documentation

## 2018-10-12 DIAGNOSIS — E559 Vitamin D deficiency, unspecified: Secondary | ICD-10-CM | POA: Insufficient documentation

## 2018-10-12 DIAGNOSIS — Z8249 Family history of ischemic heart disease and other diseases of the circulatory system: Secondary | ICD-10-CM | POA: Insufficient documentation

## 2018-10-12 DIAGNOSIS — M542 Cervicalgia: Secondary | ICD-10-CM | POA: Insufficient documentation

## 2018-10-12 DIAGNOSIS — Z818 Family history of other mental and behavioral disorders: Secondary | ICD-10-CM | POA: Insufficient documentation

## 2018-10-12 DIAGNOSIS — F17210 Nicotine dependence, cigarettes, uncomplicated: Secondary | ICD-10-CM | POA: Insufficient documentation

## 2018-10-12 DIAGNOSIS — Z825 Family history of asthma and other chronic lower respiratory diseases: Secondary | ICD-10-CM | POA: Insufficient documentation

## 2018-10-12 DIAGNOSIS — J45909 Unspecified asthma, uncomplicated: Secondary | ICD-10-CM | POA: Insufficient documentation

## 2018-10-12 DIAGNOSIS — M791 Myalgia, unspecified site: Secondary | ICD-10-CM | POA: Insufficient documentation

## 2018-10-12 DIAGNOSIS — M48061 Spinal stenosis, lumbar region without neurogenic claudication: Secondary | ICD-10-CM | POA: Insufficient documentation

## 2018-10-12 DIAGNOSIS — Z79899 Other long term (current) drug therapy: Secondary | ICD-10-CM | POA: Insufficient documentation

## 2018-10-12 DIAGNOSIS — G479 Sleep disorder, unspecified: Secondary | ICD-10-CM | POA: Insufficient documentation

## 2018-10-12 DIAGNOSIS — F329 Major depressive disorder, single episode, unspecified: Secondary | ICD-10-CM | POA: Insufficient documentation

## 2018-10-12 DIAGNOSIS — Z6832 Body mass index (BMI) 32.0-32.9, adult: Secondary | ICD-10-CM | POA: Insufficient documentation

## 2018-10-12 DIAGNOSIS — E669 Obesity, unspecified: Secondary | ICD-10-CM | POA: Insufficient documentation

## 2018-10-12 DIAGNOSIS — Z8672 Personal history of thrombophlebitis: Secondary | ICD-10-CM | POA: Insufficient documentation

## 2018-10-12 DIAGNOSIS — M545 Low back pain: Secondary | ICD-10-CM | POA: Insufficient documentation

## 2018-10-12 DIAGNOSIS — F419 Anxiety disorder, unspecified: Secondary | ICD-10-CM | POA: Insufficient documentation

## 2018-10-12 DIAGNOSIS — G40909 Epilepsy, unspecified, not intractable, without status epilepticus: Secondary | ICD-10-CM | POA: Insufficient documentation

## 2018-10-12 DIAGNOSIS — F4310 Post-traumatic stress disorder, unspecified: Secondary | ICD-10-CM | POA: Insufficient documentation

## 2018-10-17 ENCOUNTER — Encounter: Payer: Self-pay | Admitting: Psychology

## 2018-10-17 NOTE — Progress Notes (Signed)
Neuropsychology Visit  Patient:  Sylvia Hall   DOB: [birth year]  MR Number: [id number]  Location: [facility] [place], [place] [id number] [place] [address] Dept: [phone]  Date of Service: 09/25/2018  Start: 1 PM End: 2 PM  Duration of Service: 1 Hour  Today's visit was an in person visit that was conducted with myself and the patient.  Provider/Observer:     Edgardo Roys PsyD  Chief Complaint:     No chief complaint on file.   Reason For Service:     [name] is a 58 year old female referred by Dr. Posey Pronto for therapeutic interventions due to significant chronic pain symptoms along with a history of posttraumatic stress disorder, lumbar stenosis, depression and anxiety and generalized pain.  The patient's pain symptoms started in 2012 with no specific initiating event.  The patient reports that her pain increases when she moves.  The patient also describes significant issues related to depression anxiety symptoms and prior history of PTSD symptoms and traumatic experiences.  Treatment Interventions:  Cognitive/behavioral therapeutic interventions and working on pain management issues related to her chronic pain.  Participation Level:   Active  Participation Quality:  Appropriate and Attentive      Behavioral Observation:  Well Groomed, Alert, and Appropriate.   Current Psychosocial Factors: The patient reports that she does have a lot of stressors in her life that tend to exacerbate and worsen her adjustment to and coping with her chronic pain features.  Content of Session:   Today we reviewed her history of pain and PTSD symptoms along with anxiety and depressive symptoms and began working on strategies for therapeutic interventions going forward.  Effectiveness of Interventions: The patient was very appropriate during this visit and we were able to  review a lot of history as well as begin working on therapeutic interventions.  Target Goals:   Targeted goals include reducing issues of depression anxiety and work on issues of her chronic PTSD symptoms in conjunction with working on pain management skills and dealing with some of her chronic pain symptoms.  Goals Last Reviewed:   09/25/2018  Goals Addressed Today:    Today we worked on developing an recovery Clinical history as well as working on therapeutic interventions and understanding strategies going forward.  Impression/Diagnosis:   The patient has a history of recurrent chronic pain going back to 2012 that is exacerbated symptoms of depression and anxiety.  She has a prior history of posttraumatic stress disorder as well and has been diagnosed with lumbar stenosis.  The patient also has shown issues of carpal tunnel and lumbar facet arthropathy.  Diagnosis:   Chronic pain syndrome  Anxiety  Chronic post-traumatic stress disorder (PTSD)  Sleep disturbance    [name], Psy.D. Clinical Psychologist Neuropsychologist

## 2018-11-07 ENCOUNTER — Ambulatory Visit (INDEPENDENT_AMBULATORY_CARE_PROVIDER_SITE_OTHER): Payer: Medicaid Other | Admitting: Physical Medicine and Rehabilitation

## 2018-11-07 ENCOUNTER — Encounter: Payer: Self-pay | Admitting: Physical Medicine and Rehabilitation

## 2018-11-07 DIAGNOSIS — M542 Cervicalgia: Secondary | ICD-10-CM

## 2018-11-07 DIAGNOSIS — M47812 Spondylosis without myelopathy or radiculopathy, cervical region: Secondary | ICD-10-CM

## 2018-11-07 NOTE — Progress Notes (Signed)
  Numeric Pain Rating Scale and Functional Assessment Average Pain 5 Pain Right Now 5 My pain is intermittent, sharp, dull and aching Pain is worse with: bending and raising arms, washing hair, lying in bed Pain improves with: nothing   In the last MONTH (on 0-10 scale) has pain interfered with the following?  1. General activity like being  able to carry out your everyday physical activities such as walking, climbing stairs, carrying groceries, or moving a chair?  Rating(8)  2. Relation with others like being able to carry out your usual social activities and roles such as  activities at home, at work and in your community. Rating(8)  3. Enjoyment of life such that you have  been bothered by emotional problems such as feeling anxious, depressed or irritable?  Rating(10)

## 2018-11-08 ENCOUNTER — Encounter: Payer: Self-pay | Admitting: Physical Medicine and Rehabilitation

## 2018-11-08 NOTE — Progress Notes (Signed)
Sylvia COLETTA - 58 y.o. female MRN [id number]  Date of birth: [birth year]  Office Visit Note: Visit Date: 11/07/2018 PCP: Sylvia Register, MD Referred by: Sylvia Register, MD  Subjective: Chief Complaint  Patient presents with   Neck - Pain   HPI: Sylvia Hall is a 58 y.o. female who comes in today At the quest of Dr. Maryla Hall for evaluation management possible cervical interventional spine procedure.  We actually have seen the patient in the past for electrodiagnostic study through Dr. Burnard Hall.  Since I have seen her she has had left carpal tunnel release and doing fairly well with that although some continued numbness.  Patient did have pretty severe nerve compression.  She is followed by Dr. Allena Hall for chronic pain syndrome and mainly neck pain which is essentially been chronic for quite some time.  She reports somewhat new onset of spasming in the left trapezius and upper shoulder region.  She reports pain in the neck mainly left-sided but bilateral worse with rotation to the left.  She has difficulty rotating to the left much more than the right.  She reports her average pain is a 5 out of 10.  This is intermittent sharp but sometimes dull and aching.  Pain is worse with bending and raising her arms.  She gets a lot of pain in the neck with washing her hair and lying in bed.  She really reports no relief with any medications or treatment to date.  She has multiple drug intolerances and those are listed.  Unfortunately she does have allergy to x-ray contrast.  She evidently gets hives from this.  She has had MRI of the cervical spine and this is reviewed with the patient today with spine models and imaging.  She clearly has left more than right facet arthropathy particularly at C3-4 C4-5 and C5-6.  She does not have any focal nerve compression or significant central stenosis.  She denies any real radicular complaints but clearly had issues with focal nerve compression in the wrist.  The  right carpal tunnel has not been operated on.  We actually were going to see her several months ago and she was going to have the right side completed by Dr. August Hall but had to cancel do to some dental issues.  Review of Systems  Constitutional: Negative for chills, fever, malaise/fatigue and weight loss.  HENT: Negative for hearing loss and sinus pain.   Eyes: Negative for blurred vision, double vision and photophobia.  Respiratory: Negative for cough and shortness of breath.   Cardiovascular: Negative for chest pain, palpitations and leg swelling.  Gastrointestinal: Negative for abdominal pain, nausea and vomiting.  Genitourinary: Negative for flank pain.  Musculoskeletal: Positive for neck pain. Negative for myalgias.  Skin: Negative for itching and rash.  Neurological: Negative for tremors, focal weakness and weakness.  Endo/Heme/Allergies: Negative.   Psychiatric/Behavioral: Negative for depression.  All other systems reviewed and are negative.  Otherwise per HPI.  Assessment & Plan: Visit Diagnoses:  1. Cervical spondylosis without myelopathy   2. Cervicalgia     Plan: Findings:  Multifactorial chronic pain syndrome and chronic neck pain but with MRI evidence of pretty significant left more than right facet arthropathy.  We had a long discussion today about her cervical spine neck pain.  Exam is consistent with pain with rotation and extension.  I think she is getting some level of pain from the facet joints.  I am not sure what to make of  the spasming.  I do not see any nerve compression that would cause that and it may be an issue of just poor posture and use of the upper extremities do to pain.  It could be related to myofascial trigger points activated more or less with problems with inflamed arthritic joints just causing more of a movement issue.  She has tried muscle relaxers without much relief.  She is tried heat and ice which we did talk about with stretching.  We will try  left-sided diagnostic medial branch blocks at C3-4, C4-5 and C5-6.  If she does extremely well with that it is diagnostic would look at double block paradigm and possibly radiofrequency ablation.  Also would look at if it did extremely well potentially do in the right side.  Otherwise she will continue to follow with Dr. August Hall and Dr. Allena Hall.    Meds & Orders: No orders of the defined types were placed in this encounter.  No orders of the defined types were placed in this encounter.   Follow-up: Return for Left C3-4, C4-5 and C5-6 facet joint blocks.   Procedures: No procedures performed  No notes on file   Clinical History: No specialty comments available.   She reports that she has been smoking cigarettes. She has a 6.00 pack-year smoking history. She has never used smokeless tobacco.  Recent Labs    07/14/18 1050  LABURIC 5.9    Objective:  VS:  HT:     WT:    BMI:      BP:    HR: bpm   TEMP: ( )   RESP:  Physical Exam Vitals signs and nursing note reviewed.  Constitutional:      General: She is not in acute distress.    Appearance: Normal appearance. She is well-developed. She is not ill-appearing.  HENT:     Head: Normocephalic and atraumatic.  Eyes:     Conjunctiva/sclera: Conjunctivae normal.     Pupils: Pupils are equal, round, and reactive to light.  Neck:     Musculoskeletal: Neck supple. Muscular tenderness present. No neck rigidity.  Cardiovascular:     Rate and Rhythm: Normal rate.     Pulses: Normal pulses.  Pulmonary:     Effort: Pulmonary effort is normal.  Musculoskeletal:     Right lower leg: No edema.     Left lower leg: No edema.     Comments: Cervical spine exam shows the patient sitting with forward flexed cervical spine.  She has pain with extension and decreased range of motion in extension.  She has a negative Spurling's test bilaterally.  She has reduced range of motion to the left much more than the right.  She has pain with left rotation.  She does  have some active trigger points in the trapezius and levator scapula left more than right.  Seems to have some level of shoulder impingement as well.  Lymphadenopathy:     Cervical: No cervical adenopathy.  Skin:    General: Skin is warm and dry.     Findings: No erythema or rash.  Neurological:     General: No focal deficit present.     Mental Status: She is alert and oriented to person, place, and time.     Sensory: No sensory deficit.     Motor: No abnormal muscle tone.     Coordination: Coordination normal.     Gait: Gait normal.  Psychiatric:        Mood and Affect:  Mood normal.        Behavior: Behavior normal.     Ortho Exam Imaging: No results found.  Past Medical/Family/Surgical/Social History: Medications & Allergies reviewed per EMR, new medications updated. Patient Active Problem List   Diagnosis Date Noted   Tobacco abuse 08/10/2018   Obesity (BMI 35.0-39.9 without comorbidity) 08/01/2018   Myalgia 07/13/2018   Chronic pain syndrome 10/07/2017   Degenerative disc disease, lumbar 08/17/2017   Hyperlipidemia 08/17/2017   Left sided numbness 12/01/2013   Acute sinusitis, unspecified 12/08/2012   Muscle spasm of right leg 12/08/2012   Acute upper respiratory infections of unspecified site 12/08/2012   Severe episode of recurrent major depressive disorder ([place]) 12/07/2012   Borderline personality disorder ([place]) 10/19/2012   Acute posttraumatic stress disorder 08/24/2012   Palpitations 08/16/2012   Neuropathy 08/16/2012   Restless leg syndrome 08/16/2012   Routine gynecological examination 07/31/2012   Neurogenic pain 04/12/2012   Anxiety 11/03/2011   Vitamin D deficiency 08/08/2011   Chronic back pain 05/29/2011   MVP (mitral valve prolapse) 05/29/2011   Asthma 05/29/2011   Tobacco user 05/29/2011   Chronic mastitis 05/29/2011   Allergic rhinitis 05/29/2011   Past Medical History:  Diagnosis Date   Anxiety    Arthritis     Asthma    Back pain, chronic    Bilateral carpal tunnel syndrome    Depression    DVT (deep vein thrombosis) in pregnancy    Environmental allergies    Epilepsy (HCC)    Fatigue    GERD (gastroesophageal reflux disease)    Headache    sinus   History of kidney stones    Mitral valve prolapse    Palpitations    Pericarditis    Pneumonia    PTSD (post-traumatic stress disorder)    Stenosis of lumbosacral spine    numbness L hand and L leg   Thrombophlebitis    Age 68   Wears glasses    Family History  Problem Relation Age of Onset   Diabetes Mother    Mitral valve prolapse Mother    Asthma Mother    CAD Mother 98   Anxiety disorder Mother    Mitral valve prolapse Sister    Asthma Sister    Mitral valve prolapse Son    Alcohol abuse Son    Diabetes Maternal Uncle    Diabetes Maternal Grandmother    Mitral valve prolapse Sister    CAD Son 23       No details available   Alcohol abuse Father    Alcohol abuse Brother    Past Surgical History:  Procedure Laterality Date   ABDOMINAL HYSTERECTOMY     partial   APPENDECTOMY     BIOPSY BREAST     Right breast x 10   BREAST LUMPECTOMY     Right breast   CARPAL TUNNEL RELEASE Left 11/10/2017   Procedure: LEFT CARPAL TUNNEL RELEASE;  Surgeon: Meredith Pel, MD;  Location: [facility];  Service: Orthopedics;  Laterality: Left;   CESAREAN SECTION     x 2   NOSE SURGERY     s/p trauma   OVARY SURGERY     RHINOPLASTY     TUBAL LIGATION     left tubal   UTERINE FIBROID SURGERY     Social History   Occupational History   Not on file  Tobacco Use   Smoking status: Current Every Day Smoker    Packs/day: 0.50    Years: 12.00  Pack years: 6.00    Types: Cigarettes   Smokeless tobacco: Never Used  Substance and Sexual Activity   Alcohol use: No    Alcohol/week: 0.0 standard drinks   Drug use: No   Sexual activity: Not Currently    Birth control/protection: None

## 2018-11-14 ENCOUNTER — Other Ambulatory Visit: Payer: Self-pay | Admitting: Physical Medicine and Rehabilitation

## 2018-11-14 ENCOUNTER — Telehealth: Payer: Self-pay | Admitting: Physical Medicine and Rehabilitation

## 2018-11-14 DIAGNOSIS — F411 Generalized anxiety disorder: Secondary | ICD-10-CM

## 2018-11-14 MED ORDER — DIAZEPAM 5 MG PO TABS: ORAL_TABLET | ORAL | 0 refills | Status: DC

## 2018-11-14 NOTE — Progress Notes (Signed)
Pre-procedure diazepam ordered for pre-operative anxiety.  

## 2018-11-14 NOTE — Telephone Encounter (Signed)
done

## 2018-11-15 ENCOUNTER — Ambulatory Visit (INDEPENDENT_AMBULATORY_CARE_PROVIDER_SITE_OTHER): Payer: Medicaid Other | Admitting: Physical Medicine and Rehabilitation

## 2018-11-15 ENCOUNTER — Ambulatory Visit: Payer: Self-pay

## 2018-11-15 ENCOUNTER — Encounter: Payer: Self-pay | Admitting: Physical Medicine and Rehabilitation

## 2018-11-15 VITALS — BP 113/77 | HR 90

## 2018-11-15 DIAGNOSIS — M47812 Spondylosis without myelopathy or radiculopathy, cervical region: Secondary | ICD-10-CM

## 2018-11-15 DIAGNOSIS — M542 Cervicalgia: Secondary | ICD-10-CM

## 2018-11-15 MED ORDER — METHYLPREDNISOLONE ACETATE 80 MG/ML IJ SUSP: 80.0000 mg | Freq: Once | INTRAMUSCULAR | Status: AC

## 2018-11-15 NOTE — Progress Notes (Signed)
Patient comes in today for planned left-sided multilevel facet joint diagnostic block.  We had gone over the procedure at pretty good detail at her office visit consultation.  She ended up reading a higher written consent form and had many questions about possible complications that were listed.  We did go over the list as usual verbally and I did explain to her how there is potential complications would arise and the very minimal chance of any of those happening.  She did want to think about the injection before she had it.  She is going to call us back.  Quick evaluation today continue to show pain with left-sided rotation of the cervical spine on the left side.  She does have a contrast allergy and this would be done without contrast.  Could consider using non-iodinated contrast since we are going to be epidural or intrathecal.  She has had no new changes since I saw her last.  She has had no red flag complaints of new weakness etc.

## 2018-11-15 NOTE — Progress Notes (Signed)
 .  Numeric Pain Rating Scale and Functional Assessment Average Pain 6   In the last MONTH (on 0-10 scale) has pain interfered with the following?  1. General activity like being  able to carry out your everyday physical activities such as walking, climbing stairs, carrying groceries, or moving a chair?  Rating(6)   +Driver, -BT, +Dye Allergies(Ivp Dye).

## 2018-11-28 ENCOUNTER — Telehealth: Payer: Self-pay | Admitting: Orthopedic Surgery

## 2018-11-28 NOTE — Telephone Encounter (Signed)
I called patient to follow up on scheduling her right carpal tunnel release.  Back in late June she had an infected tooth and said she would need to put off awhile.  She states the tooth is fine now, but she has a chest cold.  She would like to proceed with the surgery in the near future.  Patient has name and direct number for scheduling and is aware that our office is relocating. She will call when feeling better .

## 2019-01-12 ENCOUNTER — Encounter: Payer: Medicaid Other | Admitting: Psychology

## 2019-01-13 ENCOUNTER — Other Ambulatory Visit: Payer: Self-pay | Admitting: Physical Medicine & Rehabilitation

## 2019-01-25 ENCOUNTER — Encounter: Payer: Medicaid Other | Attending: Physical Medicine & Rehabilitation | Admitting: Psychology

## 2019-01-25 DIAGNOSIS — F4310 Post-traumatic stress disorder, unspecified: Secondary | ICD-10-CM | POA: Insufficient documentation

## 2019-01-25 DIAGNOSIS — Z79899 Other long term (current) drug therapy: Secondary | ICD-10-CM | POA: Insufficient documentation

## 2019-01-25 DIAGNOSIS — M545 Low back pain: Secondary | ICD-10-CM | POA: Insufficient documentation

## 2019-01-25 DIAGNOSIS — Z833 Family history of diabetes mellitus: Secondary | ICD-10-CM | POA: Insufficient documentation

## 2019-01-25 DIAGNOSIS — G479 Sleep disorder, unspecified: Secondary | ICD-10-CM | POA: Insufficient documentation

## 2019-01-25 DIAGNOSIS — Z8672 Personal history of thrombophlebitis: Secondary | ICD-10-CM | POA: Insufficient documentation

## 2019-01-25 DIAGNOSIS — F17210 Nicotine dependence, cigarettes, uncomplicated: Secondary | ICD-10-CM | POA: Insufficient documentation

## 2019-01-25 DIAGNOSIS — Z6832 Body mass index (BMI) 32.0-32.9, adult: Secondary | ICD-10-CM | POA: Insufficient documentation

## 2019-01-25 DIAGNOSIS — M791 Myalgia, unspecified site: Secondary | ICD-10-CM | POA: Insufficient documentation

## 2019-01-25 DIAGNOSIS — M542 Cervicalgia: Secondary | ICD-10-CM | POA: Insufficient documentation

## 2019-01-25 DIAGNOSIS — F329 Major depressive disorder, single episode, unspecified: Secondary | ICD-10-CM | POA: Insufficient documentation

## 2019-01-25 DIAGNOSIS — E559 Vitamin D deficiency, unspecified: Secondary | ICD-10-CM | POA: Insufficient documentation

## 2019-01-25 DIAGNOSIS — G40909 Epilepsy, unspecified, not intractable, without status epilepticus: Secondary | ICD-10-CM | POA: Insufficient documentation

## 2019-01-25 DIAGNOSIS — M48061 Spinal stenosis, lumbar region without neurogenic claudication: Secondary | ICD-10-CM | POA: Insufficient documentation

## 2019-01-25 DIAGNOSIS — F419 Anxiety disorder, unspecified: Secondary | ICD-10-CM | POA: Insufficient documentation

## 2019-01-25 DIAGNOSIS — E669 Obesity, unspecified: Secondary | ICD-10-CM | POA: Insufficient documentation

## 2019-01-25 DIAGNOSIS — J45909 Unspecified asthma, uncomplicated: Secondary | ICD-10-CM | POA: Insufficient documentation

## 2019-01-25 DIAGNOSIS — I341 Nonrheumatic mitral (valve) prolapse: Secondary | ICD-10-CM | POA: Insufficient documentation

## 2019-01-25 DIAGNOSIS — Z825 Family history of asthma and other chronic lower respiratory diseases: Secondary | ICD-10-CM | POA: Insufficient documentation

## 2019-01-25 DIAGNOSIS — Z8249 Family history of ischemic heart disease and other diseases of the circulatory system: Secondary | ICD-10-CM | POA: Insufficient documentation

## 2019-01-25 DIAGNOSIS — Z818 Family history of other mental and behavioral disorders: Secondary | ICD-10-CM | POA: Insufficient documentation

## 2019-02-08 ENCOUNTER — Encounter: Payer: Medicaid Other | Admitting: Psychology

## 2019-02-12 ENCOUNTER — Ambulatory Visit: Payer: Medicaid Other | Attending: Family Medicine | Admitting: Family Medicine

## 2019-02-12 ENCOUNTER — Other Ambulatory Visit: Payer: Self-pay

## 2019-02-12 DIAGNOSIS — J328 Other chronic sinusitis: Secondary | ICD-10-CM | POA: Diagnosis not present

## 2019-02-12 DIAGNOSIS — J4520 Mild intermittent asthma, uncomplicated: Secondary | ICD-10-CM | POA: Diagnosis not present

## 2019-02-12 DIAGNOSIS — J0101 Acute recurrent maxillary sinusitis: Secondary | ICD-10-CM

## 2019-02-12 DIAGNOSIS — G2581 Restless legs syndrome: Secondary | ICD-10-CM | POA: Diagnosis not present

## 2019-02-12 DIAGNOSIS — G894 Chronic pain syndrome: Secondary | ICD-10-CM

## 2019-02-12 MED ORDER — DOXYCYCLINE HYCLATE 100 MG PO TABS: 100.0000 mg | ORAL_TABLET | Freq: Two times a day (BID) | ORAL | 0 refills | Status: DC

## 2019-02-12 MED ORDER — FLUTICASONE PROPIONATE 50 MCG/ACT NA SUSP: 2.0000 | Freq: Every day | NASAL | 1 refills | Status: DC

## 2019-02-12 MED ORDER — GABAPENTIN 300 MG PO CAPS: 300.0000 mg | ORAL_CAPSULE | Freq: Every day | ORAL | 6 refills | Status: DC

## 2019-02-12 MED ORDER — ALBUTEROL SULFATE HFA 108 (90 BASE) MCG/ACT IN AERS: 2.0000 | INHALATION_SPRAY | Freq: Four times a day (QID) | RESPIRATORY_TRACT | 6 refills | Status: DC | PRN

## 2019-02-12 MED ORDER — ROPINIROLE HCL ER 2 MG PO TB24: 2.0000 mg | ORAL_TABLET | Freq: Every day | ORAL | 6 refills | Status: DC

## 2019-02-12 MED ORDER — CETIRIZINE HCL 10 MG PO TABS: 10.0000 mg | ORAL_TABLET | Freq: Every day | ORAL | 1 refills | Status: DC

## 2019-02-12 MED ORDER — METHOCARBAMOL 750 MG PO TABS: 750.0000 mg | ORAL_TABLET | Freq: Three times a day (TID) | ORAL | 6 refills | Status: DC | PRN

## 2019-02-12 MED ORDER — FLUTICASONE-SALMETEROL 250-50 MCG/DOSE IN AEPB: 1.0000 | INHALATION_SPRAY | Freq: Two times a day (BID) | RESPIRATORY_TRACT | 6 refills | Status: DC

## 2019-02-12 NOTE — Progress Notes (Signed)
Virtual Visit via Telephone Note  I connected with [name], on 02/12/2019 at 4:28 PM by telephone due to the COVID-19 pandemic and verified that I am speaking with the correct person using two identifiers.   Consent: I discussed the limitations, risks, security and privacy concerns of performing an evaluation and management service by telephone and the availability of in person appointments. I also discussed with the patient that there may be a patient responsible charge related to this service. The patient expressed understanding and agreed to proceed.   Location of Patient: Home  Location of Provider: Clinic   Persons participating in Telemedicine visit: Truth Wolaver Farrington-CMA Dr. Alvis Lemmings     History of Present Illness: Sylvia Hall is a 59 year old female with a history of  Asthma, chronic neck and lower back pain with associated radiculopathy (MRI of the c-spine and L spine from an outside hospital from 12/2006   revealed mild degenerative changes, annular bulges at L2-3,3-4,4-5 and neural foramina narrowing.C-spine reveals C3-4, 4-5 foraminal narrowing) seen for an acute visit.   She complains of a 9 day history of sinus symptoms and denies sick contact as she lives with only her Fiance.. 3 days ago she had a temp of 100F.Her head feels like it will explode, she has pressure beneath her eyes, nose has been congested, sinus pressure in her forehead and she also has a post nasal drip but no sore throat or myalgias. Tylenol sinus and cold have been ineffective.  She stopped seeing Dr Allena Katz for Pain management as nothing was helping; she will need her Gabapentin and Methocarbamol refilled Also needs refill on her asthma inhalers which have been effective in controlling her asthma symptoms. She is on Requip chronically for restless legs syndrome. Past Medical History:  Diagnosis Date  . Anxiety   . Arthritis   . Asthma   . Back pain, chronic   . Bilateral  carpal tunnel syndrome   . Depression   . DVT (deep vein thrombosis) in pregnancy   . Environmental allergies   . Epilepsy (HCC)   . Fatigue   . GERD (gastroesophageal reflux disease)   . Headache    sinus  . History of kidney stones   . Mitral valve prolapse   . Palpitations   . Pericarditis   . Pneumonia   . PTSD (post-traumatic stress disorder)   . Stenosis of lumbosacral spine    numbness L hand and L leg  . Thrombophlebitis    Age 59  . Wears glasses    Allergies  Allergen Reactions  . Ivp Dye [Iodinated Diagnostic Agents] Hives  . Levofloxacin Hives  . Sulfa Drugs Cross Reactors Hives  . Dilantin [Phenytoin Sodium Extended] Hives and Itching  . Aspirin Other (See Comments)    Upset stomach, resolves with GERD meds   . Caffeine Other (See Comments)    Heart races  . Cymbalta [Duloxetine Hcl] Diarrhea and Nausea Only    Dizzy  . Penicillins Rash    Pt called in stating she  Was prescribed Pen VK in ED and developed a generalized raised rash.  She has taken amoxicillin and augmentin since this time and not had any problem  . Pregabalin Rash  . Sodium Nitrate Other (See Comments)    headaches    Current Outpatient Medications on File Prior to Visit  Medication Sig Dispense Refill  . albuterol (PROVENTIL HFA;VENTOLIN HFA) 108 (90 Base) MCG/ACT inhaler Inhale 2 puffs into the lungs  every 6 (six) hours as needed for wheezing or shortness of breath. 1 Inhaler 1  . albuterol (PROVENTIL) (2.5 MG/3ML) 0.083% nebulizer solution Take 3 mLs (2.5 mg total) by nebulization every 6 (six) hours as needed for wheezing or shortness of breath. 150 mL 1  . cetirizine (ZYRTEC) 10 MG tablet Take 1 tablet (10 mg total) by mouth daily. 30 tablet 1  . diazepam (VALIUM) 5 MG tablet Take 1 by mouth 1 hour  pre-procedure with very light food. May bring 2nd tablet to appointment. 2 tablet 0  . fluticasone (FLONASE) 50 MCG/ACT nasal spray Place 2 sprays into both nostrils daily. 16 g 1  .  Fluticasone-Salmeterol (ADVAIR) 250-50 MCG/DOSE AEPB Inhale 1 puff into the lungs 2 (two) times daily. 60 each 6  . furosemide (LASIX) 20 MG tablet Take 1 tablet (20 mg total) by mouth daily. 30 tablet 0  . lidocaine (XYLOCAINE) 5 % ointment Apply 1 application topically as needed. 35.44 g 0  . methocarbamol (ROBAXIN) 750 MG tablet Take 1 tablet (750 mg total) by mouth every 8 (eight) hours as needed for muscle spasms. 90 tablet 0  . methylPREDNISolone (MEDROL DOSEPAK) 4 MG TBPK tablet As instruced (Patient not taking: Reported on 02/12/2019) 21 tablet 0  . metoprolol succinate (TOPROL-XL) 50 MG 24 hr tablet TAKE 1 TABLET BY MOUTH DAILY IMMEDIATELY FOLLOWING A MEAL. 60 tablet 11  . metoprolol tartrate (LOPRESSOR) 25 MG tablet Take 0.5 tablets (12.5 mg total) by mouth as needed. For palpitations 30 tablet 3  . potassium chloride SA (K-DUR) 20 MEQ tablet Take 1 tablet (20 mEq total) by mouth daily. 30 tablet 3  . pseudoephedrine-acetaminophen (TYLENOL SINUS) 30-500 MG TABS tablet Take 1 tablet by mouth every 4 (four) hours as needed.    Marland Kitchen rOPINIRole (REQUIP XL) 2 MG 24 hr tablet Take 1 tablet (2 mg total) by mouth at bedtime. 30 tablet 6  . Vitamin D, Ergocalciferol, (DRISDOL) 50000 units CAPS capsule Take 1 capsule (50,000 Units total) by mouth every 7 (seven) days. 16 capsule 0   Current Facility-Administered Medications on File Prior to Visit  Medication Dose Route Frequency Provider Last Rate Last Admin  . methylPREDNISolone acetate (DEPO-MEDROL) injection 80 mg  80 mg Other Once Magnus Sinning, MD        Observations/Objective: Awake, alert, oriented x3 Not in acute distress  Assessment and Plan: 1. Restless leg syndrome Stable - rOPINIRole (REQUIP XL) 2 MG 24 hr tablet; Take 1 tablet (2 mg total) by mouth at bedtime.  Dispense: 30 tablet; Refill: 6  2. Mild intermittent asthma without complication Controlled - Fluticasone-Salmeterol (ADVAIR) 250-50 MCG/DOSE AEPB; Inhale 1 puff into  the lungs 2 (two) times daily.  Dispense: 60 each; Refill: 6 - albuterol (VENTOLIN HFA) 108 (90 Base) MCG/ACT inhaler; Inhale 2 puffs into the lungs every 6 (six) hours as needed for wheezing or shortness of breath.  Dispense: 18 g; Refill: 6  3. Acute recurrent maxillary sinusitis Symptomatic measures have failed PCN allergic hence placed on Doxycycline Advised to use nasal irrigation - fluticasone (FLONASE) 50 MCG/ACT nasal spray; Place 2 sprays into both nostrils daily.  Dispense: 16 g; Refill: 1 - doxycycline (VIBRA-TABS) 100 MG tablet; Take 1 tablet (100 mg total) by mouth 2 (two) times daily.  Dispense: 20 tablet; Refill: 0  4. Other chronic sinusitis - cetirizine (ZYRTEC) 10 MG tablet; Take 1 tablet (10 mg total) by mouth daily.  Dispense: 30 tablet; Refill: 1   5. Chronic pain syndrome Uncontrolled  Previously followed by pain management - methocarbamol (ROBAXIN) 750 MG tablet; Take 1 tablet (750 mg total) by mouth every 8 (eight) hours as needed for muscle spasms.  Dispense: 90 tablet; Refill: 6   Follow Up Instructions: Keep previously scheduled appointment   I discussed the assessment and treatment plan with the patient. The patient was provided an opportunity to ask questions and all were answered. The patient agreed with the plan and demonstrated an understanding of the instructions.   The patient was advised to call back or seek an in-person evaluation if the symptoms worsen or if the condition fails to improve as anticipated.     I provided 18 minutes total of non-face-to-face time during this encounter including median intraservice time, reviewing previous notes, labs, imaging, medications, management and patient verbalized understanding.     Hoy Register, MD, FAAFP. [facility] [place], [place] [phone]   02/12/2019, 4:28 PM

## 2019-02-12 NOTE — Progress Notes (Signed)
Pt states she thinks she has a bad sinus infection or bronchitis  Pt states today will make the 9th day  Pt states she has been taking otc Tylenol sinus and cold formula. Pt states it has not helped   Pt states her head feels like it's going to explode  Symptoms -pressure around eyes -post nasal drip -cough(little) -headache

## 2019-02-13 ENCOUNTER — Encounter: Payer: Self-pay | Admitting: Family Medicine

## 2019-02-22 ENCOUNTER — Encounter: Payer: Medicaid Other | Admitting: Psychology

## 2019-03-31 ENCOUNTER — Encounter: Payer: Self-pay | Admitting: Cardiology

## 2019-03-31 DIAGNOSIS — Z7189 Other specified counseling: Secondary | ICD-10-CM | POA: Insufficient documentation

## 2019-03-31 NOTE — Progress Notes (Deleted)
Cardiology Office Note   Date:  03/31/2019   ID:  Sylvia Hall, DOB 23-Nov-1960, MRN [id number]  PCP:  Charlott Rakes, MD  Cardiologist:   No primary care provider on file.   No chief complaint on file.     History of Present Illness: Sylvia Hall is a 60 y.o. female who presents for follow up of mitral valve prolapse and palpitations.  I last saw her in 2019.   She had a history of MVP but I did not see this on echo in 2014.   Since I last saw her ***   she has done well.  She does still occasionally get palpitations.  This seems to happen about 1-2 times per week.  It might last for up to 15 minutes.  It is similar to her previous.  She has not had any syncope with it.  She does sometimes have a pulsation in her neck and head when she is trying to sleep the keeps her awake.  It might be some skipping beats.  She is not describing any new shortness of breath, PND or orthopnea.  She walks the dog and helps her husband who had a recent amputation.  She is lost about 30 pounds on a ketogenic diet.   Past Medical History:  Diagnosis Date  . Anxiety   . Arthritis   . Asthma   . Back pain, chronic   . Bilateral carpal tunnel syndrome   . Depression   . DVT (deep vein thrombosis) in pregnancy   . Environmental allergies   . Epilepsy ([place])   . Fatigue   . GERD (gastroesophageal reflux disease)   . Headache    sinus  . History of kidney stones   . Mitral valve prolapse   . Palpitations   . Pericarditis   . Pneumonia   . PTSD (post-traumatic stress disorder)   . Stenosis of lumbosacral spine    numbness L hand and L leg  . Thrombophlebitis    Age 45  . Wears glasses     Past Surgical History:  Procedure Laterality Date  . ABDOMINAL HYSTERECTOMY     partial  . APPENDECTOMY    . BIOPSY BREAST     Right breast x 10  . BREAST LUMPECTOMY     Right breast  . CARPAL TUNNEL RELEASE Left 11/10/2017   Procedure: LEFT CARPAL TUNNEL RELEASE;  Surgeon: Meredith Pel, MD;  Location: [facility];  Service: Orthopedics;  Laterality: Left;  . CESAREAN SECTION     x 2  . NOSE SURGERY     s/p trauma  . OVARY SURGERY    . RHINOPLASTY    . TUBAL LIGATION     left tubal  . UTERINE FIBROID SURGERY       Current Outpatient Medications  Medication Sig Dispense Refill  . albuterol (PROVENTIL) (2.5 MG/3ML) 0.083% nebulizer solution Take 3 mLs (2.5 mg total) by nebulization every 6 (six) hours as needed for wheezing or shortness of breath. 150 mL 1  . albuterol (VENTOLIN HFA) 108 (90 Base) MCG/ACT inhaler Inhale 2 puffs into the lungs every 6 (six) hours as needed for wheezing or shortness of breath. 18 g 6  . cetirizine (ZYRTEC) 10 MG tablet Take 1 tablet (10 mg total) by mouth daily. 30 tablet 1  . diazepam (VALIUM) 5 MG tablet Take 1 by mouth 1 hour  pre-procedure with very light food. May bring 2nd tablet to appointment. 2 tablet 0  .  doxycycline (VIBRA-TABS) 100 MG tablet Take 1 tablet (100 mg total) by mouth 2 (two) times daily. 20 tablet 0  . fluticasone (FLONASE) 50 MCG/ACT nasal spray Place 2 sprays into both nostrils daily. 16 g 1  . Fluticasone-Salmeterol (ADVAIR) 250-50 MCG/DOSE AEPB Inhale 1 puff into the lungs 2 (two) times daily. 60 each 6  . furosemide (LASIX) 20 MG tablet Take 1 tablet (20 mg total) by mouth daily. 30 tablet 0  . gabapentin (NEURONTIN) 300 MG capsule Take 1 capsule (300 mg total) by mouth at bedtime. 30 capsule 6  . lidocaine (XYLOCAINE) 5 % ointment Apply 1 application topically as needed. 35.44 g 0  . methocarbamol (ROBAXIN) 750 MG tablet Take 1 tablet (750 mg total) by mouth every 8 (eight) hours as needed for muscle spasms. 90 tablet 6  . methylPREDNISolone (MEDROL DOSEPAK) 4 MG TBPK tablet As instruced (Patient not taking: Reported on 02/12/2019) 21 tablet 0  . metoprolol succinate (TOPROL-XL) 50 MG 24 hr tablet TAKE 1 TABLET BY MOUTH DAILY IMMEDIATELY FOLLOWING A MEAL. 60 tablet 11  . metoprolol tartrate (LOPRESSOR) 25 MG  tablet Take 0.5 tablets (12.5 mg total) by mouth as needed. For palpitations 30 tablet 3  . potassium chloride SA (K-DUR) 20 MEQ tablet Take 1 tablet (20 mEq total) by mouth daily. 30 tablet 3  . pseudoephedrine-acetaminophen (TYLENOL SINUS) 30-500 MG TABS tablet Take 1 tablet by mouth every 4 (four) hours as needed.    Marland Kitchen rOPINIRole (REQUIP XL) 2 MG 24 hr tablet Take 1 tablet (2 mg total) by mouth at bedtime. 30 tablet 6  . Vitamin D, Ergocalciferol, (DRISDOL) 50000 units CAPS capsule Take 1 capsule (50,000 Units total) by mouth every 7 (seven) days. 16 capsule 0   Current Facility-Administered Medications  Medication Dose Route Frequency Provider Last Rate Last Admin  . methylPREDNISolone acetate (DEPO-MEDROL) injection 80 mg  80 mg Other Once Tyrell Antonio, MD        Allergies:   Ivp dye [iodinated diagnostic agents], Levofloxacin, Sulfa drugs cross reactors, Dilantin [phenytoin sodium extended], Aspirin, Caffeine, Cymbalta [duloxetine hcl], Penicillins, Pregabalin, and Sodium nitrate    ROS:  Please see the history of present illness.   Otherwise, review of systems are positive for ***.   All other systems are reviewed and negative.    PHYSICAL EXAM: VS:  There were no vitals taken for this visit. , BMI There is no height or weight on file to calculate BMI.  GENERAL:  Well appearing NECK:  No jugular venous distention, waveform within normal limits, carotid upstroke brisk and symmetric, no bruits, no thyromegaly LUNGS:  Clear to auscultation bilaterally CHEST:  Unremarkable HEART:  PMI not displaced or sustained,S1 and S2 within normal limits, no S3, no S4, no clicks, no rubs, *** murmurs ABD:  Flat, positive bowel sounds normal in frequency in pitch, no bruits, no rebound, no guarding, no midline pulsatile mass, no hepatomegaly, no splenomegaly EXT:  2 plus pulses throughout, no edema, no cyanosis no clubbing    ***GENERAL:  Well appearing NECK:  No jugular venous distention,  waveform within normal limits, carotid upstroke brisk and symmetric, no bruits, no thyromegaly LUNGS:  Clear to auscultation bilaterally CHEST:  Unremarkable HEART:  PMI not displaced or sustained,S1 and S2 within normal limits, no S3, no S4, no clicks, no rubs, no murmurs ABD:  Flat, positive bowel sounds normal in frequency in pitch, no bruits, no rebound, no guarding, no midline pulsatile mass, no hepatomegaly, no splenomegaly EXT:  2  plus pulses throughout, no edema, no cyanosis no clubbing    EKG:  EKG is *** ordered today. The ekg ordered today demonstrates sinus rhythm, rate ***, axis within normal limits, intervals within normal limits, no acute ST-T wave changes.   Recent Labs: 06/25/2018: B Natriuretic Peptide 19.9; BUN 16; Creatinine, Ser 1.12; Hemoglobin 14.1; Platelets 172; Potassium 3.3; Sodium 137    Lipid Panel    Component Value Date/Time   CHOL 214 (H) 05/19/2017 1021   TRIG 98 05/19/2017 1021   HDL 62 05/19/2017 1021   CHOLHDL 3.5 05/19/2017 1021   CHOLHDL 3.4 12/01/2013 1615   VLDL 23 12/01/2013 1615   LDLCALC 132 (H) 05/19/2017 1021      Wt Readings from Last 3 Encounters:  08/10/18 209 lb (94.8 kg)  08/02/18 207 lb (93.9 kg)  07/31/18 207 lb 9.6 oz (94.2 kg)      Other studies Reviewed: Additional studies/ records that were reviewed today include: *** Review of the above records demonstrates:  ***   ASSESSMENT AND PLAN:    MVP:  There was no evidence of this on echo in 2014.  ***  I do not hear any clinical evidence of prolapse or regurgitation and no further imaging is indicated.  PALPITATIONS:   ***  She continues to be bothered by occasional palpitations sometimes at night.  I am going to give her immediate release metoprolol to take 12.5 mg nightly.  COVID ED:  ***   Current medicines are reviewed at length with the patient today.  The patient does not have concerns regarding medicines.  The following changes have been made:  ***   Labs/ tests ordered today include: ***  No orders of the defined types were placed in this encounter.    Disposition:   FU with me in *** years.     Signed, Rollene Rotunda, MD  03/31/2019 9:48 PM    [facility]

## 2019-04-02 ENCOUNTER — Ambulatory Visit: Payer: Medicaid Other | Admitting: Cardiology

## 2019-04-05 ENCOUNTER — Other Ambulatory Visit: Payer: Self-pay | Admitting: Family Medicine

## 2019-04-05 DIAGNOSIS — J328 Other chronic sinusitis: Secondary | ICD-10-CM

## 2019-04-05 DIAGNOSIS — J0101 Acute recurrent maxillary sinusitis: Secondary | ICD-10-CM

## 2019-05-02 ENCOUNTER — Other Ambulatory Visit: Payer: Self-pay | Admitting: Cardiology

## 2019-05-20 NOTE — Progress Notes (Signed)
Cardiology Office Note   Date:  05/22/2019   ID:  Sylvia Hall, DOB 27-Feb-1960, MRN [id number]  PCP:  Hoy Register, MD  Cardiologist:   Rollene Rotunda, MD   Chief Complaint  Patient presents with  . Palpitations      History of Present Illness: Sylvia Hall is a 59 y.o. female who presents for follow up of mitral valve prolapse and palpitations.  I last saw her in 2019. She had a history of MVP but I did not see this on echo in 2014.   Since I last saw her she she has had recurrent palpitations.  She says this happens every time she eats.  She was taking an extra 25 mg of metoprolol but she ran out of the immediate release.  She is not drinking caffeine or eating caffeine.  Her heart will start racing.  It will be forceful.  It happens routinely half hour after she eats and lasts for an hour.  She will sporadically have it occasionally when she is lying down at night.  She is not had any presyncope or syncope with it.  She thinks it somewhat similar to what it was previously.  She was noted to have low magnesium and potassium in the past and wondered if this could be related.  She is not describing chest pressure, neck or arm discomfort.  She is not describing new weight gain or edema.   Past Medical History:  Diagnosis Date  . Anxiety   . Arthritis   . Asthma   . Back pain, chronic   . Bilateral carpal tunnel syndrome   . Depression   . DVT (deep vein thrombosis) in pregnancy   . Environmental allergies   . Epilepsy ([facility])   . Fatigue   . GERD (gastroesophageal reflux disease)   . Headache    sinus  . History of kidney stones   . Mitral valve prolapse   . Pericarditis   . PTSD (post-traumatic stress disorder)   . Stenosis of lumbosacral spine    numbness L hand and L leg  . Thrombophlebitis    Age 73    Past Surgical History:  Procedure Laterality Date  . ABDOMINAL HYSTERECTOMY     partial  . APPENDECTOMY    . BIOPSY BREAST     Right breast x 10  .  BREAST LUMPECTOMY     Right breast  . CARPAL TUNNEL RELEASE Left 11/10/2017   Procedure: LEFT CARPAL TUNNEL RELEASE;  Surgeon: Cammy Copa, MD;  Location: [facility];  Service: Orthopedics;  Laterality: Left;  . CESAREAN SECTION     x 2  . NOSE SURGERY     s/p trauma  . OVARY SURGERY    . RHINOPLASTY    . TUBAL LIGATION     left tubal  . UTERINE FIBROID SURGERY       Current Outpatient Medications  Medication Sig Dispense Refill  . albuterol (PROVENTIL) (2.5 MG/3ML) 0.083% nebulizer solution Take 3 mLs (2.5 mg total) by nebulization every 6 (six) hours as needed for wheezing or shortness of breath. 150 mL 1  . albuterol (VENTOLIN HFA) 108 (90 Base) MCG/ACT inhaler Inhale 2 puffs into the lungs every 6 (six) hours as needed for wheezing or shortness of breath. 18 g 6  . cetirizine (ZYRTEC) 10 MG tablet TAKE 1 TABLET BY MOUTH EVERY DAY 30 tablet 1  . fluticasone (FLONASE) 50 MCG/ACT nasal spray SPRAY 2 SPRAYS INTO EACH NOSTRIL EVERY  DAY 16 mL 1  . Fluticasone-Salmeterol (ADVAIR) 250-50 MCG/DOSE AEPB Inhale 1 puff into the lungs 2 (two) times daily. 60 each 6  . gabapentin (NEURONTIN) 300 MG capsule Take 1 capsule (300 mg total) by mouth at bedtime. 30 capsule 6  . lidocaine (XYLOCAINE) 5 % ointment Apply 1 application topically as needed. 35.44 g 0  . methylPREDNISolone (MEDROL DOSEPAK) 4 MG TBPK tablet As instruced (Patient not taking: Reported on 02/12/2019) 21 tablet 0  . metoprolol succinate (TOPROL-XL) 50 MG 24 hr tablet TAKE 1 TABLET BY MOUTH EVERY DAY IMMEDIATELY FOLLOW A MEAL 90 tablet 0  . metoprolol tartrate (LOPRESSOR) 25 MG tablet Take 0.5 tablets (12.5 mg total) by mouth as needed. For palpitations 30 tablet 3  . potassium chloride SA (K-DUR) 20 MEQ tablet Take 1 tablet (20 mEq total) by mouth daily. 30 tablet 3  . pseudoephedrine-acetaminophen (TYLENOL SINUS) 30-500 MG TABS tablet Take 1 tablet by mouth every 4 (four) hours as needed.    Marland Kitchen rOPINIRole (REQUIP XL) 2 MG 24 hr  tablet Take 1 tablet (2 mg total) by mouth at bedtime. 30 tablet 6  . Vitamin D, Ergocalciferol, (DRISDOL) 50000 units CAPS capsule Take 1 capsule (50,000 Units total) by mouth every 7 (seven) days. 16 capsule 0   Current Facility-Administered Medications  Medication Dose Route Frequency Provider Last Rate Last Admin  . methylPREDNISolone acetate (DEPO-MEDROL) injection 80 mg  80 mg Other Once Magnus Sinning, MD        Allergies:   Ivp dye [iodinated diagnostic agents], Levofloxacin, Sulfa drugs cross reactors, Dilantin [phenytoin sodium extended], Aspirin, Caffeine, Cymbalta [duloxetine hcl], Penicillins, Pregabalin, and Sodium nitrate    ROS:  Please see the history of present illness.   Otherwise, review of systems are positive for none.   All other systems are reviewed and negative.    PHYSICAL EXAM: VS:  BP 110/70   Pulse 78   Temp 97.7 F (36.5 C)   Ht 5' 3.25" (1.607 m)   Wt 200 lb (90.7 kg)   SpO2 98%   BMI 35.15 kg/m  , BMI Body mass index is 35.15 kg/m.  GENERAL:  Well appearing NECK:  No jugular venous distention, waveform within normal limits, carotid upstroke brisk and symmetric, no bruits, no thyromegaly LUNGS:  Clear to auscultation bilaterally CHEST:  Unremarkable HEART:  PMI not displaced or sustained,S1 and S2 within normal limits, no S3, no S4, no clicks, no rubs, no murmurs ABD:  Flat, positive bowel sounds normal in frequency in pitch, no bruits, no rebound, no guarding, no midline pulsatile mass, no hepatomegaly, no splenomegaly EXT:  2 plus pulses throughout, no edema, no cyanosis no clubbing   EKG:  EKG is  ordered today. The ekg ordered today demonstrates sinus rhythm, rate 78, axis within normal limits, intervals within normal limits, nonspecific T wave inversions.    Recent Labs: 06/25/2018: B Natriuretic Peptide 19.9; BUN 16; Creatinine, Ser 1.12; Hemoglobin 14.1; Platelets 172; Potassium 3.3; Sodium 137    Lipid Panel    Component Value  Date/Time   CHOL 214 (H) 05/19/2017 1021   TRIG 98 05/19/2017 1021   HDL 62 05/19/2017 1021   CHOLHDL 3.5 05/19/2017 1021   CHOLHDL 3.4 12/01/2013 1615   VLDL 23 12/01/2013 1615   LDLCALC 132 (H) 05/19/2017 1021      Wt Readings from Last 3 Encounters:  05/22/19 200 lb (90.7 kg)  08/10/18 209 lb (94.8 kg)  08/02/18 207 lb (93.9 kg)  Other studies Reviewed: Additional studies/ records that were reviewed today include:   None Review of the above records demonstrates:  Please see elsewhere in the note.     ASSESSMENT AND PLAN:    MVP:   I have no suspicion of mitral valve prolapse having had no evidence of this on her most recent echo in 2014 and no change in her physical exam.  However, if she continues to have palpitations I will have a low threshold for repeat imaging.   PALPITATIONS: She is going to get a TSH, magnesium and basic metabolic profile.  I will give her a prescription for her immediate release metoprolol to take as needed.  She will wear a 2-week event monitor.   ABNORMAL EKG: Her symptoms are very atypical for an anginal equivalent.  She does have some nonspecific T wave changes.  If she has continued symptoms she should have a POET (Plain Old Exercise Treadmill).   COVID EDUCATION:   She was hesitant about getting the vaccine and we talked about this today.   Current medicines are reviewed at length with the patient today.  The patient does not have concerns regarding medicines.  The following changes have been made:  no change  Labs/ tests ordered today include: None  Orders Placed This Encounter  Procedures  . Basic metabolic panel  . Magnesium  . TSH  . LONG TERM MONITOR (3-14 DAYS)  . EKG 12-Lead     Disposition:   FU with me in 2 years.     Signed, Rollene Rotunda, MD  05/22/2019 8:34 PM    [facility]

## 2019-05-22 ENCOUNTER — Encounter: Payer: Self-pay | Admitting: Cardiology

## 2019-05-22 ENCOUNTER — Ambulatory Visit (INDEPENDENT_AMBULATORY_CARE_PROVIDER_SITE_OTHER): Payer: Medicare Other | Admitting: Cardiology

## 2019-05-22 ENCOUNTER — Other Ambulatory Visit: Payer: Self-pay

## 2019-05-22 VITALS — BP 110/70 | HR 78 | Temp 97.7°F | Ht 63.25 in | Wt 200.0 lb

## 2019-05-22 DIAGNOSIS — Z7189 Other specified counseling: Secondary | ICD-10-CM | POA: Diagnosis not present

## 2019-05-22 DIAGNOSIS — R002 Palpitations: Secondary | ICD-10-CM | POA: Diagnosis not present

## 2019-05-22 DIAGNOSIS — I341 Nonrheumatic mitral (valve) prolapse: Secondary | ICD-10-CM | POA: Diagnosis not present

## 2019-05-22 MED ORDER — METOPROLOL TARTRATE 25 MG PO TABS: 12.5000 mg | ORAL_TABLET | ORAL | 3 refills | Status: AC | PRN

## 2019-05-22 NOTE — Patient Instructions (Signed)
Medication Instructions:  NO CHANGES *If you need a refill on your cardiac medications before your next appointment, please call your pharmacy*  Lab Work: Your physician recommends that you return for lab work THIS WEEK (MAG, BMP, TSH)  Testing/Procedures: Your physician has recommended that you wear an event monitor. Event monitors are medical devices that record the heart's electrical activity. Doctors most often [place] these monitors to diagnose arrhythmias. Arrhythmias are problems with the speed or rhythm of the heartbeat. The monitor is a small, portable device. You can wear one while you do your normal daily activities. This is usually used to diagnose what is causing palpitations/syncope (passing out).  Follow-Up: At [facility], you and your health needs are our priority.  As part of our continuing mission to provide you with exceptional heart care, we have created designated Provider Care Teams.  These Care Teams include your primary Cardiologist (physician) and Advanced Practice Providers (APPs -  Physician Assistants and Nurse Practitioners) who all work together to provide you with the care you need, when you need it.  Your next appointment:   1 month(s)  The format for your next appointment:   In Person  Provider:   Joni Reining, NP  Other Instructions Your physician has recommended that you wear a 14 DAY ZIO-PATCH monitor. The Zio patch cardiac monitor continuously records heart rhythm data for up to 14 days, this is for patients being evaluated for multiple types heart rhythms. For the first 24 hours post application, please avoid getting the Zio monitor wet in the shower or by excessive sweating during exercise. After that, feel free to carry on with regular activities. Keep soaps and lotions away from the ZIO XT Patch.  This will be mailed to you, please expect 7-10 days to receive.  Can be placed at our [facility] location - [address], [address].

## 2019-05-23 ENCOUNTER — Encounter: Payer: Self-pay | Admitting: *Deleted

## 2019-05-23 NOTE — Progress Notes (Signed)
Patient ID: Sylvia Hall, female   DOB: 31-May-1960, 59 y.o.   MRN: [id number] Patient enrolled for [employer] to mail a 14 day ZIO XT long term holter monitor to her home.  Instructions sent to patient via My Chart Message and will be included in her monitor kit as well.

## 2019-05-25 LAB — TSH: TSH: 0.008 u[IU]/mL — ABNORMAL LOW (ref 0.450–4.500)

## 2019-05-25 LAB — BASIC METABOLIC PANEL
BUN/Creatinine Ratio: 21 (ref 9–23)
BUN: 15 mg/dL (ref 6–24)
CO2: 23 mmol/L (ref 20–29)
Calcium: 9.6 mg/dL (ref 8.7–10.2)
Chloride: 104 mmol/L (ref 96–106)
Creatinine, Ser: 0.73 mg/dL (ref 0.57–1.00)
GFR calc Af Amer: 104 mL/min/{1.73_m2} (ref >59)
GFR calc non Af Amer: 90 mL/min/{1.73_m2} (ref >59)
Glucose: 83 mg/dL (ref 65–99)
Potassium: 4.3 mmol/L (ref 3.5–5.2)
Sodium: 140 mmol/L (ref 134–144)

## 2019-05-25 LAB — MAGNESIUM: Magnesium: 1.8 mg/dL (ref 1.6–2.3)

## 2019-05-29 ENCOUNTER — Ambulatory Visit: Payer: Medicare Other

## 2019-05-29 ENCOUNTER — Telehealth: Payer: Self-pay | Admitting: Family Medicine

## 2019-05-29 NOTE — Telephone Encounter (Signed)
Can you please schedule her for an in person office visit with me to discuss abnormal thyroid labs? Thanks.

## 2019-05-31 NOTE — Telephone Encounter (Signed)
Patient was called and their is no voicemail set up to leave a message.

## 2019-06-05 ENCOUNTER — Telehealth: Payer: Self-pay | Admitting: Cardiology

## 2019-06-05 NOTE — Telephone Encounter (Signed)
Spoke with patient. She has developed blisters and skin irritation. She is unable to complete the ordered 14 days. Patient will remove heart monitor and send it back in. Will forward to MD as an FYI. She reports she is waiting to be seen by someone to address her thyroid as well.

## 2019-06-05 NOTE — Telephone Encounter (Signed)
New Message  Patient is calling in regards to her monitor. States that she has had it on for 7 days and the adhesive is causing blisters on her skin. States that she wants to take it off. Wants to see if that is ok. Please give patient a call back to advise.

## 2019-06-06 NOTE — Telephone Encounter (Signed)
Patient returned phone call on 06/05/2018 and was set up with an appointment on 06/18/19 with PCP

## 2019-06-13 ENCOUNTER — Ambulatory Visit (INDEPENDENT_AMBULATORY_CARE_PROVIDER_SITE_OTHER): Payer: Medicare Other

## 2019-06-13 ENCOUNTER — Other Ambulatory Visit: Payer: Self-pay

## 2019-06-13 ENCOUNTER — Encounter: Payer: Self-pay | Admitting: Orthopedic Surgery

## 2019-06-13 ENCOUNTER — Ambulatory Visit (INDEPENDENT_AMBULATORY_CARE_PROVIDER_SITE_OTHER): Payer: Medicare Other | Admitting: Orthopedic Surgery

## 2019-06-13 DIAGNOSIS — G8929 Other chronic pain: Secondary | ICD-10-CM

## 2019-06-13 DIAGNOSIS — M25572 Pain in left ankle and joints of left foot: Secondary | ICD-10-CM

## 2019-06-13 DIAGNOSIS — M25561 Pain in right knee: Secondary | ICD-10-CM

## 2019-06-13 NOTE — Progress Notes (Signed)
Office Visit Note   Patient: Sylvia Hall           Date of Birth: 1960-09-03           MRN: [id number] Visit Date: 06/13/2019 Requested by: Charlott Rakes, MD [place],  [place] [id number] PCP: Charlott Rakes, MD  Subjective: Chief Complaint  Patient presents with  . Right Knee - Pain  . Left Ankle - Pain    HPI: Sylvia Hall is a patient with left ankle and right knee pain.  No history of gout related to the ankle or knee.  Has had pain in that left ankle for 4 months.  Denies any injury.  Reports mechanical symptoms and focal nerve type pain in the lateral malleolus for 4 months.  Reports some burning type pain.  Reports pain which is little worse with weightbearing but no back pain.  Does not radiate into the dorsal or plantar aspect of the left foot.  No right foot symptoms.  Patient also reports right knee pain.  Had an injection in June of last year.  His old charts were reviewed.  Helped for 2 weeks and then her symptoms recurred.  Describes weakness and giving way.  She states she is fallen a few times.  Has new popping and clicking in her knee.  Currently is being treated for a thyroid issue giving her hair loss.  Takes Tylenol and ibuprofen for her symptoms.              ROS: All systems reviewed are negative as they relate to the chief complaint within the history of present illness.  Patient denies  fevers or chills.   Assessment & Plan: Visit Diagnoses:  1. Chronic pain of right knee   2. Pain in left ankle and joints of left foot     Plan: Impression is 4 months history of left ankle pain with mechanical symptoms on exam.  May be some type of atypical cystic structure in the sinus Tarsi region versus partial tendon rupture versus chondral defect lateral talar dome.  Needs MRI scan of the left ankle for further evaluation of of that lateral sided pain.  Symptoms for 4 months with failure of conservative treatment including activity modification bracing and  anti-inflammatories.  Regarding the right knee she had an injection in June.  Only lasted several weeks.  Not much arthritis on radiographs.  I think she may have meniscal root tear versus meniscal pathology.  Needs MRI scan on that right knee for further evaluation and management.  Follow-up after the studies.  Follow-Up Instructions: Return for after MRI.   Orders:  Orders Placed This Encounter  Procedures  . XR Ankle Complete Left  . XR KNEE 3 VIEW RIGHT  . MR Knee Right w/o contrast  . MR Ankle Left w/o contrast   No orders of the defined types were placed in this encounter.     Procedures: No procedures performed   Clinical Data: No additional findings.  Objective: Vital Signs: There were no vitals taken for this visit.  Physical Exam:   Constitutional: Patient appears well-developed HEENT:  Head: Normocephalic Eyes:EOM are normal Neck: Normal range of motion Cardiovascular: Normal rate Pulmonary/chest: Effort normal Neurologic: Patient is alert Skin: Skin is warm Psychiatric: Patient has normal mood and affect    Ortho Exam: Ortho exam demonstrates slightly antalgic gait to the left.  Does have some pain with forced eversion on the left side compared to the right.  No  peroneal tendon subluxation on the left.  Tenderness in the sinus Tarsi region is present with some clicking present with ankle dorsiflexion plantarflexion.  Otherwise patient has palpable pedal pulses intact sensation of the dorsal plantar aspect of the foot.  Palpable intact nontender anterior to posterior to peroneal Achilles tendons.  No other masses lymphadenopathy or skin changes noted in that left knee region.  Or left foot region.  Right knee is examined.  No effusion but she has medial and lateral joint line tenderness with equivocal McMurray compression testing.  Extensor mechanism is intact.  Pedal pulses palpable.  Range of motion full.  Collateral and cruciate ligaments stable.  Specialty  Comments:  No specialty comments available.  Imaging: XR Ankle Complete Left  Result Date: 06/13/2019 AP lateral mortise left ankle reviewed.  No acute fracture.  No arthritis.  Mortise is symmetric.  Normal left ankle  XR KNEE 3 VIEW RIGHT  Result Date: 06/13/2019 AP lateral merchant right knee reviewed.  No acute fracture.  Minimal degenerative change.  Alignment intact.  Patella height normal relative to the femur.    PMFS History: Patient Active Problem List   Diagnosis Date Noted  . Educated about COVID-19 virus infection 03/31/2019  . Tobacco abuse 08/10/2018  . Obesity (BMI 35.0-39.9 without comorbidity) 08/01/2018  . Myalgia 07/13/2018  . Chronic pain syndrome 10/07/2017  . Degenerative disc disease, lumbar 08/17/2017  . Hyperlipidemia 08/17/2017  . Left sided numbness 12/01/2013  . Acute sinusitis, unspecified 12/08/2012  . Muscle spasm of right leg 12/08/2012  . Acute upper respiratory infections of unspecified site 12/08/2012  . Severe episode of recurrent major depressive disorder (HCC) 12/07/2012  . Borderline personality disorder (HCC) 10/19/2012  . Acute posttraumatic stress disorder 08/24/2012  . Palpitations 08/16/2012  . Neuropathy 08/16/2012  . Restless leg syndrome 08/16/2012  . Routine gynecological examination 07/31/2012  . Neurogenic pain 04/12/2012  . Anxiety 11/03/2011  . Vitamin D deficiency 08/08/2011  . Chronic back pain 05/29/2011  . MVP (mitral valve prolapse) 05/29/2011  . Asthma 05/29/2011  . Tobacco user 05/29/2011  . Chronic mastitis 05/29/2011  . Allergic rhinitis 05/29/2011   Past Medical History:  Diagnosis Date  . Anxiety   . Arthritis   . Asthma   . Back pain, chronic   . Bilateral carpal tunnel syndrome   . Depression   . DVT (deep vein thrombosis) in pregnancy   . Environmental allergies   . Epilepsy (HCC)   . Fatigue   . GERD (gastroesophageal reflux disease)   . Headache    sinus  . History of kidney stones   .  Mitral valve prolapse   . Pericarditis   . PTSD (post-traumatic stress disorder)   . Stenosis of lumbosacral spine    numbness L hand and L leg  . Thrombophlebitis    Age 25    Family History  Problem Relation Age of Onset  . Diabetes Mother   . Mitral valve prolapse Mother   . Asthma Mother   . CAD Mother 54  . Anxiety disorder Mother   . Mitral valve prolapse Sister   . Asthma Sister   . Mitral valve prolapse Son   . Alcohol abuse Son   . Diabetes Maternal Uncle   . Diabetes Maternal Grandmother   . Mitral valve prolapse Sister   . CAD Son 53       No details available  . Alcohol abuse Father   . Alcohol abuse Brother     Past  Surgical History:  Procedure Laterality Date  . ABDOMINAL HYSTERECTOMY     partial  . APPENDECTOMY    . BIOPSY BREAST     Right breast x 10  . BREAST LUMPECTOMY     Right breast  . CARPAL TUNNEL RELEASE Left 11/10/2017   Procedure: LEFT CARPAL TUNNEL RELEASE;  Surgeon: Cammy Copa, MD;  Location: [facility] OR;  Service: Orthopedics;  Laterality: Left;  . CESAREAN SECTION     x 2  . NOSE SURGERY     s/p trauma  . OVARY SURGERY    . RHINOPLASTY    . TUBAL LIGATION     left tubal  . UTERINE FIBROID SURGERY     Social History   Occupational History  . Not on file  Tobacco Use  . Smoking status: Current Every Day Smoker    Packs/day: 0.50    Years: 12.00    Pack years: 6.00    Types: Cigarettes  . Smokeless tobacco: Never Used  Substance and Sexual Activity  . Alcohol use: No    Alcohol/week: 0.0 standard drinks  . Drug use: No  . Sexual activity: Not Currently    Birth control/protection: None

## 2019-06-14 ENCOUNTER — Encounter: Payer: Medicare Other | Attending: Psychology | Admitting: Psychology

## 2019-06-18 ENCOUNTER — Encounter: Payer: Self-pay | Admitting: Family Medicine

## 2019-06-18 ENCOUNTER — Other Ambulatory Visit: Payer: Self-pay

## 2019-06-18 ENCOUNTER — Ambulatory Visit: Payer: Medicare Other | Attending: Family Medicine | Admitting: Family Medicine

## 2019-06-18 VITALS — BP 93/64 | HR 74 | Ht 63.0 in | Wt 201.4 lb

## 2019-06-18 DIAGNOSIS — G4700 Insomnia, unspecified: Secondary | ICD-10-CM | POA: Diagnosis not present

## 2019-06-18 DIAGNOSIS — F17210 Nicotine dependence, cigarettes, uncomplicated: Secondary | ICD-10-CM | POA: Diagnosis not present

## 2019-06-18 DIAGNOSIS — M542 Cervicalgia: Secondary | ICD-10-CM | POA: Diagnosis not present

## 2019-06-18 DIAGNOSIS — M549 Dorsalgia, unspecified: Secondary | ICD-10-CM | POA: Insufficient documentation

## 2019-06-18 DIAGNOSIS — Z7901 Long term (current) use of anticoagulants: Secondary | ICD-10-CM | POA: Diagnosis not present

## 2019-06-18 DIAGNOSIS — M1990 Unspecified osteoarthritis, unspecified site: Secondary | ICD-10-CM | POA: Insufficient documentation

## 2019-06-18 DIAGNOSIS — J320 Chronic maxillary sinusitis: Secondary | ICD-10-CM | POA: Insufficient documentation

## 2019-06-18 DIAGNOSIS — L603 Nail dystrophy: Secondary | ICD-10-CM | POA: Diagnosis not present

## 2019-06-18 DIAGNOSIS — G894 Chronic pain syndrome: Secondary | ICD-10-CM | POA: Diagnosis not present

## 2019-06-18 DIAGNOSIS — R7989 Other specified abnormal findings of blood chemistry: Secondary | ICD-10-CM | POA: Diagnosis not present

## 2019-06-18 DIAGNOSIS — J45909 Unspecified asthma, uncomplicated: Secondary | ICD-10-CM | POA: Diagnosis not present

## 2019-06-18 DIAGNOSIS — E039 Hypothyroidism, unspecified: Secondary | ICD-10-CM | POA: Insufficient documentation

## 2019-06-18 DIAGNOSIS — Z79899 Other long term (current) drug therapy: Secondary | ICD-10-CM | POA: Insufficient documentation

## 2019-06-18 DIAGNOSIS — I341 Nonrheumatic mitral (valve) prolapse: Secondary | ICD-10-CM | POA: Insufficient documentation

## 2019-06-18 DIAGNOSIS — J3089 Other allergic rhinitis: Secondary | ICD-10-CM

## 2019-06-18 DIAGNOSIS — R002 Palpitations: Secondary | ICD-10-CM | POA: Diagnosis not present

## 2019-06-18 NOTE — Patient Instructions (Signed)

## 2019-06-18 NOTE — Progress Notes (Signed)
Established Patient Office Visit  Subjective:  Patient ID: Sylvia Hall, female    DOB: April 21, 1960  Age: 59 y.o. MRN: [id number]  CC:  Chief Complaint  Patient presents with  . Hypothyroidism    HPI Sylvia Hall is 59 yo female with a past medical history of asthma, chronic neck and back pain, and recurrent sinusitis seen for follow up TSH labs. Sylvia Hall was seen by Cardiology for a follow up for recurrent palpitations and EKG (showed normal sinus rhythm and non-specific T wave inversions) , and TSH labs were checked on 05/25/19 and discovered to be low. The cardiologist prescribed 25 mg of rapid acting Metoprolol and 50 mg Metoprolol daily. The cardiologist also ordered 14 days of a Holter Monitor, but she was unable to wear more than 7 days.  Sylvia Hall reports that the palpitations have been present for a few months only a daily basis. They occur at random times throughout the day, but are more noticeable after she eats or lays down at night. She reports that they keep her up at night, but taking the 25mg  of metoprolol provides some relief. She reports fatigue but attributes this to her insomnia. She does not report weight loss (in fact she believes she has gained weight), and does not report heat intolerance (instead she feels cold, and wears extra layers of clothing. She reports noticeable ridges in her nails and thinning hair around her temples. She eats 1x a day, but reports lack of satiety after eating and snacks throughout the day and night. She does not report any changes in bowel movements.   Sylvia Hall is currently taking methocarbamol for her chronic neck and back pain. She reports that she gets neck spasms daily and her daily pain is 5-6 out of 10. She reports numbness in her fingertips but believes this is attributed to her bilateral carpal tunnel syndrome. She is currently taking Gabapentin 300mg  and reports this provides some relief of symptoms.   She has recurrent maxillary  sinusitis and has tried Fluticasone, Certizine and nasal irrigation with minimal relief and would like a referral to an allergy specialist.   Past Medical History:  Diagnosis Date  . Anxiety   . Arthritis   . Asthma   . Back pain, chronic   . Bilateral carpal tunnel syndrome   . Depression   . DVT (deep vein thrombosis) in pregnancy   . Environmental allergies   . Epilepsy ([place])   . Fatigue   . GERD (gastroesophageal reflux disease)   . Headache    sinus  . History of kidney stones   . Mitral valve prolapse   . Pericarditis   . PTSD (post-traumatic stress disorder)   . Stenosis of lumbosacral spine    numbness L hand and L leg  . Thrombophlebitis    Age 59    Past Surgical History:  Procedure Laterality Date  . ABDOMINAL HYSTERECTOMY     partial  . APPENDECTOMY    . BIOPSY BREAST     Right breast x 10  . BREAST LUMPECTOMY     Right breast  . CARPAL TUNNEL RELEASE Left 11/10/2017   Procedure: LEFT CARPAL TUNNEL RELEASE;  Surgeon: Meredith Pel, MD;  Location: [place];  Service: Orthopedics;  Laterality: Left;  . CESAREAN SECTION     x 2  . NOSE SURGERY     s/p trauma  . OVARY SURGERY    . RHINOPLASTY    . TUBAL LIGATION  left tubal  . UTERINE FIBROID SURGERY      Family History  Problem Relation Age of Onset  . Diabetes Mother   . Mitral valve prolapse Mother   . Asthma Mother   . CAD Mother 35  . Anxiety disorder Mother   . Mitral valve prolapse Sister   . Asthma Sister   . Mitral valve prolapse Son   . Alcohol abuse Son   . Diabetes Maternal Uncle   . Diabetes Maternal Grandmother   . Mitral valve prolapse Sister   . CAD Son 43       No details available  . Alcohol abuse Father   . Alcohol abuse Brother     Social History   Socioeconomic History  . Marital status: Single    Spouse name: Not on file  . Number of children: 2  . Years of education: Not on file  . Highest education level: Not on file  Occupational History  . Not on file   Tobacco Use  . Smoking status: Current Every Day Smoker    Packs/day: 0.50    Years: 12.00    Pack years: 6.00    Types: Cigarettes  . Smokeless tobacco: Never Used  Substance and Sexual Activity  . Alcohol use: No    Alcohol/week: 0.0 standard drinks  . Drug use: No  . Sexual activity: Not Currently    Birth control/protection: None  Other Topics Concern  . Not on file  Social History Narrative   Lives alone.     Social Determinants of Health   Financial Resource Strain:   . Difficulty of Paying Living Expenses:   Food Insecurity:   . Worried About Programme researcher, broadcasting/film/video in the Last Year:   . Barista in the Last Year:   Transportation Needs:   . Freight forwarder (Medical):   Marland Kitchen Lack of Transportation (Non-Medical):   Physical Activity:   . Days of Exercise per Week:   . Minutes of Exercise per Session:   Stress:   . Feeling of Stress :   Social Connections:   . Frequency of Communication with Friends and Family:   . Frequency of Social Gatherings with Friends and Family:   . Attends Religious Services:   . Active Member of Clubs or Organizations:   . Attends Banker Meetings:   Marland Kitchen Marital Status:   Intimate Partner Violence:   . Fear of Current or Ex-Partner:   . Emotionally Abused:   Marland Kitchen Physically Abused:   . Sexually Abused:     Outpatient Medications Prior to Visit  Medication Sig Dispense Refill  . albuterol (PROVENTIL) (2.5 MG/3ML) 0.083% nebulizer solution Take 3 mLs (2.5 mg total) by nebulization every 6 (six) hours as needed for wheezing or shortness of breath. 150 mL 1  . albuterol (VENTOLIN HFA) 108 (90 Base) MCG/ACT inhaler Inhale 2 puffs into the lungs every 6 (six) hours as needed for wheezing or shortness of breath. 18 g 6  . cetirizine (ZYRTEC) 10 MG tablet TAKE 1 TABLET BY MOUTH EVERY DAY 30 tablet 1  . fluticasone (FLONASE) 50 MCG/ACT nasal spray SPRAY 2 SPRAYS INTO EACH NOSTRIL EVERY DAY 16 mL 1  . Fluticasone-Salmeterol  (ADVAIR) 250-50 MCG/DOSE AEPB Inhale 1 puff into the lungs 2 (two) times daily. 60 each 6  . gabapentin (NEURONTIN) 300 MG capsule Take 1 capsule (300 mg total) by mouth at bedtime. 30 capsule 6  . lidocaine (XYLOCAINE) 5 % ointment Apply  1 application topically as needed. 35.44 g 0  . metoprolol succinate (TOPROL-XL) 50 MG 24 hr tablet TAKE 1 TABLET BY MOUTH EVERY DAY IMMEDIATELY FOLLOW A MEAL 90 tablet 0  . metoprolol tartrate (LOPRESSOR) 25 MG tablet Take 0.5 tablets (12.5 mg total) by mouth as needed. For palpitations 30 tablet 3  . potassium chloride SA (K-DUR) 20 MEQ tablet Take 1 tablet (20 mEq total) by mouth daily. 30 tablet 3  . pseudoephedrine-acetaminophen (TYLENOL SINUS) 30-500 MG TABS tablet Take 1 tablet by mouth every 4 (four) hours as needed.    Marland Kitchen rOPINIRole (REQUIP XL) 2 MG 24 hr tablet Take 1 tablet (2 mg total) by mouth at bedtime. 30 tablet 6  . Vitamin D, Ergocalciferol, (DRISDOL) 50000 units CAPS capsule Take 1 capsule (50,000 Units total) by mouth every 7 (seven) days. 16 capsule 0  . methylPREDNISolone (MEDROL DOSEPAK) 4 MG TBPK tablet As instruced (Patient not taking: Reported on 02/12/2019) 21 tablet 0   Facility-Administered Medications Prior to Visit  Medication Dose Route Frequency Provider Last Rate Last Admin  . methylPREDNISolone acetate (DEPO-MEDROL) injection 80 mg  80 mg Other Once Tyrell Antonio, MD        Allergies  Allergen Reactions  . Ivp Dye [Iodinated Diagnostic Agents] Hives  . Levofloxacin Hives  . Sulfa Drugs Cross Reactors Hives  . Dilantin [Phenytoin Sodium Extended] Hives and Itching  . Aspirin Other (See Comments)    Upset stomach, resolves with GERD meds   . Caffeine Other (See Comments)    Heart races  . Cymbalta [Duloxetine Hcl] Diarrhea and Nausea Only    Dizzy  . Penicillins Rash    Pt called in stating she  Was prescribed Pen VK in ED and developed a generalized raised rash.  She has taken amoxicillin and augmentin since this time  and not had any problem  . Pregabalin Rash  . Sodium Nitrate Other (See Comments)    headaches    ROS Review of Systems  Constitutional: Positive for appetite change and fatigue.  Eyes: Negative for visual disturbance.  Respiratory: Negative for cough and shortness of breath.   Cardiovascular: Positive for palpitations. Negative for chest pain.  Gastrointestinal: Negative for constipation, diarrhea, nausea and vomiting.  Endocrine: Positive for cold intolerance. Negative for heat intolerance.  Musculoskeletal: Positive for arthralgias, back pain and neck pain.  Skin: Negative.   Neurological: Positive for numbness. Negative for dizziness, weakness and light-headedness.  Psychiatric/Behavioral: Positive for agitation and sleep disturbance. Negative for dysphoric mood. The patient is not nervous/anxious.       Objective:    Physical Exam  Constitutional: She is oriented to person, place, and time. She appears well-developed and well-nourished. No distress.  HENT:  Head: Normocephalic and atraumatic.  Eyes: Pupils are equal, round, and reactive to light. Conjunctivae are normal.  Neck: No JVD present. No thyromegaly present.  Pain with rotation of neck  Cardiovascular: Normal rate, regular rhythm and normal heart sounds. Exam reveals no gallop and no friction rub.  No murmur heard. Pulmonary/Chest: Effort normal and breath sounds normal. No respiratory distress. She has no wheezes.  Abdominal: Soft. Bowel sounds are normal. She exhibits no distension and no mass. There is no abdominal tenderness.  Musculoskeletal:     Cervical back: Normal range of motion and neck supple. Muscular tenderness present.     Right knee: Decreased range of motion.  Lymphadenopathy:    She has no cervical adenopathy.  Neurological: She is alert and oriented to person,  place, and time.  Skin: Skin is warm, dry and intact. She is not diaphoretic. No cyanosis.  Linear Ridging on nails  Psychiatric: She  has a normal mood and affect. Her behavior is normal. Judgment and thought content normal.    BP 93/64   Pulse 74   Ht 5\' 3"  (1.6 m)   Wt 201 lb 6.4 oz (91.4 kg)   SpO2 97%   BMI 35.68 kg/m  Wt Readings from Last 3 Encounters:  06/18/19 201 lb 6.4 oz (91.4 kg)  05/22/19 200 lb (90.7 kg)  08/10/18 209 lb (94.8 kg)     Health Maintenance Due  Topic Date Due  . MAMMOGRAM  Never done  . COLONOSCOPY  Never done  . PAP SMEAR-Modifier  08/01/2015    There are no preventive care reminders to display for this patient.  Lab Results  Component Value Date   TSH 0.008 (L) 05/25/2019   Lab Results  Component Value Date   WBC 8.7 06/25/2018   HGB 14.1 06/25/2018   HCT 42.0 06/25/2018   MCV 94.2 06/25/2018   PLT 172 06/25/2018   Lab Results  Component Value Date   NA 140 05/25/2019   K 4.3 05/25/2019   CO2 23 05/25/2019   GLUCOSE 83 05/25/2019   BUN 15 05/25/2019   CREATININE 0.73 05/25/2019   BILITOT 0.3 05/19/2017   ALKPHOS 75 05/19/2017   AST 19 05/19/2017   ALT 15 05/19/2017   PROT 6.4 05/19/2017   ALBUMIN 4.2 05/19/2017   CALCIUM 9.6 05/25/2019   ANIONGAP 14 06/25/2018   Lab Results  Component Value Date   CHOL 214 (H) 05/19/2017   Lab Results  Component Value Date   HDL 62 05/19/2017   Lab Results  Component Value Date   LDLCALC 132 (H) 05/19/2017   Lab Results  Component Value Date   TRIG 98 05/19/2017   Lab Results  Component Value Date   CHOLHDL 3.5 05/19/2017   Lab Results  Component Value Date   HGBA1C 5.1 12/01/2013      Assessment & Plan:   Problem List Items Addressed This Visit      Respiratory   Allergic rhinitis  Patient has tried other therapies as discussed HPI with minimal relief   Relevant Orders   Ambulatory referral to Allergy     Other   Palpitations  TSH is low. Hyperthyroidism is most likely cause of palpitations with normal EKG on 05/25/19 Re-check TSH and check free T4 and T3 levels.    Relevant Orders   T4,  free   TSH   T3, Free   Chronic pain syndrome  Patient is still experiencing daily pain and currently taking Methocarbamol/ Gabapentin. Continue current medications. We will refer to Physical Therapy and order a TENS machine in hope of pain improvement/relief. Also discussed to the use of Lidoderm Patches for relief, which she reports she has at home and can use PRN.   Relevant Orders   Ambulatory referral to Physical Therapy    Other Visit Diagnoses    Abnormal thyroid blood test    -  Primary  See # 2   Relevant Orders   T4, free   TSH   T3, Free   Brittle nails     Possible effect of abnormal metabolic condition. Discussed using OTC biotin for nail strengthening,       No orders of the defined types were placed in this encounter.   Follow-up: Return in about 1 month (around  07/19/2019) for complete physical exam.    Doylene CanardAriel J Harris, Medical Student   Evaluation and management procedures were performed by me with Medical Student in attendance, note written by Medical Student under my supervision and collaboration. I have reviewed the note and I agree with the management and plan.   Sylvia Hall had a suppressed TSH drawn by her Cardiologist.  I am sending off a thyroid panel today to further evaluate her palpitations and if she has a diagnosis of Hyperthroidism we will commence medications while pursuing additional work up. Her chronic pain has not responded to multiple pharmacotherapeutic options offered and we will try physical therapy at this time.  Hoy RegisterEnobong Arnet Hofferber, MD, FAAFP. [facility] [place], [place] [phone]   06/18/2019, 4:27 PM

## 2019-06-19 ENCOUNTER — Other Ambulatory Visit: Payer: Self-pay | Admitting: Family Medicine

## 2019-06-19 LAB — TSH: TSH: 0.012 u[IU]/mL — ABNORMAL LOW (ref 0.450–4.500)

## 2019-06-19 LAB — T4, FREE: Free T4: 1.34 ng/dL (ref 0.82–1.77)

## 2019-06-19 LAB — T3, FREE: T3, Free: 4 pg/mL (ref 2.0–4.4)

## 2019-06-19 MED ORDER — METHIMAZOLE 5 MG PO TABS: 5.0000 mg | ORAL_TABLET | Freq: Two times a day (BID) | ORAL | 3 refills | Status: DC

## 2019-06-26 ENCOUNTER — Ambulatory Visit: Payer: Medicare Other | Admitting: Adult Health

## 2019-06-26 ENCOUNTER — Telehealth: Payer: Self-pay | Admitting: Cardiology

## 2019-06-26 NOTE — Telephone Encounter (Signed)
New message  Patient returning call for monitor results. Please give patient a call back.

## 2019-06-27 NOTE — Progress Notes (Signed)
Virtual Visit via Telephone Note   This visit type was conducted due to national recommendations for restrictions regarding the COVID-19 Pandemic (e.g. social distancing) in an effort to limit this patient's exposure and mitigate transmission in our community.  Due to her co-morbid illnesses, this patient is at least at moderate risk for complications without adequate follow up.  This format is felt to be most appropriate for this patient at this time.  The patient did not have access to video technology/had technical difficulties with video requiring transitioning to audio format only (telephone).  All issues noted in this document were discussed and addressed.  No physical exam could be performed with this format.  Please refer to the patient's chart for her  consent to telehealth for [facility].   Date:  06/28/2019   ID:  Sylvia Hall, DOB 1960-02-27, MRN [id number]  Patient Location: Home Provider Location: Home  PCP:  Hoy Register, MD  Cardiologist:  Rollene Rotunda, MD  Electrophysiologist:  None   Evaluation Performed:  Follow-Up Visit  Chief Complaint:  F/U  History of Present Illness:    Sylvia Hall is a 59 y.o. female who presents for ongoing assessment and management of mitral valve prolapse and palpitations.  When last seen by Dr. Antoine Poche on 05/22/2019 she complained of palpitations usually after she ate.  She had been taking extra 25 mg of metoprolol during that time but ran out of the immediate release.  Her palpitations are "forceful" with some heart racing on occasion.  She does feel its periodically while she was lying down at night.    She was without complaints of chest pain or shortness of breath associated with it.  No new testing was completed during that office visit however if she had worsening symptoms Dr. Antoine Poche have a low threshold for repeat imaging.  She was given a prescription for immediate release metoprolol to take as needed.  He did order a  2-week cardiac event monitor.  This was completed on 06/21/2019 which revealed occasional SVT brief runs.  She was to continue to take short acting metoprolol as needed for symptoms.  Of note she did have TSH completed which revealed a TSH low at 0.012 indicative of hyperthyroidism.  Free T3, and T4 was normal.  She has had appt with Dr. Alvis Lemmings who called in Tapazole due to abnormal labs. She has not heard from then about this and was unaware of the Rx. She is to see them on follow up on June 30th.  I have talked with her about this and reviewed the chart with her. She is to call PCP office to verify this.    The patient does not have symptoms concerning for COVID-19 infection (fever, chills, cough, or new shortness of breath). She asks if she can take her COVID shot with all that's going on with her health.   Past Medical History:  Diagnosis Date  . Anxiety   . Arthritis   . Asthma   . Back pain, chronic   . Bilateral carpal tunnel syndrome   . Depression   . DVT (deep vein thrombosis) in pregnancy   . Environmental allergies   . Epilepsy (HCC)   . Fatigue   . GERD (gastroesophageal reflux disease)   . Headache    sinus  . History of kidney stones   . Mitral valve prolapse   . Pericarditis   . PTSD (post-traumatic stress disorder)   . Stenosis of lumbosacral spine  numbness L hand and L leg  . Thrombophlebitis    Age 34   Past Surgical History:  Procedure Laterality Date  . ABDOMINAL HYSTERECTOMY     partial  . APPENDECTOMY    . BIOPSY BREAST     Right breast x 10  . BREAST LUMPECTOMY     Right breast  . CARPAL TUNNEL RELEASE Left 11/10/2017   Procedure: LEFT CARPAL TUNNEL RELEASE;  Surgeon: Cammy Copa, MD;  Location: [facility] OR;  Service: Orthopedics;  Laterality: Left;  . CESAREAN SECTION     x 2  . NOSE SURGERY     s/p trauma  . OVARY SURGERY    . RHINOPLASTY    . TUBAL LIGATION     left tubal  . UTERINE FIBROID SURGERY       Current Meds  Medication  Sig  . albuterol (PROVENTIL) (2.5 MG/3ML) 0.083% nebulizer solution Take 3 mLs (2.5 mg total) by nebulization every 6 (six) hours as needed for wheezing or shortness of breath.  Marland Kitchen albuterol (VENTOLIN HFA) 108 (90 Base) MCG/ACT inhaler Inhale 2 puffs into the lungs every 6 (six) hours as needed for wheezing or shortness of breath.  . cetirizine (ZYRTEC) 10 MG tablet TAKE 1 TABLET BY MOUTH EVERY DAY  . fluticasone (FLONASE) 50 MCG/ACT nasal spray SPRAY 2 SPRAYS INTO EACH NOSTRIL EVERY DAY  . Fluticasone-Salmeterol (ADVAIR) 250-50 MCG/DOSE AEPB Inhale 1 puff into the lungs 2 (two) times daily.  Marland Kitchen gabapentin (NEURONTIN) 300 MG capsule Take 1 capsule (300 mg total) by mouth at bedtime.  . metoprolol succinate (TOPROL-XL) 50 MG 24 hr tablet TAKE 1 TABLET BY MOUTH EVERY DAY IMMEDIATELY FOLLOW A MEAL  . metoprolol tartrate (LOPRESSOR) 25 MG tablet Take 0.5 tablets (12.5 mg total) by mouth as needed. For palpitations  . pseudoephedrine-acetaminophen (TYLENOL SINUS) 30-500 MG TABS tablet Take 1 tablet by mouth every 4 (four) hours as needed.  Marland Kitchen rOPINIRole (REQUIP XL) 2 MG 24 hr tablet Take 1 tablet (2 mg total) by mouth at bedtime.  . [DISCONTINUED] potassium chloride SA (K-DUR) 20 MEQ tablet Take 1 tablet (20 mEq total) by mouth daily.   Current Facility-Administered Medications for the 06/28/19 encounter (Telemedicine) with Jodelle Gross, NP  Medication  . methylPREDNISolone acetate (DEPO-MEDROL) injection 80 mg     Allergies:   Ivp dye [iodinated diagnostic agents], Levofloxacin, Sulfa drugs cross reactors, Dilantin [phenytoin sodium extended], Aspirin, Caffeine, Cymbalta [duloxetine hcl], Penicillins, Pregabalin, and Sodium nitrate   Social History   Tobacco Use  . Smoking status: Current Every Day Smoker    Packs/day: 0.50    Years: 12.00    Pack years: 6.00    Types: Cigarettes  . Smokeless tobacco: Never Used  Substance Use Topics  . Alcohol use: No    Alcohol/week: 0.0 standard  drinks  . Drug use: No     Family Hx: The patient's family history includes Alcohol abuse in her brother, father, and son; Anxiety disorder in her mother; Asthma in her mother and sister; CAD (age of onset: 57) in her son; CAD (age of onset: 52) in her mother; Diabetes in her maternal grandmother, maternal uncle, and mother; Mitral valve prolapse in her mother, sister, sister, and son.  ROS:   Please see the history of present illness.    All other systems reviewed and are negative.   Prior CV studies:   The following studies were reviewed today: Cardiac monitor results 06/21/2019  NSR NSVT 13 beats SVT longest run  10 beats     Labs/Other Tests and Data Reviewed:    EKG:  No ECG reviewed.  Recent Labs: 05/25/2019: BUN 15; Creatinine, Ser 0.73; Magnesium 1.8; Potassium 4.3; Sodium 140 06/18/2019: TSH 0.012   Recent Lipid Panel Lab Results  Component Value Date/Time   CHOL 214 (H) 05/19/2017 10:21 AM   TRIG 98 05/19/2017 10:21 AM   HDL 62 05/19/2017 10:21 AM   CHOLHDL 3.5 05/19/2017 10:21 AM   CHOLHDL 3.4 12/01/2013 04:15 PM   LDLCALC 132 (H) 05/19/2017 10:21 AM    Wt Readings from Last 3 Encounters:  06/28/19 196 lb (88.9 kg)  06/18/19 201 lb 6.4 oz (91.4 kg)  05/22/19 200 lb (90.7 kg)     Objective:    Vital Signs:  Ht 5\' 3"  (1.6 m)   Wt 196 lb (88.9 kg)   BMI 34.72 kg/m    VITAL SIGNS:  reviewed GEN:  no acute distress RESPIRATORY:  normal respiratory effort, symmetric expansion NEURO:  alert and oriented x 3, no obvious focal deficit PSYCH:  normal affect  ASSESSMENT & PLAN:    1. Palpitations: Seen on cardiac monitor. Uses metoprolol prn.   2. Hyperthyroidism: Followed by Dr.Newlin. She prescribed Tapazole 5 mg BID.  The patient was unaware of this. She is calling the office to verify and request need for endocrinologist referral and or further testing with thyroid ultrasound  Defer to PCP.  3. Chronic Respiratory allergies.She does take some tylenol  with pseudoephedrine prn. I have advised again using this medication to avoid worsening symptoms.  COVID-19 Education: The signs and symptoms of COVID-19 were discussed with the patient and how to seek care for testing (follow up with PCP or arrange E-visit). The importance of social distancing was discussed today. She is advised to go ahead and get the COVID vaccine.   Time:   Today, I have spent 30 minutes with the patient with telehealth technology discussing the above problems.     Medication Adjustments/Labs and Tests Ordered: Current medicines are reviewed at length with the patient today.  Concerns regarding medicines are outlined above.   Tests Ordered: No orders of the defined types were placed in this encounter.   Medication Changes: No orders of the defined types were placed in this encounter.   Disposition:  Follow up 3 months with Dr. Percival Spanish.   Signed, Phill Myron. West Pugh, ANP, AACC  06/28/2019 10:42 AM    [facility]

## 2019-06-28 ENCOUNTER — Encounter: Payer: Self-pay | Admitting: Adult Health

## 2019-06-28 ENCOUNTER — Telehealth: Payer: Self-pay

## 2019-06-28 ENCOUNTER — Telehealth (INDEPENDENT_AMBULATORY_CARE_PROVIDER_SITE_OTHER): Payer: Medicare Other | Admitting: Adult Health

## 2019-06-28 VITALS — Ht 63.0 in | Wt 196.0 lb

## 2019-06-28 DIAGNOSIS — Z7189 Other specified counseling: Secondary | ICD-10-CM

## 2019-06-28 DIAGNOSIS — R002 Palpitations: Secondary | ICD-10-CM

## 2019-06-28 DIAGNOSIS — I10 Essential (primary) hypertension: Secondary | ICD-10-CM

## 2019-06-28 DIAGNOSIS — E0590 Thyrotoxicosis, unspecified without thyrotoxic crisis or storm: Secondary | ICD-10-CM

## 2019-06-28 MED ORDER — PREDNISONE 10 MG PO TABS
10.0000 mg | ORAL_TABLET | Freq: Every day | ORAL | 0 refills | Status: DC
Start: 2019-06-28 — End: 2019-07-10

## 2019-06-28 NOTE — Telephone Encounter (Signed)
Patient was called and a voicemail was left informing patient to return phone call. 

## 2019-06-28 NOTE — Telephone Encounter (Signed)
Patient is requesting a referral to Endocrin for her Thyroid. Patient is also requesting tyroid ultrasound.  Patient states that she is having a sinus infection and would like some medication.

## 2019-06-28 NOTE — Patient Instructions (Signed)
Medication Instructions:  Continue current medications  *If you need a refill on your cardiac medications before your next appointment, please call your pharmacy*   Lab Work: None Ordered   Testing/Procedures: None Ordered   Follow-Up: At [facility], you and your health needs are our priority.  As part of our continuing mission to provide you with exceptional heart care, we have created designated Provider Care Teams.  These Care Teams include your primary Cardiologist (physician) and Advanced Practice Providers (APPs -  Physician Assistants and Nurse Practitioners) who all work together to provide you with the care you need, when you need it.  We recommend signing up for the patient portal called "MyChart".  Sign up information is provided on this After Visit Summary.  MyChart is used to connect with patients for Virtual Visits (Telemedicine).  Patients are able to view lab/test results, encounter notes, upcoming appointments, etc.  Non-urgent messages can be sent to your provider as well.   To learn more about what you can do with MyChart, go to [email or web address].    Your next appointment:   Friday August 20th @ 12:20 pm  The format for your next appointment:   In Person  Provider:   Rollene Rotunda, MD

## 2019-06-28 NOTE — Telephone Encounter (Signed)
Spoke with patient. Results reviewed. Patient verbalized understanding.

## 2019-06-28 NOTE — Telephone Encounter (Signed)
Referred to Endocrine for management of her Hyperthyroidism and they will work her up accordingly. Script for Prednisone sent to her Pharmacy.

## 2019-06-28 NOTE — Telephone Encounter (Addendum)
Ultrasound thyroid & referral to endocrinologist for thyroid specialist. Please call pt advises. Three days symptoms sinuses packed & bad headached & history chronic sinusitis per pt. Please call med to pharmacy.

## 2019-06-29 ENCOUNTER — Other Ambulatory Visit: Payer: Self-pay | Admitting: Family Medicine

## 2019-06-29 DIAGNOSIS — E0590 Thyrotoxicosis, unspecified without thyrotoxic crisis or storm: Secondary | ICD-10-CM

## 2019-07-03 NOTE — Telephone Encounter (Signed)
Pt stopped taking methimazole. Pt has not taken it today due to dizziness and headache. Also informed pt of referral to [facility]. Pt verbalized understanding allow time for Endo to call her for appt.

## 2019-07-06 ENCOUNTER — Encounter: Payer: Self-pay | Admitting: Physical Therapy

## 2019-07-06 ENCOUNTER — Ambulatory Visit: Payer: Medicare Other | Attending: Family Medicine | Admitting: Physical Therapy

## 2019-07-06 ENCOUNTER — Other Ambulatory Visit: Payer: Self-pay

## 2019-07-06 DIAGNOSIS — G8929 Other chronic pain: Secondary | ICD-10-CM | POA: Diagnosis present

## 2019-07-06 DIAGNOSIS — R293 Abnormal posture: Secondary | ICD-10-CM | POA: Diagnosis present

## 2019-07-06 DIAGNOSIS — M542 Cervicalgia: Secondary | ICD-10-CM | POA: Diagnosis present

## 2019-07-06 DIAGNOSIS — M6281 Muscle weakness (generalized): Secondary | ICD-10-CM | POA: Insufficient documentation

## 2019-07-06 DIAGNOSIS — M545 Low back pain: Secondary | ICD-10-CM | POA: Diagnosis present

## 2019-07-06 NOTE — Therapy (Signed)
[facility] [address] [place], [place], [address] Phone: [phone]   Fax:  [phone]  Physical Therapy Evaluation  Patient Details  Name: Sylvia Hall MRN: [id number] Date of Birth: 1960/04/09 Referring Provider (PT): Hoy Register, MD   Encounter Date: 07/06/2019  PT End of Session - 07/06/19 1122    Visit Number  1    Number of Visits  8    Date for PT Re-Evaluation  08/31/19    Authorization Type  MCR/MCD    Progress Note Due on Visit  10    PT Start Time  1125    PT Stop Time  1210    PT Time Calculation (min)  45 min    Activity Tolerance  Patient tolerated treatment well    Behavior During Therapy  [facility] for tasks assessed/performed       Past Medical History:  Diagnosis Date  . Anxiety   . Arthritis   . Asthma   . Back pain, chronic   . Bilateral carpal tunnel syndrome   . Depression   . DVT (deep vein thrombosis) in pregnancy   . Environmental allergies   . Epilepsy (HCC)   . Fatigue   . GERD (gastroesophageal reflux disease)   . Headache    sinus  . History of kidney stones   . Mitral valve prolapse   . Pericarditis   . PTSD (post-traumatic stress disorder)   . Stenosis of lumbosacral spine    numbness L hand and L leg  . Thrombophlebitis    Age 59    Past Surgical History:  Procedure Laterality Date  . ABDOMINAL HYSTERECTOMY     partial  . APPENDECTOMY    . BIOPSY BREAST     Right breast x 10  . BREAST LUMPECTOMY     Right breast  . CARPAL TUNNEL RELEASE Left 11/10/2017   Procedure: LEFT CARPAL TUNNEL RELEASE;  Surgeon: Cammy Copa, MD;  Location: [facility] OR;  Service: Orthopedics;  Laterality: Left;  . CESAREAN SECTION     x 2  . NOSE SURGERY     s/p trauma  . OVARY SURGERY    . RHINOPLASTY    . TUBAL LIGATION     left tubal  . UTERINE FIBROID SURGERY      There were no vitals filed for this visit.   Subjective Assessment - 07/06/19 1127    Subjective  Patient  reports back and neck pain mainly, and general joint pain and numbness in hands and fingers. She also has right knee and left ankle pain that she is having an MRI for next month. She notes just a lot of pain. She has been having the neck and back pain for a number of years now that has just gotten worse. Neck pain is constant and has severely limited her doing any driving, dressing/bathing, reaching overhead, any bending.    Pertinent History  Patient reports thryroid condition that could be contributing to her joint pain    Limitations  Sitting;Lifting;Standing;Walking;House hold activities    Currently in Pain?  Yes    Pain Score  0-No pain    Pain Location  Back    Pain Orientation  Lower;Right    Pain Type  Chronic pain    Pain Radiating Towards  patient reports feels like a fire on front of right thigh    Pain Onset  More than a month ago    Pain Frequency  Constant  Effect of Pain on Daily Activities  Patient limited in all activities    Multiple Pain Sites  Yes    Pain Score  7    Pain Location  Neck    Pain Orientation  Left    Pain Descriptors / Indicators  Tightness;Sharp;Aching   "locks up"   Pain Type  Chronic pain    Pain Radiating Towards  down back of left arm, upper back and between shoulder blades    Pain Onset  More than a month ago    Pain Frequency  Constant    Aggravating Factors   Turning head to the left    Pain Relieving Factors  Medication    Effect of Pain on Daily Activities  Patient limited in all activities         [facility] PT Assessment - 07/06/19 0001      Assessment   Medical Diagnosis  Chronic pain syndrome    Referring Provider (PT)  Hoy RegisterNewlin, Enobong, MD    Onset Date/Surgical Date  --   patient reports years of pain   Hand Dominance  Right    Next MD Visit  08/08/2019    Prior Therapy  None      Precautions   Precautions  None      Restrictions   Weight Bearing Restrictions  No      Balance Screen   Has the patient fallen in the past 6  months  Yes    How many times?  Larey SeatFell last week due to right knee    Has the patient had a decrease in activity level because of a fear of falling?   No    Is the patient reluctant to leave their home because of a fear of falling?   No      Home Public house managernvironment   Living Environment  Private residence    Living Arrangements  Spouse/significant other    Type of Home  House    Home Access  Ramped entrance    Home Layout  One level      Prior Function   Level of Independence  Independent    Vocation  On disability    Leisure  None reported, used to like doing crafts and gardening      Cognition   Overall Cognitive Status  Within Functional Limits for tasks assessed      Observation/Other Assessments   Observations  Patient appears in no apparent distress, wearing hinged knee brace on right knee    Focus on Therapeutic Outcomes (FOTO)   NA      Sensation   Light Touch  Impaired by gross assessment    Additional Comments  Bilateral finger numbness (bilateral carpal tunnel), proximal anterior aspect of right thigh      Posture/Postural Control   Posture Comments  Patient exhibits rounded and shrugged shoulders, forward head, increased thoracic kyphosis      ROM / Strength   AROM / PROM / Strength  AROM;PROM;Strength      AROM   AROM Assessment Site  Cervical;Shoulder    Right/Left Shoulder  Left    Left Shoulder Flexion  100 Degrees    Cervical Flexion  30   pain shooting down middle of neck/upper back   Cervical Extension  25   feels stiff   Cervical - Right Side Bend  35   left sided neck pain   Cervical - Left Side Bend  15   left sided neck pain  Cervical - Right Rotation  35   feels stiff   Cervical - Left Rotation  15   shooting pain left neck and shoulder     PROM   Overall PROM Comments  Not assessed due to increased pain      Strength   Overall Strength Comments  Unable to assess due to pain      Palpation   Palpation comment  TTP left upper trap with shooting  pain down back of left arm, bilateral thoracic paraspinals      Transfers   Transfers  Independent with all Transfers                  Objective measurements completed on examination: See above findings.      [facility] Adult PT Treatment/Exercise - 07/06/19 0001      Exercises   Exercises  Neck      Neck Exercises: Seated   Neck Retraction  10 reps   3 sec hold   Other Seated Exercise  Shoulder blade squeezes x10 with 3 sec hold      Neck Exercises: Stretches   Upper Trapezius Stretch  2 reps;20 seconds    Levator Stretch  2 reps;20 seconds    Other Neck Stretches  Table slide shoulder flexion x10 with 10 sec hold             PT Education - 07/06/19 1119    Education Details  Exam findings, POC, HEP    Person(s) Educated  Patient    Methods  Explanation;Demonstration;Tactile cues;Verbal cues;Handout    Comprehension  Verbalized understanding;Returned demonstration;Verbal cues required;Tactile cues required;Need further instruction       PT Short Term Goals - 07/06/19 1242      PT SHORT TERM GOAL #1   Title  Patient will be I with initial HEP to progress with PT    Time  4    Period  Weeks    Status  New    Target Date  08/03/19      PT SHORT TERM GOAL #2   Title  Patient will exhibit improve left cervical rotation to 25 deg to improve driving ability    Time  4    Period  Weeks    Status  New    Target Date  08/03/19      PT SHORT TERM GOAL #3   Title  Patient will exhibit improved seated posture with reduced shrugged and rounded shoulders    Time  4    Period  Weeks    Status  New    Target Date  08/03/19        PT Long Term Goals - 07/06/19 1243      PT LONG TERM GOAL #1   Title  Patient will be I with final HEP to maintain progress from PT    Time  8    Period  Weeks    Status  New    Target Date  08/31/19      PT LONG TERM GOAL #2   Title  Patient will exhibit improved cervical rotation to >/= 40 deg bilaterally to reduce  tightness and improve ability to drive    Time  8    Period  Weeks    Status  New    Target Date  08/31/19      PT LONG TERM GOAL #3   Title  Patient will report </= 4/10 neck and upper back pain with activity to  improve functional ability    Time  8    Period  Weeks    Status  New    Target Date  08/31/19      PT LONG TERM GOAL #4   Title  Establish more LTG with further assessment             Plan - 07/06/19 1229    Clinical Impression Statement  Patient presents to PT with chronic pain of multiple body parts. This visit focused mainly on neck pain as patient rated this as worse. She exhibits poor posture with increased muscular tightness, limitation with cervical and shoulder motion. Her pain does seem to have a large muscular component but she does demonstrate some radicular symptoms. She was provided exercises to work on posture and stretching neck musculature. She would benefit from continued skilled PT to improve neck motion and reduce pain in order to maximize functional level.    Personal Factors and Comorbidities  Fitness;Past/Current Experience;Social Background;Time since onset of injury/illness/exacerbation    Examination-Activity Limitations  Locomotion Level;Reach Overhead;Bend;Sit;Stand;Lift;Dressing;Hygiene/Grooming;Carry    Examination-Participation Restrictions  Meal Prep;Cleaning;Community Activity;Shop;Driving;Laundry;Yard Work    Conservation officer, historic buildings  Evolving/Moderate complexity    Clinical Decision Making  Moderate    Rehab Potential  Good    PT Frequency  2x / week    PT Duration  8 weeks    PT Treatment/Interventions  ADLs/Self Care Home Management;Cryotherapy;Electrical Stimulation;Moist Heat;Gait training;Stair training;Functional mobility training;Therapeutic activities;Therapeutic exercise;Balance training;Neuromuscular re-education;Manual techniques;Patient/family education;Passive range of motion;Dry needling;Joint Manipulations;Spinal  Manipulations;Taping    PT Next Visit Plan  Assess HEP and progress PRN, manual and/or modalities for upper trap and neck, postural control and stretching, assess lower back pain    PT Home Exercise Plan  [email or web address]: upper trap and levator stretch, table slide shoulder flexion, chin tuck    Consulted and Agree with Plan of Care  Patient       Patient will benefit from skilled therapeutic intervention in order to improve the following deficits and impairments:  Decreased range of motion, Decreased activity tolerance, Pain, Increased muscle spasms, Impaired flexibility, Postural dysfunction, Decreased strength, Impaired sensation  Visit Diagnosis: Cervicalgia  Chronic bilateral low back pain, unspecified whether sciatica present  Muscle weakness (generalized)  Abnormal posture     Problem List Patient Active Problem List   Diagnosis Date Noted  . Educated about COVID-19 virus infection 03/31/2019  . Tobacco abuse 08/10/2018  . Obesity (BMI 35.0-39.9 without comorbidity) 08/01/2018  . Myalgia 07/13/2018  . Chronic pain syndrome 10/07/2017  . Degenerative disc disease, lumbar 08/17/2017  . Hyperlipidemia 08/17/2017  . Left sided numbness 12/01/2013  . Acute sinusitis, unspecified 12/08/2012  . Muscle spasm of right leg 12/08/2012  . Acute upper respiratory infections of unspecified site 12/08/2012  . Severe episode of recurrent major depressive disorder (HCC) 12/07/2012  . Borderline personality disorder (HCC) 10/19/2012  . Acute posttraumatic stress disorder 08/24/2012  . Palpitations 08/16/2012  . Neuropathy 08/16/2012  . Restless leg syndrome 08/16/2012  . Routine gynecological examination 07/31/2012  . Neurogenic pain 04/12/2012  . Anxiety 11/03/2011  . Vitamin D deficiency 08/08/2011  . Chronic back pain 05/29/2011  . MVP (mitral valve prolapse) 05/29/2011  . Asthma 05/29/2011  . Tobacco user 05/29/2011  . Chronic mastitis 05/29/2011  . Allergic rhinitis 05/29/2011     Rosana Hoes, PT, DPT, LAT, ATC 07/06/19  12:53 PM Phone: [phone] Fax: [phone]   [facility] [address] [place], [place], [address] Phone: [phone]  Fax:  [phone]  Name: Sylvia Hall MRN: [id number] Date of Birth: [birth year]

## 2019-07-06 NOTE — Patient Instructions (Signed)
Access Code: [email or web address] URL: [email or web address] Date: 07/06/2019 Prepared by: Rosana Hoes  Exercises Gentle Upper Trap Stretch - 3 x daily - 7 x weekly - 10 reps - 20 seconds hold Gentle Levator Scapulae Stretch - 3 x daily - 7 x weekly - 2 reps - 20 seconds hold Seated Scapular Retraction - 3 x daily - 7 x weekly - 10 reps - 3 seconds hold Seated Shoulder Flexion Towel Slide at Table Top - 3 x daily - 7 x weekly - 10 reps - 10 seconds hold Seated Cervical Retraction - 3 x daily - 7 x weekly - 10 reps - 3 seconds hold

## 2019-07-10 ENCOUNTER — Other Ambulatory Visit: Payer: Self-pay

## 2019-07-10 ENCOUNTER — Ambulatory Visit (INDEPENDENT_AMBULATORY_CARE_PROVIDER_SITE_OTHER): Payer: Medicare Other | Admitting: Internal Medicine

## 2019-07-10 ENCOUNTER — Encounter: Payer: Self-pay | Admitting: Internal Medicine

## 2019-07-10 VITALS — BP 126/80 | HR 86 | Temp 98.6°F | Ht 63.0 in | Wt 199.4 lb

## 2019-07-10 DIAGNOSIS — E0590 Thyrotoxicosis, unspecified without thyrotoxic crisis or storm: Secondary | ICD-10-CM | POA: Diagnosis not present

## 2019-07-10 NOTE — Patient Instructions (Signed)
Thyroid Uptake and Scan works like this: We would first check a thyroid "scan" (a special, but easy and painless type of thyroid x ray).  you go to the x-ray department of the hospital to swallow a pill, which contains a miniscule amount of radiation.  You will not notice any symptoms from this.  You will go back to the x-ray department the next day, to lie down in front of a camera.  The results of this will be sent to me.    

## 2019-07-10 NOTE — Progress Notes (Signed)
Name: Sylvia Hall  MRN/ DOB: [id number], 1960-11-10    Age/ Sex: 59 y.o., female    PCP: Charlott Rakes, MD   Reason for Endocrinology Evaluation: Subclinical hyperthyroidism     Date of Initial Endocrinology Evaluation: 07/10/2019     HPI: [name] is a 59 y.o. female with a past medical history of Asthma, hyperlipidemia, MVP and RLS. The patient presented for initial endocrinology clinic visit on 07/10/2019 for consultative assistance with her subclinical hyperthyroidism.   Pt has been noted to have a suppressed TSH 05/2019 at 0.008 uIU/mL , during evaluation for palpitations.   Her metoprolol was adjusted   Repeat labs in 06/2019 confirmed  Suppressed TSH at 0.012 uIU/mL with normal FT4 and T3 and was started on Methimazole at the time.  But she stopped it due to near syncope due to severe dizziness.  But no GI symptoms     She is c/o arthralgias and right thigh numbness but denies weight loss, has noted irritability    Other than her mitral valve prolapse she has no diagnosis of arrhythmia , but no osteoporosis , has hx of ankle and finger fracture while playing sports but nothing in the recent years.   Denies local neck symptoms No biotin recently.    Mother with thyroid disease ( hyperthyroidism)    HISTORY:  Past Medical History:  Past Medical History:  Diagnosis Date  . Anxiety   . Arthritis   . Asthma   . Back pain, chronic   . Bilateral carpal tunnel syndrome   . Depression   . DVT (deep vein thrombosis) in pregnancy   . Environmental allergies   . Epilepsy ([place])   . Fatigue   . GERD (gastroesophageal reflux disease)   . Headache    sinus  . History of kidney stones   . Mitral valve prolapse   . Pericarditis   . PTSD (post-traumatic stress disorder)   . Stenosis of lumbosacral spine    numbness L hand and L leg  . Thrombophlebitis    Age 70   Past Surgical History:  Past Surgical History:  Procedure Laterality Date  . ABDOMINAL  HYSTERECTOMY     partial  . APPENDECTOMY    . BIOPSY BREAST     Right breast x 10  . BREAST LUMPECTOMY     Right breast  . CARPAL TUNNEL RELEASE Left 11/10/2017   Procedure: LEFT CARPAL TUNNEL RELEASE;  Surgeon: Meredith Pel, MD;  Location: [facility];  Service: Orthopedics;  Laterality: Left;  . CESAREAN SECTION     x 2  . NOSE SURGERY     s/p trauma  . OVARY SURGERY    . RHINOPLASTY    . TUBAL LIGATION     left tubal  . UTERINE FIBROID SURGERY        Social History:  reports that she has been smoking cigarettes. She has a 6.00 pack-year smoking history. She has never used smokeless tobacco. She reports that she does not drink alcohol or use drugs.  Family History: family history includes Alcohol abuse in her brother, father, and son; Anxiety disorder in her mother; Asthma in her mother and sister; CAD (age of onset: 54) in her son; CAD (age of onset: 28) in her mother; Diabetes in her maternal grandmother, maternal uncle, and mother; Mitral valve prolapse in her mother, sister, sister, and son.   HOME MEDICATIONS: Allergies as of 07/10/2019      Reactions  Ivp Dye [iodinated Diagnostic Agents] Hives   Levofloxacin Hives   Sulfa Drugs Cross Reactors Hives   Dilantin [phenytoin Sodium Extended] Hives, Itching   Methimazole Other (See Comments)   dizziness   Aspirin Other (See Comments)   Upset stomach, resolves with GERD meds   Caffeine Other (See Comments)   Heart races   Cymbalta [duloxetine Hcl] Diarrhea, Nausea Only   Dizzy   Penicillins Rash   Pt called in stating she  Was prescribed Pen VK in ED and developed a generalized raised rash.  She has taken amoxicillin and augmentin since this time and not had any problem   Pregabalin Rash   Sodium Nitrate Other (See Comments)   headaches      Medication List       Accurate as of July 10, 2019  4:25 PM. If you have any questions, ask your nurse or doctor.        albuterol (2.5 MG/3ML) 0.083% nebulizer  solution Commonly known as: PROVENTIL Take 3 mLs (2.5 mg total) by nebulization every 6 (six) hours as needed for wheezing or shortness of breath.   albuterol 108 (90 Base) MCG/ACT inhaler Commonly known as: VENTOLIN HFA Inhale 2 puffs into the lungs every 6 (six) hours as needed for wheezing or shortness of breath.   cetirizine 10 MG tablet Commonly known as: ZYRTEC TAKE 1 TABLET BY MOUTH EVERY DAY   fluticasone 50 MCG/ACT nasal spray Commonly known as: FLONASE SPRAY 2 SPRAYS INTO EACH NOSTRIL EVERY DAY   Fluticasone-Salmeterol 250-50 MCG/DOSE Aepb Commonly known as: ADVAIR Inhale 1 puff into the lungs 2 (two) times daily.   gabapentin 300 MG capsule Commonly known as: NEURONTIN Take 1 capsule (300 mg total) by mouth at bedtime.   lidocaine 5 % ointment Commonly known as: XYLOCAINE Apply 1 application topically as needed.   methimazole 5 MG tablet Commonly known as: Tapazole Take 1 tablet (5 mg total) by mouth 2 (two) times daily.   methocarbamol 500 MG tablet Commonly known as: ROBAXIN Take 500 mg by mouth every 8 (eight) hours as needed for muscle spasms.   metoprolol succinate 50 MG 24 hr tablet Commonly known as: TOPROL-XL TAKE 1 TABLET BY MOUTH EVERY DAY IMMEDIATELY FOLLOW A MEAL   metoprolol tartrate 25 MG tablet Commonly known as: LOPRESSOR Take 0.5 tablets (12.5 mg total) by mouth as needed. For palpitations   predniSONE 10 MG tablet Commonly known as: DELTASONE Take 1 tablet (10 mg total) by mouth daily with breakfast.   pseudoephedrine-acetaminophen 30-500 MG Tabs tablet Commonly known as: TYLENOL SINUS Take 1 tablet by mouth every 4 (four) hours as needed.   rOPINIRole 2 MG 24 hr tablet Commonly known as: REQUIP XL Take 1 tablet (2 mg total) by mouth at bedtime.         REVIEW OF SYSTEMS: A comprehensive ROS was conducted with the patient and is negative except as per HPI    OBJECTIVE:  VS: BP 126/80 (BP Location: Left Arm, Patient  Position: Sitting, Cuff Size: Normal)   Pulse 86   Temp 98.6 F (37 C)   Ht 5\' 3"  (1.6 m)   Wt 199 lb 6.4 oz (90.4 kg)   SpO2 96%   BMI 35.32 kg/m    Wt Readings from Last 3 Encounters:  07/10/19 199 lb 6.4 oz (90.4 kg)  06/28/19 196 lb (88.9 kg)  06/18/19 201 lb 6.4 oz (91.4 kg)     EXAM: General: Pt appears well and is in NAD  Eyes: External eye exam normal without stare, lid lag or exophthalmos.  EOM intact.  Neck: General: Supple without adenopathy. Thyroid: Thyroid size normal.  No goiter or nodules appreciated. No thyroid bruit.  Lungs: Clear with good BS bilat with no rales, rhonchi, or wheezes  Heart: Auscultation: RRR.  Abdomen: Normoactive bowel sounds, soft, nontender, without masses or organomegaly palpable  Extremities:  BL LE: No pretibial edema normal ROM and strength.  Neuro: Cranial nerves: II - XII grossly intact  Motor: Normal strength throughout DTRs: 2+ and symmetric in UE without delay in relaxation phase  Mental Status: Judgment, insight: Intact Orientation: Oriented to time, place, and person Mood and affect: No depression, anxiety, or agitation     DATA REVIEWED:   Results for Sylvia Hall, Sylvia Hall (MRN [id number]) as of 07/10/2019 16:25  Ref. Range 05/25/2019 11:26 06/18/2019 10:45  TSH Latest Ref Range: 0.450 - 4.500 uIU/mL 0.008 (L) 0.012 (L)  Triiodothyronine,Free,Serum Latest Ref Range: 2.0 - 4.4 pg/mL  4.0  T4,Free(Direct) Latest Ref Range: 0.82 - 1.77 ng/dL  7.34    ASSESSMENT/PLAN/RECOMMENDATIONS:   1. Subclinical Hyperthyroidism:  - Pt with some  symptoms that could be attributed to her thyroid  - No local neck symptoms  - The causes of subclinical hyperthyroidism are the same as the causes of overt hyperthyroidism, and like overt hyperthyroidism, subclinical hyperthyroidism can be persistent or transient. Common causes of subclinical hyperthyroidism include autonomously functioning thyroid adenomas and multinodular goiters, or Graves'  disease or subacute thyroiditis.   Most patients with subclinical hyperthyroidism have no clinical manifestations of hyperthyroidism, and those symptoms that are present (eg, tachycardia, tremor, dyspnea on exertion, weight loss) are mild and nonspecific." However, subclinical hyperthyroidism is associated with an increased risk of atrial fibrillation and, primarily in postmenopausal women, a decrease in bone mineral density.  - Developed severe dizziness with methimazole 5 mg BID dosing.  - Will proceed with thyroid uptake and scan  - Pt to continue with metoprolol 25 mg daily    F/U in 3 months    Signed electronically by: Lyndle Herrlich, MD  [facility] Endocrinology  [facility] [address] [place] [place], [place] [address] Phone: [phone] FAX: [phone]   CC: Hoy Register, MD [address] [place] [address] Phone: [phone] Fax: [phone]   Return to Endocrinology clinic as below: Future Appointments  Date Time Provider Department Center  07/14/2019  4:20 PM GI-315 MR 3 GI-315[facility]  07/14/2019  5:00 PM GI-315 MR 3 GI-315MRI [facility]  07/18/2019  1:45 PM Cammy Copa, MD [facility] None  08/06/2019  2:00 PM Ellamae Sia, DO [facility] None  08/08/2019 10:10 AM Hoy Register, MD [facility] None  09/28/2019 12:20 PM Rollene Rotunda, MD [facility]  10/12/2019  1:40 PM Fernie Grimm, Konrad Dolores, MD [facility] None

## 2019-07-14 ENCOUNTER — Ambulatory Visit
Admission: RE | Admit: 2019-07-14 | Discharge: 2019-07-14 | Disposition: A | Discharge status: Home / Self Care | Payer: Medicare Other | Source: Ambulatory Visit | Attending: Orthopedic Surgery | Admitting: Orthopedic Surgery

## 2019-07-14 ENCOUNTER — Other Ambulatory Visit: Payer: Self-pay

## 2019-07-14 DIAGNOSIS — M25572 Pain in left ankle and joints of left foot: Secondary | ICD-10-CM

## 2019-07-14 DIAGNOSIS — M25561 Pain in right knee: Secondary | ICD-10-CM

## 2019-07-18 ENCOUNTER — Ambulatory Visit (INDEPENDENT_AMBULATORY_CARE_PROVIDER_SITE_OTHER): Payer: Medicare Other | Admitting: Orthopedic Surgery

## 2019-07-18 ENCOUNTER — Encounter (HOSPITAL_COMMUNITY): Payer: Self-pay

## 2019-07-18 ENCOUNTER — Other Ambulatory Visit: Payer: Self-pay

## 2019-07-18 ENCOUNTER — Ambulatory Visit (HOSPITAL_COMMUNITY)
Admission: EM | Admit: 2019-07-18 | Discharge: 2019-07-18 | Disposition: A | Discharge status: Home / Self Care | Payer: Medicare Other

## 2019-07-18 DIAGNOSIS — M25561 Pain in right knee: Secondary | ICD-10-CM

## 2019-07-18 DIAGNOSIS — M79605 Pain in left leg: Secondary | ICD-10-CM | POA: Diagnosis not present

## 2019-07-18 DIAGNOSIS — M25572 Pain in left ankle and joints of left foot: Secondary | ICD-10-CM | POA: Diagnosis not present

## 2019-07-18 DIAGNOSIS — G8929 Other chronic pain: Secondary | ICD-10-CM | POA: Diagnosis not present

## 2019-07-18 DIAGNOSIS — S80862A Insect bite (nonvenomous), left lower leg, initial encounter: Secondary | ICD-10-CM

## 2019-07-18 DIAGNOSIS — W57XXXA Bitten or stung by nonvenomous insect and other nonvenomous arthropods, initial encounter: Secondary | ICD-10-CM

## 2019-07-18 MED ORDER — DOXYCYCLINE HYCLATE 100 MG PO CAPS
100.0000 mg | ORAL_CAPSULE | Freq: Two times a day (BID) | ORAL | 0 refills | Status: AC
Start: 2019-07-18 — End: 2019-07-25

## 2019-07-18 NOTE — Discharge Instructions (Addendum)
Take the medication as prescribed Warm compresses.  Follow up as needed for continued or worsening symptoms

## 2019-07-18 NOTE — ED Triage Notes (Signed)
Pt states she has had a "bug bite" to the medial aspect of her left ankle for approx 1 week. Pt c/o that site has gotten more painful and her leg/ankle feels "tight". Pt has been applying neosporin to area. Also describes serous drainage from site last night. Denies fever, chills.

## 2019-07-19 NOTE — ED Provider Notes (Signed)
[facility]    CSN: [id number] Arrival date & time: 07/18/19  1449      History   Chief Complaint Chief Complaint  Patient presents with  . Insect Bite    HPI Sylvia Hall is a 59 y.o. female.   Patient is a 59 year old female presents today with possible bug bite to the medial aspect of her left ankle.  This has been there for approximately 1 week.  Feels like the leg is becoming more painful, swollen and red.  Describes as "tight".  She has been applying Neosporin to the area with no change.  Reporting some drainage from the area last night denies any fever, chills, nausea.  ROS per HPI      Past Medical History:  Diagnosis Date  . Anxiety   . Arthritis   . Asthma   . Back pain, chronic   . Bilateral carpal tunnel syndrome   . Depression   . DVT (deep vein thrombosis) in pregnancy   . Environmental allergies   . Epilepsy ([place])   . Fatigue   . GERD (gastroesophageal reflux disease)   . Headache    sinus  . History of kidney stones   . Mitral valve prolapse   . Pericarditis   . PTSD (post-traumatic stress disorder)   . Stenosis of lumbosacral spine    numbness L hand and L leg  . Thrombophlebitis    Age 60    Patient Active Problem List   Diagnosis Date Noted  . Subclinical hyperthyroidism 07/10/2019  . Educated about COVID-19 virus infection 03/31/2019  . Tobacco abuse 08/10/2018  . Obesity (BMI 35.0-39.9 without comorbidity) 08/01/2018  . Myalgia 07/13/2018  . Chronic pain syndrome 10/07/2017  . Degenerative disc disease, lumbar 08/17/2017  . Hyperlipidemia 08/17/2017  . Left sided numbness 12/01/2013  . Acute sinusitis, unspecified 12/08/2012  . Muscle spasm of right leg 12/08/2012  . Acute upper respiratory infections of unspecified site 12/08/2012  . Severe episode of recurrent major depressive disorder ([place]) 12/07/2012  . Borderline personality disorder ([place]) 10/19/2012  . Acute posttraumatic stress disorder 08/24/2012  .  Palpitations 08/16/2012  . Neuropathy 08/16/2012  . Restless leg syndrome 08/16/2012  . Routine gynecological examination 07/31/2012  . Neurogenic pain 04/12/2012  . Anxiety 11/03/2011  . Vitamin D deficiency 08/08/2011  . Chronic back pain 05/29/2011  . MVP (mitral valve prolapse) 05/29/2011  . Asthma 05/29/2011  . Tobacco user 05/29/2011  . Chronic mastitis 05/29/2011  . Allergic rhinitis 05/29/2011    Past Surgical History:  Procedure Laterality Date  . ABDOMINAL HYSTERECTOMY     partial  . APPENDECTOMY    . BIOPSY BREAST     Right breast x 10  . BREAST LUMPECTOMY     Right breast  . CARPAL TUNNEL RELEASE Left 11/10/2017   Procedure: LEFT CARPAL TUNNEL RELEASE;  Surgeon: Meredith Pel, MD;  Location: [facility];  Service: Orthopedics;  Laterality: Left;  . CESAREAN SECTION     x 2  . NOSE SURGERY     s/p trauma  . OVARY SURGERY    . RHINOPLASTY    . TUBAL LIGATION     left tubal  . UTERINE FIBROID SURGERY      OB History    Gravida  2   Para  2   Term  0   Preterm  2   AB      Living  2     SAB  TAB      Ectopic      Multiple      Live Births               Home Medications    Prior to Admission medications   Medication Sig Start Date End Date Taking? Authorizing Provider  gabapentin (NEURONTIN) 300 MG capsule Take 1 capsule (300 mg total) by mouth at bedtime. 02/12/19  Yes Hoy Register, MD  methocarbamol (ROBAXIN) 750 MG tablet Take 750 mg by mouth 3 (three) times daily. 07/16/19  Yes [provider]  metoprolol succinate (TOPROL-XL) 50 MG 24 hr tablet TAKE 1 TABLET BY MOUTH EVERY DAY IMMEDIATELY FOLLOW A MEAL 05/04/19  Yes Rollene Rotunda, MD  metoprolol tartrate (LOPRESSOR) 25 MG tablet Take 0.5 tablets (12.5 mg total) by mouth as needed. For palpitations 05/22/19 08/20/19 Yes Rollene Rotunda, MD  rOPINIRole (REQUIP XL) 2 MG 24 hr tablet Take 1 tablet (2 mg total) by mouth at bedtime. 02/12/19  Yes Hoy Register, MD  albuterol  (PROVENTIL) (2.5 MG/3ML) 0.083% nebulizer solution Take 3 mLs (2.5 mg total) by nebulization every 6 (six) hours as needed for wheezing or shortness of breath. 01/20/18   Marcine Matar, MD  albuterol (VENTOLIN HFA) 108 (90 Base) MCG/ACT inhaler Inhale 2 puffs into the lungs every 6 (six) hours as needed for wheezing or shortness of breath. 02/12/19   Hoy Register, MD  doxycycline (VIBRAMYCIN) 100 MG capsule Take 1 capsule (100 mg total) by mouth 2 (two) times daily for 7 days. 07/18/19 07/25/19  Aldrin Engelhard, Gloris Manchester A, NP  fluticasone (FLONASE) 50 MCG/ACT nasal spray SPRAY 2 SPRAYS INTO EACH NOSTRIL EVERY DAY 04/06/19   Hoy Register, MD  Fluticasone-Salmeterol (ADVAIR) 250-50 MCG/DOSE AEPB Inhale 1 puff into the lungs 2 (two) times daily. 02/12/19   Hoy Register, MD  lidocaine (XYLOCAINE) 5 % ointment Apply 1 application topically as needed. 06/15/18   Marcello Fennel, MD  methocarbamol (ROBAXIN) 500 MG tablet Take 500 mg by mouth every 8 (eight) hours as needed for muscle spasms.    [provider]  pseudoephedrine-acetaminophen (TYLENOL SINUS) 30-500 MG TABS tablet Take 1 tablet by mouth every 4 (four) hours as needed.    [provider]  cetirizine (ZYRTEC) 10 MG tablet TAKE 1 TABLET BY MOUTH EVERY DAY 04/06/19 07/18/19  Hoy Register, MD    Family History Family History  Problem Relation Age of Onset  . Diabetes Mother   . Mitral valve prolapse Mother   . Asthma Mother   . CAD Mother 51  . Anxiety disorder Mother   . Mitral valve prolapse Sister   . Asthma Sister   . Mitral valve prolapse Son   . Alcohol abuse Son   . Diabetes Maternal Uncle   . Diabetes Maternal Grandmother   . Mitral valve prolapse Sister   . CAD Son 43       No details available  . Alcohol abuse Father   . Alcohol abuse Brother     Social History Social History   Tobacco Use  . Smoking status: Current Every Day Smoker    Packs/day: 0.50    Years: 12.00    Pack years: 6.00    Types:  Cigarettes  . Smokeless tobacco: Never Used  Vaping Use  . Vaping Use: Never used  Substance Use Topics  . Alcohol use: No    Alcohol/week: 0.0 standard drinks  . Drug use: No     Allergies   Ivp dye [iodinated  diagnostic agents], Levofloxacin, Sulfa drugs cross reactors, Dilantin [phenytoin sodium extended], Methimazole, Aspirin, Caffeine, Cymbalta [duloxetine hcl], Penicillins, Pregabalin, and Sodium nitrate   Review of Systems Review of Systems   Physical Exam Triage Vital Signs ED Triage Vitals  Enc Vitals Group     BP 07/18/19 1511 128/73     Pulse Rate 07/18/19 1511 73     Resp 07/18/19 1511 18     Temp 07/18/19 1511 98.4 F (36.9 C)     Temp Source 07/18/19 1511 Oral     SpO2 07/18/19 1511 96 %     Weight --      Height --      Head Circumference --      Peak Flow --      Pain Score 07/18/19 1509 4     Pain Loc --      Pain Edu? --      Excl. in GC? --    No data found.  Updated Vital Signs BP 128/73 (BP Location: Right Arm)   Pulse 73   Temp 98.4 F (36.9 C) (Oral)   Resp 18   SpO2 96%   Visual Acuity Right Eye Distance:   Left Eye Distance:   Bilateral Distance:    Right Eye Near:   Left Eye Near:    Bilateral Near:     Physical Exam Vitals and nursing note reviewed.  Constitutional:      General: She is not in acute distress.    Appearance: Normal appearance. She is not ill-appearing, toxic-appearing or diaphoretic.  HENT:     Head: Normocephalic.     Nose: Nose normal.  Eyes:     Conjunctiva/sclera: Conjunctivae normal.  Pulmonary:     Effort: Pulmonary effort is normal.  Musculoskeletal:        General: Normal range of motion.     Cervical back: Normal range of motion.  Skin:    General: Skin is warm and dry.     Findings: No rash.     Comments: Small opening to medial aspect of left ankle.  Mild swelling and mild erythema.  Very tender to touch No drainage  Neurological:     Mental Status: She is alert.  Psychiatric:         Mood and Affect: Mood normal.      UC Treatments / Results  Labs (all labs ordered are listed, but only abnormal results are displayed) Labs Reviewed - No data to display  EKG   Radiology No results found.  Procedures Procedures (including critical care time)  Medications Ordered in UC Medications - No data to display  Initial Impression / Assessment and Plan / UC Course  I have reviewed the triage vital signs and the nursing notes.  Pertinent labs & imaging results that were available during my care of the patient were reviewed by me and considered in my medical decision making (see chart for details).     Leg pain with possible insect bite and mild cellulitis. Treating with doxycycline Recommend warm compresses Follow up as needed for continued or worsening symptoms  Final Clinical Impressions(s) / UC Diagnoses   Final diagnoses:  Leg pain, left  Insect bite of left lower leg, initial encounter     Discharge Instructions     Take the medication as prescribed Warm compresses.  Follow up as needed for continued or worsening symptoms     ED Prescriptions    Medication Sig Dispense Auth. Provider   doxycycline (VIBRAMYCIN)  100 MG capsule Take 1 capsule (100 mg total) by mouth 2 (two) times daily for 7 days. 14 capsule Rossie Scarfone A, NP     PDMP not reviewed this encounter.   Dahlia Byes A, NP 07/19/19 0840

## 2019-07-20 ENCOUNTER — Encounter: Payer: Self-pay | Admitting: Orthopedic Surgery

## 2019-07-20 NOTE — Progress Notes (Signed)
Office Visit Note   Patient: Sylvia Hall           Date of Birth: [birth year]           MRN: [id number] Visit Date: 07/18/2019 Requested by: Charlott Rakes, MD [place],  [place] [id number] PCP: Charlott Rakes, MD  Subjective: Chief Complaint  Patient presents with  . Follow-up    HPI: Yamilee is a patient with right knee and left ankle pain.  Since have seen her she has had a right knee MRI scan which shows some patellofemoral arthritis in the lateral facet but otherwise okay.  Had 1 shot over a year ago which helped her for 4 weeks.  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  She has tried a sleeve for her left ankle.              ROS: All systems reviewed are negative as they relate to the chief complaint within the history of present illness.  Patient denies  fevers or chills.   Assessment & Plan: Visit Diagnoses:  1. Chronic pain of right knee   2. Pain in left ankle and joints of left foot     Plan: Impression is patellofemoral arthritis right knee.  Plan is quad strengthening exercises.  Hinged knee brace may help.  That is provided.  Regarding the ankle I think she has some pretty significant subtalar arthritis.  Consideration could be given towards differential injections and shoe wear with good arch support.  She will consider those options and let me know if she wants to proceed with either any knee injections or ankle injections.  Follow-up as needed.  Follow-Up Instructions: Return if symptoms worsen or fail to improve.   Orders:  No orders of the defined types were placed in this encounter.  No orders of the defined types were placed in this encounter.     Procedures: No procedures performed   Clinical Data: No additional findings.  Objective: Vital Signs: There were no vitals taken for this visit.  Physical Exam:   Constitutional: Patient appears well-developed HEENT:  Head:  Normocephalic Eyes:EOM are normal Neck: Normal range of motion Cardiovascular: Normal rate Pulmonary/chest: Effort normal Neurologic: Patient is alert Skin: Skin is warm Psychiatric: Patient has normal mood and affect    Ortho Exam: Ortho exam demonstrates mildly reduced subtalar range of motion on the left compared to the right.  Patient has palpable pedal pulses bilaterally.  5 out of 5 ankle dorsiflexion plantarflexion quad hamstring strength.  No effusion in either knee.  Patellofemoral crepitus is present bilaterally.  No focal joint line tenderness medially or laterally on the left and right-hand side is present.  No other masses lymphadenopathy or skin changes noted in that knee joint region.  Specialty Comments:  No specialty comments available.  Imaging: No results found.   PMFS History: Patient Active Problem List   Diagnosis Date Noted  . Subclinical hyperthyroidism 07/10/2019  . Educated about COVID-19 virus infection 03/31/2019  . Tobacco abuse 08/10/2018  . Obesity (BMI 35.0-39.9 without comorbidity) 08/01/2018  . Myalgia 07/13/2018  . Chronic pain syndrome 10/07/2017  . Degenerative disc disease, lumbar 08/17/2017  . Hyperlipidemia 08/17/2017  . Left sided numbness 12/01/2013  . Acute sinusitis, unspecified 12/08/2012  . Muscle spasm of right leg 12/08/2012  . Acute upper respiratory infections of unspecified site 12/08/2012  . Severe episode of recurrent major depressive disorder ([place]) 12/07/2012  . Borderline personality  disorder (HCC) 10/19/2012  . Acute posttraumatic stress disorder 08/24/2012  . Palpitations 08/16/2012  . Neuropathy 08/16/2012  . Restless leg syndrome 08/16/2012  . Routine gynecological examination 07/31/2012  . Neurogenic pain 04/12/2012  . Anxiety 11/03/2011  . Vitamin D deficiency 08/08/2011  . Chronic back pain 05/29/2011  . MVP (mitral valve prolapse) 05/29/2011  . Asthma 05/29/2011  . Tobacco user 05/29/2011  . Chronic  mastitis 05/29/2011  . Allergic rhinitis 05/29/2011   Past Medical History:  Diagnosis Date  . Anxiety   . Arthritis   . Asthma   . Back pain, chronic   . Bilateral carpal tunnel syndrome   . Depression   . DVT (deep vein thrombosis) in pregnancy   . Environmental allergies   . Epilepsy (HCC)   . Fatigue   . GERD (gastroesophageal reflux disease)   . Headache    sinus  . History of kidney stones   . Mitral valve prolapse   . Pericarditis   . PTSD (post-traumatic stress disorder)   . Stenosis of lumbosacral spine    numbness L hand and L leg  . Thrombophlebitis    Age 59    Family History  Problem Relation Age of Onset  . Diabetes Mother   . Mitral valve prolapse Mother   . Asthma Mother   . CAD Mother 24  . Anxiety disorder Mother   . Mitral valve prolapse Sister   . Asthma Sister   . Mitral valve prolapse Son   . Alcohol abuse Son   . Diabetes Maternal Uncle   . Diabetes Maternal Grandmother   . Mitral valve prolapse Sister   . CAD Son 69       No details available  . Alcohol abuse Father   . Alcohol abuse Brother     Past Surgical History:  Procedure Laterality Date  . ABDOMINAL HYSTERECTOMY     partial  . APPENDECTOMY    . BIOPSY BREAST     Right breast x 10  . BREAST LUMPECTOMY     Right breast  . CARPAL TUNNEL RELEASE Left 11/10/2017   Procedure: LEFT CARPAL TUNNEL RELEASE;  Surgeon: Cammy Copa, MD;  Location: [facility] OR;  Service: Orthopedics;  Laterality: Left;  . CESAREAN SECTION     x 2  . NOSE SURGERY     s/p trauma  . OVARY SURGERY    . RHINOPLASTY    . TUBAL LIGATION     left tubal  . UTERINE FIBROID SURGERY     Social History   Occupational History  . Not on file  Tobacco Use  . Smoking status: Current Every Day Smoker    Packs/day: 0.50    Years: 12.00    Pack years: 6.00    Types: Cigarettes  . Smokeless tobacco: Never Used  Vaping Use  . Vaping Use: Never used  Substance and Sexual Activity  . Alcohol use: No     Alcohol/week: 0.0 standard drinks  . Drug use: No  . Sexual activity: Not Currently    Birth control/protection: None

## 2019-07-26 ENCOUNTER — Telehealth: Payer: Self-pay

## 2019-07-26 MED ORDER — DOXYCYCLINE HYCLATE 100 MG PO TABS: 100.0000 mg | ORAL_TABLET | Freq: Two times a day (BID) | ORAL | 0 refills | Status: DC

## 2019-07-26 NOTE — Telephone Encounter (Signed)
Done

## 2019-07-26 NOTE — Telephone Encounter (Signed)
Spoke to patient and informed on refill.  Pt. Understood.

## 2019-07-26 NOTE — Telephone Encounter (Signed)
Pt. Went to ED for insect bite on 07/18/2019. She finished the Doxycyline course and stated her insect bite is still red and still oozes. Pt. Stated she would like if PCP can prescribed her another course of Doxycycline.

## 2019-07-30 ENCOUNTER — Encounter (HOSPITAL_COMMUNITY)
Admission: RE | Admit: 2019-07-30 | Discharge: 2019-07-30 | Disposition: A | Discharge status: Home / Self Care | Payer: Medicare Other | Source: Ambulatory Visit | Attending: Internal Medicine | Admitting: Internal Medicine

## 2019-07-30 ENCOUNTER — Other Ambulatory Visit: Payer: Self-pay

## 2019-07-30 DIAGNOSIS — E0590 Thyrotoxicosis, unspecified without thyrotoxic crisis or storm: Secondary | ICD-10-CM | POA: Insufficient documentation

## 2019-07-30 MED ORDER — SODIUM IODIDE I-123 7.4 MBQ CAPS
429.0000 | ORAL_CAPSULE | Freq: Once | ORAL | Status: AC
  Administered 2019-07-30: 429 via ORAL

## 2019-07-31 ENCOUNTER — Encounter (HOSPITAL_COMMUNITY)
Admission: RE | Admit: 2019-07-31 | Discharge: 2019-07-31 | Disposition: A | Discharge status: Home / Self Care | Payer: Medicare Other | Source: Ambulatory Visit | Attending: Internal Medicine | Admitting: Internal Medicine

## 2019-08-06 ENCOUNTER — Ambulatory Visit (INDEPENDENT_AMBULATORY_CARE_PROVIDER_SITE_OTHER): Payer: Medicare Other | Admitting: Allergy

## 2019-08-06 ENCOUNTER — Other Ambulatory Visit: Payer: Self-pay

## 2019-08-06 ENCOUNTER — Encounter: Payer: Self-pay | Admitting: Allergy

## 2019-08-06 ENCOUNTER — Other Ambulatory Visit: Payer: Self-pay | Admitting: Cardiology

## 2019-08-06 VITALS — BP 114/70 | HR 77 | Temp 98.3°F | Resp 18 | Ht 62.5 in | Wt 195.8 lb

## 2019-08-06 DIAGNOSIS — J3089 Other allergic rhinitis: Secondary | ICD-10-CM

## 2019-08-06 DIAGNOSIS — Z8709 Personal history of other diseases of the respiratory system: Secondary | ICD-10-CM

## 2019-08-06 DIAGNOSIS — J449 Chronic obstructive pulmonary disease, unspecified: Secondary | ICD-10-CM | POA: Diagnosis not present

## 2019-08-06 DIAGNOSIS — Z720 Tobacco use: Secondary | ICD-10-CM

## 2019-08-06 DIAGNOSIS — H1013 Acute atopic conjunctivitis, bilateral: Secondary | ICD-10-CM | POA: Insufficient documentation

## 2019-08-06 DIAGNOSIS — J4540 Moderate persistent asthma, uncomplicated: Secondary | ICD-10-CM | POA: Insufficient documentation

## 2019-08-06 MED ORDER — LEVALBUTEROL TARTRATE 45 MCG/ACT IN AERO: 2.0000 | INHALATION_SPRAY | Freq: Four times a day (QID) | RESPIRATORY_TRACT | 2 refills | Status: AC | PRN

## 2019-08-06 MED ORDER — OLOPATADINE HCL 0.2 % OP SOLN: 1.0000 [drp] | Freq: Every day | OPHTHALMIC | 5 refills | Status: AC | PRN

## 2019-08-06 MED ORDER — AZELASTINE-FLUTICASONE 137-50 MCG/ACT NA SUSP
1.0000 | Freq: Two times a day (BID) | NASAL | 5 refills | Status: AC
Start: 2019-08-06 — End: ?

## 2019-08-06 MED ORDER — TRELEGY ELLIPTA 100-62.5-25 MCG/INH IN AEPB: 1.0000 | INHALATION_SPRAY | Freq: Every day | RESPIRATORY_TRACT | 2 refills | Status: AC

## 2019-08-06 NOTE — Patient Instructions (Addendum)
Today's skin testing showed: Positive to grass, dust mites, cat and dog. Borderline to peanuts - okay to eat for now.   Environmental allergies  Start environmental control measures as below.  May use over the counter antihistamines such as Zyrtec (cetirizine), Claritin (loratadine), Allegra (fexofenadine), or Xyzal (levocetirizine) daily as needed. May take twice a day if needed.  Start dymista (fluticasone + azelastine nasal spray combination) 1 spray per nostril twice a day.  This replaces Flonase (fluticasone) for now. If it's not covered let us know.   Nasal saline spray (i.e., Simply Saline) or nasal saline lavage (i.e., NeilMed) is recommended as needed and prior to medicated nasal sprays.  May use olopatadine eye drops 0.2% once a day as needed for itchy/watery eyes.  Asthma: . Decrease smoking.  . Daily controller medication(s): start Trelegy 1 puff once a day and rinse mouth after each use.  o Stop Advair for now.  . May use levoalbuterol rescue inhaler 2 puffs every 4 to 6 hours as needed for shortness of breath, chest tightness, coughing, and wheezing. May use levoalbuterol rescue inhaler 2 puffs 5 to 15 minutes prior to strenuous physical activities. Monitor frequency of use.  . Asthma control goals:  o Full participation in all desired activities (may need albuterol before activity) o Albuterol use two times or less a week on average (not counting use with activity) o Cough interfering with sleep two times or less a month o Oral steroids no more than once a year o No hospitalizations  Infections:  Keep track of infections.  Get bloodwork:  We are ordering labs, so please allow 1-2 weeks for the results to come back. With the newly implemented Cures Act, the labs might be visible to you at the same time that they become visible to me. However, I will not address the results until all of the results are back, so please be patient.    Recommend annual flu  vaccine and COVID-19 vaccine.  Follow up in 2 months or sooner if needed.   Reducing Pollen Exposure . Pollen seasons: trees (spring), grass (summer) and ragweed/weeds (fall). Marland Kitchen Keep windows closed in your home and car to lower pollen exposure.  Lilian Kapur air conditioning in the bedroom and throughout the house if possible.  . Avoid going out in dry windy days - especially early morning. . Pollen counts are highest between 5 - 10 AM and on dry, hot and windy days.  . Save outside activities for late afternoon or after a heavy rain, when pollen levels are lower.  . Avoid mowing of grass if you have grass pollen allergy. Marland Kitchen Be aware that pollen can also be transported indoors on people and pets.  . Dry your clothes in an automatic dryer rather than hanging them outside where they might collect pollen.  . Rinse hair and eyes before bedtime.  Control of House Dust Mite Allergen . Dust mite allergens are a common trigger of allergy and asthma symptoms. While they can be found throughout the house, these microscopic creatures thrive in warm, humid environments such as bedding, upholstered furniture and carpeting. . Because so much time is spent in the bedroom, it is essential to reduce mite levels there.  . Encase pillows, mattresses, and box springs in special allergen-proof fabric covers or airtight, zippered plastic covers.  . Bedding should be washed weekly in hot water (130 F) and dried in a hot dryer. Allergen-proof covers are available for comforters and pillows that can't be  regularly washed.  Wendee Copp the allergy-proof covers every few months. Minimize clutter in the bedroom. Keep pets out of the bedroom.  Marland Kitchen Keep humidity less than 50% by using a dehumidifier or air conditioning. You can buy a humidity measuring device called a hygrometer to monitor this.  . If possible, replace carpets with hardwood, linoleum, or washable area rugs. If that's not possible, vacuum frequently with a vacuum  that has a HEPA filter. . Remove all upholstered furniture and non-washable window drapes from the bedroom. . Remove all non-washable stuffed toys from the bedroom.  Wash stuffed toys weekly.  Pet Allergen Avoidance: . Contrary to popular opinion, there are no "hypoallergenic" breeds of dogs or cats. That is because people are not allergic to an animal's hair, but to an allergen found in the animal's saliva, dander (dead skin flakes) or urine. Pet allergy symptoms typically occur within minutes. For some people, symptoms can build up and become most severe 8 to 12 hours after contact with the animal. People with severe allergies can experience reactions in public places if dander has been transported on the pet owners' clothing. Marland Kitchen Keeping an animal outdoors is only a partial solution, since homes with pets in the yard still have higher concentrations of animal allergens. . Before getting a pet, ask your allergist to determine if you are allergic to animals. If your pet is already considered part of your family, try to minimize contact and keep the pet out of the bedroom and other rooms where you spend a great deal of time. . As with dust mites, vacuum carpets often or replace carpet with a hardwood floor, tile or linoleum. . High-efficiency particulate air (HEPA) cleaners can reduce allergen levels over time. . While dander and saliva are the source of cat and dog allergens, urine is the source of allergens from rabbits, hamsters, mice and [place] pigs; so ask a non-allergic family member to clean the animal's cage. . If you have a pet allergy, talk to your allergist about the potential for allergy immunotherapy (allergy shots). This strategy can often provide long-term relief.

## 2019-08-06 NOTE — Progress Notes (Signed)
New Patient Note  RE: Sylvia Hall MRN: [id number] DOB: May 23, 1960 Date of Office Visit: 08/06/2019  Referring provider: Hoy Register, MD Primary care provider: Hoy Register, MD  Chief Complaint: Allergic Rhinitis   History of Present Illness: I had the pleasure of seeing Sylvia Hall for initial evaluation at the [facility] on 08/06/2019. She is a 59 y.o. female, who is referred here by Hoy Register, MD for the evaluation of allergic rhinitis. Up to date with COVID-19 vaccine: no  Rhinitis:  She reports symptoms of headache, nasal congestion, sinus infections, itchy/watery eyes, sneezing, coughing. Symptoms have been going on for 30+ years but worse the last 9 years since moving to [facility]. The symptoms are present all year around. Other triggers include exposure to cats. Anosmia: diminished. Headache: yes. She has used zyrtec, Claritin, Flonase with minimal improvement in symptoms. Sinus infections: 5 times. Previous work up includes: last skin testing was in her 36s - positive to multiple items per patient report. Patient has allergy testing and was on allergy injections in her 70s for 3 years with good benefit.  Previous ENT evaluation: no but had nasal surgery after broken nose in her teens. Previous sinus imaging: no. History of nasal polyps: no. Last eye exam: within the past year. History of reflux: yes and takes famotidine daily.  Asthma:  She reports symptoms of chest tightness, shortness of breath, coughing, wheezing for 9 years. Current medications include albuterol prn and Advair 1 puff twice a day x 8 yrs which help. She reports not using aerochamber with inhalers. She tried the following inhalers: none. Main triggers are allergies, exercise. In the last month, frequency of symptoms: 3-4 times/week.. Frequency of SABA use: 3-4x/week. Interference with physical activity: yes. Sleep is undisturbed. In the last 12 months, emergency room  visits/urgent care visits/doctor office visits or hospitalizations due to respiratory issues: 0. In the last 12 months, oral steroids courses: 4. Lifetime history of hospitalization for respiratory issues: 0. Prior intubations: 0. History of pneumonia: yes. She was not evaluated by allergist/pulmonologist in the past. Smoking exposure: 1 pack per day x 17 yrs. Up to date with flu vaccine: no. Up to date with COVID-19 vaccine: no.   Patient has hyperthyroidism.   Assessment and Plan: [name] is a 59 y.o. female with: Asthma-COPD overlap syndrome (HCC) Respiratory symptoms for 9 years. Currently on Advair 2 puff BID x 8 yrs and using albuterol 3-4 times a week with good benefit. Main triggers are allergies and exertion. 4 courses of prednisone the last year. Currently smoking 1 ppd x 17 years. Albuterol worsens her palpitations.  Today's spirometry showed: possible restrictive disease with no improvement in FEV1 post bronchodilator treatment but clinically feeling improved.  She most likely has asthma with COPD. Marland Kitchen Discussed smoking cessation. . Daily controller medication(s): start Trelegy 1 puff once a day and rinse mouth after each use. Sample given.  o Stop Advair for now.  . May use levoalbuterol rescue inhaler 2 puffs every 4 to 6 hours as needed for shortness of breath, chest tightness, coughing, and wheezing. May use levoalbuterol rescue inhaler 2 puffs 5 to 15 minutes prior to strenuous physical activities. Monitor frequency of use.  . Repeat spirometry at next visit.   Other allergic rhinitis Perennial rhinoconjunctivitis symptoms for 30+ years but worse since moved to [place] 9 years ago.  Tried Zyrtec, Claritin and Flonase with minimal benefit.  Frequent sinus infections.  Last skin testing in  her 39s were positive to multiple items and was on allergy immunotherapy for a few years with good benefit.  History of nasal fracture in her teens.  Today's skin testing  showed: Positive to grass, dust mites, cat and dog.  Start environmental control measures as below.  May use over the counter antihistamines such as Zyrtec (cetirizine), Claritin (loratadine), Allegra (fexofenadine), or Xyzal (levocetirizine) daily as needed. May take twice a day if needed.  Start dymista (fluticasone + azelastine nasal spray combination) 1 spray per nostril twice a day.  This replaces Flonase (fluticasone) for now. If it's not covered let us know.   Nasal saline spray (i.e., Simply Saline) or nasal saline lavage (i.e., NeilMed) is recommended as needed and prior to medicated nasal sprays.  May use olopatadine eye drops 0.2% once a day as needed for itchy/watery eyes.  Allergic conjunctivitis of both eyes See assessment and plan as above for allergic rhinitis.  History of frequent upper respiratory infection History of frequent respiratory infections including sinusitis, bronchitis. No previous immune evaluation.  Keep track of infections.  Get bloodwork to look at immune system.   Recommend annual flu vaccine and COVID-19 vaccine.  Return in about 2 months (around 10/06/2019).  Meds ordered this encounter  Medications  . levalbuterol (XOPENEX HFA) 45 MCG/ACT inhaler    Sig: Inhale 2 puffs into the lungs every 6 (six) hours as needed for wheezing or shortness of breath (coughing fit).    Dispense:  1 Inhaler    Refill:  2    Has palpitations.  . Azelastine-Fluticasone 137-50 MCG/ACT SUSP    Sig: Place 1 spray into the nose in the morning and at bedtime.    Dispense:  23 g    Refill:  5  . Olopatadine HCl 0.2 % SOLN    Sig: Apply 1 drop to eye daily as needed (itchy/watery eyes).    Dispense:  2.5 mL    Refill:  5  . Fluticasone-Umeclidin-Vilant (TRELEGY ELLIPTA) 100-62.5-25 MCG/INH AEPB    Sig: Inhale 1 puff into the lungs daily. Rinse mouth after each use.    Dispense:  60 each    Refill:  2    Lab Orders     CBC with Differential/Platelet     Strep  pneumoniae 23 Serotypes IgG     Complement, total     Diphtheria / Tetanus Antibody Panel     IgG, IgA, IgM  Other allergy screening: Food allergy: no  Caffeine - palpitations Peanut butter, onions, msg causes headaches Medication allergy: yes  See list Hymenoptera allergy: no Urticaria: no Eczema:no  Diagnostics: Spirometry:  Tracings reviewed. Her effort: Good reproducible efforts. FVC: 2.52L FEV1: 1.81L, 72% predicted FEV1/FVC ratio: 72% Interpretation: Spirometry consistent with possible restrictive disease with no improvement in FEV1 post bronchodilator treatment but clinically feeling improved. Please see scanned spirometry results for details.  Skin Testing: Environmental allergy panel and select foods. Positive to grass, dust mites, cat and dog. Borderline to peanuts - okay to eat for now.  Results discussed with patient/family.  Airborne Adult Perc - 08/06/19 1555    Time Antigen Placed 1500    Allergen Manufacturer Lavella Hammock    Location Back    Number of Test 59    Panel 1 Select    1. Control-Buffer 50% Glycerol Negative    2. Control-Histamine 1 mg/ml 2+    3. Albumin saline Negative    4. Screven 2+    5. [place] Negative    6. [employer]  Negative    7. [place] Blue 4+    8. Meadow Fescue 2+    9. Perennial Rye 3+    10. Sweet Vernal 2+    11. Timothy 2+    12. Cocklebur Negative    13. Burweed Marshelder Negative    14. Ragweed, short Negative    15. Ragweed, Giant Negative    16. Plantain,  English Negative    17. Lamb's Quarters Negative    18. Sheep Sorrell Negative    19. Rough Pigweed Negative    20. Marsh Elder, Rough Negative    21. Mugwort, Common Negative    22. Ash mix Negative    23. Birch mix Negative    24. Beech American Negative    25. Box, Elder Negative    26. Cedar, red Negative    27. Cottonwood, Guinea-Bissau Negative    28. Elm mix Negative    29. Hickory Negative    30. Maple mix Negative    31. Oak, Guinea[place] mix Negative    32.  Pecan Pollen Negative    33. Pine mix Negative    34. Sycamore Eastern Negative    35. Walnut, Black Pollen Negative    36. Alternaria alternata Negative    37. Cladosporium Herbarum Negative    38. Aspergillus mix Negative    39. Penicillium mix Negative    40. Bipolaris sorokiniana (Helminthosporium) Negative    41. Drechslera spicifera (Curvularia) Negative    42. Mucor plumbeus Negative    43. Fusarium moniliforme Negative    44. Aureobasidium pullulans (pullulara) Negative    45. Rhizopus oryzae Negative    46. Botrytis cinera Negative    47. Epicoccum nigrum Negative    48. Phoma betae Negative    49. Candida Albicans Negative    50. Trichophyton mentagrophytes Negative    51. Mite, D Farinae  5,000 AU/ml 2+    52. Mite, D Pteronyssinus  5,000 AU/ml 4+    53. Cat Hair 10,000 BAU/ml 2+    54.  Dog Epithelia 2+    55. Mixed Feathers Negative    56. Horse Epithelia Negative    57. Cockroach, German Negative    58. Mouse Negative    59. Tobacco Leaf Negative          Food Perc - 08/06/19 1557      Test Information   Time Antigen Placed 1500    Allergen Manufacturer Waynette Buttery    Location Back    Number of allergen test 10    Food Select      Food   1. Peanut --   +/-   2. Soybean food Negative    3. Wheat, whole Negative    4. Sesame Negative    5. Milk, cow Negative    6. Egg White, chicken Negative    7. Casein Negative    8. Shellfish mix Negative    9. Fish mix Negative    10. Cashew Negative          Intradermal - 08/06/19 1557    Time Antigen Placed 1530    Allergen Manufacturer Waynette Buttery    Location Arm    Number of Test 11    Intradermal Select    Control Negative    [place] --   +/-   Johnson --   +/-   Ragweed mix Negative    Weed mix Negative    Tree mix Negative    Mold 1 Negative  Mold 2 Negative    Mold 3 Negative    Mold 4 Negative    Cockroach Negative           Past Medical History: Patient Active Problem List   Diagnosis Date  Noted  . History of frequent upper respiratory infection 08/06/2019  . Allergic conjunctivitis of both eyes 08/06/2019  . Subclinical hyperthyroidism 07/10/2019  . Educated about COVID-19 virus infection 03/31/2019  . Tobacco use 08/10/2018  . Obesity (BMI 35.0-39.9 without comorbidity) 08/01/2018  . Myalgia 07/13/2018  . Chronic pain syndrome 10/07/2017  . Degenerative disc disease, lumbar 08/17/2017  . Hyperlipidemia 08/17/2017  . Left sided numbness 12/01/2013  . Acute sinusitis, unspecified 12/08/2012  . Muscle spasm of right leg 12/08/2012  . Acute upper respiratory infections of unspecified site 12/08/2012  . Severe episode of recurrent major depressive disorder (HCC) 12/07/2012  . Borderline personality disorder (HCC) 10/19/2012  . Acute posttraumatic stress disorder 08/24/2012  . Palpitations 08/16/2012  . Neuropathy 08/16/2012  . Restless leg syndrome 08/16/2012  . Routine gynecological examination 07/31/2012  . Neurogenic pain 04/12/2012  . Anxiety 11/03/2011  . Vitamin D deficiency 08/08/2011  . Chronic back pain 05/29/2011  . MVP (mitral valve prolapse) 05/29/2011  . Asthma-COPD overlap syndrome (HCC) 05/29/2011  . Tobacco user 05/29/2011  . Chronic mastitis 05/29/2011  . Other allergic rhinitis 05/29/2011   Past Medical History:  Diagnosis Date  . Anxiety   . Arthritis   . Asthma   . Back pain, chronic   . Bilateral carpal tunnel syndrome   . Depression   . DVT (deep vein thrombosis) in pregnancy   . Environmental allergies   . Epilepsy (HCC)   . Fatigue   . GERD (gastroesophageal reflux disease)   . Headache    sinus  . History of kidney stones   . Mitral valve prolapse   . Pericarditis   . PTSD (post-traumatic stress disorder)   . Recurrent upper respiratory infection (URI)   . Stenosis of lumbosacral spine    numbness L hand and L leg  . Thrombophlebitis    Age 78  . Urticaria    Past Surgical History: Past Surgical History:  Procedure  Laterality Date  . ABDOMINAL HYSTERECTOMY     partial  . APPENDECTOMY    . BIOPSY BREAST     Right breast x 10  . BREAST LUMPECTOMY     Right breast  . CARPAL TUNNEL RELEASE Left 11/10/2017   Procedure: LEFT CARPAL TUNNEL RELEASE;  Surgeon: Cammy Copa, MD;  Location: [facility] OR;  Service: Orthopedics;  Laterality: Left;  . CESAREAN SECTION     x 2  . NOSE SURGERY     s/p trauma  . OVARY SURGERY    . RHINOPLASTY    . TUBAL LIGATION     left tubal  . UTERINE FIBROID SURGERY     Medication List:  Current Outpatient Medications  Medication Sig Dispense Refill  . albuterol (PROVENTIL) (2.5 MG/3ML) 0.083% nebulizer solution Take 3 mLs (2.5 mg total) by nebulization every 6 (six) hours as needed for wheezing or shortness of breath. 150 mL 1  . gabapentin (NEURONTIN) 300 MG capsule Take 1 capsule (300 mg total) by mouth at bedtime. 30 capsule 6  . lidocaine (XYLOCAINE) 5 % ointment Apply 1 application topically as needed. 35.44 g 0  . methocarbamol (ROBAXIN) 750 MG tablet Take 750 mg by mouth 3 (three) times daily.    . metoprolol succinate (TOPROL-XL) 50 MG 24  hr tablet TAKE 1 TABLET BY MOUTH EVERY DAY IMMEDIATELY FOLLOW A MEAL 90 tablet 0  . metoprolol tartrate (LOPRESSOR) 25 MG tablet Take 0.5 tablets (12.5 mg total) by mouth as needed. For palpitations 30 tablet 3  . pseudoephedrine-acetaminophen (TYLENOL SINUS) 30-500 MG TABS tablet Take 1 tablet by mouth every 4 (four) hours as needed.    Marland Kitchen rOPINIRole (REQUIP XL) 2 MG 24 hr tablet Take 1 tablet (2 mg total) by mouth at bedtime. 30 tablet 6  . Azelastine-Fluticasone 137-50 MCG/ACT SUSP Place 1 spray into the nose in the morning and at bedtime. 23 g 5  . Fluticasone-Umeclidin-Vilant (TRELEGY ELLIPTA) 100-62.5-25 MCG/INH AEPB Inhale 1 puff into the lungs daily. Rinse mouth after each use. 60 each 2  . levalbuterol (XOPENEX HFA) 45 MCG/ACT inhaler Inhale 2 puffs into the lungs every 6 (six) hours as needed for wheezing or shortness of  breath (coughing fit). 1 Inhaler 2  . Olopatadine HCl 0.2 % SOLN Apply 1 drop to eye daily as needed (itchy/watery eyes). 2.5 mL 5   Current Facility-Administered Medications  Medication Dose Route Frequency Provider Last Rate Last Admin  . methylPREDNISolone acetate (DEPO-MEDROL) injection 80 mg  80 mg Other Once Tyrell Antonio, MD       Allergies: Allergies  Allergen Reactions  . Ivp Dye [Iodinated Diagnostic Agents] Hives  . Levofloxacin Hives  . Sulfa Drugs Cross Reactors Hives  . Dilantin [Phenytoin Sodium Extended] Hives and Itching  . Methimazole Other (See Comments)    dizziness  . Aspirin Other (See Comments)    Upset stomach, resolves with GERD meds   . Caffeine Other (See Comments)    Heart races  . Cymbalta [Duloxetine Hcl] Diarrhea and Nausea Only    Dizzy  . Penicillins Rash    Pt called in stating she  Was prescribed Pen VK in ED and developed a generalized raised rash.  She has taken amoxicillin and augmentin since this time and not had any problem  . Pregabalin Rash  . Sodium Nitrate Other (See Comments)    headaches   Social History: Social History   Socioeconomic History  . Marital status: Significant Other    Spouse name: Not on file  . Number of children: 2  . Years of education: Not on file  . Highest education level: Not on file  Occupational History  . Not on file  Tobacco Use  . Smoking status: Current Every Day Smoker    Packs/day: 0.50    Years: 12.00    Pack years: 6.00    Types: Cigarettes  . Smokeless tobacco: Never Used  Vaping Use  . Vaping Use: Never used  Substance and Sexual Activity  . Alcohol use: No    Alcohol/week: 0.0 standard drinks  . Drug use: No  . Sexual activity: Not Currently    Birth control/protection: None  Other Topics Concern  . Not on file  Social History Narrative   Lives alone.     Social Determinants of Health   Financial Resource Strain:   . Difficulty of Paying Living Expenses:   Food  Insecurity:   . Worried About Programme researcher, broadcasting/film/video in the Last Year:   . Barista in the Last Year:   Transportation Needs:   . Freight forwarder (Medical):   Marland Kitchen Lack of Transportation (Non-Medical):   Physical Activity:   . Days of Exercise per Week:   . Minutes of Exercise per Session:   Stress:   .  Feeling of Stress :   Social Connections:   . Frequency of Communication with Friends and Family:   . Frequency of Social Gatherings with Friends and Family:   . Attends Religious Services:   . Active Member of Clubs or Organizations:   . Attends BankerClub or Organization Meetings:   Marland Kitchen. Marital Status:    Lives in a 59 year old home. Smoking: 1 pack per day x 17 yrs Occupation: not employed  Environmental HistorySurveyor, minerals: Water Damage/mildew in the house: yes Carpet in the family room: no Carpet in the bedroom: no Heating: electric Cooling: window Pet: yes 2 dogs x 9 yrs  Family History: Family History  Problem Relation Age of Onset  . Diabetes Mother   . Mitral valve prolapse Mother   . Asthma Mother   . CAD Mother 6173  . Anxiety disorder Mother   . Mitral valve prolapse Sister   . Asthma Sister   . Allergic rhinitis Sister   . Mitral valve prolapse Son   . Alcohol abuse Son   . Diabetes Maternal Uncle   . Diabetes Maternal Grandmother   . Mitral valve prolapse Sister   . CAD Son [id number]       No details available  . Alcohol abuse Father   . Alcohol abuse Brother    Review of Systems  Constitutional: Negative for appetite change, chills, fever and unexpected weight change.  HENT: Positive for congestion and sneezing.   Eyes: Positive for itching.  Respiratory: Positive for cough. Negative for chest tightness, shortness of breath and wheezing.   Cardiovascular: Negative for chest pain.  Gastrointestinal: Negative for abdominal pain.  Genitourinary: Negative for difficulty urinating.  Skin: Negative for rash.  Allergic/Immunologic: Positive for environmental allergies.   Neurological: Positive for headaches.   Objective: BP 114/70 (BP Location: Left Arm, Patient Position: Sitting, Cuff Size: Normal)   Pulse 77   Temp 98.3 F (36.8 C) (Temporal)   Resp 18   Ht 5' 2.5" (1.588 m)   Wt 195 lb 12.8 oz (88.8 kg)   SpO2 96%   BMI 35.24 kg/m  Body mass index is 35.24 kg/m. Physical Exam Vitals and nursing note reviewed.  Constitutional:      Appearance: Normal appearance. She is well-developed.  HENT:     Head: Normocephalic and atraumatic.     Right Ear: Tympanic membrane and external ear normal.     Left Ear: External ear normal.     Nose: Nose normal.     Mouth/Throat:     Mouth: Mucous membranes are moist.     Pharynx: Oropharynx is clear.  Eyes:     Conjunctiva/sclera: Conjunctivae normal.  Cardiovascular:     Rate and Rhythm: Normal rate and regular rhythm.     Heart sounds: Normal heart sounds. No murmur heard.  No friction rub. No gallop.   Pulmonary:     Effort: Pulmonary effort is normal.     Breath sounds: Normal breath sounds. No wheezing, rhonchi or rales.  Musculoskeletal:     Cervical back: Neck supple.  Skin:    General: Skin is warm.     Findings: No rash.  Neurological:     Mental Status: She is alert and oriented to person, place, and time.  Psychiatric:        Behavior: Behavior normal.    The plan was reviewed with the patient/family, and all questions/concerned were addressed.  It was my pleasure to see Britta MccreedyBarbara today and participate in her care. Please  feel free to contact me with any questions or concerns.  Sincerely,  Rexene Alberts, DO Allergy & Immunology  [facility] office: [phone] [facility] office: [place] office: [phone]

## 2019-08-06 NOTE — Assessment & Plan Note (Signed)
History of frequent respiratory infections including sinusitis, bronchitis. No previous immune evaluation.  Keep track of infections.  Get bloodwork to look at immune system.   Recommend annual flu vaccine and COVID-19 vaccine.

## 2019-08-06 NOTE — Assessment & Plan Note (Addendum)
Respiratory symptoms for 9 years. Currently on Advair 2 puff BID x 8 yrs and using albuterol 3-4 times a week with good benefit. Main triggers are allergies and exertion. 4 courses of prednisone the last year. Currently smoking 1 ppd x 17 years. Albuterol worsens her palpitations.  Today's spirometry showed: possible restrictive disease with no improvement in FEV1 post bronchodilator treatment but clinically feeling improved.  She most likely has asthma with COPD. Marland Kitchen Discussed smoking cessation. . Daily controller medication(s): start Trelegy 1 puff once a day and rinse mouth after each use. Sample given.  o Stop Advair for now.  . May use levoalbuterol rescue inhaler 2 puffs every 4 to 6 hours as needed for shortness of breath, chest tightness, coughing, and wheezing. May use levoalbuterol rescue inhaler 2 puffs 5 to 15 minutes prior to strenuous physical activities. Monitor frequency of use.  . Repeat spirometry at next visit.

## 2019-08-06 NOTE — Assessment & Plan Note (Signed)
   See assessment and plan as above for allergic rhinitis.  

## 2019-08-06 NOTE — Assessment & Plan Note (Addendum)
Perennial rhinoconjunctivitis symptoms for 30+ years but worse since moved to [place] 9 years ago.  Tried Zyrtec, Claritin and Flonase with minimal benefit.  Frequent sinus infections.  Last skin testing in her 74s were positive to multiple items and was on allergy immunotherapy for a few years with good benefit.  History of nasal fracture in her teens.  Today's skin testing showed: Positive to grass, dust mites, cat and dog.  Start environmental control measures as below.  May use over the counter antihistamines such as Zyrtec (cetirizine), Claritin (loratadine), Allegra (fexofenadine), or Xyzal (levocetirizine) daily as needed. May take twice a day if needed.  Start dymista (fluticasone + azelastine nasal spray combination) 1 spray per nostril twice a day.  This replaces Flonase (fluticasone) for now. If it's not covered let us know.   Nasal saline spray (i.e., Simply Saline) or nasal saline lavage (i.e., NeilMed) is recommended as needed and prior to medicated nasal sprays.  May use olopatadine eye drops 0.2% once a day as needed for itchy/watery eyes.

## 2019-08-07 ENCOUNTER — Other Ambulatory Visit: Payer: Self-pay | Admitting: Cardiology

## 2019-08-07 NOTE — Telephone Encounter (Signed)
*  STAT* If patient is at the pharmacy, call can be transferred to refill team.   1. Which medications need to be refilled? (please list name of each medication and dose if known) new prescription for Metoprolol Succinate  2. Which pharmacy/location (including street and city if local pharmacy) is medication to be sent to? [facility]- [address], [place]  3. Do they need a 30 day or 90 day supply?90 days and refills

## 2019-08-08 ENCOUNTER — Other Ambulatory Visit: Payer: Self-pay

## 2019-08-08 ENCOUNTER — Other Ambulatory Visit (HOSPITAL_COMMUNITY)
Admission: RE | Admit: 2019-08-08 | Discharge: 2019-08-08 | Disposition: A | Discharge status: Home / Self Care | Payer: Medicare Other | Source: Ambulatory Visit | Attending: Family Medicine | Admitting: Family Medicine

## 2019-08-08 ENCOUNTER — Encounter: Payer: Self-pay | Admitting: Family Medicine

## 2019-08-08 ENCOUNTER — Ambulatory Visit (HOSPITAL_BASED_OUTPATIENT_CLINIC_OR_DEPARTMENT_OTHER): Payer: Medicare Other | Admitting: Family Medicine

## 2019-08-08 VITALS — BP 99/66 | HR 72 | Ht 63.0 in | Wt 199.0 lb

## 2019-08-08 DIAGNOSIS — Z1231 Encounter for screening mammogram for malignant neoplasm of breast: Secondary | ICD-10-CM | POA: Diagnosis not present

## 2019-08-08 DIAGNOSIS — Z720 Tobacco use: Secondary | ICD-10-CM

## 2019-08-08 DIAGNOSIS — Z0000 Encounter for general adult medical examination without abnormal findings: Secondary | ICD-10-CM | POA: Diagnosis not present

## 2019-08-08 DIAGNOSIS — Z1211 Encounter for screening for malignant neoplasm of colon: Secondary | ICD-10-CM | POA: Diagnosis not present

## 2019-08-08 DIAGNOSIS — Z124 Encounter for screening for malignant neoplasm of cervix: Secondary | ICD-10-CM | POA: Diagnosis not present

## 2019-08-08 DIAGNOSIS — Z1151 Encounter for screening for human papillomavirus (HPV): Secondary | ICD-10-CM | POA: Diagnosis not present

## 2019-08-08 MED ORDER — CYCLOBENZAPRINE HCL 10 MG PO TABS
10.0000 mg | ORAL_TABLET | Freq: Three times a day (TID) | ORAL | 0 refills | Status: DC | PRN
Start: 2019-08-08 — End: 2019-08-22

## 2019-08-08 NOTE — Progress Notes (Signed)
Need a new muscle relaxer that her insurance will cover.

## 2019-08-08 NOTE — Patient Instructions (Signed)
  Ms. Marcotte , Thank you for taking time to come for your Medicare Wellness Visit. I appreciate your ongoing commitment to your health goals. Please review the following plan we discussed and let me know if I can assist you in the future.   These are the goals we discussed: Goals   None     This is a list of the screening recommended for you and due dates:  Health Maintenance  Topic Date Due  . COVID-19 Vaccine (1) Never done  . Mammogram  Never done  . Colon Cancer Screening  Never done  . Pap Smear  08/01/2015  . Flu Shot  09/09/2019  . Tetanus Vaccine  07/14/2027  .  Hepatitis C: One time screening is recommended by Center for Disease Control  (CDC) for  adults born from [birth year] through 1965.   Completed  . HIV Screening  Completed

## 2019-08-08 NOTE — Progress Notes (Signed)
Subjective:    Sylvia Hall is a 59 y.o. female who presents for a Welcome to Medicare exam.  She is requesting an alternative to Robaxin as this is not covered by her insurance.  Also states Requip which she takes for restless legs is not covered as well.  Review of Systems General: negative for fever, weight loss, appetite change Eyes: no visual symptoms. ENT: no ear symptoms, no sinus tenderness, no nasal congestion or sore throat. Neck: no pain  Respiratory: no wheezing, shortness of breath, cough Cardiovascular: no chest pain, no dyspnea on exertion, no pedal edema, no orthopnea. Gastrointestinal: no abdominal pain, no diarrhea, no constipation Genito-Urinary: no urinary frequency, no dysuria, no polyuria. Hematologic: no bruising Endocrine: no cold or heat intolerance Neurological: no headaches, no seizures, no tremors Musculoskeletal: arthralgias Skin: rash at the site of a previous spider bite Psychological: no depression, no anxiety,           Objective:    Today's Vitals   08/08/19 1015  BP: 99/66  Pulse: 72  SpO2: 96%  Weight: 199 lb (90.3 kg)  Height: 5\' 3"  (1.6 m)  Body mass index is 35.25 kg/m.  Medications Outpatient Encounter Medications as of 08/08/2019  Medication Sig  . albuterol (PROVENTIL) (2.5 MG/3ML) 0.083% nebulizer solution Take 3 mLs (2.5 mg total) by nebulization every 6 (six) hours as needed for wheezing or shortness of breath.  . Azelastine-Fluticasone 137-50 MCG/ACT SUSP Place 1 spray into the nose in the morning and at bedtime.  . Fluticasone-Umeclidin-Vilant (TRELEGY ELLIPTA) 100-62.5-25 MCG/INH AEPB Inhale 1 puff into the lungs daily. Rinse mouth after each use.  . gabapentin (NEURONTIN) 300 MG capsule Take 1 capsule (300 mg total) by mouth at bedtime.  . levalbuterol (XOPENEX HFA) 45 MCG/ACT inhaler Inhale 2 puffs into the lungs every 6 (six) hours as needed for wheezing or shortness of breath (coughing fit).  03-09-1985 lidocaine  (XYLOCAINE) 5 % ointment Apply 1 application topically as needed.  . metoprolol succinate (TOPROL-XL) 50 MG 24 hr tablet TAKE 1 TABLET BY MOUTH EVERY DAY IMMEDIATELY FOLLOWING A MEAL  . metoprolol tartrate (LOPRESSOR) 25 MG tablet Take 0.5 tablets (12.5 mg total) by mouth as needed. For palpitations  . Olopatadine HCl 0.2 % SOLN Apply 1 drop to eye daily as needed (itchy/watery eyes).  . pseudoephedrine-acetaminophen (TYLENOL SINUS) 30-500 MG TABS tablet Take 1 tablet by mouth every 4 (four) hours as needed.  Marland Kitchen rOPINIRole (REQUIP XL) 2 MG 24 hr tablet Take 1 tablet (2 mg total) by mouth at bedtime.  . [DISCONTINUED] methocarbamol (ROBAXIN) 750 MG tablet Take 750 mg by mouth 3 (three) times daily.  . cyclobenzaprine (FLEXERIL) 10 MG tablet Take 1 tablet (10 mg total) by mouth 3 (three) times daily as needed for muscle spasms.  . [DISCONTINUED] cetirizine (ZYRTEC) 10 MG tablet TAKE 1 TABLET BY MOUTH EVERY DAY   Facility-Administered Encounter Medications as of 08/08/2019  Medication  . methylPREDNISolone acetate (DEPO-MEDROL) injection 80 mg     History: Past Medical History:  Diagnosis Date  . Anxiety   . Arthritis   . Asthma   . Back pain, chronic   . Bilateral carpal tunnel syndrome   . Depression   . DVT (deep vein thrombosis) in pregnancy   . Environmental allergies   . Epilepsy (HCC)   . Fatigue   . GERD (gastroesophageal reflux disease)   . Headache    sinus  . History of kidney stones   . Mitral valve prolapse   .  Pericarditis   . PTSD (post-traumatic stress disorder)   . Recurrent upper respiratory infection (URI)   . Stenosis of lumbosacral spine    numbness L hand and L leg  . Thrombophlebitis    Age 59  . Urticaria    Past Surgical History:  Procedure Laterality Date  . ABDOMINAL HYSTERECTOMY     partial  . APPENDECTOMY    . BIOPSY BREAST     Right breast x 10  . BREAST LUMPECTOMY     Right breast  . CARPAL TUNNEL RELEASE Left 11/10/2017   Procedure: LEFT  CARPAL TUNNEL RELEASE;  Surgeon: Cammy Copa, MD;  Location: [facility] OR;  Service: Orthopedics;  Laterality: Left;  . CESAREAN SECTION     x 2  . NOSE SURGERY     s/p trauma  . OVARY SURGERY    . RHINOPLASTY    . TUBAL LIGATION     left tubal  . UTERINE FIBROID SURGERY      Family History  Problem Relation Age of Onset  . Diabetes Mother   . Mitral valve prolapse Mother   . Asthma Mother   . CAD Mother 38  . Anxiety disorder Mother   . Mitral valve prolapse Sister   . Asthma Sister   . Allergic rhinitis Sister   . Mitral valve prolapse Son   . Alcohol abuse Son   . Diabetes Maternal Uncle   . Diabetes Maternal Grandmother   . Mitral valve prolapse Sister   . CAD Son 18       No details available  . Alcohol abuse Father   . Alcohol abuse Brother    Social History   Occupational History  . Not on file  Tobacco Use  . Smoking status: Current Every Day Smoker    Packs/day: 0.50    Years: 12.00    Pack years: 6.00    Types: Cigarettes  . Smokeless tobacco: Never Used  Vaping Use  . Vaping Use: Never used  Substance and Sexual Activity  . Alcohol use: No    Alcohol/week: 0.0 standard drinks  . Drug use: No  . Sexual activity: Not Currently    Birth control/protection: None    Tobacco Counseling Ready to quit: No Counseling given: Yes   Immunizations and Health Maintenance Immunization History  Administered Date(s) Administered  . Tdap 07/13/2017   Health Maintenance Due  Topic Date Due  . COVID-19 Vaccine (1) Never done  . MAMMOGRAM  Never done  . COLONOSCOPY  Never done  . PAP SMEAR-Modifier  08/01/2015    Activities of Daily Living In your present state of health, do you have any difficulty performing the following activities: 08/08/2019  Hearing? N  Vision? N  Difficulty concentrating or making decisions? N  Walking or climbing stairs? Y  Dressing or bathing? Y  Doing errands, shopping? Y  Preparing Food and eating ? Y  Using the Toilet? N   In the past six months, have you accidently leaked urine? N  Do you have problems with loss of bowel control? N  Managing your Medications? N  Managing your Finances? N  Housekeeping or managing your Housekeeping? Y  Some recent data might be hidden    Physical Exam   Heart-S1, S2, RRR Lungs-CTA b/l Breast exam-normal appearance of both breasts, no tenderness, no masses palpated, no lymphadenopathy Pelvic exam-external genitalia, vagina, cervix, adnexa-normal   Advanced Directives: Does Patient Have a Medical Advance Directive?: No    Assessment:  This is a routine wellness examination for this patient .   Vision/Hearing screen  Hearing Screening   125Hz  250Hz  500Hz  1000Hz  2000Hz  3000Hz  4000Hz  6000Hz  8000Hz   Right ear:           Left ear:             Visual Acuity Screening   Right eye Left eye Both eyes  Without correction: 20/70 20/70   With correction: 20/20 20/20     Dietary issues and exercise activities discussed:  Current Exercise Habits: The patient does not participate in regular exercise at present, Exercise limited by: None identified  Goals   None    Depression Screen PHQ 2/9 Scores 08/08/2019 06/18/2019 07/31/2018 07/14/2018  PHQ - 2 Score 0 0 0 0  PHQ- 9 Score 0 0 0 3     Fall Risk Fall Risk  08/08/2019  Falls in the past year? 1  Number falls in past yr: 0  Injury with Fall? 0    Cognitive Function:    Normal    Patient Care Team: , MD as PCP - General (Family Medicine) , MD as PCP - Cardiology (Cardiology)     Plan:    1. Encounter for Medicare annual wellness exam Counseled on 150 minutes of exercise per week, healthy eating (including decreased daily intake of saturated fats, cholesterol, added sugars, sodium),routine healthcare maintenance.   2. Colon cancer screening Declines colonoscopy - Cologuard  3. Encounter for screening mammogram for malignant neoplasm of breast - MM DIGITAL SCREENING  BILATERAL; Future - [facility] DIGITAL SCREENING BILATERAL; Future  4. Screening for cervical cancer - Cytology - PAP([place])  5. Tobacco use She has smoked about 16 pack years Lung cancer screening not indicated at this time Spent 3 minutes counseling on smoking cessation she is not ready to quit at this time but will be working on cutting back.   I have personally reviewed and noted the following in the patient's chart:   . Medical and social history . Use of alcohol, tobacco or illicit drugs  . Current medications and supplements . Functional ability and status . Nutritional status . Physical activity . Advanced directives . List of other physicians . Hospitalizations, surgeries, and ER visits in previous 12 months . Vitals . Screenings to include cognitive, depression, and falls . Referrals and appointments  In addition, I have reviewed and discussed with patient certain preventive protocols, quality metrics, and best practice recommendations. A written personalized care plan for preventive services as well as general preventive health recommendations were provided to patient.     , MD 08/08/2019

## 2019-08-10 ENCOUNTER — Encounter: Payer: Self-pay | Admitting: Family Medicine

## 2019-08-10 ENCOUNTER — Telehealth: Payer: Self-pay | Admitting: Family Medicine

## 2019-08-10 LAB — CYTOLOGY - PAP
Comment: NEGATIVE
Diagnosis: UNDETERMINED — AB
High risk HPV: NEGATIVE

## 2019-08-10 NOTE — Telephone Encounter (Signed)
Noted  

## 2019-08-10 NOTE — Telephone Encounter (Signed)
Pt states she has tried Lyrica, and that does not work for her. She has tried 600 mg gabapentin, and went as high as 1200 mg.  Pt states she will stay with the rOPINIRole (REQUIP XL) 2 MG 24 hr tablet.  Pt states with the GoodRx card she can get the med affordably.

## 2019-08-14 LAB — STREP PNEUMONIAE 23 SEROTYPES IGG
Pneumo Ab Type 1*: 1.2 ug/mL — ABNORMAL LOW (ref >1.3)
Pneumo Ab Type 12 (12F)*: <0.1 ug/mL — ABNORMAL LOW (ref >1.3)
Pneumo Ab Type 14*: 1.2 ug/mL — ABNORMAL LOW (ref >1.3)
Pneumo Ab Type 17 (17F)*: 0.3 ug/mL — ABNORMAL LOW (ref >1.3)
Pneumo Ab Type 19 (19F)*: 0.3 ug/mL — ABNORMAL LOW (ref >1.3)
Pneumo Ab Type 2*: 0.8 ug/mL — ABNORMAL LOW (ref >1.3)
Pneumo Ab Type 20*: 2.2 ug/mL (ref >1.3)
Pneumo Ab Type 22 (22F)*: <0.1 ug/mL — ABNORMAL LOW (ref >1.3)
Pneumo Ab Type 23 (23F)*: <0.1 ug/mL — ABNORMAL LOW (ref >1.3)
Pneumo Ab Type 26 (6B)*: <0.1 ug/mL — ABNORMAL LOW (ref >1.3)
Pneumo Ab Type 3*: 3 ug/mL (ref >1.3)
Pneumo Ab Type 34 (10A)*: 0.7 ug/mL — ABNORMAL LOW (ref >1.3)
Pneumo Ab Type 4*: 0.2 ug/mL — ABNORMAL LOW (ref >1.3)
Pneumo Ab Type 43 (11A)*: 0.1 ug/mL — ABNORMAL LOW (ref >1.3)
Pneumo Ab Type 5*: <0.1 ug/mL — ABNORMAL LOW (ref >1.3)
Pneumo Ab Type 51 (7F)*: 1.1 ug/mL — ABNORMAL LOW (ref >1.3)
Pneumo Ab Type 54 (15B)*: <0.1 ug/mL — ABNORMAL LOW (ref >1.3)
Pneumo Ab Type 56 (18C)*: 1 ug/mL — ABNORMAL LOW (ref >1.3)
Pneumo Ab Type 57 (19A)*: 4 ug/mL (ref >1.3)
Pneumo Ab Type 68 (9V)*: 0.3 ug/mL — ABNORMAL LOW (ref >1.3)
Pneumo Ab Type 70 (33F)*: 1.5 ug/mL (ref >1.3)
Pneumo Ab Type 8*: 0.5 ug/mL — ABNORMAL LOW (ref >1.3)
Pneumo Ab Type 9 (9N)*: 1.9 ug/mL (ref >1.3)

## 2019-08-14 LAB — CBC WITH DIFFERENTIAL/PLATELET
Basophils Absolute: 0 10*3/uL (ref 0.0–0.2)
Basos: 1 %
EOS (ABSOLUTE): 0.1 10*3/uL (ref 0.0–0.4)
Eos: 1 %
Hematocrit: 43.9 % (ref 34.0–46.6)
Hemoglobin: 14.7 g/dL (ref 11.1–15.9)
Immature Grans (Abs): 0 10*3/uL (ref 0.0–0.1)
Immature Granulocytes: 0 %
Lymphocytes Absolute: 1.6 10*3/uL (ref 0.7–3.1)
Lymphs: 28 %
MCH: 31.5 pg (ref 26.6–33.0)
MCHC: 33.5 g/dL (ref 31.5–35.7)
MCV: 94 fL (ref 79–97)
Monocytes Absolute: 0.6 10*3/uL (ref 0.1–0.9)
Monocytes: 10 %
Neutrophils Absolute: 3.5 10*3/uL (ref 1.4–7.0)
Neutrophils: 60 %
Platelets: 168 10*3/uL (ref 150–450)
RBC: 4.66 x10E6/uL (ref 3.77–5.28)
RDW: 12.9 % (ref 11.7–15.4)
WBC: 5.8 10*3/uL (ref 3.4–10.8)

## 2019-08-14 LAB — IGG, IGA, IGM
IgA/Immunoglobulin A, Serum: 246 mg/dL (ref 87–352)
IgG (Immunoglobin G), Serum: 805 mg/dL (ref 586–1602)
IgM (Immunoglobulin M), Srm: 121 mg/dL (ref 26–217)

## 2019-08-14 LAB — COMPLEMENT, TOTAL: Compl, Total (CH50): >60 U/mL (ref >41)

## 2019-08-14 LAB — DIPHTHERIA / TETANUS ANTIBODY PANEL
Diphtheria Ab: 1.13 IU/mL (ref <0.10)
Tetanus Ab, IgG: 6.49 IU/mL (ref <0.10)

## 2019-08-22 ENCOUNTER — Other Ambulatory Visit: Payer: Self-pay | Admitting: Family Medicine

## 2019-08-22 MED ORDER — CYCLOBENZAPRINE HCL 10 MG PO TABS: 10.0000 mg | ORAL_TABLET | Freq: Three times a day (TID) | ORAL | 0 refills | Status: DC | PRN

## 2019-08-23 ENCOUNTER — Ambulatory Visit
Admission: RE | Admit: 2019-08-23 | Discharge: 2019-08-23 | Disposition: A | Discharge status: Home / Self Care | Payer: Medicare Other | Source: Ambulatory Visit | Attending: Family Medicine | Admitting: Family Medicine

## 2019-08-23 ENCOUNTER — Other Ambulatory Visit: Payer: Self-pay

## 2019-08-23 DIAGNOSIS — Z1231 Encounter for screening mammogram for malignant neoplasm of breast: Secondary | ICD-10-CM

## 2019-08-31 LAB — COLOGUARD: Cologuard: NEGATIVE

## 2019-09-03 ENCOUNTER — Ambulatory Visit: Payer: Medicare Other | Admitting: Internal Medicine

## 2019-09-04 ENCOUNTER — Encounter: Payer: Self-pay | Admitting: Family Medicine

## 2019-09-04 ENCOUNTER — Other Ambulatory Visit: Payer: Self-pay

## 2019-09-04 ENCOUNTER — Ambulatory Visit: Payer: Medicare Other | Attending: Family Medicine | Admitting: Family Medicine

## 2019-09-04 VITALS — BP 104/69 | HR 95 | Ht 63.0 in | Wt 197.6 lb

## 2019-09-04 DIAGNOSIS — E0590 Thyrotoxicosis, unspecified without thyrotoxic crisis or storm: Secondary | ICD-10-CM | POA: Diagnosis not present

## 2019-09-04 DIAGNOSIS — G2581 Restless legs syndrome: Secondary | ICD-10-CM | POA: Diagnosis not present

## 2019-09-04 DIAGNOSIS — M5136 Other intervertebral disc degeneration, lumbar region: Secondary | ICD-10-CM

## 2019-09-04 DIAGNOSIS — G894 Chronic pain syndrome: Secondary | ICD-10-CM | POA: Diagnosis not present

## 2019-09-04 DIAGNOSIS — J4520 Mild intermittent asthma, uncomplicated: Secondary | ICD-10-CM

## 2019-09-04 MED ORDER — CYCLOBENZAPRINE HCL 10 MG PO TABS: 10.0000 mg | ORAL_TABLET | Freq: Three times a day (TID) | ORAL | 6 refills | Status: DC | PRN

## 2019-09-04 MED ORDER — ROPINIROLE HCL ER 2 MG PO TB24: 2.0000 mg | ORAL_TABLET | Freq: Every day | ORAL | 6 refills | Status: DC

## 2019-09-04 MED ORDER — GABAPENTIN 300 MG PO CAPS: 300.0000 mg | ORAL_CAPSULE | Freq: Every day | ORAL | 6 refills | Status: DC

## 2019-09-04 NOTE — Progress Notes (Signed)
Having pain in knees ankles and back.  Wants to discuss medications.

## 2019-09-04 NOTE — Patient Instructions (Signed)

## 2019-09-04 NOTE — Progress Notes (Signed)
Subjective:  Patient ID: Sylvia Hall, female    DOB: 01-18-1961  Age: 59 y.o. MRN: [id number]  CC: Pain   HPI Sylvia Hall is a 59year old female with a history of Asthma, chronic neck and lower back pain with associated radiculopathy (MRI of the c-spine and L spine from an outside hospital from 12/2006 revealed mild degenerative changes, annular bulges at L2-3,3-4,4-5 and neural foramina narrowing.C-spine reveals C3-4, 4-5 foraminal narrowing) seen for chronic disease management.  She has been in pain. Her R anterior thigh burns and she has no sensation. Her knees, ankle, neck, back hurt. Needs refill on Flexeril which has been beneficial and she is using Tylenol and Ibuprofen. Also takes Gabapentin. Her Gabapentin dose was previously increased by Dr Allena Katz however she was unable to tolerate it and developed pedal edema. Dr August Saucer gave her Cortisone injection in her R knee and she has a R knee brace for R knee symptoms Had one session of PT for her neck; unable to turn to left and so does not drive. Plans to call them back for more sessions.  Seen by Endocrine for her hyperthyroidisim and was unable to tolerate Tapazole due to dizziness and near syncope. Radioactive uptake scan ordered Her Asthma is stable [facility] with her last visit last month. Restless legs are controlled on Requip. She does have a lot of insect bites as she lives in a house with a large yard. Has also had a spider bite in the past.  Past Medical History:  Diagnosis Date  . Anxiety   . Arthritis   . Asthma   . Back pain, chronic   . Bilateral carpal tunnel syndrome   . Depression   . DVT (deep vein thrombosis) in pregnancy   . Environmental allergies   . Epilepsy (HCC)   . Fatigue   . GERD (gastroesophageal reflux disease)   . Headache    sinus  . History of kidney stones   . Mitral valve prolapse   . Pericarditis   . PTSD (post-traumatic stress disorder)   .  Recurrent upper respiratory infection (URI)   . Stenosis of lumbosacral spine    numbness L hand and L leg  . Thrombophlebitis    Age 11  . Urticaria     Past Surgical History:  Procedure Laterality Date  . ABDOMINAL HYSTERECTOMY     partial  . APPENDECTOMY    . BIOPSY BREAST     Right breast x 10  . BREAST LUMPECTOMY     Right breast  . CARPAL TUNNEL RELEASE Left 11/10/2017   Procedure: LEFT CARPAL TUNNEL RELEASE;  Surgeon: Cammy Copa, MD;  Location: [facility] OR;  Service: Orthopedics;  Laterality: Left;  . CESAREAN SECTION     x 2  . NOSE SURGERY     s/p trauma  . OVARY SURGERY    . RHINOPLASTY    . TUBAL LIGATION     left tubal  . UTERINE FIBROID SURGERY      Family History  Problem Relation Age of Onset  . Diabetes Mother   . Mitral valve prolapse Mother   . Asthma Mother   . CAD Mother 78  . Anxiety disorder Mother   . Mitral valve prolapse Sister   . Asthma Sister   . Allergic rhinitis Sister   . Mitral valve prolapse Son   . Alcohol abuse Son   . Diabetes Maternal Uncle   . Diabetes  Maternal Grandmother   . Mitral valve prolapse Sister   . CAD Son 51       No details available  . Alcohol abuse Father   . Alcohol abuse Brother     Allergies  Allergen Reactions  . Ivp Dye [Iodinated Diagnostic Agents] Hives  . Levofloxacin Hives  . Sulfa Drugs Cross Reactors Hives  . Dilantin [Phenytoin Sodium Extended] Hives and Itching  . Methimazole Other (See Comments)    dizziness  . Aspirin Other (See Comments)    Upset stomach, resolves with GERD meds   . Caffeine Other (See Comments)    Heart races  . Cymbalta [Duloxetine Hcl] Diarrhea and Nausea Only    Dizzy  . Penicillins Rash    Pt called in stating she  Was prescribed Pen VK in ED and developed a generalized raised rash.  She has taken amoxicillin and augmentin since this time and not had any problem  . Pregabalin Rash  . Sodium Nitrate Other (See Comments)    headaches    Outpatient  Medications Prior to Visit  Medication Sig Dispense Refill  . albuterol (PROVENTIL) (2.5 MG/3ML) 0.083% nebulizer solution Take 3 mLs (2.5 mg total) by nebulization every 6 (six) hours as needed for wheezing or shortness of breath. 150 mL 1  . Azelastine-Fluticasone 137-50 MCG/ACT SUSP Place 1 spray into the nose in the morning and at bedtime. 23 g 5  . Fluticasone-Umeclidin-Vilant (TRELEGY ELLIPTA) 100-62.5-25 MCG/INH AEPB Inhale 1 puff into the lungs daily. Rinse mouth after each use. 60 each 2  . levalbuterol (XOPENEX HFA) 45 MCG/ACT inhaler Inhale 2 puffs into the lungs every 6 (six) hours as needed for wheezing or shortness of breath (coughing fit). 1 Inhaler 2  . lidocaine (XYLOCAINE) 5 % ointment Apply 1 application topically as needed. 35.44 g 0  . metoprolol succinate (TOPROL-XL) 50 MG 24 hr tablet TAKE 1 TABLET BY MOUTH EVERY DAY IMMEDIATELY FOLLOWING A MEAL 90 tablet 1  . Olopatadine HCl 0.2 % SOLN Apply 1 drop to eye daily as needed (itchy/watery eyes). 2.5 mL 5  . pseudoephedrine-acetaminophen (TYLENOL SINUS) 30-500 MG TABS tablet Take 1 tablet by mouth every 4 (four) hours as needed.    . cyclobenzaprine (FLEXERIL) 10 MG tablet Take 1 tablet (10 mg total) by mouth 3 (three) times daily as needed for muscle spasms. 30 tablet 0  . gabapentin (NEURONTIN) 300 MG capsule Take 1 capsule (300 mg total) by mouth at bedtime. 30 capsule 6  . rOPINIRole (REQUIP XL) 2 MG 24 hr tablet Take 1 tablet (2 mg total) by mouth at bedtime. 30 tablet 6  . metoprolol tartrate (LOPRESSOR) 25 MG tablet Take 0.5 tablets (12.5 mg total) by mouth as needed. For palpitations 30 tablet 3   Facility-Administered Medications Prior to Visit  Medication Dose Route Frequency Provider Last Rate Last Admin  . methylPREDNISolone acetate (DEPO-MEDROL) injection 80 mg  80 mg Other Once Tyrell Antonio, MD         ROS Review of Systems  Constitutional: Negative for activity change, appetite change and fatigue.  HENT:  Negative for congestion, sinus pressure and sore throat.   Eyes: Negative for visual disturbance.  Respiratory: Negative for cough, chest tightness, shortness of breath and wheezing.   Cardiovascular: Negative for chest pain and palpitations.  Gastrointestinal: Negative for abdominal distention, abdominal pain and constipation.  Endocrine: Negative for polydipsia.  Genitourinary: Negative for dysuria and frequency.  Musculoskeletal:       See HPI  Skin:  Negative for rash.  Neurological: Negative for tremors, light-headedness and numbness.  Hematological: Does not bruise/bleed easily.  Psychiatric/Behavioral: Negative for agitation and behavioral problems.    Objective:  BP 104/69   Pulse 95   Ht 5\' 3"  (1.6 m)   Wt 197 lb 9.6 oz (89.6 kg)   SpO2 95%   BMI 35.00 kg/m   BP/Weight 09/04/2019 08/08/2019 08/06/2019  Systolic BP 104 99 114  Diastolic BP 69 66 70  Wt. (Lbs) 197.6 199 195.8  BMI 35 35.25 35.24  Some encounter information is confidential and restricted. Go to Review Flowsheets activity to see all data.      Physical Exam Constitutional:      Appearance: She is well-developed.  Neck:     Vascular: No JVD.     Comments: Reduced left lateral rotation of neck, normal right lateral rotation Cardiovascular:     Rate and Rhythm: Normal rate.     Heart sounds: Normal heart sounds. No murmur heard.   Pulmonary:     Effort: Pulmonary effort is normal.     Breath sounds: Normal breath sounds. No wheezing or rales.  Chest:     Chest wall: No tenderness.  Abdominal:     General: Bowel sounds are normal. There is no distension.     Palpations: Abdomen is soft. There is no mass.     Tenderness: There is no abdominal tenderness.  Musculoskeletal:        General: Tenderness (on palpation of left side of neck) present.     Right lower leg: No edema.     Left lower leg: No edema.  Neurological:     Mental Status: She is alert and oriented to person, place, and time.    Psychiatric:        Mood and Affect: Mood normal.     CMP Latest Ref Rng & Units 05/25/2019 06/25/2018 11/10/2017  Glucose 65 - 99 mg/dL 83 409(W105(H) 88  BUN 6 - 24 mg/dL 15 16 15   Creatinine 0.57 - 1.00 mg/dL 1.190.73 1.47(W1.12(H) 2.950.75  Sodium 134 - 144 mmol/L 140 137 140  Potassium 3.5 - 5.2 mmol/L 4.3 3.3(L) 3.8  Chloride 96 - 106 mmol/L 104 100 108  CO2 20 - 29 mmol/L 23 23 21(L)  Calcium 8.7 - 10.2 mg/dL 9.6 9.5 9.3  Total Protein 6.0 - 8.5 g/dL - - -  Total Bilirubin 0.0 - 1.2 mg/dL - - -  Alkaline Phos 39 - 117 IU/L - - -  AST 0 - 40 IU/L - - -  ALT 0 - 32 IU/L - - -    Lipid Panel     Component Value Date/Time   CHOL 214 (H) 05/19/2017 1021   TRIG 98 05/19/2017 1021   HDL 62 05/19/2017 1021   CHOLHDL 3.5 05/19/2017 1021   CHOLHDL 3.4 12/01/2013 1615   VLDL 23 12/01/2013 1615   LDLCALC 132 (H) 05/19/2017 1021    CBC    Component Value Date/Time   WBC 5.8 08/06/2019 1609   WBC 8.7 06/25/2018 2305   RBC 4.66 08/06/2019 1609   RBC 4.46 06/25/2018 2305   HGB 14.7 08/06/2019 1609   HCT 43.9 08/06/2019 1609   PLT 168 08/06/2019 1609   MCV 94 08/06/2019 1609   MCH 31.5 08/06/2019 1609   MCH 31.6 06/25/2018 2305   MCHC 33.5 08/06/2019 1609   MCHC 33.6 06/25/2018 2305   RDW 12.9 08/06/2019 1609   LYMPHSABS 1.6 08/06/2019 1609   MONOABS 0.7  06/25/2018 2305   EOSABS 0.1 08/06/2019 1609   BASOSABS 0.0 08/06/2019 1609    Lab Results  Component Value Date   HGBA1C 5.1 12/01/2013    Assessment & Plan:  1. Restless leg syndrome Controlled - rOPINIRole (REQUIP XL) 2 MG 24 hr tablet; Take 1 tablet (2 mg total) by mouth at bedtime.  Dispense: 30 tablet; Refill: 6  2. Hyperthyroidism Uncontrolled Unable to tolerate Tapazole Followed by Endocrine Awaiting radioactive uptake scan  3. Chronic pain syndrome Uncontrolled Advised to schedule PT appointment Discussed water aerobics, yoga, application of heat in chronic pain management - cyclobenzaprine (FLEXERIL) 10 MG  tablet; Take 1 tablet (10 mg total) by mouth 3 (three) times daily as needed for muscle spasms.  Dispense: 90 tablet; Refill: 6 - gabapentin (NEURONTIN) 300 MG capsule; Take 1 capsule (300 mg total) by mouth at bedtime.  Dispense: 30 capsule; Refill: 6  4. Degenerative disc disease, lumbar Uncontrolled See number 3 above  5. Mild intermittent asthma without complication Controlled Continue Trelegy and Xopenex [facility]  Meds ordered this encounter  Medications  . cyclobenzaprine (FLEXERIL) 10 MG tablet    Sig: Take 1 tablet (10 mg total) by mouth 3 (three) times daily as needed for muscle spasms.    Dispense:  90 tablet    Refill:  6  . gabapentin (NEURONTIN) 300 MG capsule    Sig: Take 1 capsule (300 mg total) by mouth at bedtime.    Dispense:  30 capsule    Refill:  6  . rOPINIRole (REQUIP XL) 2 MG 24 hr tablet    Sig: Take 1 tablet (2 mg total) by mouth at bedtime.    Dispense:  30 tablet    Refill:  6    Follow-up: Return in about 6 months (around 03/06/2020) for chronic disease management.       Hoy Register, MD, FAAFP. [facility] [place], [place] [phone]   09/04/2019, 2:51 PM

## 2019-09-27 NOTE — Progress Notes (Signed)
Cardiology Office Note   Date:  09/28/2019   ID:  Sylvia Hall, DOB [birth year], MRN [id number]  PCP:  Hoy Register, MD  Cardiologist:   Rollene Rotunda, MD   Chief Complaint  Patient presents with  . Palpitations      History of Present Illness: Sylvia Hall is a 59 y.o. female who presents for ongoing assessment and management of mitral valve prolapse and palpitations.  She had a monitor.  This was completed on 06/21/2019 which revealed occasional SVT brief runs.  She was treated with short acting metoprolol as needed for symptoms.  Of note she did have TSH completed which revealed a TSH low at 0.012 indicative of hyperthyroidism.  Free T3, and T4 was normal.  She did not tolerate methimazole.  She is now being managed for this.  She takes her metoprolol every day that she has been taking long-acting for about 30 years for her mitral valve prolapse.  She is having to take the as needed beta-blocker at half a pill to 3-4 times per week.  She is able to control her palpitations fairly much with this although sometimes the half pill does not work.  She is not having any presyncope or syncope.  She is not having any chest pressure, neck or arm discomfort.  She has had no weight gain or edema.   Past Medical History:  Diagnosis Date  . Anxiety   . Arthritis   . Asthma   . Back pain, chronic   . Bilateral carpal tunnel syndrome   . Depression   . DVT (deep vein thrombosis) in pregnancy   . Environmental allergies   . Epilepsy (HCC)   . Fatigue   . GERD (gastroesophageal reflux disease)   . Headache    sinus  . History of kidney stones   . Mitral valve prolapse   . Pericarditis   . PTSD (post-traumatic stress disorder)   . Recurrent upper respiratory infection (URI)   . Stenosis of lumbosacral spine    numbness L hand and L leg  . Thrombophlebitis    Age 3  . Urticaria     Past Surgical History:  Procedure Laterality Date  . ABDOMINAL HYSTERECTOMY     partial   . APPENDECTOMY    . BIOPSY BREAST     Right breast x 10  . BREAST LUMPECTOMY     Right breast  . CARPAL TUNNEL RELEASE Left 11/10/2017   Procedure: LEFT CARPAL TUNNEL RELEASE;  Surgeon: Cammy Copa, MD;  Location: [facility] OR;  Service: Orthopedics;  Laterality: Left;  . CESAREAN SECTION     x 2  . NOSE SURGERY     s/p trauma  . OVARY SURGERY    . RHINOPLASTY    . TUBAL LIGATION     left tubal  . UTERINE FIBROID SURGERY       Current Outpatient Medications  Medication Sig Dispense Refill  . albuterol (PROVENTIL) (2.5 MG/3ML) 0.083% nebulizer solution Take 3 mLs (2.5 mg total) by nebulization every 6 (six) hours as needed for wheezing or shortness of breath. 150 mL 1  . Azelastine-Fluticasone 137-50 MCG/ACT SUSP Place 1 spray into the nose in the morning and at bedtime. 23 g 5  . cyclobenzaprine (FLEXERIL) 10 MG tablet Take 1 tablet (10 mg total) by mouth 3 (three) times daily as needed for muscle spasms. 90 tablet 6  . Fluticasone-Umeclidin-Vilant (TRELEGY ELLIPTA) 100-62.5-25 MCG/INH AEPB Inhale 1 puff into the lungs daily. Rinse  mouth after each use. 60 each 2  . gabapentin (NEURONTIN) 300 MG capsule Take 1 capsule (300 mg total) by mouth at bedtime. 30 capsule 6  . levalbuterol (XOPENEX HFA) 45 MCG/ACT inhaler Inhale 2 puffs into the lungs every 6 (six) hours as needed for wheezing or shortness of breath (coughing fit). 1 Inhaler 2  . lidocaine (XYLOCAINE) 5 % ointment Apply 1 application topically as needed. 35.44 g 0  . metoprolol succinate (TOPROL-XL) 50 MG 24 hr tablet TAKE 1 TABLET BY MOUTH EVERY DAY IMMEDIATELY FOLLOWING A MEAL 90 tablet 1  . Olopatadine HCl 0.2 % SOLN Apply 1 drop to eye daily as needed (itchy/watery eyes). 2.5 mL 5  . pseudoephedrine-acetaminophen (TYLENOL SINUS) 30-500 MG TABS tablet Take 1 tablet by mouth every 4 (four) hours as needed.    Marland Kitchen rOPINIRole (REQUIP XL) 2 MG 24 hr tablet Take 1 tablet (2 mg total) by mouth at bedtime. 30 tablet 6  .  metoprolol tartrate (LOPRESSOR) 25 MG tablet Take 0.5 tablets (12.5 mg total) by mouth as needed. For palpitations 30 tablet 3   Current Facility-Administered Medications  Medication Dose Route Frequency Provider Last Rate Last Admin  . methylPREDNISolone acetate (DEPO-MEDROL) injection 80 mg  80 mg Other Once Tyrell Antonio, MD        Allergies:   Ivp dye [iodinated diagnostic agents], Levofloxacin, Sulfa drugs cross reactors, Dilantin [phenytoin sodium extended], Methimazole, Aspirin, Caffeine, Cymbalta [duloxetine hcl], Penicillins, Pregabalin, and Sodium nitrate    ROS:  Please see the history of present illness.   Otherwise, review of systems are positive for none.   All other systems are reviewed and negative.    PHYSICAL EXAM: VS:  BP 108/64   Pulse 88   Ht 5\' 3"  (1.6 m)   Wt 202 lb (91.6 kg)   SpO2 98%   BMI 35.78 kg/m  , BMI Body mass index is 35.78 kg/m. GENERAL:  Well appearing NECK:  No jugular venous distention, waveform within normal limits, carotid upstroke brisk and symmetric, no bruits, no thyromegaly LUNGS:  Clear to auscultation bilaterally CHEST:  Unremarkable HEART:  PMI not displaced or sustained,S1 and S2 within normal limits, no S3, no S4, no clicks, no rubs,no murmurs ABD:  Flat, positive bowel sounds normal in frequency in pitch, no bruits, no rebound, no guarding, no midline pulsatile mass, no hepatomegaly, no splenomegaly EXT:  2 plus pulses throughout, no edema, no cyanosis no clubbing   EKG:  EKG is not ordered today.    Recent Labs: 05/25/2019: BUN 15; Creatinine, Ser 0.73; Magnesium 1.8; Potassium 4.3; Sodium 140 06/18/2019: TSH 0.012 08/06/2019: Hemoglobin 14.7; Platelets 168    Lipid Panel    Component Value Date/Time   CHOL 214 (H) 05/19/2017 1021   TRIG 98 05/19/2017 1021   HDL 62 05/19/2017 1021   CHOLHDL 3.5 05/19/2017 1021   CHOLHDL 3.4 12/01/2013 1615   VLDL 23 12/01/2013 1615   LDLCALC 132 (H) 05/19/2017 1021      Wt  Readings from Last 3 Encounters:  09/28/19 202 lb (91.6 kg)  09/04/19 197 lb 9.6 oz (89.6 kg)  08/08/19 199 lb (90.3 kg)      Other studies Reviewed: Additional studies/ records that were reviewed today include: None. Review of the above records demonstrates:  Please see elsewhere in the note.     ASSESSMENT AND PLAN:  Palpitations:   We will continue her as needed beta-blocker and I told her that she could take more of this as  needed.  Eventually as she gets her thyroid corrected she may not need supplemental short acting beta-blocker.   Hyperthyroidism:    This is now being followed by endocrinology.  I will defer to their management.  Covid education: She has not wanted to get the vaccine but we had a long conversation about this.   Current medicines are reviewed at length with the patient today.  The patient does not have concerns regarding medicines.  The following changes have been made:  As above  Labs/ tests ordered today include: None No orders of the defined types were placed in this encounter.    Disposition:   FU with me in six months.  However if her palpitations are improved she can cancel this.   Signed, Rollene Rotunda, MD  09/28/2019 1:24 PM    [facility]

## 2019-09-28 ENCOUNTER — Ambulatory Visit (INDEPENDENT_AMBULATORY_CARE_PROVIDER_SITE_OTHER): Payer: Medicare Other | Admitting: Cardiology

## 2019-09-28 ENCOUNTER — Other Ambulatory Visit: Payer: Self-pay

## 2019-09-28 ENCOUNTER — Encounter: Payer: Self-pay | Admitting: Cardiology

## 2019-09-28 VITALS — BP 108/64 | HR 88 | Ht 63.0 in | Wt 202.0 lb

## 2019-09-28 DIAGNOSIS — R002 Palpitations: Secondary | ICD-10-CM | POA: Diagnosis not present

## 2019-09-28 NOTE — Patient Instructions (Signed)
Medication Instructions:  °No changes °*If you need a refill on your cardiac medications before your next appointment, please call your pharmacy* ° °Lab Work: °None ordered this visit ° °Testing/Procedures: °None ordered this visit ° °Follow-Up: °At [facility], you and your health needs are our priority.  As part of our continuing mission to provide you with exceptional heart care, we have created designated Provider Care Teams.  These Care Teams include your primary Cardiologist (physician) and Advanced Practice Providers (APPs -  Physician Assistants and Nurse Practitioners) who all work together to provide you with the care you need, when you need it. ° °Your next appointment:   °6 month(s)  °You will receive a reminder letter in the mail two months in advance. If you don't receive a letter, please call our office to schedule the follow-up appointment. ° °The format for your next appointment:   °In Person ° °Provider:   °James Hochrein, MD ° °

## 2019-10-08 ENCOUNTER — Ambulatory Visit: Payer: Medicare Other | Admitting: Allergy

## 2019-10-08 DIAGNOSIS — J3089 Other allergic rhinitis: Secondary | ICD-10-CM | POA: Insufficient documentation

## 2019-10-08 NOTE — Progress Notes (Deleted)
Follow Up Note  RE: Sylvia Hall MRN: [id number] DOB: 1960/05/16 Date of Office Visit: 10/08/2019  Referring provider: Hoy Register, MD Primary care provider: Hoy Register, MD  Chief Complaint: No chief complaint on file.  History of Present Illness: I had the pleasure of seeing Sylvia Hall for a [facility]. She is a 59 y.o. female, who is being followed for asthma/COPD overlap syndrome, allergic rhinoconjunctivitis and history of frequent upper respiratory infection. Her previous allergy office visit was on 08/06/2019 with Dr. Selena Batten. Today is a regular follow up visit.  Asthma-COPD overlap syndrome (HCC) Respiratory symptoms for 9 years. Currently on Advair 2 puff BID x 8 yrs and using albuterol 3-4 times a week with good benefit. Main triggers are allergies and exertion. 4 courses of prednisone the last year. Currently smoking 1 ppd x 17 years. Albuterol worsens her palpitations.  Today's spirometry showed: possible restrictive disease with no improvement in FEV1 post bronchodilator treatment but clinically feeling improved.  She most likely has asthma with COPD.  Discussed smoking cessation.  Daily controller medication(s):start Trelegy 1 puff once a day and rinse mouth after each use. Sample given.  ? Stop Advair for now.   May use levoalbuterol rescue inhaler 2 puffs every 4 to 6 hours as needed for shortness of breath, chest tightness, coughing, and wheezing. May use levoalbuterol rescue inhaler 2 puffs 5 to 15 minutes prior to strenuous physical activities. Monitor frequency of use.   Repeat spirometry at next visit.   Other allergic rhinitis Perennial rhinoconjunctivitis symptoms for 30+ years but worse since moved to [place] 9 years ago.  Tried Zyrtec, Claritin and Flonase with minimal benefit.  Frequent sinus infections.  Last skin testing in her 81s were positive to multiple items and was  on allergy immunotherapy for a few years with good benefit.  History of nasal fracture in her teens.  Today's skin testing showed: Positive to grass, dust mites, cat and dog.  Start environmental control measures as below.  May use over the counter antihistamines such as Zyrtec (cetirizine), Claritin (loratadine), Allegra (fexofenadine), or Xyzal (levocetirizine) daily as needed. May take twice a day if needed.   Start dymista (fluticasone + azelastine nasal spray combination) 1 spray per nostril twice a day.  This replaces Flonase (fluticasone) for now. If it's not covered let us know.   Nasal saline spray (i.e., Simply Saline) or nasal saline lavage (i.e., NeilMed) is recommended as needed and prior to medicated nasal sprays.  May use olopatadine eye drops 0.2% once a day as needed for itchy/watery eyes.  Allergic conjunctivitis of both eyes See assessment and plan as above for allergic rhinitis.  History of frequent upper respiratory infection History of frequent respiratory infections including sinusitis, bronchitis. No previous immune evaluation.  Keep track of infections.   Get bloodwork to look at immune system.   Recommend annual flu vaccine and COVID-19 vaccine.  Return in about 2 months (around 10/06/2019).  Assessment and Plan: Sylvia Hall is a 59 y.o. female with: No problem-specific Assessment & Plan notes found for this encounter.  No follow-ups on file.  No orders of the defined types were placed in this encounter.  Lab Orders  No laboratory test(s) ordered today    Diagnostics: Spirometry:  Tracings reviewed. Her effort: {Blank single:19197::"Good reproducible efforts.","It was hard to get consistent efforts and there is a question as to whether this reflects a maximal maneuver.","Poor effort,  data can not be interpreted."} FVC: ***L FEV1: ***L, ***% predicted FEV1/FVC ratio: ***% Interpretation: {Blank single:19197::"Spirometry consistent with mild  obstructive disease","Spirometry consistent with moderate obstructive disease","Spirometry consistent with severe obstructive disease","Spirometry consistent with possible restrictive disease","Spirometry consistent with mixed obstructive and restrictive disease","Spirometry uninterpretable due to technique","Spirometry consistent with normal pattern","No overt abnormalities noted given today's efforts"}.  Please see scanned spirometry results for details.  Skin Testing: {Blank single:19197::"Select foods","Environmental allergy panel","Environmental allergy panel and select foods","Food allergy panel","None","Deferred due to recent antihistamines use"}. Positive test to: ***. Negative test to: ***.  Results discussed with patient/family.   Medication List:  Current Outpatient Medications  Medication Sig Dispense Refill  . albuterol (PROVENTIL) (2.5 MG/3ML) 0.083% nebulizer solution Take 3 mLs (2.5 mg total) by nebulization every 6 (six) hours as needed for wheezing or shortness of breath. 150 mL 1  . Azelastine-Fluticasone 137-50 MCG/ACT SUSP Place 1 spray into the nose in the morning and at bedtime. 23 g 5  . cyclobenzaprine (FLEXERIL) 10 MG tablet Take 1 tablet (10 mg total) by mouth 3 (three) times daily as needed for muscle spasms. 90 tablet 6  . Fluticasone-Umeclidin-Vilant (TRELEGY ELLIPTA) 100-62.5-25 MCG/INH AEPB Inhale 1 puff into the lungs daily. Rinse mouth after each use. 60 each 2  . gabapentin (NEURONTIN) 300 MG capsule Take 1 capsule (300 mg total) by mouth at bedtime. 30 capsule 6  . levalbuterol (XOPENEX HFA) 45 MCG/ACT inhaler Inhale 2 puffs into the lungs every 6 (six) hours as needed for wheezing or shortness of breath (coughing fit). 1 Inhaler 2  . lidocaine (XYLOCAINE) 5 % ointment Apply 1 application topically as needed. 35.44 g 0  . metoprolol succinate (TOPROL-XL) 50 MG 24 hr tablet TAKE 1 TABLET BY MOUTH EVERY DAY IMMEDIATELY FOLLOWING A MEAL 90 tablet 1  . metoprolol  tartrate (LOPRESSOR) 25 MG tablet Take 0.5 tablets (12.5 mg total) by mouth as needed. For palpitations 30 tablet 3  . Olopatadine HCl 0.2 % SOLN Apply 1 drop to eye daily as needed (itchy/watery eyes). 2.5 mL 5  . pseudoephedrine-acetaminophen (TYLENOL SINUS) 30-500 MG TABS tablet Take 1 tablet by mouth every 4 (four) hours as needed.    Marland Kitchen rOPINIRole (REQUIP XL) 2 MG 24 hr tablet Take 1 tablet (2 mg total) by mouth at bedtime. 30 tablet 6   Current Facility-Administered Medications  Medication Dose Route Frequency Provider Last Rate Last Admin  . methylPREDNISolone acetate (DEPO-MEDROL) injection 80 mg  80 mg Other Once Tyrell Antonio, MD       Allergies: Allergies  Allergen Reactions  . Ivp Dye [Iodinated Diagnostic Agents] Hives  . Levofloxacin Hives  . Sulfa Drugs Cross Reactors Hives  . Dilantin [Phenytoin Sodium Extended] Hives and Itching  . Methimazole Other (See Comments)    dizziness  . Aspirin Other (See Comments)    Upset stomach, resolves with GERD meds   . Caffeine Other (See Comments)    Heart races  . Cymbalta [Duloxetine Hcl] Diarrhea and Nausea Only    Dizzy  . Penicillins Rash    Pt called in stating she  Was prescribed Pen VK in ED and developed a generalized raised rash.  She has taken amoxicillin and augmentin since this time and not had any problem  . Pregabalin Rash  . Sodium Nitrate Other (See Comments)    headaches   I reviewed her past medical history, social history, family history, and environmental history and no significant changes have been reported from her previous visit.  Review of  Systems  Constitutional: Negative for appetite change, chills, fever and unexpected weight change.  HENT: Positive for congestion and sneezing.   Eyes: Positive for itching.  Respiratory: Positive for cough. Negative for chest tightness, shortness of breath and wheezing.   Cardiovascular: Negative for chest pain.  Gastrointestinal: Negative for abdominal pain.    Genitourinary: Negative for difficulty urinating.  Skin: Negative for rash.  Allergic/Immunologic: Positive for environmental allergies.  Neurological: Positive for headaches.   Objective: There were no vitals taken for this visit. There is no height or weight on file to calculate BMI. Physical Exam Vitals and nursing note reviewed.  Constitutional:      Appearance: Normal appearance. She is well-developed.  HENT:     Head: Normocephalic and atraumatic.     Right Ear: Tympanic membrane and external ear normal.     Left Ear: External ear normal.     Nose: Nose normal.     Mouth/Throat:     Mouth: Mucous membranes are moist.     Pharynx: Oropharynx is clear.  Eyes:     Conjunctiva/sclera: Conjunctivae normal.  Cardiovascular:     Rate and Rhythm: Normal rate and regular rhythm.     Heart sounds: Normal heart sounds. No murmur heard.  No friction rub. No gallop.   Pulmonary:     Effort: Pulmonary effort is normal.     Breath sounds: Normal breath sounds. No wheezing, rhonchi or rales.  Musculoskeletal:     Cervical back: Neck supple.  Skin:    General: Skin is warm.     Findings: No rash.  Neurological:     Mental Status: She is alert and oriented to person, place, and time.  Psychiatric:        Behavior: Behavior normal.    Previous notes and tests were reviewed. The plan was reviewed with the patient/family, and all questions/concerned were addressed.  It was my pleasure to see Sylvia Hall today and participate in her care. Please feel free to contact me with any questions or concerns.  Sincerely,  Wyline Mood, DO Allergy & Immunology  [facility] office: [phone] [facility] office: [phone] [place] office: [phone]

## 2019-10-12 ENCOUNTER — Encounter: Payer: Self-pay | Admitting: Internal Medicine

## 2019-10-12 ENCOUNTER — Ambulatory Visit (INDEPENDENT_AMBULATORY_CARE_PROVIDER_SITE_OTHER): Payer: Medicare Other | Admitting: Internal Medicine

## 2019-10-12 ENCOUNTER — Other Ambulatory Visit: Payer: Self-pay

## 2019-10-12 VITALS — BP 122/80 | HR 105 | Ht 63.0 in | Wt 204.0 lb

## 2019-10-12 DIAGNOSIS — E0590 Thyrotoxicosis, unspecified without thyrotoxic crisis or storm: Secondary | ICD-10-CM | POA: Diagnosis not present

## 2019-10-12 LAB — TSH: TSH: 0.74 u[IU]/mL (ref 0.35–4.50)

## 2019-10-12 LAB — T4, FREE: Free T4: 0.68 ng/dL (ref 0.60–1.60)

## 2019-10-12 NOTE — Progress Notes (Signed)
Name: Sylvia Hall  MRN/ DOB: [id number], 02-Mar-1960    Age/ Sex: 59 y.o., female     PCP: Hoy Register, MD   Reason for Endocrinology Evaluation: Hyperthyroidism      Initial Endocrinology Clinic Visit: 07/10/2019    PATIENT IDENTIFIER: [name] is a 59 y.o., female with a past medical history of Asthma, hx of radiculopathy , Hyperlipidemia , MVP and RLS. She has followed with [facility] since 07/10/2019 for consultative assistance with management of her subclinical hyperthyroidism   HISTORICAL SUMMARY:  Pt has been noted to have a suppressed TSH 05/2019 at 0.008 uIU/mL , during evaluation for palpitations.   Her metoprolol was adjusted   Repeat labs in 06/2019 confirmed  Suppressed TSH at 0.012 uIU/mL with normal FT4 and T3 and was started on Methimazole at the time but developed severe dizziness.   Thyroid uptake and scan 07/2019 showed a 24 hour I-123 uptake = 27.5% (normal 10-30%)     Other than her mitral valve prolapse she has no diagnosis of arrhythmia , but no osteoporosis , has hx of ankle and finger fracture while playing sports but nothing in the recent years.    Mother with thyroid disease ( hyperthyroidism) SUBJECTIVE:    Today (10/12/2019):  Sylvia Hall is here for a follow up on subclinical hyperthyroidism.  Weight has been stable  Continue with palpitations, she is on Lopressor as needed Has been thirsty.  Denies diarrhea , has diarrhea  Has chronic anxiety  Has insomnia   Denies local neck symptoms.       HISTORY:  Past Medical History:  Past Medical History:  Diagnosis Date  . Anxiety   . Arthritis   . Asthma   . Back pain, chronic   . Bilateral carpal tunnel syndrome   . Depression   . DVT (deep vein thrombosis) in pregnancy   . Environmental allergies   . Epilepsy (HCC)   . Fatigue   . GERD (gastroesophageal reflux disease)   . Headache    sinus  . History of kidney stones   . Mitral valve prolapse   .  Pericarditis   . PTSD (post-traumatic stress disorder)   . Recurrent upper respiratory infection (URI)   . Stenosis of lumbosacral spine    numbness L hand and L leg  . Thrombophlebitis    Age 45  . Urticaria    Past Surgical History:  Past Surgical History:  Procedure Laterality Date  . ABDOMINAL HYSTERECTOMY     partial  . APPENDECTOMY    . BIOPSY BREAST     Right breast x 10  . BREAST LUMPECTOMY     Right breast  . CARPAL TUNNEL RELEASE Left 11/10/2017   Procedure: LEFT CARPAL TUNNEL RELEASE;  Surgeon: Cammy Copa, MD;  Location: [facility] OR;  Service: Orthopedics;  Laterality: Left;  . CESAREAN SECTION     x 2  . NOSE SURGERY     s/p trauma  . OVARY SURGERY    . RHINOPLASTY    . TUBAL LIGATION     left tubal  . UTERINE FIBROID SURGERY      Social History:  reports that she has been smoking cigarettes. She has a 6.00 pack-year smoking history. She has never used smokeless tobacco. She reports that she does not drink alcohol and does not use drugs. Family History:  Family History  Problem Relation Age of Onset  . Diabetes Mother   . Mitral valve prolapse Mother   .  Asthma Mother   . CAD Mother 22  . Anxiety disorder Mother   . Mitral valve prolapse Sister   . Asthma Sister   . Allergic rhinitis Sister   . Mitral valve prolapse Son   . Alcohol abuse Son   . Diabetes Maternal Uncle   . Diabetes Maternal Grandmother   . Mitral valve prolapse Sister   . CAD Son 71       No details available  . Alcohol abuse Father   . Alcohol abuse Brother      HOME MEDICATIONS: Allergies as of 10/12/2019      Reactions   Ivp Dye [iodinated Diagnostic Agents] Hives   Levofloxacin Hives   Sulfa Drugs Cross Reactors Hives   Dilantin [phenytoin Sodium Extended] Hives, Itching   Methimazole Other (See Comments)   dizziness   Aspirin Other (See Comments)   Upset stomach, resolves with GERD meds   Caffeine Other (See Comments)   Heart races   Cymbalta [duloxetine Hcl]  Diarrhea, Nausea Only   Dizzy   Penicillins Rash   Pt called in stating she  Was prescribed Pen VK in ED and developed a generalized raised rash.  She has taken amoxicillin and augmentin since this time and not had any problem   Pregabalin Rash   Sodium Nitrate Other (See Comments)   headaches      Medication List       Accurate as of October 12, 2019  1:51 PM. If you have any questions, ask your nurse or doctor.        albuterol (2.5 MG/3ML) 0.083% nebulizer solution Commonly known as: PROVENTIL Take 3 mLs (2.5 mg total) by nebulization every 6 (six) hours as needed for wheezing or shortness of breath.   Azelastine-Fluticasone 137-50 MCG/ACT Susp Place 1 spray into the nose in the morning and at bedtime.   cyclobenzaprine 10 MG tablet Commonly known as: FLEXERIL Take 1 tablet (10 mg total) by mouth 3 (three) times daily as needed for muscle spasms.   gabapentin 300 MG capsule Commonly known as: NEURONTIN Take 1 capsule (300 mg total) by mouth at bedtime.   levalbuterol 45 MCG/ACT inhaler Commonly known as: XOPENEX HFA Inhale 2 puffs into the lungs every 6 (six) hours as needed for wheezing or shortness of breath (coughing fit).   lidocaine 5 % ointment Commonly known as: XYLOCAINE Apply 1 application topically as needed.   metoprolol succinate 50 MG 24 hr tablet Commonly known as: TOPROL-XL TAKE 1 TABLET BY MOUTH EVERY DAY IMMEDIATELY FOLLOWING A MEAL   metoprolol tartrate 25 MG tablet Commonly known as: LOPRESSOR Take 0.5 tablets (12.5 mg total) by mouth as needed. For palpitations   Olopatadine HCl 0.2 % Soln Apply 1 drop to eye daily as needed (itchy/watery eyes).   pseudoephedrine-acetaminophen 30-500 MG Tabs tablet Commonly known as: TYLENOL SINUS Take 1 tablet by mouth every 4 (four) hours as needed.   rOPINIRole 2 MG 24 hr tablet Commonly known as: REQUIP XL Take 1 tablet (2 mg total) by mouth at bedtime.   Trelegy Ellipta 100-62.5-25 MCG/INH  Aepb Generic drug: Fluticasone-Umeclidin-Vilant Inhale 1 puff into the lungs daily. Rinse mouth after each use.         OBJECTIVE:   PHYSICAL EXAM: VS: BP 122/80 (BP Location: Left Arm, Patient Position: Sitting, Cuff Size: Normal)   Pulse (!) 105   Ht 5\' 3"  (1.6 m)   Wt 204 lb (92.5 kg)   SpO2 95%   BMI 36.14 kg/m  EXAM: General: Pt appears well and is in NAD  Neck: General: Supple without adenopathy. Thyroid: Thyroid size normal.  No goiter or nodules appreciated. No thyroid bruit.  Lungs: Clear with good BS bilat with no rales, rhonchi, or wheezes  Heart: Auscultation: RRR.  Abdomen: Normoactive bowel sounds, soft, nontender, without masses or organomegaly palpable  Extremities:  BL LE: No pretibial edema normal ROM and strength.  Mental Status: Judgment, insight: Intact Orientation: Oriented to time, place, and person Memory: Intact for recent and remote events Mood and affect: No depression, anxiety, or agitation     DATA REVIEWED:  Results for ZAAKIRAH, KISTNER (MRN [id number]) as of 10/15/2019 14:59  Ref. Range 10/12/2019 13:59  TSH Latest Ref Range: 0.35 - 4.50 uIU/mL 0.74  T4,Free(Direct) Latest Ref Range: 0.60 - 1.60 ng/dL 9.67   Results for KEANU, FRICKEY (MRN [id number]) as of 10/15/2019 15:12  Ref. Range 10/12/2019 13:59  TRAB Latest Ref Range: <=2.00 IU/L 1.51    ASSESSMENT / PLAN / RECOMMENDATIONS:   1. Subclinical Hyperthyroidism:  - Pt is clinically euthyroid - No local neck symptoms  - Uptake and scan showed normal uptake  27.5% (07/2019) with heterogenous picture.  - She developed dizziness to Methimazole, we discussed PTU as a second line option as well. Pt opted to retry methimazole if needed.  - Repeat labs are normal, no intervention is needed.    F/U in 4 months   Signed electronically by: Lyndle Herrlich, MD  [facility]  [facility] [address] [place], [place] [address] Phone:  [phone] FAX: [phone]      CC: Hoy Register, MD [address] [place] [address] Phone: [phone]  Fax: [phone]   Return to Endocrinology clinic as below: Future Appointments  Date Time Provider Department Center  10/31/2019  3:15 PM Ellamae Sia, DO [facility] None

## 2019-10-14 LAB — TRAB (TSH RECEPTOR BINDING ANTIBODY): TRAB: 1.51 IU/L (ref <=2.00)

## 2019-10-30 NOTE — Progress Notes (Deleted)
Follow Up Note  RE: Sylvia Hall MRN: [id number] DOB: Nov 04, 1960 Date of Office Visit: 10/31/2019  Referring provider: Hoy Register, MD Primary care provider: Hoy Register, MD  Chief Complaint: No chief complaint on file.  History of Present Illness: I had the pleasure of seeing Sylvia Hall for a follow up visit at the [facility] on 10/30/2019. She is a 59 y.o. female, who is being followed for asthma/COPD overlap syndrome, allergic rhinoconjunctivitis and history of frequent upper respiratory infection. Her previous allergy office visit was on 08/06/2019 with Dr. Selena Hall. Today is a regular follow up visit.  Asthma-COPD overlap syndrome (HCC) Respiratory symptoms for 9 years. Currently on Advair 2 puff BID x 8 yrs and using albuterol 3-4 times a week with good benefit. Main triggers are allergies and exertion. 4 courses of prednisone the last year. Currently smoking 1 ppd x 17 years. Albuterol worsens her palpitations.  Today's spirometry showed: possible restrictive disease with no improvement in FEV1 post bronchodilator treatment but clinically feeling improved.  She most likely has asthma with COPD.  Discussed smoking cessation.  Daily controller medication(s):start Trelegy 1 puff once a day and rinse mouth after each use. Sample given.  ? Stop Advair for now.   May use levoalbuterol rescue inhaler 2 puffs every 4 to 6 hours as needed for shortness of breath, chest tightness, coughing, and wheezing. May use levoalbuterol rescue inhaler 2 puffs 5 to 15 minutes prior to strenuous physical activities. Monitor frequency of use.   Repeat spirometry at next visit.   Other allergic rhinitis Perennial rhinoconjunctivitis symptoms for 30+ years but worse since moved to [place] 9 years ago.  Tried Zyrtec, Claritin and Flonase with minimal benefit.  Frequent sinus infections.  Last skin testing in her 58s were positive to multiple items and was  on allergy immunotherapy for a few years with good benefit.  History of nasal fracture in her teens.  Today's skin testing showed: Positive to grass, dust mites, cat and dog.  Start environmental control measures as below.  May use over the counter antihistamines such as Zyrtec (cetirizine), Claritin (loratadine), Allegra (fexofenadine), or Xyzal (levocetirizine) daily as needed. May take twice a day if needed.   Start dymista (fluticasone + azelastine nasal spray combination) 1 spray per nostril twice a day.  This replaces Flonase (fluticasone) for now. If it's not covered let us know.   Nasal saline spray (i.e., Simply Saline) or nasal saline lavage (i.e., NeilMed) is recommended as needed and prior to medicated nasal sprays.  May use olopatadine eye drops 0.2% once a day as needed for itchy/watery eyes.  Allergic conjunctivitis of both eyes See assessment and plan as above for allergic rhinitis.  History of frequent upper respiratory infection History of frequent respiratory infections including sinusitis, bronchitis. No previous immune evaluation.  Keep track of infections.   Get bloodwork to look at immune system.   Recommend annual flu vaccine and COVID-19 vaccine.  Return in about 2 months (around 10/06/2019).  Assessment and Plan: Sylvia Hall is a 59 y.o. female with: No problem-specific Assessment & Plan notes found for this encounter.  No follow-ups on file.  No orders of the defined types were placed in this encounter.  Lab Orders  No laboratory test(s) ordered today    Diagnostics: Spirometry:  Tracings reviewed. Her effort: {Blank single:19197::"Good reproducible efforts.","It was hard to get consistent efforts and there is a question as to whether this reflects a maximal maneuver.","Poor effort,  data can not be interpreted."} FVC: ***L FEV1: ***L, ***% predicted FEV1/FVC ratio: ***% Interpretation: {Blank single:19197::"Spirometry consistent with mild  obstructive disease","Spirometry consistent with moderate obstructive disease","Spirometry consistent with severe obstructive disease","Spirometry consistent with possible restrictive disease","Spirometry consistent with mixed obstructive and restrictive disease","Spirometry uninterpretable due to technique","Spirometry consistent with normal pattern","No overt abnormalities noted given today's efforts"}.  Please see scanned spirometry results for details.  Skin Testing: {Blank single:19197::"Select foods","Environmental allergy panel","Environmental allergy panel and select foods","Food allergy panel","None","Deferred due to recent antihistamines use"}. Positive test to: ***. Negative test to: ***.  Results discussed with patient/family.   Medication List:  Current Outpatient Medications  Medication Sig Dispense Refill  . albuterol (PROVENTIL) (2.5 MG/3ML) 0.083% nebulizer solution Take 3 mLs (2.5 mg total) by nebulization every 6 (six) hours as needed for wheezing or shortness of breath. 150 mL 1  . Azelastine-Fluticasone 137-50 MCG/ACT SUSP Place 1 spray into the nose in the morning and at bedtime. 23 g 5  . cyclobenzaprine (FLEXERIL) 10 MG tablet Take 1 tablet (10 mg total) by mouth 3 (three) times daily as needed for muscle spasms. 90 tablet 6  . Fluticasone-Umeclidin-Vilant (TRELEGY ELLIPTA) 100-62.5-25 MCG/INH AEPB Inhale 1 puff into the lungs daily. Rinse mouth after each use. 60 each 2  . gabapentin (NEURONTIN) 300 MG capsule Take 1 capsule (300 mg total) by mouth at bedtime. 30 capsule 6  . levalbuterol (XOPENEX HFA) 45 MCG/ACT inhaler Inhale 2 puffs into the lungs every 6 (six) hours as needed for wheezing or shortness of breath (coughing fit). 1 Inhaler 2  . lidocaine (XYLOCAINE) 5 % ointment Apply 1 application topically as needed. 35.44 g 0  . metoprolol succinate (TOPROL-XL) 50 MG 24 hr tablet TAKE 1 TABLET BY MOUTH EVERY DAY IMMEDIATELY FOLLOWING A MEAL 90 tablet 1  . metoprolol  tartrate (LOPRESSOR) 25 MG tablet Take 0.5 tablets (12.5 mg total) by mouth as needed. For palpitations 30 tablet 3  . Olopatadine HCl 0.2 % SOLN Apply 1 drop to eye daily as needed (itchy/watery eyes). 2.5 mL 5  . pseudoephedrine-acetaminophen (TYLENOL SINUS) 30-500 MG TABS tablet Take 1 tablet by mouth every 4 (four) hours as needed.    Marland Kitchen rOPINIRole (REQUIP XL) 2 MG 24 hr tablet Take 1 tablet (2 mg total) by mouth at bedtime. 30 tablet 6   Current Facility-Administered Medications  Medication Dose Route Frequency Provider Last Rate Last Admin  . methylPREDNISolone acetate (DEPO-MEDROL) injection 80 mg  80 mg Other Once Tyrell Antonio, MD       Allergies: Allergies  Allergen Reactions  . Ivp Dye [Iodinated Diagnostic Agents] Hives  . Levofloxacin Hives  . Sulfa Drugs Cross Reactors Hives  . Dilantin [Phenytoin Sodium Extended] Hives and Itching  . Methimazole Other (See Comments)    dizziness  . Aspirin Other (See Comments)    Upset stomach, resolves with GERD meds   . Caffeine Other (See Comments)    Heart races  . Cymbalta [Duloxetine Hcl] Diarrhea and Nausea Only    Dizzy  . Penicillins Rash    Pt called in stating she  Was prescribed Pen VK in ED and developed a generalized raised rash.  She has taken amoxicillin and augmentin since this time and not had any problem  . Pregabalin Rash  . Sodium Nitrate Other (See Comments)    headaches   I reviewed her past medical history, social history, family history, and environmental history and no significant changes have been reported from her previous visit.  Review of  Systems  Constitutional: Negative for appetite change, chills, fever and unexpected weight change.  HENT: Positive for congestion and sneezing.   Eyes: Positive for itching.  Respiratory: Positive for cough. Negative for chest tightness, shortness of breath and wheezing.   Cardiovascular: Negative for chest pain.  Gastrointestinal: Negative for abdominal pain.    Genitourinary: Negative for difficulty urinating.  Skin: Negative for rash.  Allergic/Immunologic: Positive for environmental allergies.  Neurological: Positive for headaches.   Objective: There were no vitals taken for this visit. There is no height or weight on file to calculate BMI. Physical Exam Vitals and nursing note reviewed.  Constitutional:      Appearance: Normal appearance. She is well-developed.  HENT:     Head: Normocephalic and atraumatic.     Right Ear: Tympanic membrane and external ear normal.     Left Ear: External ear normal.     Nose: Nose normal.     Mouth/Throat:     Mouth: Mucous membranes are moist.     Pharynx: Oropharynx is clear.  Eyes:     Conjunctiva/sclera: Conjunctivae normal.  Cardiovascular:     Rate and Rhythm: Normal rate and regular rhythm.     Heart sounds: Normal heart sounds. No murmur heard.  No friction rub. No gallop.   Pulmonary:     Effort: Pulmonary effort is normal.     Breath sounds: Normal breath sounds. No wheezing, rhonchi or rales.  Musculoskeletal:     Cervical back: Neck supple.  Skin:    General: Skin is warm.     Findings: No rash.  Neurological:     Mental Status: She is alert and oriented to person, place, and time.  Psychiatric:        Behavior: Behavior normal.    Previous notes and tests were reviewed. The plan was reviewed with the patient/family, and all questions/concerned were addressed.  It was my pleasure to see Sylvia Hall today and participate in her care. Please feel free to contact me with any questions or concerns.  Sincerely,  Wyline Mood, DO Allergy & Immunology  [facility] office: [phone] [facility] office: [phone] [place] office: [phone]

## 2019-10-31 ENCOUNTER — Ambulatory Visit: Payer: Medicare Other | Admitting: Allergy

## 2020-01-30 ENCOUNTER — Telehealth: Payer: Self-pay | Admitting: Cardiology

## 2020-01-30 MED ORDER — METOPROLOL SUCCINATE ER 50 MG PO TB24: ORAL_TABLET | ORAL | 2 refills | Status: DC

## 2020-01-30 NOTE — Telephone Encounter (Signed)
*  STAT* If patient is at the pharmacy, call can be transferred to refill team.   1. Which medications need to be refilled? (please list name of each medication and dose if known)  metoprolol succinate (TOPROL-XL) 50 MG 24 hr tablet  2. Which pharmacy/location (including street and city if local pharmacy) is medication to be sent to? [id number] [place], [place] - [address]  3. Do they need a 30 day or 90 day supply? 90 with refills

## 2020-01-30 NOTE — Telephone Encounter (Signed)
rx sent to pharmacy

## 2020-02-11 ENCOUNTER — Encounter: Payer: Self-pay | Admitting: Family Medicine

## 2020-02-12 ENCOUNTER — Other Ambulatory Visit: Payer: Self-pay | Admitting: Family Medicine

## 2020-02-12 MED ORDER — AMOXICILLIN-POT CLAVULANATE 875-125 MG PO TABS
1.0000 | ORAL_TABLET | Freq: Two times a day (BID) | ORAL | 0 refills | Status: DC
Start: 2020-02-12 — End: 2020-04-11

## 2020-02-22 ENCOUNTER — Ambulatory Visit: Payer: Medicare Other | Admitting: Internal Medicine

## 2020-03-27 NOTE — Progress Notes (Deleted)
Cardiology Office Note   Date:  03/27/2020   ID:  Sylvia Hall, DOB 31-May-1960, MRN [id number]  PCP:  Hoy Register, MD  Cardiologist:   Rollene Rotunda, MD   No chief complaint on file.     History of Present Illness: Sylvia Hall is a 60 y.o. female who presents for ongoing assessment and management of mitral valve prolapse and palpitations.  She had a monitor.  This was completed on 06/21/2019 which revealed occasional SVT brief runs.  She was treated with short acting metoprolol as needed for symptoms.  Of note she did have TSH completed which revealed a TSH low at 0.012 indicative of hyperthyroidism.  Free T3, and T4 was normal.  She did not tolerate methimazole.  She presents for follow up.  ***  ***  She is now being managed for this.  She takes her metoprolol every day that she has been taking long-acting for about 30 years for her mitral valve prolapse.  She is having to take the as needed beta-blocker at half a pill to 3-4 times per week.  She is able to control her palpitations fairly much with this although sometimes the half pill does not work.  She is not having any presyncope or syncope.  She is not having any chest pressure, neck or arm discomfort.  She has had no weight gain or edema.   Past Medical History:  Diagnosis Date  . Anxiety   . Arthritis   . Asthma   . Back pain, chronic   . Bilateral carpal tunnel syndrome   . Depression   . DVT (deep vein thrombosis) in pregnancy   . Environmental allergies   . Epilepsy (HCC)   . Fatigue   . GERD (gastroesophageal reflux disease)   . Headache    sinus  . History of kidney stones   . Mitral valve prolapse   . Pericarditis   . PTSD (post-traumatic stress disorder)   . Recurrent upper respiratory infection (URI)   . Stenosis of lumbosacral spine    numbness L hand and L leg  . Thrombophlebitis    Age 25  . Urticaria     Past Surgical History:  Procedure Laterality Date  . ABDOMINAL HYSTERECTOMY      partial  . APPENDECTOMY    . BIOPSY BREAST     Right breast x 10  . BREAST LUMPECTOMY     Right breast  . CARPAL TUNNEL RELEASE Left 11/10/2017   Procedure: LEFT CARPAL TUNNEL RELEASE;  Surgeon: Cammy Copa, MD;  Location: [facility];  Service: Orthopedics;  Laterality: Left;  . CESAREAN SECTION     x 2  . NOSE SURGERY     s/p trauma  . OVARY SURGERY    . RHINOPLASTY    . TUBAL LIGATION     left tubal  . UTERINE FIBROID SURGERY       Current Outpatient Medications  Medication Sig Dispense Refill  . albuterol (PROVENTIL) (2.5 MG/3ML) 0.083% nebulizer solution Take 3 mLs (2.5 mg total) by nebulization every 6 (six) hours as needed for wheezing or shortness of breath. 150 mL 1  . amoxicillin-clavulanate (AUGMENTIN) 875-125 MG tablet Take 1 tablet by mouth 2 (two) times daily. 20 tablet 0  . Azelastine-Fluticasone 137-50 MCG/ACT SUSP Place 1 spray into the nose in the morning and at bedtime. 23 g 5  . cyclobenzaprine (FLEXERIL) 10 MG tablet Take 1 tablet (10 mg total) by mouth 3 (three) times daily  as needed for muscle spasms. 90 tablet 6  . Fluticasone-Umeclidin-Vilant (TRELEGY ELLIPTA) 100-62.5-25 MCG/INH AEPB Inhale 1 puff into the lungs daily. Rinse mouth after each use. 60 each 2  . gabapentin (NEURONTIN) 300 MG capsule Take 1 capsule (300 mg total) by mouth at bedtime. 30 capsule 6  . levalbuterol (XOPENEX HFA) 45 MCG/ACT inhaler Inhale 2 puffs into the lungs every 6 (six) hours as needed for wheezing or shortness of breath (coughing fit). 1 Inhaler 2  . lidocaine (XYLOCAINE) 5 % ointment Apply 1 application topically as needed. 35.44 g 0  . metoprolol succinate (TOPROL-XL) 50 MG 24 hr tablet TAKE 1 TABLET BY MOUTH EVERY DAY IMMEDIATELY FOLLOWING A MEAL 90 tablet 2  . metoprolol tartrate (LOPRESSOR) 25 MG tablet Take 0.5 tablets (12.5 mg total) by mouth as needed. For palpitations 30 tablet 3  . Olopatadine HCl 0.2 % SOLN Apply 1 drop to eye daily as needed (itchy/watery eyes).  2.5 mL 5  . pseudoephedrine-acetaminophen (TYLENOL SINUS) 30-500 MG TABS tablet Take 1 tablet by mouth every 4 (four) hours as needed.    Marland Kitchen rOPINIRole (REQUIP XL) 2 MG 24 hr tablet Take 1 tablet (2 mg total) by mouth at bedtime. 30 tablet 6   Current Facility-Administered Medications  Medication Dose Route Frequency Provider Last Rate Last Admin  . methylPREDNISolone acetate (DEPO-MEDROL) injection 80 mg  80 mg Other Once Tyrell Antonio, MD        Allergies:   Ivp dye [iodinated diagnostic agents], Levofloxacin, Sulfa drugs cross reactors, Dilantin [phenytoin sodium extended], Methimazole, Aspirin, Caffeine, Cymbalta [duloxetine hcl], Penicillins, Pregabalin, and Sodium nitrate    ROS:  Please see the history of present illness.   Otherwise, review of systems are positive for none.   All other systems are reviewed and negative.    PHYSICAL EXAM: VS:  There were no vitals taken for this visit. , BMI There is no height or weight on file to calculate BMI. GENERAL:  Well appearing NECK:  No jugular venous distention, waveform within normal limits, carotid upstroke brisk and symmetric, no bruits, no thyromegaly LUNGS:  Clear to auscultation bilaterally CHEST:  Unremarkable HEART:  PMI not displaced or sustained,S1 and S2 within normal limits, no S3, no S4, no clicks, no rubs,no murmurs ABD:  Flat, positive bowel sounds normal in frequency in pitch, no bruits, no rebound, no guarding, no midline pulsatile mass, no hepatomegaly, no splenomegaly EXT:  2 plus pulses throughout, no edema, no cyanosis no clubbing   EKG:  EKG *** ordered today. ***   Recent Labs: 05/25/2019: BUN 15; Creatinine, Ser 0.73; Magnesium 1.8; Potassium 4.3; Sodium 140 08/06/2019: Hemoglobin 14.7; Platelets 168 10/12/2019: TSH 0.74    Lipid Panel    Component Value Date/Time   CHOL 214 (H) 05/19/2017 1021   TRIG 98 05/19/2017 1021   HDL 62 05/19/2017 1021   CHOLHDL 3.5 05/19/2017 1021   CHOLHDL 3.4 12/01/2013  1615   VLDL 23 12/01/2013 1615   LDLCALC 132 (H) 05/19/2017 1021      Wt Readings from Last 3 Encounters:  10/12/19 204 lb (92.5 kg)  09/28/19 202 lb (91.6 kg)  09/04/19 197 lb 9.6 oz (89.6 kg)      Other studies Reviewed: Additional studies/ records that were reviewed today include: ***. Review of the above records demonstrates:  Please see elsewhere in the note.     ASSESSMENT AND PLAN:  Palpitations:  ***  We will continue her as needed beta-blocker and I told her that  she could take more of this as needed.  Eventually as she gets her thyroid corrected she may not need supplemental short acting beta-blocker.   Hyperthyroidism:   ***  This is now being followed by endocrinology.  I will defer to their management.  Covid education: She has not wanted to get the vaccine but we had a long conversation about this.   Current medicines are reviewed at length with the patient today.  The patient does not have concerns regarding medicines.  The following changes have been made:  ***  Labs/ tests ordered today include: *** No orders of the defined types were placed in this encounter.    Disposition:   FU with me in ***  Signed, Rollene Rotunda, MD  03/27/2020 4:04 PM    [facility]

## 2020-03-28 ENCOUNTER — Ambulatory Visit: Payer: Medicare Other | Admitting: Cardiology

## 2020-03-28 DIAGNOSIS — E0590 Thyrotoxicosis, unspecified without thyrotoxic crisis or storm: Secondary | ICD-10-CM

## 2020-03-28 DIAGNOSIS — R002 Palpitations: Secondary | ICD-10-CM

## 2020-04-10 NOTE — Progress Notes (Signed)
Cardiology Office Note   Date:  04/11/2020   ID:  Sylvia Hall, DOB [birth year], MRN [id number]  PCP:  Hoy Register, MD  Cardiologist:   Rollene Rotunda, MD   No chief complaint on file.     History of Present Illness: Sylvia Hall is a 60 y.o. female who presents for ongoing assessment and management of mitral valve prolapse and palpitations.  She had a monitor.  This was completed on 06/21/2019 which revealed occasional SVT brief runs.  She was treated with short acting metoprolol as needed for symptoms.  Of note she did have TSH completed which revealed a TSH low at 0.012 indicative of hyperthyroidism.  Free T3, and T4 was normal.    She presents for follow up.  Since I last saw her she has had palpitations requiring her to take 12-1/2 of immediate release metoprolol a couple of times a day.  These happen sporadically.  They are not sustained.  They are skipped and irregular beats.  She cannot bring them on.  She has not had any presyncope or syncope..  She denies any chest pain or new shortness of breath.  She has had no PND or orthopnea.  Past Medical History:  Diagnosis Date  . Anxiety   . Arthritis   . Asthma   . Back pain, chronic   . Bilateral carpal tunnel syndrome   . Depression   . DVT (deep vein thrombosis) in pregnancy   . Environmental allergies   . Epilepsy (HCC)   . Fatigue   . GERD (gastroesophageal reflux disease)   . Headache    sinus  . History of kidney stones   . Mitral valve prolapse   . Pericarditis   . PTSD (post-traumatic stress disorder)   . Recurrent upper respiratory infection (URI)   . Stenosis of lumbosacral spine    numbness L hand and L leg  . Thrombophlebitis    Age 69  . Urticaria     Past Surgical History:  Procedure Laterality Date  . ABDOMINAL HYSTERECTOMY     partial  . APPENDECTOMY    . BIOPSY BREAST     Right breast x 10  . BREAST LUMPECTOMY     Right breast  . CARPAL TUNNEL RELEASE Left 11/10/2017   Procedure:  LEFT CARPAL TUNNEL RELEASE;  Surgeon: Cammy Copa, MD;  Location: [facility] OR;  Service: Orthopedics;  Laterality: Left;  . CESAREAN SECTION     x 2  . NOSE SURGERY     s/p trauma  . OVARY SURGERY    . RHINOPLASTY    . TUBAL LIGATION     left tubal  . UTERINE FIBROID SURGERY       Current Outpatient Medications  Medication Sig Dispense Refill  . albuterol (PROVENTIL) (2.5 MG/3ML) 0.083% nebulizer solution Take 3 mLs (2.5 mg total) by nebulization every 6 (six) hours as needed for wheezing or shortness of breath. 150 mL 1  . Azelastine-Fluticasone 137-50 MCG/ACT SUSP Place 1 spray into the nose in the morning and at bedtime. 23 g 5  . cyclobenzaprine (FLEXERIL) 10 MG tablet Take 1 tablet (10 mg total) by mouth 3 (three) times daily as needed for muscle spasms. 90 tablet 6  . Fluticasone-Umeclidin-Vilant (TRELEGY ELLIPTA) 100-62.5-25 MCG/INH AEPB Inhale 1 puff into the lungs daily. Rinse mouth after each use. 60 each 2  . gabapentin (NEURONTIN) 300 MG capsule Take 1 capsule (300 mg total) by mouth at bedtime. 30 capsule 6  .  levalbuterol (XOPENEX HFA) 45 MCG/ACT inhaler Inhale 2 puffs into the lungs every 6 (six) hours as needed for wheezing or shortness of breath (coughing fit). 1 Inhaler 2  . lidocaine (XYLOCAINE) 5 % ointment Apply 1 application topically as needed. 35.44 g 0  . metoprolol tartrate (LOPRESSOR) 25 MG tablet Take 0.5 tablets (12.5 mg total) by mouth as needed. For palpitations 30 tablet 3  . Olopatadine HCl 0.2 % SOLN Apply 1 drop to eye daily as needed (itchy/watery eyes). 2.5 mL 5  . pseudoephedrine-acetaminophen (TYLENOL SINUS) 30-500 MG TABS tablet Take 1 tablet by mouth every 4 (four) hours as needed.    Marland Kitchen rOPINIRole (REQUIP XL) 2 MG 24 hr tablet Take 1 tablet (2 mg total) by mouth at bedtime. 30 tablet 6  . metoprolol succinate (TOPROL-XL) 100 MG 24 hr tablet Take 1 tablet (100 mg total) by mouth daily. TAKE 1 TABLET BY MOUTH EVERY DAY IMMEDIATELY FOLLOWING A MEAL 90  tablet 3   Current Facility-Administered Medications  Medication Dose Route Frequency Provider Last Rate Last Admin  . methylPREDNISolone acetate (DEPO-MEDROL) injection 80 mg  80 mg Other Once Tyrell Antonio, MD        Allergies:   Ivp dye [iodinated diagnostic agents], Levofloxacin, Sulfa drugs cross reactors, Dilantin [phenytoin sodium extended], Methimazole, Aspirin, Caffeine, Cymbalta [duloxetine hcl], Penicillins, Pregabalin, and Sodium nitrate    ROS:  Please see the history of present illness.   Otherwise, review of systems are positive for none.   All other systems are reviewed and negative.    PHYSICAL EXAM: VS:  BP 126/80   Pulse 88   Ht 5\' 3"  (1.6 m)   Wt 202 lb 12.8 oz (92 kg)   SpO2 95%   BMI 35.92 kg/m  , BMI Body mass index is 35.92 kg/m. GENERAL:  Well appearing NECK:  No jugular venous distention, waveform within normal limits, carotid upstroke brisk and symmetric, no bruits, no thyromegaly LUNGS:  Clear to auscultation bilaterally CHEST:  Unremarkable HEART:  PMI not displaced or sustained,S1 and S2 within normal limits, no S3, no S4, no clicks, no rubs,no murmurs ABD:  Flat, positive bowel sounds normal in frequency in pitch, no bruits, no rebound, no guarding, no midline pulsatile mass, no hepatomegaly, no splenomegaly EXT:  2 plus pulses throughout, no edema, no cyanosis no clubbing   EKG:  EKG  ordered today. Sinus rhythm, rate 88, axis within normal limits, intervals within normal limits, no acute ST-T wave changes.   Recent Labs: 05/25/2019: BUN 15; Creatinine, Ser 0.73; Magnesium 1.8; Potassium 4.3; Sodium 140 08/06/2019: Hemoglobin 14.7; Platelets 168 10/12/2019: TSH 0.74    Lipid Panel    Component Value Date/Time   CHOL 214 (H) 05/19/2017 1021   TRIG 98 05/19/2017 1021   HDL 62 05/19/2017 1021   CHOLHDL 3.5 05/19/2017 1021   CHOLHDL 3.4 12/01/2013 1615   VLDL 23 12/01/2013 1615   LDLCALC 132 (H) 05/19/2017 1021      Wt Readings from  Last 3 Encounters:  04/11/20 202 lb 12.8 oz (92 kg)  10/12/19 204 lb (92.5 kg)  09/28/19 202 lb (91.6 kg)      Other studies Reviewed: Additional studies/ records that were reviewed today include: None. Review of the above records demonstrates:    ASSESSMENT AND PLAN:  Palpitations:    I am going to increase her metoprolol to 100 mg daily.  We could try twice daily if this works better.  She can continue with the as needed  as needed.    Current medicines are reviewed at length with the patient today.  The patient does not have concerns regarding medicines.  The following changes have been made: As above  Labs/ tests ordered today include:   Orders Placed This Encounter  Procedures  . EKG 12-Lead     Disposition:   FU with me in 12 months  Signed, Rollene Rotunda, MD  04/11/2020 2:22 PM    [facility]

## 2020-04-11 ENCOUNTER — Encounter: Payer: Self-pay | Admitting: Cardiology

## 2020-04-11 ENCOUNTER — Other Ambulatory Visit: Payer: Self-pay

## 2020-04-11 ENCOUNTER — Ambulatory Visit (INDEPENDENT_AMBULATORY_CARE_PROVIDER_SITE_OTHER): Payer: Medicare Other | Admitting: Cardiology

## 2020-04-11 VITALS — BP 126/80 | HR 88 | Ht 63.0 in | Wt 202.8 lb

## 2020-04-11 DIAGNOSIS — E0590 Thyrotoxicosis, unspecified without thyrotoxic crisis or storm: Secondary | ICD-10-CM

## 2020-04-11 DIAGNOSIS — R002 Palpitations: Secondary | ICD-10-CM

## 2020-04-11 MED ORDER — METOPROLOL SUCCINATE ER 100 MG PO TB24: 100.0000 mg | ORAL_TABLET | Freq: Every day | ORAL | 3 refills | Status: DC

## 2020-04-11 NOTE — Patient Instructions (Signed)
Medication Instructions:  Increase Metoprolol to 100mg  daily *If you need a refill on your cardiac medications before your next appointment, please call your pharmacy*  Follow-Up: At [facility], you and your health needs are our priority.  As part of our continuing mission to provide you with exceptional heart care, we have created designated Provider Care Teams.  These Care Teams include your primary Cardiologist (physician) and Advanced Practice Providers (APPs -  Physician Assistants and Nurse Practitioners) who all work together to provide you with the care you need, when you need it.  We recommend signing up for the patient portal called "MyChart".  Sign up information is provided on this After Visit Summary.  MyChart is used to connect with patients for Virtual Visits (Telemedicine).  Patients are able to view lab/test results, encounter notes, upcoming appointments, etc.  Non-urgent messages can be sent to your provider as well.   To learn more about what you can do with MyChart, go to [facility].    Your next appointment:   12 month(s) You will receive a reminder letter in the mail two months in advance. If you don't receive a letter, please call our office to schedule the follow-up appointment.  The format for your next appointment:   In Person  Provider:   [email or web address], MD

## 2020-04-13 ENCOUNTER — Other Ambulatory Visit: Payer: Self-pay | Admitting: Family Medicine

## 2020-04-13 DIAGNOSIS — G894 Chronic pain syndrome: Secondary | ICD-10-CM

## 2020-04-13 DIAGNOSIS — G2581 Restless legs syndrome: Secondary | ICD-10-CM

## 2020-04-13 NOTE — Telephone Encounter (Signed)
Requested Prescriptions  Pending Prescriptions Disp Refills  . gabapentin (NEURONTIN) 300 MG capsule [Pharmacy Med Name: GABAPENTIN 300 MG CAPSULE] 90 capsule 1    Sig: TAKE 1 CAPSULE BY MOUTH EVERYDAY AT BEDTIME     Neurology: Anticonvulsants - gabapentin Passed - 04/13/2020 11:32 AM      Passed - Valid encounter within last 12 months    Recent Outpatient Visits          7 months ago Hyperthyroidism   [facility] [place], Odette Horns, MD   8 months ago Colon cancer screening   [facility] [place], Odette Horns, MD   10 months ago Abnormal thyroid blood test   [facility] Hoy Register, MD   1 year ago Chronic pain syndrome   [facility] [place], Odette Horns, MD   1 year ago Other chronic sinusitis   [facility] [place], [place], MD             . rOPINIRole (REQUIP XL) 2 MG 24 hr tablet [Pharmacy Med Name: ROPINIROLE HCL ER 2 MG TABLET] 90 tablet 1    Sig: TAKE 1 TABLET (2 MG TOTAL) BY MOUTH AT BEDTIME.     Neurology:  Parkinsonian Agents Passed - 04/13/2020 11:32 AM      Passed - Last BP in normal range    BP Readings from Last 1 Encounters:  04/11/20 126/80         Passed - Valid encounter within last 12 months    Recent Outpatient Visits          7 months ago Hyperthyroidism   [facility] Hoy Register, MD   8 months ago Colon cancer screening   [facility] Hoy Register, MD   10 months ago Abnormal thyroid blood test   [facility] Hoy Register, MD   1 year ago Chronic pain syndrome   [facility] Hoy Register, MD   1 year ago Other chronic sinusitis   [facility] Hoy Register, MD

## 2020-05-14 ENCOUNTER — Other Ambulatory Visit: Payer: Self-pay | Admitting: Family Medicine

## 2020-05-14 DIAGNOSIS — G894 Chronic pain syndrome: Secondary | ICD-10-CM

## 2020-05-14 NOTE — Telephone Encounter (Signed)
Requested medications are due for refill today yes  Requested medications are on the active medication list yes  Last refill 3/6  Last visit 08/2019, was to return in Jan 2022 but did not.  Future visit scheduled no  Notes to clinic Not Delegated.

## 2020-05-19 ENCOUNTER — Other Ambulatory Visit: Payer: Self-pay | Admitting: Family Medicine

## 2020-05-19 DIAGNOSIS — G894 Chronic pain syndrome: Secondary | ICD-10-CM

## 2020-05-19 NOTE — Telephone Encounter (Signed)
Requested medication (s) are due for refill today: yes  Requested medication (s) are on the active medication list: yes  Last refill:  09/04/19 #90 6 refills  Future visit scheduled: no  Notes to clinic:  not delegate per protocol, called patient to schedule appt. No answer, left voicemail to call and schedule appt. Do you want a courtesy refill ?     Requested Prescriptions  Pending Prescriptions Disp Refills   cyclobenzaprine (FLEXERIL) 10 MG tablet [Pharmacy Med Name: CYCLOBENZAPRINE 10 MG TABLET] 90 tablet 5    Sig: TAKE 1 TABLET BY MOUTH THREE TIMES A DAY AS NEEDED FOR MUSCLE SPASMS      Not Delegated - Analgesics:  Muscle Relaxants Failed - 05/19/2020  5:36 PM      Failed - This refill cannot be delegated      Failed - Valid encounter within last 6 months    Recent Outpatient Visits           8 months ago Hyperthyroidism   [facility] Hoy Register, MD   9 months ago Colon cancer screening   [facility] [place], Odette Horns, MD   11 months ago Abnormal thyroid blood test   [facility] Hoy Register, MD   1 year ago Chronic pain syndrome   [facility] Hoy Register, MD   1 year ago Other chronic sinusitis   [facility] Hoy Register, MD

## 2020-05-20 ENCOUNTER — Encounter: Payer: Self-pay | Admitting: Family Medicine

## 2020-05-20 DIAGNOSIS — G894 Chronic pain syndrome: Secondary | ICD-10-CM

## 2020-07-16 ENCOUNTER — Ambulatory Visit: Payer: Medicare Other | Attending: Family Medicine | Admitting: Family Medicine

## 2020-07-16 ENCOUNTER — Other Ambulatory Visit: Payer: Self-pay

## 2020-07-16 ENCOUNTER — Encounter: Payer: Self-pay | Admitting: Family Medicine

## 2020-07-16 VITALS — BP 108/72 | HR 90 | Ht 63.0 in | Wt 200.2 lb

## 2020-07-16 DIAGNOSIS — Z886 Allergy status to analgesic agent status: Secondary | ICD-10-CM | POA: Insufficient documentation

## 2020-07-16 DIAGNOSIS — E0590 Thyrotoxicosis, unspecified without thyrotoxic crisis or storm: Secondary | ICD-10-CM | POA: Diagnosis present

## 2020-07-16 DIAGNOSIS — Z79899 Other long term (current) drug therapy: Secondary | ICD-10-CM | POA: Diagnosis not present

## 2020-07-16 DIAGNOSIS — R002 Palpitations: Secondary | ICD-10-CM | POA: Diagnosis not present

## 2020-07-16 DIAGNOSIS — Z8249 Family history of ischemic heart disease and other diseases of the circulatory system: Secondary | ICD-10-CM | POA: Diagnosis not present

## 2020-07-16 DIAGNOSIS — J42 Unspecified chronic bronchitis: Secondary | ICD-10-CM | POA: Diagnosis not present

## 2020-07-16 DIAGNOSIS — Z882 Allergy status to sulfonamides status: Secondary | ICD-10-CM | POA: Insufficient documentation

## 2020-07-16 DIAGNOSIS — Z136 Encounter for screening for cardiovascular disorders: Secondary | ICD-10-CM

## 2020-07-16 DIAGNOSIS — Z881 Allergy status to other antibiotic agents status: Secondary | ICD-10-CM | POA: Diagnosis not present

## 2020-07-16 DIAGNOSIS — Z760 Encounter for issue of repeat prescription: Secondary | ICD-10-CM | POA: Insufficient documentation

## 2020-07-16 DIAGNOSIS — Z13220 Encounter for screening for lipoid disorders: Secondary | ICD-10-CM | POA: Diagnosis not present

## 2020-07-16 DIAGNOSIS — G2581 Restless legs syndrome: Secondary | ICD-10-CM | POA: Diagnosis not present

## 2020-07-16 DIAGNOSIS — M5116 Intervertebral disc disorders with radiculopathy, lumbar region: Secondary | ICD-10-CM | POA: Diagnosis not present

## 2020-07-16 DIAGNOSIS — Z7951 Long term (current) use of inhaled steroids: Secondary | ICD-10-CM | POA: Insufficient documentation

## 2020-07-16 DIAGNOSIS — G894 Chronic pain syndrome: Secondary | ICD-10-CM | POA: Diagnosis not present

## 2020-07-16 DIAGNOSIS — M542 Cervicalgia: Secondary | ICD-10-CM | POA: Insufficient documentation

## 2020-07-16 DIAGNOSIS — G5601 Carpal tunnel syndrome, right upper limb: Secondary | ICD-10-CM

## 2020-07-16 DIAGNOSIS — Z86718 Personal history of other venous thrombosis and embolism: Secondary | ICD-10-CM | POA: Diagnosis not present

## 2020-07-16 DIAGNOSIS — Z888 Allergy status to other drugs, medicaments and biological substances status: Secondary | ICD-10-CM | POA: Diagnosis not present

## 2020-07-16 DIAGNOSIS — Z880 Allergy status to penicillin: Secondary | ICD-10-CM | POA: Insufficient documentation

## 2020-07-16 MED ORDER — ALBUTEROL SULFATE (2.5 MG/3ML) 0.083% IN NEBU: 2.5000 mg | INHALATION_SOLUTION | Freq: Four times a day (QID) | RESPIRATORY_TRACT | 1 refills | Status: DC | PRN

## 2020-07-16 MED ORDER — ROPINIROLE HCL ER 2 MG PO TB24: 2.0000 mg | ORAL_TABLET | Freq: Every day | ORAL | 1 refills | Status: DC

## 2020-07-16 MED ORDER — GABAPENTIN 300 MG PO CAPS: ORAL_CAPSULE | ORAL | 1 refills | Status: DC

## 2020-07-16 NOTE — Progress Notes (Signed)
Subjective:  Patient ID: Sylvia Hall, female    DOB: Jan 27, 1961  Age: 60 y.o. MRN: [id number]  CC: Hyperthyroidism   HPI Sylvia Hall  is a 60 year old female with a history of  Asthma, chronic neck and lower back pain with associated radiculopathy (MRI of the c-spine and L spine from an outside hospital from 12/2006   revealed mild degenerative changes, annular bulges at L2-3,3-4,4-5 and neural foramina narrowing.C-spine reveals C3-4, 4-5 foraminal narrowing) seen for chronic disease management.  Interval History: Saw Cardiologist, Dr Percival Spanish for management of palpitations 3 months ago and Metoprolol dose was increased. Last visit with Endocrine was in the fall of last year for Subclinical Hyperthyroidism and no medication regimen initiated with follow up in 1 year recommended. Smokes 1 pach  cig q 2 days 2 weeks ago she had a bug bite on her R cheeck. Used triple abx ointment and warm compress as she had drainage from the lesion which has now subsided. Scar is still present. R hand fingers are numb and she drops things due to her Carpal tunnel syndrome. Undecided about Carpal tunnel release surgery.  She requests refills of Requip for Restless legs and also requests refill of Gabapentin for her chronic neck pain. Past Medical History:  Diagnosis Date   Anxiety    Arthritis    Asthma    Back pain, chronic    Bilateral carpal tunnel syndrome    Depression    DVT (deep vein thrombosis) in pregnancy    Environmental allergies    Epilepsy (HCC)    Fatigue    GERD (gastroesophageal reflux disease)    Headache    sinus   History of kidney stones    Mitral valve prolapse    Pericarditis    PTSD (post-traumatic stress disorder)    Recurrent upper respiratory infection (URI)    Stenosis of lumbosacral spine    numbness L hand and L leg   Thrombophlebitis    Age 21   Urticaria     Past Surgical History:  Procedure Laterality Date   ABDOMINAL HYSTERECTOMY     partial    APPENDECTOMY     BIOPSY BREAST     Right breast x 10   BREAST LUMPECTOMY     Right breast   CARPAL TUNNEL RELEASE Left 11/10/2017   Procedure: LEFT CARPAL TUNNEL RELEASE;  Surgeon: Meredith Pel, MD;  Location: [facility];  Service: Orthopedics;  Laterality: Left;   CESAREAN SECTION     x 2   NOSE SURGERY     s/p trauma   OVARY SURGERY     RHINOPLASTY     TUBAL LIGATION     left tubal   UTERINE FIBROID SURGERY      Family History  Problem Relation Age of Onset   Diabetes Mother    Mitral valve prolapse Mother    Asthma Mother    CAD Mother 76   Anxiety disorder Mother    Mitral valve prolapse Sister    Asthma Sister    Allergic rhinitis Sister    Mitral valve prolapse Son    Alcohol abuse Son    Diabetes Maternal Uncle    Diabetes Maternal Grandmother    Mitral valve prolapse Sister    CAD Son 23       No details available   Alcohol abuse Father    Alcohol abuse Brother     Allergies  Allergen Reactions   Ivp Dye [Iodinated Diagnostic  Agents] Hives   Levofloxacin Hives   Sulfa Drugs Cross Reactors Hives   Dilantin [Phenytoin Sodium Extended] Hives and Itching   Methimazole Other (See Comments)    dizziness   Aspirin Other (See Comments)    Upset stomach, resolves with GERD meds    Caffeine Other (See Comments)    Heart races   Cymbalta [Duloxetine Hcl] Diarrhea and Nausea Only    Dizzy   Penicillins Rash    Pt called in stating she  Was prescribed Pen VK in ED and developed a generalized raised rash.  She has taken amoxicillin and augmentin since this time and not had any problem   Pregabalin Rash   Sodium Nitrate Other (See Comments)    headaches    Outpatient Medications Prior to Visit  Medication Sig Dispense Refill   albuterol (PROVENTIL) (2.5 MG/3ML) 0.083% nebulizer solution Take 3 mLs (2.5 mg total) by nebulization every 6 (six) hours as needed for wheezing or shortness of breath. 150 mL 1   Azelastine-Fluticasone 137-50 MCG/ACT SUSP Place 1  spray into the nose in the morning and at bedtime. 23 g 5   cyclobenzaprine (FLEXERIL) 10 MG tablet TAKE 1 TABLET BY MOUTH THREE TIMES A DAY AS NEEDED FOR MUSCLE SPASMS 90 tablet 5   Fluticasone-Umeclidin-Vilant (TRELEGY ELLIPTA) 100-62.5-25 MCG/INH AEPB Inhale 1 puff into the lungs daily. Rinse mouth after each use. 60 each 2   gabapentin (NEURONTIN) 300 MG capsule TAKE 1 CAPSULE BY MOUTH EVERYDAY AT BEDTIME 90 capsule 1   levalbuterol (XOPENEX HFA) 45 MCG/ACT inhaler Inhale 2 puffs into the lungs every 6 (six) hours as needed for wheezing or shortness of breath (coughing fit). 1 Inhaler 2   metoprolol succinate (TOPROL-XL) 100 MG 24 hr tablet Take 1 tablet (100 mg total) by mouth daily. TAKE 1 TABLET BY MOUTH EVERY DAY IMMEDIATELY FOLLOWING A MEAL 90 tablet 3   Olopatadine HCl 0.2 % SOLN Apply 1 drop to eye daily as needed (itchy/watery eyes). 2.5 mL 5   rOPINIRole (REQUIP XL) 2 MG 24 hr tablet TAKE 1 TABLET (2 MG TOTAL) BY MOUTH AT BEDTIME. 90 tablet 1   lidocaine (XYLOCAINE) 5 % ointment Apply 1 application topically as needed. (Patient not taking: Reported on 07/16/2020) 35.44 g 0   metoprolol tartrate (LOPRESSOR) 25 MG tablet Take 0.5 tablets (12.5 mg total) by mouth as needed. For palpitations 30 tablet 3   pseudoephedrine-acetaminophen (TYLENOL SINUS) 30-500 MG TABS tablet Take 1 tablet by mouth every 4 (four) hours as needed. (Patient not taking: Reported on 07/16/2020)     Facility-Administered Medications Prior to Visit  Medication Dose Route Frequency Provider Last Rate Last Admin   methylPREDNISolone acetate (DEPO-MEDROL) injection 80 mg  80 mg Other Once Magnus Sinning, MD         ROS Review of Systems  Constitutional:  Negative for activity change, appetite change and fatigue.  HENT:  Negative for congestion, sinus pressure and sore throat.   Eyes:  Negative for visual disturbance.  Respiratory:  Negative for cough, chest tightness, shortness of breath and wheezing.    Cardiovascular:  Negative for chest pain and palpitations.  Gastrointestinal:  Negative for abdominal distention, abdominal pain and constipation.  Endocrine: Negative for polydipsia.  Genitourinary:  Negative for dysuria and frequency.  Musculoskeletal:  Positive for neck pain. Negative for arthralgias and back pain.  Skin:  Positive for rash.  Neurological:  Positive for numbness. Negative for tremors and light-headedness.  Hematological:  Does not bruise/bleed easily.  Psychiatric/Behavioral:  Negative for agitation and behavioral problems.    Objective:  BP 108/72   Pulse 90   Ht _0  (1.6 m)   Wt 200 lb 3.2 oz (90.8 kg)   SpO2 96%   BMI 35.46 kg/m   BP/Weight 07/16/2020 06/10/2021 04/12/3566  Systolic BP 616 837 290  Diastolic BP 72 80 80  Wt. (Lbs) 200.2 202.8 204  BMI 35.46 35.92 36.14  Some encounter information is confidential and restricted. Go to Review Flowsheets activity to see all data.      Physical Exam Constitutional:      Appearance: She is well-developed. She is obese.  Neck:     Vascular: No JVD.  Cardiovascular:     Rate and Rhythm: Normal rate.     Heart sounds: Normal heart sounds. No murmur heard. Pulmonary:     Effort: Pulmonary effort is normal.     Breath sounds: Normal breath sounds. No wheezing or rales.  Chest:     Chest wall: No tenderness.  Abdominal:     General: Bowel sounds are normal. There is no distension.     Palpations: Abdomen is soft. There is no mass.     Tenderness: There is no abdominal tenderness.  Musculoskeletal:        General: Normal range of motion.     Right lower leg: No edema.     Left lower leg: No edema.  Skin:    Comments: Erythematous scar on superior aspect of right maxillary region with no discharge noted, slight surrounding edema.  Neurological:     Mental Status: She is alert and oriented to person, place, and time.  Psychiatric:        Mood and Affect: Mood normal.    CMP Latest Ref Rng & Units  05/25/2019 06/25/2018 11/10/2017  Glucose 65 - 99 mg/dL 83 105(H) 88  BUN 6 - 24 mg/dL _1 Creatinine 0.57 - 1.00 mg/dL 0.73 1.12(H) 0.75  Sodium 134 - 144 mmol/L 140 137 140  Potassium 3.5 - 5.2 mmol/L 4.3 3.3(L) 3.8  Chloride 96 - 106 mmol/L 104 100 108  CO2 20 - 29 mmol/L 23 23 21(L)  Calcium 8.7 - 10.2 mg/dL 9.6 9.5 9.3  Total Protein 6.0 - 8.5 g/dL - - -  Total Bilirubin 0.0 - 1.2 mg/dL - - -  Alkaline Phos 39 - 117 IU/L - - -  AST 0 - 40 IU/L - - -  ALT 0 - 32 IU/L - - -    Lipid Panel     Component Value Date/Time   CHOL 214 (H) 05/19/2017 1021   TRIG 98 05/19/2017 1021   HDL 62 05/19/2017 1021   CHOLHDL 3.5 05/19/2017 1021   CHOLHDL 3.4 12/01/2013 1615   VLDL 23 12/01/2013 1615   LDLCALC 132 (H) 05/19/2017 1021    CBC    Component Value Date/Time   WBC 5.8 08/06/2019 1609   WBC 8.7 06/25/2018 2305   RBC 4.66 08/06/2019 1609   RBC 4.46 06/25/2018 2305   HGB 14.7 08/06/2019 1609   HCT 43.9 08/06/2019 1609   PLT 168 08/06/2019 1609   MCV 94 08/06/2019 1609   MCH 31.5 08/06/2019 1609   MCH 31.6 06/25/2018 2305   MCHC 33.5 08/06/2019 1609   MCHC 33.6 06/25/2018 2305   RDW 12.9 08/06/2019 1609   LYMPHSABS 1.6 08/06/2019 1609   MONOABS 0.7 06/25/2018 2305   EOSABS 0.1 08/06/2019 1609   BASOSABS 0.0 08/06/2019 1609  Lab Results  Component Value Date   HGBA1C 5.1 12/01/2013    Assessment & Plan:  1. Restless leg syndrome Stable - rOPINIRole (REQUIP XL) 2 MG 24 hr tablet; Take 1 tablet (2 mg total) by mouth at bedtime.  Dispense: 90 tablet; Refill: 1  2. Chronic bronchitis, unspecified chronic bronchitis type (HCC) Controlled - albuterol (PROVENTIL) (2.5 MG/3ML) 0.083% nebulizer solution; Take 3 mLs (2.5 mg total) by nebulization every 6 (six) hours as needed for wheezing or shortness of breath.  Dispense: 150 mL; Refill: 1  3. Chronic pain syndrome Ongoing pain which is stable on current regimen - gabapentin (NEURONTIN) 300 MG capsule; TAKE 1  CAPSULE BY MOUTH EVERYDAY AT BEDTIME  Dispense: 90 capsule; Refill: 1  4. Subclinical hyperthyroidism Currently not on treatment Observation per Endocrine  5. Carpal tunnel syndrome of right wrist Uncontrolled Use wrist splints She will need to speak with her Orthopedic regarding   6. Palpitations Controlled on current dose of Metoprolol - CMP14+EGFR; Future  7. Encounter for lipid screening for cardiovascular disease - Lipid panel; Future    No orders of the defined types were placed in this encounter.   Return in about 1 month (around 08/15/2020) for Pap smear and Medicare wellness visit.       Charlott Rakes, MD, FAAFP. [facility] [place], [place]   07/16/2020, 4:06 PM

## 2020-07-23 ENCOUNTER — Ambulatory Visit: Payer: Medicare Other | Attending: Family Medicine

## 2020-07-23 ENCOUNTER — Other Ambulatory Visit: Payer: Self-pay

## 2020-07-23 DIAGNOSIS — R002 Palpitations: Secondary | ICD-10-CM

## 2020-07-23 DIAGNOSIS — Z13220 Encounter for screening for lipoid disorders: Secondary | ICD-10-CM

## 2020-07-24 LAB — LIPID PANEL
Chol/HDL Ratio: 3.3 ratio (ref 0.0–4.4)
Cholesterol, Total: 179 mg/dL (ref 100–199)
HDL: 54 mg/dL (ref >39)
LDL Chol Calc (NIH): 101 mg/dL — ABNORMAL HIGH (ref 0–99)
Triglycerides: 134 mg/dL (ref 0–149)
VLDL Cholesterol Cal: 24 mg/dL (ref 5–40)

## 2020-07-24 LAB — CMP14+EGFR
ALT: 19 IU/L (ref 0–32)
AST: 22 IU/L (ref 0–40)
Albumin/Globulin Ratio: 2.3 — ABNORMAL HIGH (ref 1.2–2.2)
Albumin: 4.5 g/dL (ref 3.8–4.9)
Alkaline Phosphatase: 95 IU/L (ref 44–121)
BUN/Creatinine Ratio: 18 (ref 12–28)
BUN: 15 mg/dL (ref 8–27)
Bilirubin Total: 0.5 mg/dL (ref 0.0–1.2)
CO2: 22 mmol/L (ref 20–29)
Calcium: 9.5 mg/dL (ref 8.7–10.3)
Chloride: 103 mmol/L (ref 96–106)
Creatinine, Ser: 0.84 mg/dL (ref 0.57–1.00)
Globulin, Total: 2 g/dL (ref 1.5–4.5)
Glucose: 101 mg/dL — ABNORMAL HIGH (ref 65–99)
Potassium: 3.9 mmol/L (ref 3.5–5.2)
Sodium: 138 mmol/L (ref 134–144)
Total Protein: 6.5 g/dL (ref 6.0–8.5)
eGFR: 80 mL/min/{1.73_m2} (ref >59)

## 2020-07-25 ENCOUNTER — Encounter: Payer: Self-pay | Admitting: Family Medicine

## 2020-09-03 ENCOUNTER — Ambulatory Visit: Payer: Medicare Other | Admitting: Family Medicine

## 2020-09-15 ENCOUNTER — Telehealth: Payer: Self-pay | Admitting: Family Medicine

## 2020-09-15 NOTE — Telephone Encounter (Signed)
   PT is having dentalCopied from CRM [id number]. Topic: General - Other >> Sep 10, 2020 12:29 PM Jaquita Rector A wrote: Reason for CRM: Patient called in to inquire of Dr Alvis Lemmings about a form that she left at the front desk several weeks ago to be completed from her dentist. Patient has an appointment this Friday 09/11/20 and need the form to take back with her. Please call patient today at Ph# [phone]

## 2020-09-15 NOTE — Telephone Encounter (Signed)
Pt is calling for an abx due to having dental  surgery on 8/11. Pt states she left a form over a week ago and has not heard anything back  [id number] [place], [place] - [address]

## 2020-09-16 NOTE — Telephone Encounter (Signed)
I completed the form a while back and it should have been faxed to her dentist. Per guidelines, no antibiotic indicated and this was stated on the form. If the dentist thinks otherwise he can communicate with her cardiologist.

## 2020-09-16 NOTE — Telephone Encounter (Signed)
Pt states that she has a dental appointment on 09/18/2020 and she states that they will not do any dental work until she get an antibiotic from her PCP.

## 2020-09-17 ENCOUNTER — Telehealth: Payer: Self-pay | Admitting: Cardiology

## 2020-09-17 NOTE — Telephone Encounter (Signed)
New Message:    Patient said she was told to check and see if she needs an Antibiotic before she have maybe 2 teeth extracted on tomorrow? If she does need one, please call it in for her asap.

## 2020-09-17 NOTE — Telephone Encounter (Signed)
Called patient back, pt reports she is having dental work tomorrow (8/11) and was told by her dentist to check with cardiology to see if she needs antibiotics to have "definitely one, but possibly two" teeth pulled. She has a history of mitral valve prolapse but has not had a valve replacement. She reports she was medically cleared by her PCP. Nurse advised pt her concerns would be sent to Dr. Antoine Poche for review. Pt verbalized understanding, all questions/concerns addressed at this time.

## 2020-09-18 NOTE — Telephone Encounter (Signed)
Patient was returning call 

## 2020-09-18 NOTE — Telephone Encounter (Signed)
Called patient; left message to call back.

## 2020-09-18 NOTE — Telephone Encounter (Signed)
Called patient spoke to DOD Dr.Harding no antibiotic needed for dental work.

## 2020-10-06 ENCOUNTER — Other Ambulatory Visit (HOSPITAL_COMMUNITY)
Admission: RE | Admit: 2020-10-06 | Discharge: 2020-10-06 | Disposition: A | Discharge status: Home / Self Care | Payer: Medicare Other | Source: Ambulatory Visit | Attending: Family Medicine | Admitting: Family Medicine

## 2020-10-06 ENCOUNTER — Encounter: Payer: Self-pay | Admitting: Family Medicine

## 2020-10-06 ENCOUNTER — Ambulatory Visit (HOSPITAL_BASED_OUTPATIENT_CLINIC_OR_DEPARTMENT_OTHER): Payer: Medicare Other | Admitting: Family Medicine

## 2020-10-06 ENCOUNTER — Other Ambulatory Visit: Payer: Self-pay

## 2020-10-06 VITALS — BP 105/70 | HR 99 | Ht 63.0 in | Wt 201.0 lb

## 2020-10-06 DIAGNOSIS — Z881 Allergy status to other antibiotic agents status: Secondary | ICD-10-CM | POA: Insufficient documentation

## 2020-10-06 DIAGNOSIS — R87610 Atypical squamous cells of undetermined significance on cytologic smear of cervix (ASC-US): Secondary | ICD-10-CM | POA: Insufficient documentation

## 2020-10-06 DIAGNOSIS — Z886 Allergy status to analgesic agent status: Secondary | ICD-10-CM | POA: Insufficient documentation

## 2020-10-06 DIAGNOSIS — Z1151 Encounter for screening for human papillomavirus (HPV): Secondary | ICD-10-CM | POA: Insufficient documentation

## 2020-10-06 DIAGNOSIS — Z01411 Encounter for gynecological examination (general) (routine) with abnormal findings: Secondary | ICD-10-CM | POA: Diagnosis not present

## 2020-10-06 DIAGNOSIS — L240 Irritant contact dermatitis due to detergents: Secondary | ICD-10-CM | POA: Insufficient documentation

## 2020-10-06 DIAGNOSIS — Z882 Allergy status to sulfonamides status: Secondary | ICD-10-CM | POA: Insufficient documentation

## 2020-10-06 DIAGNOSIS — Z880 Allergy status to penicillin: Secondary | ICD-10-CM | POA: Insufficient documentation

## 2020-10-06 MED ORDER — HYDROCORTISONE 0.5 % EX CREA: 1.0000 "application " | TOPICAL_CREAM | Freq: Two times a day (BID) | CUTANEOUS | 0 refills | Status: AC

## 2020-10-06 NOTE — Patient Instructions (Signed)
Contact Dermatitis Dermatitis is redness, soreness, and swelling (inflammation) of the skin. Contact dermatitis is a reaction to certain substances that touch the skin. Many different substances can cause contact dermatitis. There are two types of contact dermatitis: Irritant contact dermatitis. This type is caused by something that irritates your skin, such as having dry hands from washing them too often with soap. This type does not require previous exposure to the substance for a reaction to occur. This is the most common type. Allergic contact dermatitis. This type is caused by a substance that you are allergic to, such as poison ivy. This type occurs when you have been exposed to the substance (allergen) and develop a sensitivity to it. Dermatitis may develop soon after your first exposure to the allergen, or it may not develop until the next time you are exposed and every time thereafter. What are the causes? Irritant contact dermatitis is most commonly caused by exposure to: Makeup. Soaps. Detergents. Bleaches. Acids. Metal salts, such as nickel. Allergic contact dermatitis is most commonly caused by exposure to: Poisonous plants. Chemicals. Jewelry. Latex. Medicines. Preservatives in products, such as clothing. What increases the risk? You are more likely to develop this condition if you have: A job that exposes you to irritants or allergens. Certain medical conditions, such as asthma or eczema. What are the signs or symptoms? Symptoms of this condition may occur on your body anywhere the irritant has touched you or is touched by you. Symptoms include: Dryness or flaking. Redness. Cracks. Itching. Pain or a burning feeling. Blisters. Drainage of small amounts of blood or clear fluid from skin cracks. With allergic contact dermatitis, there may also be swelling in areas such asthe eyelids, mouth, or genitals. How is this diagnosed? This condition is diagnosed with a medical  history and physical exam. A patch skin test may be performed to help determine the cause. If the condition is related to your job, you may need to see an occupational medicine specialist. How is this treated? This condition is treated by checking for the cause of the reaction and protecting your skin from further contact. Treatment may also include: Steroid creams or ointments. Oral steroid medicines may be needed in more severe cases. Antibiotic medicines or antibacterial ointments, if a skin infection is present. Antihistamine lotion or an antihistamine taken by mouth to ease itching. A bandage (dressing). Follow these instructions at home: Skin care Moisturize your skin as needed. Apply cool compresses to the affected areas. Try applying baking soda paste to your skin. Stir water into baking soda until it reaches a paste-like consistency. Do not scratch your skin, and avoid friction to the affected area. Avoid the use of soaps, perfumes, and dyes. Medicines Take or apply over-the-counter and prescription medicines only as told by your health care provider. If you were prescribed an antibiotic medicine, take or apply the antibiotic as told by your health care provider. Do not stop using the antibiotic even if your condition improves. Bathing Try taking a bath with: Epsom salts. Follow the instructions on the packaging. You can get these at your local pharmacy or grocery store. Baking soda. Pour a small amount into the bath as directed by your health care provider. Colloidal oatmeal. Follow the instructions on the packaging. You can get this at your local pharmacy or grocery store. Bathe less frequently, such as every other day. Bathe in lukewarm water. Avoid using hot water. Bandage care If you were given a bandage (dressing), change it as told by   your health care provider. Wash your hands with soap and water before and after you change your dressing. If soap and water are not  available, use hand sanitizer. General instructions Avoid the substance that caused your reaction. If you do not know what caused it, keep a journal to try to track what caused it. Write down: What you eat. What cosmetic products you use. What you drink. What you wear in the affected area. This includes jewelry. Check the affected areas every day for signs of infection. Check for: More redness, swelling, or pain. More fluid or blood. Warmth. Pus or a bad smell. Keep all follow-up visits as told by your health care provider. This is important. Contact a health care provider if: Your condition does not improve with treatment. Your condition gets worse. You have signs of infection such as swelling, tenderness, redness, soreness, or warmth in the affected area. You have a fever. You have new symptoms. Get help right away if: You have a severe headache, neck pain, or neck stiffness. You vomit. You feel very sleepy. You notice red streaks coming from the affected area. Your bone or joint underneath the affected area becomes painful after the skin has healed. The affected area turns darker. You have difficulty breathing. Summary Dermatitis is redness, soreness, and swelling (inflammation) of the skin. Contact dermatitis is a reaction to certain substances that touch the skin. Symptoms of this condition may occur on your body anywhere the irritant has touched you or is touched by you. This condition is treated by figuring out what caused the reaction and protecting your skin from further contact. Treatment may also include medicines and skin care. Avoid the substance that caused your reaction. If you do not know what caused it, keep a journal to try to track what caused it. Contact a health care provider if your condition gets worse or you have signs of infection such as swelling, tenderness, redness, soreness, or warmth in the affected area. This information is not intended to replace  advice given to you by your health care provider. Make sure you discuss any questions you have with your healthcare provider. Document Revised: 05/17/2018 Document Reviewed: 08/10/2017 Elsevier Patient Education  2022 [employer]  

## 2020-10-06 NOTE — Progress Notes (Signed)
Subjective:  Patient ID: Sylvia Hall, female    DOB: 10/25/1960  Age: 60 y.o. MRN: [id number]  CC: Gynecologic Exam   HPI Sylvia Hall is a 60 y.o. year old female with a history of  is a 60 year old female with a history of  Asthma, chronic neck and lower back pain with associated radiculopathy (MRI of the c-spine and L spine from an outside hospital from 12/2006   revealed mild degenerative changes, annular bulges at L2-3,3-4,4-5 and neural foramina narrowing.C-spine reveals C3-4, 4-5 foraminal narrowing).    Interval History: She presents today for repeat Pap smear as her last Pap smear from 08/08/2019 revealed ASCUS, HPV negative. 4 days ago she developed a rash after changing her detergent and now itches in her underarms and crural folds. Past Medical History:  Diagnosis Date   Anxiety    Arthritis    Asthma    Back pain, chronic    Bilateral carpal tunnel syndrome    Depression    DVT (deep vein thrombosis) in pregnancy    Environmental allergies    Epilepsy (HCC)    Fatigue    GERD (gastroesophageal reflux disease)    Headache    sinus   History of kidney stones    Mitral valve prolapse    Pericarditis    PTSD (post-traumatic stress disorder)    Recurrent upper respiratory infection (URI)    Stenosis of lumbosacral spine    numbness L hand and L leg   Thrombophlebitis    Age 60   Urticaria     Past Surgical History:  Procedure Laterality Date   ABDOMINAL HYSTERECTOMY     partial   APPENDECTOMY     BIOPSY BREAST     Right breast x 10   BREAST LUMPECTOMY     Right breast   CARPAL TUNNEL RELEASE Left 11/10/2017   Procedure: LEFT CARPAL TUNNEL RELEASE;  Surgeon: Cammy Copa, MD;  Location: [facility] OR;  Service: Orthopedics;  Laterality: Left;   CESAREAN SECTION     x 2   NOSE SURGERY     s/p trauma   OVARY SURGERY     RHINOPLASTY     TUBAL LIGATION     left tubal   UTERINE FIBROID SURGERY      Family History  Problem Relation Age of Onset    Diabetes Mother    Mitral valve prolapse Mother    Asthma Mother    CAD Mother 66   Anxiety disorder Mother    Mitral valve prolapse Sister    Asthma Sister    Allergic rhinitis Sister    Mitral valve prolapse Son    Alcohol abuse Son    Diabetes Maternal Uncle    Diabetes Maternal Grandmother    Mitral valve prolapse Sister    CAD Son 23       No details available   Alcohol abuse Father    Alcohol abuse Brother     Allergies  Allergen Reactions   Ivp Dye [Iodinated Diagnostic Agents] Hives   Levofloxacin Hives   Sulfa Drugs Cross Reactors Hives   Dilantin [Phenytoin Sodium Extended] Hives and Itching   Methimazole Other (See Comments)    dizziness   Aspirin Other (See Comments)    Upset stomach, resolves with GERD meds    Caffeine Other (See Comments)    Heart races   Cymbalta [Duloxetine Hcl] Diarrhea and Nausea Only    Dizzy   Penicillins Rash  Pt called in stating she  Was prescribed Pen VK in ED and developed a generalized raised rash.  She has taken amoxicillin and augmentin since this time and not had any problem   Pregabalin Rash   Sodium Nitrate Other (See Comments)    headaches    Outpatient Medications Prior to Visit  Medication Sig Dispense Refill   albuterol (PROVENTIL) (2.5 MG/3ML) 0.083% nebulizer solution Take 3 mLs (2.5 mg total) by nebulization every 6 (six) hours as needed for wheezing or shortness of breath. 150 mL 1   Azelastine-Fluticasone 137-50 MCG/ACT SUSP Place 1 spray into the nose in the morning and at bedtime. 23 g 5   cyclobenzaprine (FLEXERIL) 10 MG tablet TAKE 1 TABLET BY MOUTH THREE TIMES A DAY AS NEEDED FOR MUSCLE SPASMS 90 tablet 5   Fluticasone-Umeclidin-Vilant (TRELEGY ELLIPTA) 100-62.5-25 MCG/INH AEPB Inhale 1 puff into the lungs daily. Rinse mouth after each use. 60 each 2   gabapentin (NEURONTIN) 300 MG capsule TAKE 1 CAPSULE BY MOUTH EVERYDAY AT BEDTIME 90 capsule 1   levalbuterol (XOPENEX HFA) 45 MCG/ACT inhaler Inhale 2  puffs into the lungs every 6 (six) hours as needed for wheezing or shortness of breath (coughing fit). 1 Inhaler 2   lidocaine (XYLOCAINE) 5 % ointment Apply 1 application topically as needed. 35.44 g 0   metoprolol succinate (TOPROL-XL) 100 MG 24 hr tablet Take 1 tablet (100 mg total) by mouth daily. TAKE 1 TABLET BY MOUTH EVERY DAY IMMEDIATELY FOLLOWING A MEAL 90 tablet 3   Olopatadine HCl 0.2 % SOLN Apply 1 drop to eye daily as needed (itchy/watery eyes). 2.5 mL 5   pseudoephedrine-acetaminophen (TYLENOL SINUS) 30-500 MG TABS tablet Take 1 tablet by mouth every 4 (four) hours as needed.     rOPINIRole (REQUIP XL) 2 MG 24 hr tablet Take 1 tablet (2 mg total) by mouth at bedtime. 90 tablet 1   metoprolol tartrate (LOPRESSOR) 25 MG tablet Take 0.5 tablets (12.5 mg total) by mouth as needed. For palpitations 30 tablet 3   Facility-Administered Medications Prior to Visit  Medication Dose Route Frequency Provider Last Rate Last Admin   methylPREDNISolone acetate (DEPO-MEDROL) injection 80 mg  80 mg Other Once Tyrell Antonio, MD         ROS Review of Systems  Constitutional:  Negative for activity change, appetite change and fatigue.  HENT:  Negative for congestion, sinus pressure and sore throat.   Eyes:  Negative for visual disturbance.  Respiratory:  Negative for cough, chest tightness, shortness of breath and wheezing.   Cardiovascular:  Negative for chest pain and palpitations.  Gastrointestinal:  Negative for abdominal distention, abdominal pain and constipation.  Endocrine: Negative for polydipsia.  Genitourinary:  Negative for dysuria and frequency.  Musculoskeletal:  Negative for arthralgias and back pain.  Skin:  Positive for rash.  Neurological:  Negative for tremors, light-headedness and numbness.  Hematological:  Does not bruise/bleed easily.  Psychiatric/Behavioral:  Negative for agitation and behavioral problems.    Objective:  BP 105/70   Pulse 99   Ht 5\' 3"  (1.6 m)    Wt 201 lb (91.2 kg)   SpO2 95%   BMI 35.61 kg/m   BP/Weight 10/06/2020 07/16/2020 04/11/2020  Systolic BP 105 108 126  Diastolic BP 70 72 80  Wt. (Lbs) 201 200.2 202.8  BMI 35.61 35.46 35.92  Some encounter information is confidential and restricted. Go to Review Flowsheets activity to see all data.      Physical Exam Constitutional:  Appearance: She is well-developed.  Cardiovascular:     Rate and Rhythm: Normal rate.     Heart sounds: Normal heart sounds. No murmur heard. Pulmonary:     Effort: Pulmonary effort is normal.     Breath sounds: Normal breath sounds. No wheezing or rales.  Chest:     Chest wall: No tenderness.  Abdominal:     General: Bowel sounds are normal. There is no distension.     Palpations: Abdomen is soft. There is no mass.     Tenderness: There is no abdominal tenderness.  Genitourinary:    General: Normal vulva.     Labia:        Right: No rash.        Left: No rash.      Vagina: Normal.     Cervix: Normal.     Uterus: Normal.      Adnexa: Right adnexa normal and left adnexa normal.  Musculoskeletal:        General: Normal range of motion.     Right lower leg: No edema.     Left lower leg: No edema.  Skin:    Comments: No rash in crural fold but slight hyperemia noted  Neurological:     Mental Status: She is alert and oriented to person, place, and time.  Psychiatric:        Mood and Affect: Mood normal.    CMP Latest Ref Rng & Units 07/23/2020 05/25/2019 06/25/2018  Glucose 65 - 99 mg/dL 161(W101(H) 83 960(A105(H)  BUN 8 - 27 mg/dL 15 15 16   Creatinine 0.57 - 1.00 mg/dL 5.400.84 9.810.73 1.91(Y1.12(H)  Sodium 134 - 144 mmol/L 138 140 137  Potassium 3.5 - 5.2 mmol/L 3.9 4.3 3.3(L)  Chloride 96 - 106 mmol/L 103 104 100  CO2 20 - 29 mmol/L 22 23 23   Calcium 8.7 - 10.3 mg/dL 9.5 9.6 9.5  Total Protein 6.0 - 8.5 g/dL 6.5 - -  Total Bilirubin 0.0 - 1.2 mg/dL 0.5 - -  Alkaline Phos 44 - 121 IU/L 95 - -  AST 0 - 40 IU/L 22 - -  ALT 0 - 32 IU/L 19 - -    Lipid  Panel     Component Value Date/Time   CHOL 179 07/23/2020 0903   TRIG 134 07/23/2020 0903   HDL 54 07/23/2020 0903   CHOLHDL 3.3 07/23/2020 0903   CHOLHDL 3.4 12/01/2013 1615   VLDL 23 12/01/2013 1615   LDLCALC 101 (H) 07/23/2020 0903    CBC    Component Value Date/Time   WBC 5.8 08/06/2019 1609   WBC 8.7 06/25/2018 2305   RBC 4.66 08/06/2019 1609   RBC 4.46 06/25/2018 2305   HGB 14.7 08/06/2019 1609   HCT 43.9 08/06/2019 1609   PLT 168 08/06/2019 1609   MCV 94 08/06/2019 1609   MCH 31.5 08/06/2019 1609   MCH 31.6 06/25/2018 2305   MCHC 33.5 08/06/2019 1609   MCHC 33.6 06/25/2018 2305   RDW 12.9 08/06/2019 1609   LYMPHSABS 1.6 08/06/2019 1609   MONOABS 0.7 06/25/2018 2305   EOSABS 0.1 08/06/2019 1609   BASOSABS 0.0 08/06/2019 1609    Lab Results  Component Value Date   HGBA1C 5.1 12/01/2013    Assessment & Plan:  1. ASCUS of cervix with negative high risk HPV - Cytology - PAP([place])  2. Irritant contact dermatitis due to detergent Secondary to detergent which she has since discontinued Advised to avoid genitalia when applying cream only  to crural folds and underarms. - hydrocortisone cream 0.5 %; Apply 1 application topically 2 (two) times daily.  Dispense: 30 g; Refill: 0   Health Care Maintenance: Declines flu shot Meds ordered this encounter  Medications   hydrocortisone cream 0.5 %    Sig: Apply 1 application topically 2 (two) times daily.    Dispense:  30 g    Refill:  0    Follow-up: Return in about 3 months (around 01/06/2021) for Medical conditions.       Hoy Register, MD, FAAFP. [facility] [place], [place] [phone]   10/06/2020, 2:03 PM

## 2020-10-08 LAB — CYTOLOGY - PAP
Comment: NEGATIVE
Diagnosis: NEGATIVE
High risk HPV: NEGATIVE

## 2020-10-12 ENCOUNTER — Other Ambulatory Visit: Payer: Self-pay | Admitting: Family Medicine

## 2020-10-12 DIAGNOSIS — G2581 Restless legs syndrome: Secondary | ICD-10-CM

## 2020-10-14 ENCOUNTER — Encounter: Payer: Self-pay | Admitting: Family Medicine

## 2020-10-14 ENCOUNTER — Other Ambulatory Visit: Payer: Self-pay | Admitting: Allergy

## 2020-12-24 ENCOUNTER — Ambulatory Visit: Payer: Medicare Other | Attending: Physician Assistant | Admitting: Physician Assistant

## 2020-12-24 ENCOUNTER — Encounter: Payer: Self-pay | Admitting: Physician Assistant

## 2020-12-24 ENCOUNTER — Other Ambulatory Visit: Payer: Self-pay

## 2020-12-24 VITALS — BP 131/89 | HR 84 | Resp 16 | Wt 202.0 lb

## 2020-12-24 DIAGNOSIS — H6981 Other specified disorders of Eustachian tube, right ear: Secondary | ICD-10-CM | POA: Diagnosis not present

## 2020-12-24 DIAGNOSIS — J069 Acute upper respiratory infection, unspecified: Secondary | ICD-10-CM | POA: Diagnosis not present

## 2020-12-24 DIAGNOSIS — J9801 Acute bronchospasm: Secondary | ICD-10-CM

## 2020-12-24 MED ORDER — PREDNISONE 10 MG PO TABS
ORAL_TABLET | ORAL | 0 refills | Status: DC
Start: 2020-12-24 — End: 2021-01-13

## 2020-12-24 MED ORDER — AZITHROMYCIN 250 MG PO TABS: ORAL_TABLET | ORAL | 0 refills | Status: AC

## 2020-12-24 NOTE — Patient Instructions (Signed)
Eustachian Tube Dysfunction °Eustachian tube dysfunction refers to a condition in which a blockage develops in the narrow passage that connects the middle ear to the back of the nose (eustachian tube). The eustachian tube regulates air pressure in the middle ear by letting air move between the ear and nose. It also helps to drain fluid from the middle ear space. °Eustachian tube dysfunction can affect one or both ears. When the eustachian tube does not function properly, air pressure, fluid, or both can build up in the middle ear. °What are the causes? °This condition occurs when the eustachian tube becomes blocked or cannot open normally. Common causes of this condition include: °Ear infections. °Colds and other infections that affect the nose, mouth, and throat (upper respiratory tract). °Allergies. °Irritation from cigarette smoke. °Irritation from stomach acid coming up into the esophagus (gastroesophageal reflux). The esophagus is the part of the body that moves food from the mouth to the stomach. °Sudden changes in air pressure, such as from descending in an airplane or scuba diving. °Abnormal growths in the nose or throat, such as: °Growths that line the nose (nasal polyps). °Abnormal growth of cells (tumors). °Enlarged tissue at the back of the throat (adenoids). °What increases the risk? °You are more likely to develop this condition if: °You smoke. °You are overweight. °You are a child who has: °Certain birth defects of the mouth, such as cleft palate. °Large tonsils or adenoids. °What are the signs or symptoms? °Common symptoms of this condition include: °A feeling of fullness in the ear. °Ear pain. °Clicking or popping noises in the ear. °Ringing in the ear (tinnitus). °Hearing loss. °Loss of balance. °Dizziness. °Symptoms may get worse when the air pressure around you changes, such as when you travel to an area of high elevation, fly on an airplane, or go scuba diving. °How is this diagnosed? °This  condition may be diagnosed based on: °Your symptoms. °A physical exam of your ears, nose, and throat. °Tests, such as those that measure: °The movement of your eardrum. °Your hearing (audiometry). °How is this treated? °Treatment depends on the cause and severity of your condition. °In mild cases, you may relieve your symptoms by moving air into your ears. This is called "popping the ears." °In more severe cases, or if you have symptoms of fluid in your ears, treatment may include: °Medicines to relieve congestion (decongestants). °Medicines that treat allergies (antihistamines). °Nasal sprays or ear drops that contain medicines that reduce swelling (steroids). °A procedure to drain the fluid in your eardrum. In this procedure, a small tube may be placed in the eardrum to: °Drain the fluid. °Restore the air in the middle ear space. °A procedure to insert a balloon device through the nose to inflate the opening of the eustachian tube (balloon dilation). °Follow these instructions at home: °Lifestyle °Do not do any of the following until your health care provider approves: °Travel to high altitudes. °Fly in airplanes. °Work in a pressurized cabin or room. °Scuba dive. °Do not use any products that contain nicotine or tobacco. These products include cigarettes, chewing tobacco, and vaping devices, such as e-cigarettes. If you need help quitting, ask your health care provider. °Keep your ears dry. Wear fitted earplugs during showering and bathing. Dry your ears completely after. °General instructions °Take over-the-counter and prescription medicines only as told by your health care provider. °Use techniques to help pop your ears as recommended by your health care provider. These may include: °Chewing gum. °Yawning. °Frequent, forceful swallowing. °Closing   your mouth, holding your nose closed, and gently blowing as if you are trying to blow air out of your nose. °Keep all follow-up visits. This is important. °Contact a  health care provider if: °Your symptoms do not go away after treatment. °Your symptoms come back after treatment. °You are unable to pop your ears. °You have: °A fever. °Pain in your ear. °Pain in your head or neck. °Fluid draining from your ear. °Your hearing suddenly changes. °You become very dizzy. °You lose your balance. °Get help right away if: °You have a sudden, severe increase in any of your symptoms. °Summary °Eustachian tube dysfunction refers to a condition in which a blockage develops in the eustachian tube. °It can be caused by ear infections, allergies, inhaled irritants, or abnormal growths in the nose or throat. °Symptoms may include ear pain or fullness, hearing loss, or ringing in the ears. °Mild cases are treated with techniques to unblock the ears, such as yawning or chewing gum. °More severe cases are treated with medicines or procedures. °This information is not intended to replace advice given to you by your health care provider. Make sure you discuss any questions you have with your health care provider. °Document Revised: 04/07/2020 Document Reviewed: 04/07/2020 °Elsevier Patient Education © 2022 [employer] ° °

## 2020-12-24 NOTE — Progress Notes (Signed)
Sylvia Hall, is a 60 y.o. female  UXN:[id number]  URK:[id number]  DOB - 1960-08-16  Chief Complaint  Patient presents with   Recurrent Otitis       Subjective:   Sylvia Hall is a 60 y.o. female here today for 1 week h/o R ear pain and cough and congestion.  Some yellowish mucus.  No fever.  No change in appetite. No loss of taste or smell.     No problems updated.  ALLERGIES: Allergies  Allergen Reactions   Ivp Dye [Iodinated Diagnostic Agents] Hives   Levofloxacin Hives   Sulfa Drugs Cross Reactors Hives   Dilantin [Phenytoin Sodium Extended] Hives and Itching   Methimazole Other (See Comments)    dizziness   Aspirin Other (See Comments)    Upset stomach, resolves with GERD meds    Caffeine Other (See Comments)    Heart races   Cymbalta [Duloxetine Hcl] Diarrhea and Nausea Only    Dizzy   Penicillins Rash    Pt called in stating she  Was prescribed Pen VK in ED and developed a generalized raised rash.  She has taken amoxicillin and augmentin since this time and not had any problem   Pregabalin Rash   Sodium Nitrate Other (See Comments)    headaches    PAST MEDICAL HISTORY: Past Medical History:  Diagnosis Date   Anxiety    Arthritis    Asthma    Back pain, chronic    Bilateral carpal tunnel syndrome    Depression    DVT (deep vein thrombosis) in pregnancy    Environmental allergies    Epilepsy (HCC)    Fatigue    GERD (gastroesophageal reflux disease)    Headache    sinus   History of kidney stones    Mitral valve prolapse    Pericarditis    PTSD (post-traumatic stress disorder)    Recurrent upper respiratory infection (URI)    Stenosis of lumbosacral spine    numbness L hand and L leg   Thrombophlebitis    Age 52   Urticaria     MEDICATIONS AT HOME: Prior to Admission medications   Medication Sig Start Date End Date Taking? Authorizing Provider  albuterol (PROVENTIL) (2.5 MG/3ML) 0.083% nebulizer solution Take 3 mLs (2.5 mg total) by  nebulization every 6 (six) hours as needed for wheezing or shortness of breath. 07/16/20  Yes Hoy Register, MD  Azelastine-Fluticasone 137-50 MCG/ACT SUSP Place 1 spray into the nose in the morning and at bedtime. 08/06/19  Yes Ellamae Sia, DO  azithromycin (ZITHROMAX) 250 MG tablet Take 2 tablets on day 1, then 1 tablet daily on days 2 through 5 12/24/20 12/29/20 Yes Champion Corales, Marzella Schlein, PA-C  cyclobenzaprine (FLEXERIL) 10 MG tablet TAKE 1 TABLET BY MOUTH THREE TIMES A DAY AS NEEDED FOR MUSCLE SPASMS 05/19/20  Yes Storm Frisk, MD  Fluticasone-Umeclidin-Vilant (TRELEGY ELLIPTA) 100-62.5-25 MCG/INH AEPB Inhale 1 puff into the lungs daily. Rinse mouth after each use. 08/06/19  Yes Ellamae Sia, DO  gabapentin (NEURONTIN) 300 MG capsule TAKE 1 CAPSULE BY MOUTH EVERYDAY AT BEDTIME 07/16/20  Yes Newlin, Enobong, MD  hydrocortisone cream 0.5 % Apply 1 application topically 2 (two) times daily. 10/06/20  Yes Hoy Register, MD  levalbuterol (XOPENEX HFA) 45 MCG/ACT inhaler Inhale 2 puffs into the lungs every 6 (six) hours as needed for wheezing or shortness of breath (coughing fit). 08/06/19  Yes Ellamae Sia, DO  lidocaine (XYLOCAINE) 5 % ointment Apply 1 application  topically as needed. 06/15/18  Yes Marcello Fennel, MD  metoprolol succinate (TOPROL-XL) 100 MG 24 hr tablet Take 1 tablet (100 mg total) by mouth daily. TAKE 1 TABLET BY MOUTH EVERY DAY IMMEDIATELY FOLLOWING A MEAL 04/11/20  Yes Rollene Rotunda, MD  Olopatadine HCl 0.2 % SOLN Apply 1 drop to eye daily as needed (itchy/watery eyes). 08/06/19  Yes Ellamae Sia, DO  predniSONE (DELTASONE) 10 MG tablet 6,5,4,3,2,1 take each days dose all at once in am with food 12/24/20  Yes Zackry Deines M, PA-C  pseudoephedrine-acetaminophen (TYLENOL SINUS) 30-500 MG TABS tablet Take 1 tablet by mouth every 4 (four) hours as needed.   Yes [provider]  rOPINIRole (REQUIP XL) 2 MG 24 hr tablet TAKE 1 TABLET BY MOUTH AT BEDTIME. 10/14/20  Yes Newlin, Enobong,  MD  metoprolol tartrate (LOPRESSOR) 25 MG tablet Take 0.5 tablets (12.5 mg total) by mouth as needed. For palpitations 05/22/19 08/20/19  Rollene Rotunda, MD  cetirizine (ZYRTEC) 10 MG tablet TAKE 1 TABLET BY MOUTH EVERY DAY 04/06/19 07/18/19  Newlin, Odette Horns, MD    ROS: Neg HEENT Neg resp Neg cardiac Neg GI Neg GU Neg MS Neg psych Neg neuro  Objective:   Vitals:   12/24/20 1519  BP: 131/89  Pulse: 84  Resp: 16  SpO2: 99%  Weight: 202 lb (91.6 kg)   Exam General appearance : Awake, alert, not in any distress. Speech Clear. Not toxic looking HEENT: Atraumatic and Normocephalic.  L canal and TM WNL.  R TM bulging, dull and with pale erythema.  Neck: Supple, no JVD. No cervical lymphadenopathy.  Chest: Good air entry bilaterally, CTAB.  No rales/rhonchi.  There is mild wheezing throughout.   CVS: S1 S2 regular, no murmurs.  Extremities: B/L Lower Ext shows no edema, both legs are warm to touch Neurology: Awake alert, and oriented X 3, CN II-XII intact, Non focal Skin: No Rash  Data Review Lab Results  Component Value Date   HGBA1C 5.1 12/01/2013   HGBA1C 5.2 04/12/2012    Assessment & Plan   1. Upper respiratory tract infection, unspecified type Will cover for atypicals - azithromycin (ZITHROMAX) 250 MG tablet; Take 2 tablets on day 1, then 1 tablet daily on days 2 through 5  Dispense: 6 tablet; Refill: 0 - predniSONE (DELTASONE) 10 MG tablet; 6,5,4,3,2,1 take each days dose all at once in am with food  Dispense: 21 tablet; Refill: 0 Last blood sugar=101 07/2020  2. Dysfunction of right eustachian tube - predniSONE (DELTASONE) 10 MG tablet; 6,5,4,3,2,1 take each days dose all at once in am with food  Dispense: 21 tablet; Refill: 0  3. Bronchospasm - predniSONE (DELTASONE) 10 MG tablet; 6,5,4,3,2,1 take each days dose all at once in am with food  Dispense: 21 tablet; Refill: 0 -use inhalers as directed.    Smoking cessation advised   Patient have been counseled  extensively about nutrition and exercise. Other issues discussed during this visit include: low cholesterol diet, weight control and daily exercise, foot care, annual eye examinations at Ophthalmology, importance of adherence with medications and regular follow-up. We also discussed long term complications of uncontrolled diabetes and hypertension.   Return in about 2 months (around 02/23/2021) for with PCP for chronic conditions.  The patient was given clear instructions to go to ER or return to medical center if symptoms don't improve, worsen or new problems develop. The patient verbalized understanding. The patient was told to call to get lab results if they haven't  heard anything in the next week.      Georgian Co, PA-C [facility] [place], [place] [phone]   12/24/2020, 3:26 PM Patient ID: DANELY BAYLISS, female   DOB: Dec 07, 1960, 61 y.o.   MRN: [id number]

## 2021-01-13 ENCOUNTER — Other Ambulatory Visit: Payer: Self-pay

## 2021-01-13 ENCOUNTER — Encounter (HOSPITAL_COMMUNITY): Payer: Self-pay | Admitting: Emergency Medicine

## 2021-01-13 ENCOUNTER — Ambulatory Visit (INDEPENDENT_AMBULATORY_CARE_PROVIDER_SITE_OTHER): Payer: Medicare Other

## 2021-01-13 ENCOUNTER — Ambulatory Visit (HOSPITAL_COMMUNITY)
Admission: EM | Admit: 2021-01-13 | Discharge: 2021-01-13 | Disposition: A | Discharge status: Home / Self Care | Payer: Medicare Other | Attending: Emergency Medicine | Admitting: Emergency Medicine

## 2021-01-13 DIAGNOSIS — Z20822 Contact with and (suspected) exposure to covid-19: Secondary | ICD-10-CM | POA: Insufficient documentation

## 2021-01-13 DIAGNOSIS — J189 Pneumonia, unspecified organism: Secondary | ICD-10-CM | POA: Insufficient documentation

## 2021-01-13 DIAGNOSIS — F17210 Nicotine dependence, cigarettes, uncomplicated: Secondary | ICD-10-CM | POA: Diagnosis not present

## 2021-01-13 DIAGNOSIS — R079 Chest pain, unspecified: Secondary | ICD-10-CM | POA: Diagnosis not present

## 2021-01-13 DIAGNOSIS — H9201 Otalgia, right ear: Secondary | ICD-10-CM | POA: Diagnosis present

## 2021-01-13 LAB — POC INFLUENZA A AND B ANTIGEN (URGENT CARE ONLY)
INFLUENZA A ANTIGEN, POC: NEGATIVE
INFLUENZA B ANTIGEN, POC: NEGATIVE

## 2021-01-13 MED ORDER — FLUTICASONE PROPIONATE 50 MCG/ACT NA SUSP: 1.0000 | Freq: Two times a day (BID) | NASAL | 0 refills | Status: AC

## 2021-01-13 MED ORDER — PREDNISONE 50 MG PO TABS: 50.0000 mg | ORAL_TABLET | Freq: Every day | ORAL | 0 refills | Status: AC

## 2021-01-13 MED ORDER — CEFPODOXIME PROXETIL 200 MG PO TABS: 200.0000 mg | ORAL_TABLET | Freq: Two times a day (BID) | ORAL | 0 refills | Status: AC

## 2021-01-13 NOTE — ED Triage Notes (Signed)
Pt presents with cough, chest pain, wheezing, sob, headaches, and right ear pain. States was seen by PCP 2-3 weeks ago and treated with z-pac but symptoms have not resolved.

## 2021-01-13 NOTE — ED Provider Notes (Signed)
[facility] Emergency Department Provider Note  ____________________________________________  Time seen: Approximately 4:29 PM  I have reviewed the triage vital signs and the nursing notes.   HISTORY  Chief Complaint Shortness of Breath, Wheezing, Otalgia (Right), and Headache    HPI Sylvia Hall is a 60 y.o. female who presents to the urgent care complaining of chest tightness, nasal congestion, right ear pain, cough.  Patient started with viral URI symptoms roughly 3 weeks ago, was placed on a Z-Pak by her primary care provider.  She states that she slightly felt better for 1 day but is pretty much maintained symptoms for the last 3 weeks.  She denied any substernal chest pain, states that she does have a cardiac history but this pain is not cardiac in her opinion, she states that her chest chest feels "tight" with coughing.  No shortness of breath.  No GI symptoms.  Patient has not been tested for flu or COVID.       Past Medical History:  Diagnosis Date   Anxiety    Arthritis    Asthma    Back pain, chronic    Bilateral carpal tunnel syndrome    Depression    DVT (deep vein thrombosis) in pregnancy    Environmental allergies    Epilepsy (HCC)    Fatigue    GERD (gastroesophageal reflux disease)    Headache    sinus   History of kidney stones    Mitral valve prolapse    Pericarditis    PTSD (post-traumatic stress disorder)    Recurrent upper respiratory infection (URI)    Stenosis of lumbosacral spine    numbness L hand and L leg   Thrombophlebitis    Age 73   Urticaria     Patient Active Problem List   Diagnosis Date Noted   Seasonal and perennial allergic rhinoconjunctivitis 10/08/2019   History of frequent upper respiratory infection 08/06/2019   Allergic conjunctivitis of both eyes 08/06/2019   Subclinical hyperthyroidism 07/10/2019   Educated about COVID-19 virus infection 03/31/2019   Tobacco use 08/10/2018   Obesity (BMI  35.0-39.9 without comorbidity) 08/01/2018   Myalgia 07/13/2018   Chronic pain syndrome 10/07/2017   Degenerative disc disease, lumbar 08/17/2017   Hyperlipidemia 08/17/2017   Left sided numbness 12/01/2013   Acute sinusitis, unspecified 12/08/2012   Muscle spasm of right leg 12/08/2012   Acute upper respiratory infections of unspecified site 12/08/2012   Severe episode of recurrent major depressive disorder (HCC) 12/07/2012   Borderline personality disorder (HCC) 10/19/2012   Acute posttraumatic stress disorder 08/24/2012   Palpitations 08/16/2012   Neuropathy 08/16/2012   Restless leg syndrome 08/16/2012   Routine gynecological examination 07/31/2012   Neurogenic pain 04/12/2012   Anxiety 11/03/2011   Vitamin D deficiency 08/08/2011   Chronic back pain 05/29/2011   MVP (mitral valve prolapse) 05/29/2011   Asthma-COPD overlap syndrome (HCC) 05/29/2011   Tobacco user 05/29/2011   Chronic mastitis 05/29/2011   Other allergic rhinitis 05/29/2011    Past Surgical History:  Procedure Laterality Date   ABDOMINAL HYSTERECTOMY     partial   APPENDECTOMY     BIOPSY BREAST     Right breast x 10   BREAST LUMPECTOMY     Right breast   CARPAL TUNNEL RELEASE Left 11/10/2017   Procedure: LEFT CARPAL TUNNEL RELEASE;  Surgeon: Cammy Copa, MD;  Location: [facility] OR;  Service: Orthopedics;  Laterality: Left;   CESAREAN SECTION     x 2  NOSE SURGERY     s/p trauma   OVARY SURGERY     RHINOPLASTY     TUBAL LIGATION     left tubal   UTERINE FIBROID SURGERY      Prior to Admission medications   Medication Sig Start Date End Date Taking? Authorizing Provider  cefpodoxime (VANTIN) 200 MG tablet Take 1 tablet (200 mg total) by mouth 2 (two) times daily for 5 days. 01/13/21 01/18/21 Yes Tristyn Demarest, Delorise Royals, PA-C  fluticasone (FLONASE) 50 MCG/ACT nasal spray Place 1 spray into both nostrils 2 (two) times daily. 01/13/21  Yes Mykala Mccready, Delorise Royals, PA-C  predniSONE (DELTASONE) 50 MG  tablet Take 1 tablet (50 mg total) by mouth daily with breakfast. 01/13/21  Yes Montavius Subramaniam, Delorise Royals, PA-C  albuterol (PROVENTIL) (2.5 MG/3ML) 0.083% nebulizer solution Take 3 mLs (2.5 mg total) by nebulization every 6 (six) hours as needed for wheezing or shortness of breath. 07/16/20   Hoy Register, MD  Azelastine-Fluticasone 137-50 MCG/ACT SUSP Place 1 spray into the nose in the morning and at bedtime. 08/06/19   Ellamae Sia, DO  cyclobenzaprine (FLEXERIL) 10 MG tablet TAKE 1 TABLET BY MOUTH THREE TIMES A DAY AS NEEDED FOR MUSCLE SPASMS 05/19/20   Storm Frisk, MD  Fluticasone-Umeclidin-Vilant (TRELEGY ELLIPTA) 100-62.5-25 MCG/INH AEPB Inhale 1 puff into the lungs daily. Rinse mouth after each use. 08/06/19   Ellamae Sia, DO  gabapentin (NEURONTIN) 300 MG capsule TAKE 1 CAPSULE BY MOUTH EVERYDAY AT BEDTIME 07/16/20   Hoy Register, MD  hydrocortisone cream 0.5 % Apply 1 application topically 2 (two) times daily. 10/06/20   Hoy Register, MD  levalbuterol (XOPENEX HFA) 45 MCG/ACT inhaler Inhale 2 puffs into the lungs every 6 (six) hours as needed for wheezing or shortness of breath (coughing fit). 08/06/19   Ellamae Sia, DO  lidocaine (XYLOCAINE) 5 % ointment Apply 1 application topically as needed. 06/15/18   Marcello Fennel, MD  metoprolol succinate (TOPROL-XL) 100 MG 24 hr tablet Take 1 tablet (100 mg total) by mouth daily. TAKE 1 TABLET BY MOUTH EVERY DAY IMMEDIATELY FOLLOWING A MEAL 04/11/20   Rollene Rotunda, MD  metoprolol tartrate (LOPRESSOR) 25 MG tablet Take 0.5 tablets (12.5 mg total) by mouth as needed. For palpitations 05/22/19 08/20/19  Rollene Rotunda, MD  Olopatadine HCl 0.2 % SOLN Apply 1 drop to eye daily as needed (itchy/watery eyes). 08/06/19   Ellamae Sia, DO  pseudoephedrine-acetaminophen (TYLENOL SINUS) 30-500 MG TABS tablet Take 1 tablet by mouth every 4 (four) hours as needed.    [provider]  rOPINIRole (REQUIP XL) 2 MG 24 hr tablet TAKE 1 TABLET BY MOUTH AT  BEDTIME. 10/14/20   Hoy Register, MD  cetirizine (ZYRTEC) 10 MG tablet TAKE 1 TABLET BY MOUTH EVERY DAY 04/06/19 07/18/19  Hoy Register, MD    Allergies Ivp dye [iodinated diagnostic agents], Levofloxacin, Sulfa drugs cross reactors, Dilantin [phenytoin sodium extended], Methimazole, Aspirin, Caffeine, Cymbalta [duloxetine hcl], Penicillins, Pregabalin, and Sodium nitrate  Family History  Problem Relation Age of Onset   Diabetes Mother    Mitral valve prolapse Mother    Asthma Mother    CAD Mother 32   Anxiety disorder Mother    Mitral valve prolapse Sister    Asthma Sister    Allergic rhinitis Sister    Mitral valve prolapse Son    Alcohol abuse Son    Diabetes Maternal Uncle    Diabetes Maternal Grandmother    Mitral valve prolapse Sister  CAD Son 76       No details available   Alcohol abuse Father    Alcohol abuse Brother     Social History Social History   Tobacco Use   Smoking status: Every Day    Packs/day: 0.50    Years: 12.00    Pack years: 6.00    Types: Cigarettes   Smokeless tobacco: Never  Vaping Use   Vaping Use: Never used  Substance Use Topics   Alcohol use: No    Alcohol/week: 0.0 standard drinks   Drug use: No     Review of Systems  Constitutional: No fever/chills Eyes: No visual changes. No discharge ENT: Positive for nasal congestion and right ear pain Cardiovascular: no substernal chest pain.  Positive for chest tightness Respiratory: Positive cough. No SOB. Gastrointestinal: No abdominal pain.  No nausea, no vomiting.  No diarrhea.  No constipation. Musculoskeletal: Negative for musculoskeletal pain. Skin: Negative for rash, abrasions, lacerations, ecchymosis. Neurological: Negative for headaches, focal weakness or numbness.  10 System ROS otherwise negative.  ____________________________________________   PHYSICAL EXAM:  VITAL SIGNS: ED Triage Vitals  Enc Vitals Group     BP 01/13/21 1624 120/76     Pulse Rate 01/13/21  1624 98     Resp 01/13/21 1624 17     Temp 01/13/21 1624 98.1 F (36.7 C)     Temp Source 01/13/21 1624 Oral     SpO2 01/13/21 1624 97 %     Weight --      Height --      Head Circumference --      Peak Flow --      Pain Score 01/13/21 1623 3     Pain Loc --      Pain Edu? --      Excl. in GC? --      Constitutional: Alert and oriented. Well appearing and in no acute distress. Eyes: Conjunctivae are normal. PERRL. EOMI. Head: Atraumatic. ENT:      Ears: EACs unremarkable bilaterally.  TM on right is injected and bulging.  TM on left is mildly bulging but not injected.      Nose: No congestion/rhinnorhea.      Mouth/Throat: Mucous membranes are moist.  Neck: No stridor.   Hematological/Lymphatic/Immunilogical: No cervical lymphadenopathy. Cardiovascular: Normal rate, regular rhythm. Normal S1 and S2.  Good peripheral circulation. Respiratory: Normal respiratory effort without tachypnea or retractions. Lungs with faint expiratory wheezing bilateral lower lung fields.  No inspiratory wheezing.  No rales or rhonchi.Peri Jefferson air entry to the bases with no decreased or absent breath sounds. Musculoskeletal: Full range of motion to all extremities. No gross deformities appreciated. Neurologic:  Normal speech and language. No gross focal neurologic deficits are appreciated.  Skin:  Skin is warm, dry and intact. No rash noted. Psychiatric: Mood and affect are normal. Speech and behavior are normal. Patient exhibits appropriate insight and judgement.   ____________________________________________   LABS (all labs ordered are listed, but only abnormal results are displayed)  Labs Reviewed  SARS CORONAVIRUS 2 (TAT 6-24 HRS)  POC INFLUENZA A AND B ANTIGEN (URGENT CARE ONLY)   ____________________________________________  EKG   ____________________________________________  RADIOLOGY I personally viewed and evaluated these images as part of my medical decision making, as well as  reviewing the written report by the radiologist.  ED Provider Interpretation: No acute findings on chest x-ray  DG Chest 2 View  Result Date: 01/13/2021 CLINICAL DATA:  Cough.  Chest pain.  Wheezing. EXAM:  CHEST - 2 VIEW COMPARISON:  06/25/2018 FINDINGS: Midline trachea. Normal heart size and mediastinal contours. No pleural effusion or pneumothorax. Clear lungs. IMPRESSION: No active cardiopulmonary disease. Electronically Signed   By: Jeronimo Greaves M.D.   On: 01/13/2021 17:01    ____________________________________________    PROCEDURES  Procedure(s) performed:    Procedures    Medications - No data to display   ____________________________________________   INITIAL IMPRESSION / ASSESSMENT AND PLAN / ED COURSE  Pertinent labs & imaging results that were available during my care of the patient were reviewed by me and considered in my medical decision making (see chart for details).  Review of the [place] CSRS was performed in accordance of the [facility] prior to dispensing any controlled drugs.           Patient's diagnosis is consistent with [facility] pneumonia.  Patient presents emergency department with 3 weeks of cough.  She had viral URI symptoms, these seem to improve for a day and then worsen.  Patient's primary concern is cough and right ear pain.  Patient is already been on a Z-Pak.  Chest x-ray was reassuring.  Flu was negative.  Have a low suspicion for COVID.  Patiently put on prednisone in case this is bronchitis, treated with Vantin as she has already been on a Z-Pak, is allergic to penicillins and fluoroquinolones..  Return precautions discussed with the patient.  Follow-up primary care.  Patient is given ED precautions to return to the ED for any worsening or new symptoms.     ____________________________________________  FINAL CLINICAL IMPRESSION(S) / ED DIAGNOSES  Final diagnoses:  Community acquired pneumonia, unspecified laterality      NEW MEDICATIONS  STARTED DURING THIS VISIT:  ED Discharge Orders          Ordered    cefpodoxime (VANTIN) 200 MG tablet  2 times daily        01/13/21 1723    predniSONE (DELTASONE) 50 MG tablet  Daily with breakfast        01/13/21 1723    fluticasone (FLONASE) 50 MCG/ACT nasal spray  2 times daily        01/13/21 1723                This chart was dictated using voice recognition software/Dragon. Despite best efforts to proofread, errors can occur which can change the meaning. Any change was purely unintentional.    Racheal Patches, PA-C 01/13/21 1725

## 2021-01-14 LAB — SARS CORONAVIRUS 2 (TAT 6-24 HRS): SARS Coronavirus 2: NEGATIVE

## 2021-03-02 ENCOUNTER — Other Ambulatory Visit: Payer: Self-pay | Admitting: Family Medicine

## 2021-03-02 ENCOUNTER — Other Ambulatory Visit: Payer: Self-pay | Admitting: Critical Care Medicine

## 2021-03-02 DIAGNOSIS — G894 Chronic pain syndrome: Secondary | ICD-10-CM

## 2021-03-02 NOTE — Telephone Encounter (Signed)
Requested medications are due for refill today.  yes  Requested medications are on the active medications list.  yes  Last refill. 05/19/2020  Future visit scheduled.   no  Notes to clinic.  Medication not delegated.    Requested Prescriptions  Pending Prescriptions Disp Refills   cyclobenzaprine (FLEXERIL) 10 MG tablet [Pharmacy Med Name: CYCLOBENZAPRINE 10 MG TABLET] 90 tablet 5    Sig: TAKE 1 TABLET BY MOUTH THREE TIMES A DAY AS NEEDED FOR MUSCLE SPASMS     Not Delegated - Analgesics:  Muscle Relaxants Failed - 03/02/2021  2:54 PM      Failed - This refill cannot be delegated      Passed - Valid encounter within last 6 months    Recent Outpatient Visits           2 months ago Upper respiratory tract infection, unspecified type   [facility] [place], [place], [place]   4 months ago ASCUS of cervix with negative high risk HPV   [facility] Hoy Register, MD   7 months ago Restless leg syndrome   [facility] Hoy Register, MD   1 year ago Hyperthyroidism   [facility] Hoy Register, MD   1 year ago Colon cancer screening   [place] [facility] Hoy Register, MD

## 2021-03-02 NOTE — Telephone Encounter (Signed)
Requested Prescriptions  Pending Prescriptions Disp Refills   gabapentin (NEURONTIN) 300 MG capsule [Pharmacy Med Name: GABAPENTIN 300 MG CAPSULE] 90 capsule 1    Sig: TAKE 1 CAPSULE BY MOUTH EVERYDAY AT BEDTIME     Neurology: Anticonvulsants - gabapentin Passed - 03/02/2021  2:54 PM      Passed - Valid encounter within last 12 months    Recent Outpatient Visits          2 months ago Upper respiratory tract infection, unspecified type   [place], [place], [place]   4 months ago ASCUS of cervix with negative high risk HPV   [place], [place], MD   7 months ago Restless leg syndrome   [place], En[place]ong, MD   1 year ago Hyperthyroidism   [place], Enobong, MD   1 year ago Colon cancer screening   [facility] Charlott Rakes, MD

## 2021-03-12 ENCOUNTER — Other Ambulatory Visit: Payer: Self-pay | Admitting: Cardiology

## 2021-03-18 ENCOUNTER — Encounter: Payer: Self-pay | Admitting: Family Medicine

## 2021-03-18 ENCOUNTER — Other Ambulatory Visit: Payer: Self-pay | Admitting: Family Medicine

## 2021-03-18 DIAGNOSIS — G894 Chronic pain syndrome: Secondary | ICD-10-CM

## 2021-03-18 DIAGNOSIS — J42 Unspecified chronic bronchitis: Secondary | ICD-10-CM

## 2021-03-18 DIAGNOSIS — G2581 Restless legs syndrome: Secondary | ICD-10-CM

## 2021-03-18 MED ORDER — ROPINIROLE HCL ER 2 MG PO TB24: 2.0000 mg | ORAL_TABLET | Freq: Every day | ORAL | 0 refills | Status: DC

## 2021-03-18 MED ORDER — CYCLOBENZAPRINE HCL 10 MG PO TABS: ORAL_TABLET | ORAL | 1 refills | Status: AC

## 2021-03-18 MED ORDER — ALBUTEROL SULFATE (2.5 MG/3ML) 0.083% IN NEBU: 2.5000 mg | INHALATION_SOLUTION | Freq: Four times a day (QID) | RESPIRATORY_TRACT | 1 refills | Status: AC | PRN

## 2021-03-18 MED ORDER — GABAPENTIN 300 MG PO CAPS: 300.0000 mg | ORAL_CAPSULE | Freq: Every day | ORAL | 0 refills | Status: AC

## 2021-07-17 ENCOUNTER — Other Ambulatory Visit: Payer: Self-pay | Admitting: Pharmacist

## 2021-07-17 DIAGNOSIS — G2581 Restless legs syndrome: Secondary | ICD-10-CM

## 2021-07-17 MED ORDER — ROPINIROLE HCL ER 2 MG PO TB24: 2.0000 mg | ORAL_TABLET | Freq: Every day | ORAL | 0 refills | Status: AC

## 2021-08-18 ENCOUNTER — Encounter: Payer: Self-pay | Admitting: Cardiology

## 2021-09-23 ENCOUNTER — Telehealth: Payer: Self-pay | Admitting: Emergency Medicine

## 2021-09-23 NOTE — Telephone Encounter (Signed)
Copied from CRM [id number]. Topic: General - Inquiry >> Sep 23, 2021  3:23 PM De Blanch wrote: Reason for CRM: Shanda Bumps from [facility] at [facility] stated will be faxing release of records as pt will be seen there now.

## 2021-09-25 NOTE — Telephone Encounter (Signed)
FYI

## 2021-10-22 IMAGING — MR MR KNEE*R* W/O CM
6 series · 38 of 40 positions shown · non-contrast
Comparison: None.

CLINICAL DATA: Six-month history of knee pain.

EXAM:
MRI OF THE RIGHT KNEE WITHOUT CONTRAST
TECHNIQUE: Multiplanar, multisequence MR imaging of the knee was performed. No
intravenous contrast was administered.

[Series 6: T2 fat-sat · axial · right · 4.0mm · 0.50mm/px · z∈[-88,+65]mm · 8 of 36 slices shown (1 of 3)]
[im 1/36]
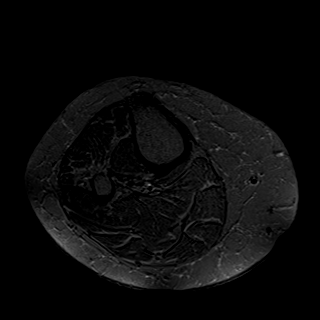
[im 6/36]
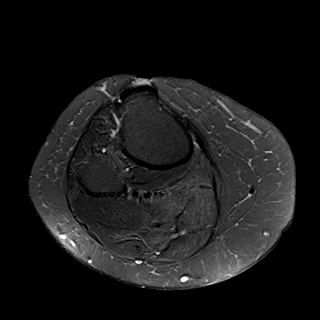
[im 11/36]
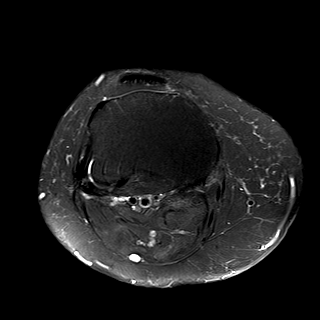
[im 16/36]
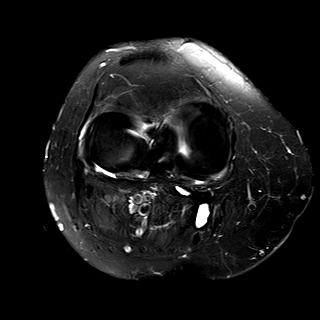
[im 21/36]
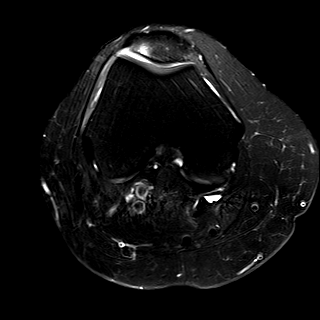
[im 26/36]
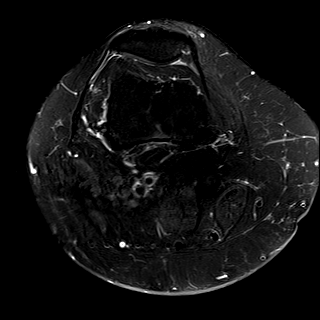
[im 31/36]
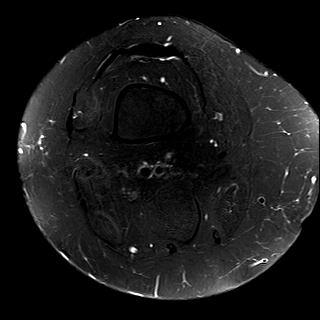
[im 36/36]
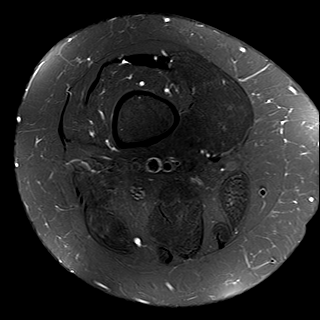

[Series 7: T2 fat-sat · coronal · right · 4.0mm · 0.39mm/px · 6 of 28 slices shown (2 of 3)]
[im 1/28]
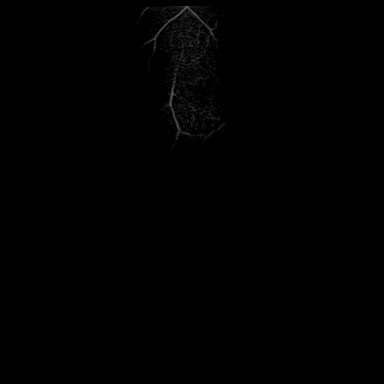
[im 6/28]
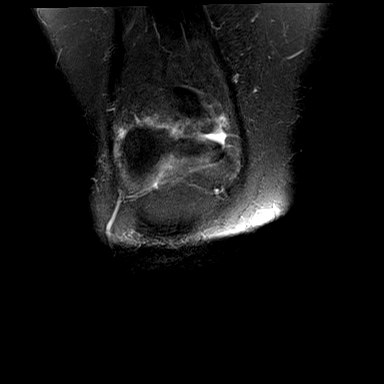
[im 11/28]
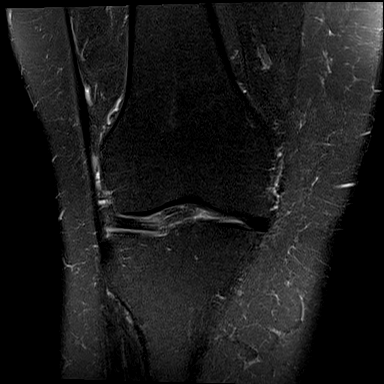
[im 17/28]
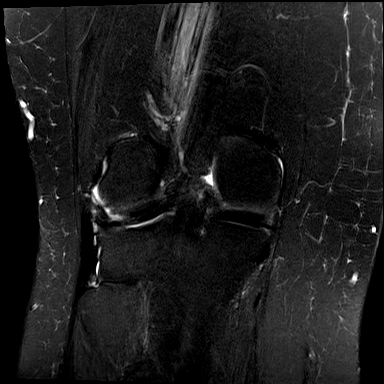
[im 22/28]
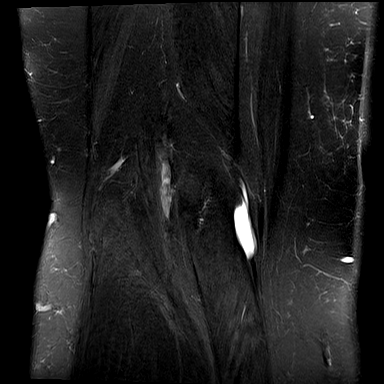
[im 28/28]
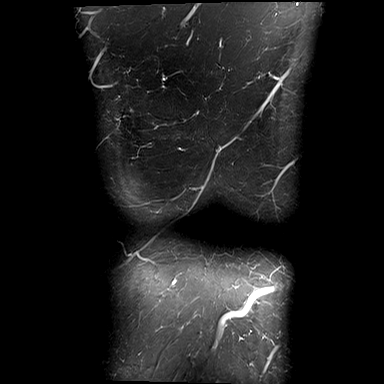

[Series 8: T1 · coronal · right · 4.0mm · 0.39mm/px · 4 of 28 slices shown]
[im 1/28]
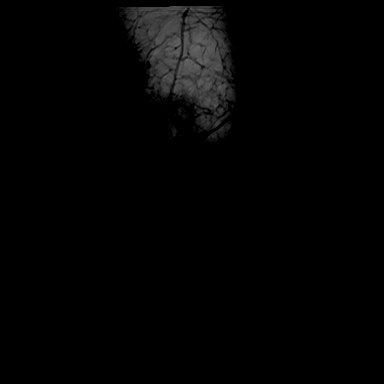
[im 6/28]
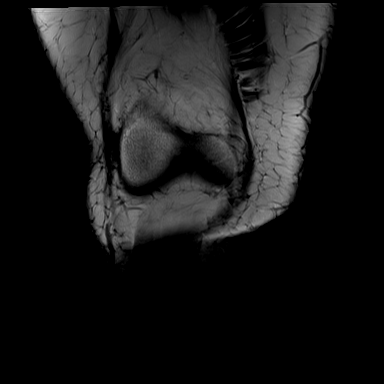
[im 11/28]
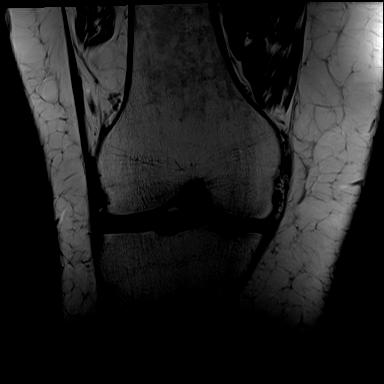
[im 17/28]
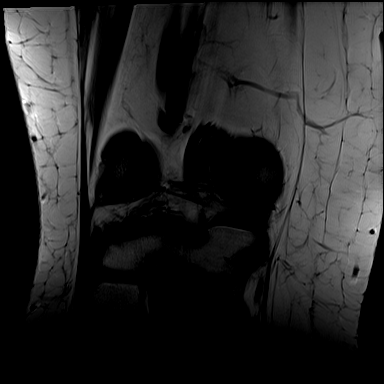

[Series 10: PD fat-sat · sagittal · right · 3.0mm · 0.39mm/px · 6 of 27 slices shown (1 of 2)]
[im 1/27]
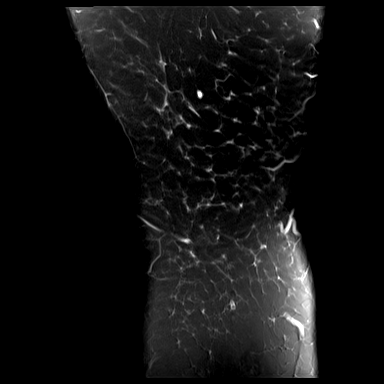
[im 6/27]
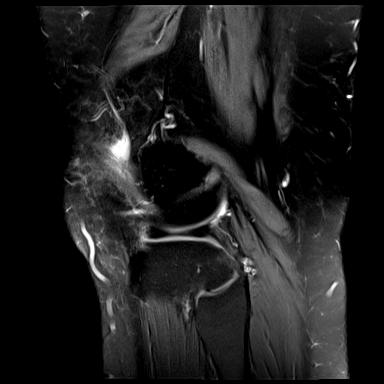
[im 11/27]
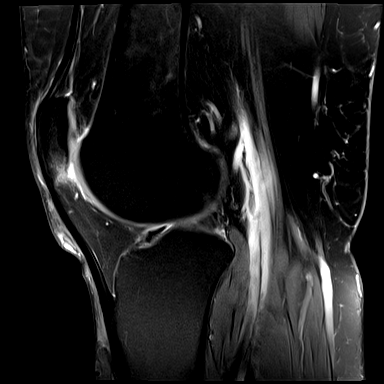
[im 16/27]
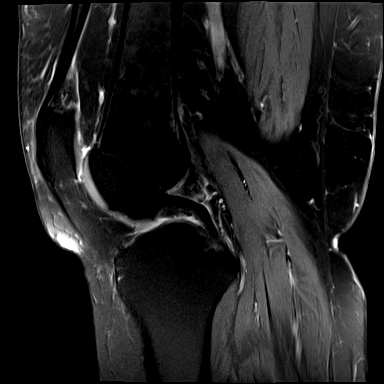
[im 21/27]
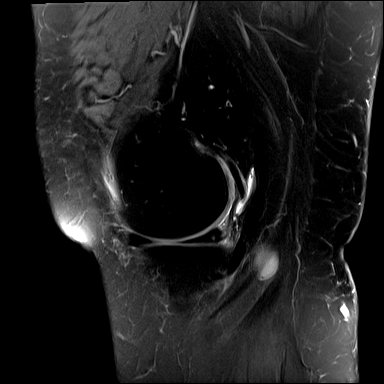
[im 27/27]
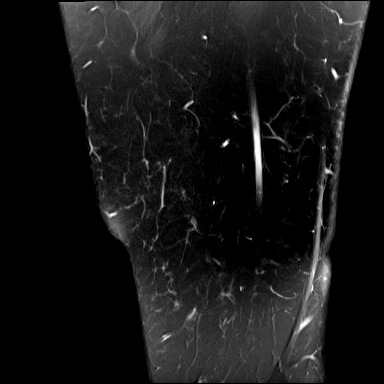

[Series 11: T2 fat-sat · sagittal · right · 3.0mm · 0.39mm/px · 6 of 27 slices shown (3 of 3)]
[im 1/27]
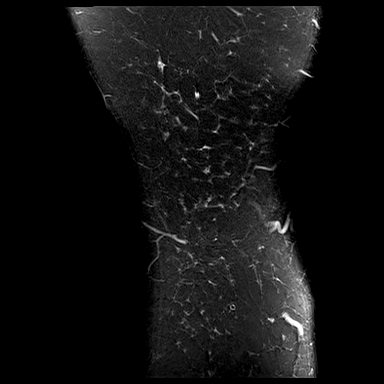
[im 6/27]
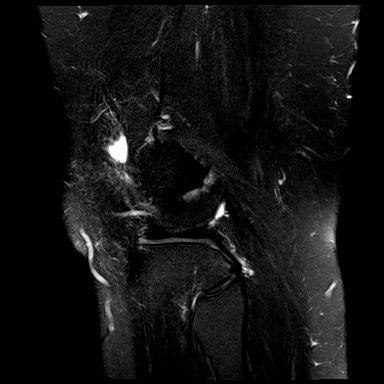
[im 11/27]
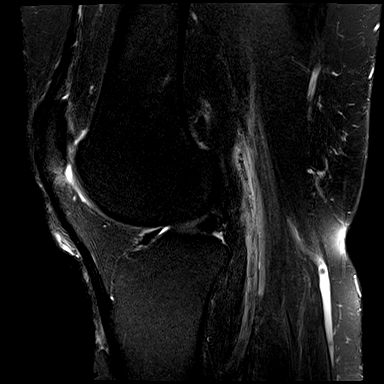
[im 16/27]
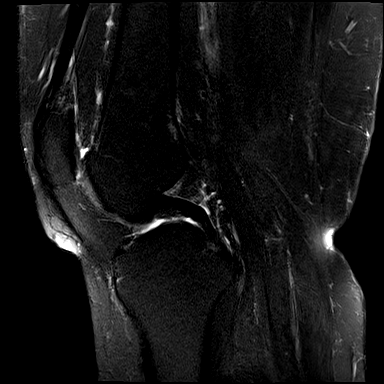
[im 21/27]
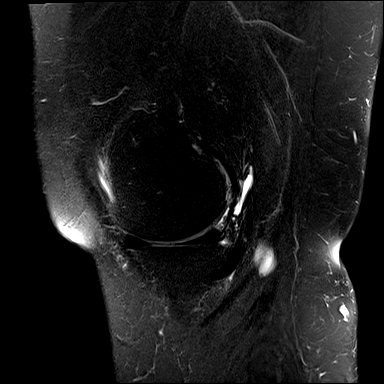
[im 27/27]
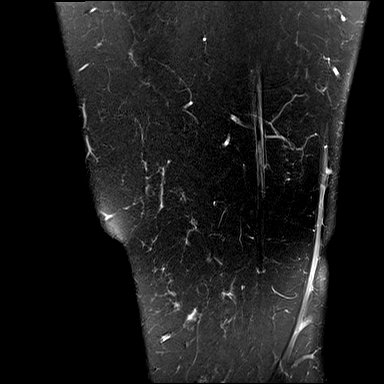

[Series 12: PD fat-sat · coronal · right · 3.0mm · 0.47mm/px · 8 of 34 slices shown (2 of 2)]
[im 1/34]
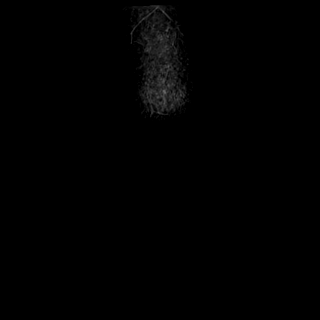
[im 5/34]
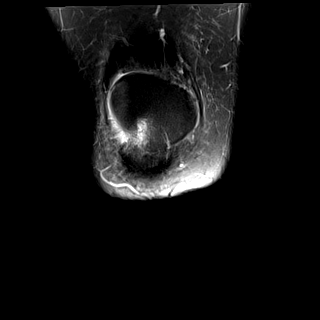
[im 10/34]
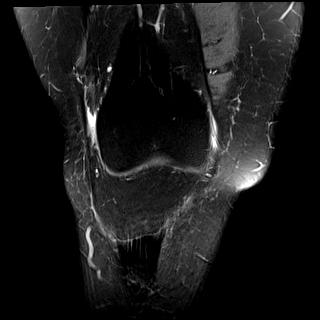
[im 15/34]
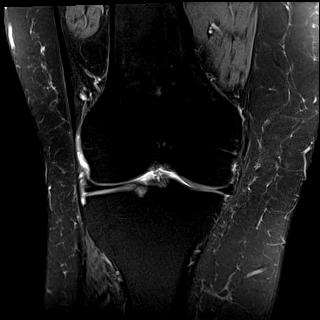
[im 19/34]
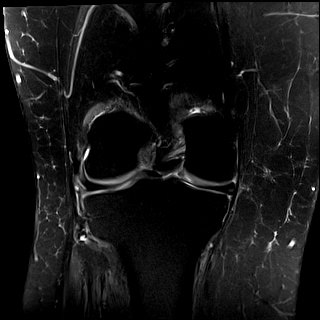
[im 24/34]
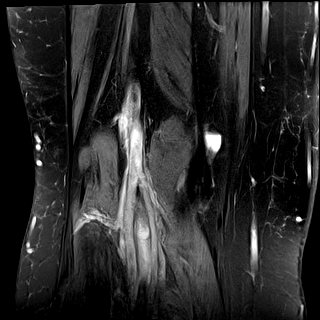
[im 29/34]
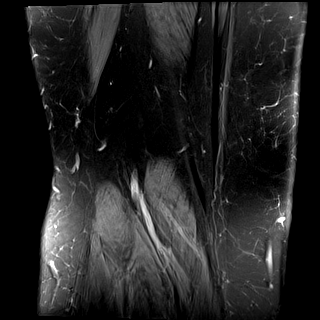
[im 34/34]
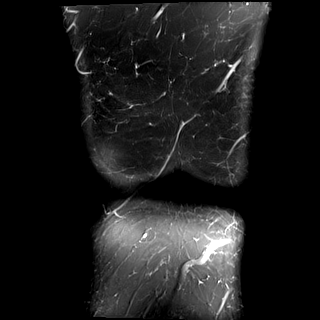

[38 of 40 positions shown; findings below may reference images not displayed]

FINDINGS: MENISCI

Medial meniscus: Intrasubstance degenerative type signal changes and
some free edge fraying but no discrete meniscal tear.

Lateral meniscus: Degenerative changes and probable small free edge
surface tears. Meniscal morphology is maintained.

LIGAMENTS

Cruciates:  Intact

Collaterals:  Intact

CARTILAGE

Patellofemoral: Moderate degenerative chondrosis with focal area of
moderate cartilage thinning and subchondral cystic change involving
the lateral patellar facet. Some surrounding marrow edema.

Medial:  Mild degenerative chondrosis.

Lateral:  Mild to moderate degenerative chondrosis.

Joint:  Small amount of joint fluid but no overt joint effusion.

Popliteal Fossa:  Small Baker's cyst.

Extensor Mechanism: The patella retinacular structures are intact
and the quadriceps and patellar tendons are intact.

Bones: No acute bony findings. No stress fracture or osteochondral
lesion.

Other: Normal knee musculature.
IMPRESSION: 1. Degenerative meniscal changes without definite tear.
2. Intact ligamentous structures and no acute bony findings.
3. Tricompartmental degenerative changes most significant in the
lateral patellofemoral joint.
4. Small Baker's cyst.
